# Patient Record
Sex: Female | Born: 1955 | Race: White | Hispanic: No | Marital: Married | Smoking: Former smoker
Health system: Southern US, Community
[De-identification: ages and names within clinical notes are randomized; demographics above are authoritative.]

## PROBLEM LIST (undated history)

## (undated) DIAGNOSIS — Z872 Personal history of diseases of the skin and subcutaneous tissue: Secondary | ICD-10-CM

## (undated) DIAGNOSIS — D219 Benign neoplasm of connective and other soft tissue, unspecified: Secondary | ICD-10-CM

## (undated) DIAGNOSIS — B009 Herpesviral infection, unspecified: Secondary | ICD-10-CM

## (undated) DIAGNOSIS — M1990 Unspecified osteoarthritis, unspecified site: Secondary | ICD-10-CM

## (undated) DIAGNOSIS — K589 Irritable bowel syndrome without diarrhea: Secondary | ICD-10-CM

## (undated) DIAGNOSIS — D229 Melanocytic nevi, unspecified: Secondary | ICD-10-CM

## (undated) DIAGNOSIS — N3010 Interstitial cystitis (chronic) without hematuria: Secondary | ICD-10-CM

## (undated) DIAGNOSIS — R7303 Prediabetes: Secondary | ICD-10-CM

## (undated) DIAGNOSIS — C801 Malignant (primary) neoplasm, unspecified: Secondary | ICD-10-CM

## (undated) DIAGNOSIS — Z87442 Personal history of urinary calculi: Secondary | ICD-10-CM

## (undated) DIAGNOSIS — T7840XA Allergy, unspecified, initial encounter: Secondary | ICD-10-CM

## (undated) DIAGNOSIS — N200 Calculus of kidney: Secondary | ICD-10-CM

## (undated) DIAGNOSIS — G43909 Migraine, unspecified, not intractable, without status migrainosus: Secondary | ICD-10-CM

## (undated) DIAGNOSIS — K759 Inflammatory liver disease, unspecified: Secondary | ICD-10-CM

## (undated) DIAGNOSIS — K602 Anal fissure, unspecified: Secondary | ICD-10-CM

## (undated) DIAGNOSIS — F419 Anxiety disorder, unspecified: Secondary | ICD-10-CM

## (undated) DIAGNOSIS — E785 Hyperlipidemia, unspecified: Secondary | ICD-10-CM

## (undated) DIAGNOSIS — K219 Gastro-esophageal reflux disease without esophagitis: Secondary | ICD-10-CM

## (undated) HISTORY — DX: Calculus of kidney: N20.0

## (undated) HISTORY — DX: Anxiety disorder, unspecified: F41.9

## (undated) HISTORY — DX: Personal history of diseases of the skin and subcutaneous tissue: Z87.2

## (undated) HISTORY — DX: Hyperlipidemia, unspecified: E78.5

## (undated) HISTORY — DX: Melanocytic nevi, unspecified: D22.9

## (undated) HISTORY — DX: Herpesviral infection, unspecified: B00.9

## (undated) HISTORY — PX: LITHOTRIPSY: SUR834

## (undated) HISTORY — PX: UPPER GASTROINTESTINAL ENDOSCOPY: SHX188

## (undated) HISTORY — PX: BLADDER REPAIR: SHX76

## (undated) HISTORY — DX: Prediabetes: R73.03

## (undated) HISTORY — DX: Gastro-esophageal reflux disease without esophagitis: K21.9

## (undated) HISTORY — DX: Benign neoplasm of connective and other soft tissue, unspecified: D21.9

## (undated) HISTORY — PX: LAPAROSCOPY: SHX197

## (undated) HISTORY — DX: Unspecified osteoarthritis, unspecified site: M19.90

## (undated) HISTORY — DX: Interstitial cystitis (chronic) without hematuria: N30.10

## (undated) HISTORY — DX: Allergy, unspecified, initial encounter: T78.40XA

## (undated) HISTORY — PX: OTHER SURGICAL HISTORY: SHX169

## (undated) HISTORY — PX: COLONOSCOPY: SHX174

## (undated) HISTORY — PX: BUNIONECTOMY: SHX129

## (undated) HISTORY — DX: Migraine, unspecified, not intractable, without status migrainosus: G43.909

## (undated) HISTORY — DX: Irritable bowel syndrome, unspecified: K58.9

## (undated) HISTORY — DX: Anal fissure, unspecified: K60.2

## (undated) HISTORY — PX: CHOLECYSTECTOMY: SHX55

## (undated) HISTORY — PX: TONSILLECTOMY: SUR1361

---

## 1990-04-26 ENCOUNTER — Encounter: Payer: Self-pay | Admitting: Internal Medicine

## 1990-11-13 ENCOUNTER — Encounter: Payer: Self-pay | Admitting: Internal Medicine

## 1990-11-15 ENCOUNTER — Encounter: Payer: Self-pay | Admitting: Internal Medicine

## 1991-09-11 ENCOUNTER — Encounter: Payer: Self-pay | Admitting: Internal Medicine

## 1991-10-19 ENCOUNTER — Encounter: Payer: Self-pay | Admitting: Internal Medicine

## 1991-10-26 ENCOUNTER — Encounter: Payer: Self-pay | Admitting: Internal Medicine

## 1992-03-26 ENCOUNTER — Encounter: Payer: Self-pay | Admitting: Internal Medicine

## 1994-01-28 ENCOUNTER — Encounter: Payer: Self-pay | Admitting: Internal Medicine

## 1994-02-08 ENCOUNTER — Encounter: Payer: Self-pay | Admitting: Internal Medicine

## 1994-02-15 ENCOUNTER — Encounter: Payer: Self-pay | Admitting: Internal Medicine

## 1995-01-05 ENCOUNTER — Encounter: Payer: Self-pay | Admitting: Internal Medicine

## 1995-01-21 ENCOUNTER — Encounter: Payer: Self-pay | Admitting: Internal Medicine

## 1995-02-24 ENCOUNTER — Encounter: Payer: Self-pay | Admitting: Internal Medicine

## 1995-03-14 ENCOUNTER — Encounter: Payer: Self-pay | Admitting: Internal Medicine

## 1995-04-08 ENCOUNTER — Encounter: Payer: Self-pay | Admitting: Internal Medicine

## 1996-05-30 ENCOUNTER — Encounter: Payer: Self-pay | Admitting: Internal Medicine

## 1996-08-30 ENCOUNTER — Encounter: Payer: Self-pay | Admitting: Internal Medicine

## 1997-07-18 ENCOUNTER — Encounter: Payer: Self-pay | Admitting: Internal Medicine

## 1997-08-26 ENCOUNTER — Encounter: Payer: Self-pay | Admitting: Internal Medicine

## 1997-10-30 ENCOUNTER — Encounter: Payer: Self-pay | Admitting: Internal Medicine

## 1997-11-07 ENCOUNTER — Encounter: Payer: Self-pay | Admitting: Internal Medicine

## 1997-11-14 ENCOUNTER — Encounter: Payer: Self-pay | Admitting: Internal Medicine

## 1997-11-25 ENCOUNTER — Encounter: Payer: Self-pay | Admitting: Internal Medicine

## 1999-07-08 ENCOUNTER — Encounter: Payer: Self-pay | Admitting: Obstetrics and Gynecology

## 1999-07-08 ENCOUNTER — Encounter: Admission: RE | Admit: 1999-07-08 | Discharge: 1999-07-08 | Payer: Self-pay | Admitting: Obstetrics and Gynecology

## 1999-07-08 ENCOUNTER — Encounter (INDEPENDENT_AMBULATORY_CARE_PROVIDER_SITE_OTHER): Payer: Self-pay | Admitting: *Deleted

## 1999-09-03 ENCOUNTER — Ambulatory Visit (HOSPITAL_COMMUNITY): Admission: RE | Admit: 1999-09-03 | Discharge: 1999-09-03 | Payer: Self-pay | Admitting: *Deleted

## 2001-09-27 ENCOUNTER — Ambulatory Visit (HOSPITAL_COMMUNITY): Admission: RE | Admit: 2001-09-27 | Discharge: 2001-09-27 | Payer: Self-pay | Admitting: Internal Medicine

## 2001-09-27 ENCOUNTER — Encounter: Payer: Self-pay | Admitting: Internal Medicine

## 2001-11-23 ENCOUNTER — Encounter: Payer: Self-pay | Admitting: Internal Medicine

## 2002-06-08 ENCOUNTER — Encounter (INDEPENDENT_AMBULATORY_CARE_PROVIDER_SITE_OTHER): Payer: Self-pay | Admitting: *Deleted

## 2002-06-08 ENCOUNTER — Ambulatory Visit (HOSPITAL_COMMUNITY): Admission: RE | Admit: 2002-06-08 | Discharge: 2002-06-08 | Payer: Self-pay | Admitting: Gastroenterology

## 2002-10-02 ENCOUNTER — Ambulatory Visit (HOSPITAL_COMMUNITY): Admission: RE | Admit: 2002-10-02 | Discharge: 2002-10-02 | Payer: Self-pay | Admitting: Internal Medicine

## 2002-10-02 ENCOUNTER — Encounter: Payer: Self-pay | Admitting: Internal Medicine

## 2003-10-09 ENCOUNTER — Ambulatory Visit (HOSPITAL_COMMUNITY): Admission: RE | Admit: 2003-10-09 | Discharge: 2003-10-09 | Payer: Self-pay | Admitting: Internal Medicine

## 2004-10-13 ENCOUNTER — Ambulatory Visit (HOSPITAL_COMMUNITY): Admission: RE | Admit: 2004-10-13 | Discharge: 2004-10-13 | Payer: Self-pay | Admitting: Internal Medicine

## 2005-10-28 ENCOUNTER — Ambulatory Visit (HOSPITAL_COMMUNITY): Admission: RE | Admit: 2005-10-28 | Discharge: 2005-10-28 | Payer: Self-pay | Admitting: Internal Medicine

## 2006-03-23 ENCOUNTER — Ambulatory Visit (HOSPITAL_COMMUNITY): Admission: RE | Admit: 2006-03-23 | Discharge: 2006-03-23 | Payer: Self-pay | Admitting: Internal Medicine

## 2006-11-11 ENCOUNTER — Ambulatory Visit (HOSPITAL_COMMUNITY): Admission: RE | Admit: 2006-11-11 | Discharge: 2006-11-11 | Payer: Self-pay | Admitting: Internal Medicine

## 2007-09-14 ENCOUNTER — Other Ambulatory Visit: Admission: RE | Admit: 2007-09-14 | Discharge: 2007-09-14 | Payer: Self-pay | Admitting: Obstetrics & Gynecology

## 2007-12-06 ENCOUNTER — Ambulatory Visit (HOSPITAL_COMMUNITY): Admission: RE | Admit: 2007-12-06 | Discharge: 2007-12-06 | Payer: Self-pay | Admitting: Internal Medicine

## 2008-09-24 ENCOUNTER — Ambulatory Visit: Payer: Self-pay | Admitting: Internal Medicine

## 2008-09-24 DIAGNOSIS — F411 Generalized anxiety disorder: Secondary | ICD-10-CM | POA: Insufficient documentation

## 2008-09-24 DIAGNOSIS — Z8719 Personal history of other diseases of the digestive system: Secondary | ICD-10-CM | POA: Insufficient documentation

## 2008-09-24 DIAGNOSIS — Z872 Personal history of diseases of the skin and subcutaneous tissue: Secondary | ICD-10-CM | POA: Insufficient documentation

## 2008-09-24 HISTORY — DX: Personal history of diseases of the skin and subcutaneous tissue: Z87.2

## 2008-09-24 HISTORY — DX: Generalized anxiety disorder: F41.1

## 2009-01-25 HISTORY — PX: RETROPUBIC SLING: SHX2343

## 2009-02-06 ENCOUNTER — Ambulatory Visit (HOSPITAL_BASED_OUTPATIENT_CLINIC_OR_DEPARTMENT_OTHER): Admission: RE | Admit: 2009-02-06 | Discharge: 2009-02-06 | Payer: Self-pay | Admitting: Urology

## 2009-10-14 ENCOUNTER — Ambulatory Visit (HOSPITAL_BASED_OUTPATIENT_CLINIC_OR_DEPARTMENT_OTHER): Admission: RE | Admit: 2009-10-14 | Discharge: 2009-10-14 | Payer: Self-pay | Admitting: Urology

## 2010-02-06 ENCOUNTER — Ambulatory Visit
Admission: RE | Admit: 2010-02-06 | Discharge: 2010-02-06 | Payer: Self-pay | Source: Home / Self Care | Attending: Internal Medicine | Admitting: Internal Medicine

## 2010-02-06 DIAGNOSIS — K5900 Constipation, unspecified: Secondary | ICD-10-CM

## 2010-02-06 DIAGNOSIS — R143 Flatulence: Secondary | ICD-10-CM

## 2010-02-06 DIAGNOSIS — R142 Eructation: Secondary | ICD-10-CM

## 2010-02-06 DIAGNOSIS — R141 Gas pain: Secondary | ICD-10-CM | POA: Insufficient documentation

## 2010-02-06 HISTORY — DX: Constipation, unspecified: K59.00

## 2010-02-26 NOTE — Assessment & Plan Note (Signed)
Summary: f/u--ch.   History of Present Illness Visit Type: Follow-up Visit Primary GI MD: Lina Sar MD Primary Provider:  Marisue Brooklyn, DO Requesting Provider: n/a Chief Complaint: Pt c/o constipation and bloating  History of Present Illness:   This is a 55 year old white female with irritable bowel syndrome. Her last appointment was in August 2010. She has been symptomatically improved on Paxil 10 mg a day. However, she has gained about 8 pounds. Her main complaint is constipation for which she takes stool softeners around 3 times a week. She denies rectal bleeding. Her last colonoscopy was in May 2004. A recall colonoscopy will be due in May 2014.   GI Review of Systems    Reports bloating.      Denies abdominal pain, acid reflux, belching, chest pain, dysphagia with liquids, dysphagia with solids, heartburn, loss of appetite, nausea, vomiting, vomiting blood, weight loss, and  weight gain.      Reports constipation.     Denies anal fissure, black tarry stools, change in bowel habit, diarrhea, diverticulosis, fecal incontinence, heme positive stool, hemorrhoids, irritable bowel syndrome, jaundice, light color stool, liver problems, rectal bleeding, and  rectal pain.    Current Medications (verified): 1)  Crestor 10 Mg Tabs (Rosuvastatin Calcium) .... One Tablet By Mouth Everyother Day 2)  Vitamin E 400 Unit Caps (Vitamin E) .... One Tablet By Mouth Once Daily 3)  Vitamin D3 2000 Unit Caps (Cholecalciferol) .... One Tablet By Mouth Once Daily 4)  Levsin/sl 0.125 Mg Subl (Hyoscyamine Sulfate) .... Dissolve 1 Tablet Under The Tongue Every 4 Hours As Needed 5)  Paxil 10 Mg Tabs (Paroxetine Hcl) .... Take 1 Tablet By Mouth Once A Day 6)  Valtrex 500 Mg Tabs (Valacyclovir Hcl) .... 1/2-1 Tablet By Mouth Once Daily 7)  Stool Softener 250 Mg Caps (Docusate Sodium) .... As Needed 8)  Voltaren 1 % Gel (Diclofenac Sodium) .... As Needed 9)  Gelnique 10 % Gel (Oxybutynin Chloride) .... As  Needed  Allergies (verified): 1)  ! Cipro  Past History:  Past Medical History: ABDOMINAL BLOATING (ICD-787.3) CONSTIPATION (ICD-564.00) IRRITABLE BOWEL SYNDROME (ICD-564.1) HYPERLIPIDEMIA (ICD-272.4) HX OF GALLSTONE (ICD-V12.79) ANXIETY (ICD-300.00) ANAL FISSURE, HX OF (ICD-V13.3)  Past Surgical History: Cholecystectomy Tubal Reconstruction Bladder Repair Tonsillectomy  Family History: Family History of Diabetes: PGM Family History of Breast Cancer:PGM Family History of Pancreatic Cancer:MGM Lung Cancer: MGM Family History of Celiac Disease:Mother  Family History of Irritable Bowel Syndrome:Sister  No FH of Colon Cancer:  Social History: Reviewed history from 09/24/2008 and no changes required. Occupation: Optometrist Married 2 girls  Patient is a former smoker.  Alcohol Use - yes: social only  Daily Caffeine Use: 2 caffine  Illicit Drug Use - no  Review of Systems       The patient complains of back pain and fatigue.  The patient denies allergy/sinus, anemia, anxiety-new, arthritis/joint pain, blood in urine, breast changes/lumps, change in vision, confusion, cough, coughing up blood, depression-new, fainting, fever, headaches-new, hearing problems, heart murmur, heart rhythm changes, itching, menstrual pain, muscle pains/cramps, night sweats, nosebleeds, pregnancy symptoms, shortness of breath, skin rash, sleeping problems, sore throat, swelling of feet/legs, swollen lymph glands, thirst - excessive , urination - excessive , urination changes/pain, urine leakage, vision changes, and voice change.         Pertinent positive and negative review of systems were noted in the above HPI. All other ROS was otherwise negative.   Vital Signs:  Patient profile:   55 year old  female Height:      64 inches Weight:      131 pounds BMI:     22.57 BSA:     1.64 Pulse rate:   88 / minute Pulse rhythm:   regular BP sitting:   120 / 64  (left  arm) Cuff size:   regular  Vitals Entered By: Ok Anis CMA (February 06, 2010 3:37 PM)  Physical Exam  General:  Well developed, well nourished, no acute distress. Mouth:  No deformity or lesions, dentition normal. Lungs:  Clear throughout to auscultation. Heart:  Regular rate and rhythm; no murmurs, rubs,  or bruits. Abdomen:  Soft, nontender and nondistended. No masses, hepatosplenomegaly or hernias noted. Normal bowel sounds. Rectal:  formed  Hemoccult-negative stool Extremities:  No clubbing, cyanosis, edema or deformities noted. Skin:  Intact without significant lesions or rashes. Psych:  Alert and cooperative. Normal mood and affect.   Impression & Recommendations:  Problem # 1:  ABDOMINAL BLOATING (ICD-787.3) Patient has irritable bowel syndrome with predominant constipation. She has symptomatically improved on Paxil 10 mg daily. We will refill for another year. Dr. Elisabeth Most may refill her Paxil in the future. She needs to stay on a high-fiber diet. A recall colonoscopy will be due in May 2014.  Problem # 2:  CONSTIPATION (ICD-564.00) Patient should continue stool softener. She may use this on a daily basis. I offered MiraLax. She is to continue stool softeners.  Patient Instructions: 1)  high-fiber diet. 2)  Continue stool softener with a mild stimulant. 3)  As an alternative, he may use MiraLax 9-17 g 3 times a week. 4)  Recall colonoscopy May 2014. 5)  Refill Paxil 10 mg daily. This may be refilled by Dr. Hardie Pulley office in the future. 6)  Copy sent to : Dr A.Stevenson 7)  The medication list was reviewed and reconciled.  All changed / newly prescribed medications were explained.  A complete medication list was provided to the patient / caregiver. Prescriptions: LEVSIN/SL 0.125 MG SUBL (HYOSCYAMINE SULFATE) Dissolve 1 tablet under the tongue every 4 hours as needed  #60 x 11   Entered by:   Lamona Curl CMA (AAMA)   Authorized by:   Hart Carwin MD    Signed by:   Hart Carwin MD on 02/06/2010   Method used:   Electronically to        [facility] [phone]* (retail)       [address]       [place], [place]  [address]       Ph: [phone] or [phone]       Fax: [phone]   RxID:   [id number] PAXIL 10 MG TABS (PAROXETINE HCL) Take 1 tablet by mouth once a day  #30 x 11   Entered by:   Lamona Curl CMA (AAMA)   Authorized by:   Hart Carwin MD   Signed by:   Lamona Curl CMA (AAMA) on 02/06/2010   Method used:   Electronically to        [facility] [phone]* (retail)       [address]       [place], [place]  [address]       Ph: [phone] or [phone]       Fax: [phone]   RxID:   [id number]

## 2010-04-09 LAB — TYPE AND SCREEN
ABO/RH(D): A NEG
Antibody Screen: NEGATIVE

## 2010-04-09 LAB — CBC
HCT: 39.4 % (ref 36.0–46.0)
MCV: 92.3 fL (ref 78.0–100.0)
Platelets: 204 10*3/uL (ref 150–400)
RBC: 4.27 MIL/uL (ref 3.87–5.11)
RDW: 12.9 % (ref 11.5–15.5)

## 2010-04-09 LAB — PROTIME-INR: INR: 1.05 (ref 0.00–1.49)

## 2010-04-12 LAB — POCT PREGNANCY, URINE: Preg Test, Ur: NEGATIVE

## 2011-05-18 ENCOUNTER — Ambulatory Visit (INDEPENDENT_AMBULATORY_CARE_PROVIDER_SITE_OTHER): Payer: 59 | Admitting: Internal Medicine

## 2011-05-18 VITALS — BP 100/62 | HR 59 | Temp 97.9°F | Resp 18 | Ht 63.5 in | Wt 110.6 lb

## 2011-05-18 DIAGNOSIS — R109 Unspecified abdominal pain: Secondary | ICD-10-CM

## 2011-05-18 DIAGNOSIS — K5900 Constipation, unspecified: Secondary | ICD-10-CM

## 2011-05-18 DIAGNOSIS — N390 Urinary tract infection, site not specified: Secondary | ICD-10-CM

## 2011-05-18 LAB — POCT URINALYSIS DIPSTICK
Bilirubin, UA: NEGATIVE
Ketones, UA: NEGATIVE
Nitrite, UA: NEGATIVE
Spec Grav, UA: 1.02
Urobilinogen, UA: 0.2

## 2011-05-18 LAB — POCT UA - MICROSCOPIC ONLY: Mucus, UA: NEGATIVE

## 2011-05-18 MED ORDER — NITROFURANTOIN MONOHYD MACRO 100 MG PO CAPS: 100.0000 mg | ORAL_CAPSULE | Freq: Two times a day (BID) | ORAL | Status: DC

## 2011-05-18 MED ORDER — SULFAMETHOXAZOLE-TRIMETHOPRIM 800-160 MG PO TABS: 1.0000 | ORAL_TABLET | Freq: Two times a day (BID) | ORAL | Status: AC

## 2011-05-18 NOTE — Progress Notes (Signed)
  Subjective:    Patient ID: Patricia Ryan, female    DOB: 1955/12/06, 56 y.o.   MRN: [id number]  HPI burning with urination for 3 days.moderate in severity no back pain.  No nausea of vomiting.  No fever. Started to take septra on her own but now with spasms and some pain in the abdomen after exercising yesterday.  No fever no vomiting  has chronic constipation the last time she had a bm was 1 week ago. Has bladder sling No vaginal discharge.   Review of Systems  Unable to perform ROS Constitutional: Negative.   HENT: Negative.   Eyes: Negative.   Respiratory: Negative.   Cardiovascular: Negative.   Gastrointestinal: Negative.   Genitourinary: Positive for dysuria, urgency, frequency and pelvic pain. Negative for hematuria, flank pain and difficulty urinating.  Musculoskeletal: Negative.   Skin: Negative.   Neurological: Negative.   Hematological: Negative.   Psychiatric/Behavioral: Negative.   All other systems reviewed and are negative.       Objective:   Physical Exam  Nursing note and vitals reviewed. Constitutional: She is oriented to person, place, and time. She appears well-developed and well-nourished.  HENT:  Head: Normocephalic and atraumatic.  Eyes: Conjunctivae and EOM are normal.  Neck: Normal range of motion. Neck supple.  Cardiovascular: Normal rate, regular rhythm and normal heart sounds.   Pulmonary/Chest: Effort normal and breath sounds normal.  Abdominal: Soft. Bowel sounds are normal. She exhibits no distension and no mass. There is tenderness. There is no rebound and no guarding.       Slight tenderness midline pelvic are and rlq but soft and no rebound or guarding  Musculoskeletal: Normal range of motion.  Neurological: She is alert and oriented to person, place, and time.  Skin: Skin is warm and dry.  Psychiatric: She has a normal mood and affect. Her behavior is normal. Judgment and thought content normal.   Urine positive for persistant  uti   Results for orders placed in visit on 05/18/11  POCT URINALYSIS DIPSTICK      Component Value Range   Color, UA yellow     Clarity, UA clear     Glucose, UA neg     Bilirubin, UA neg     Ketones, UA neg     Spec Grav, UA 1.020     Blood, UA trace     pH, UA 5.5     Protein, UA neg     Urobilinogen, UA 0.2     Nitrite, UA neg     Leukocytes, UA Trace    POCT UA - MICROSCOPIC ONLY      Component Value Range   WBC, Ur, HPF, POC 3-4     RBC, urine, microscopic 3-6     Bacteria, U Microscopic trace     Mucus, UA neg     Epithelial cells, urine per micros 0-2     Crystals, Ur, HPF, POC neg     Casts, Ur, LPF, POC neg     Yeast, UA neg        Assessment & Plan:  uti  consttipatin Will check urine and recommend laxative and possible antispasmodic and macrobid as directed

## 2011-05-18 NOTE — Patient Instructions (Addendum)
marcrobid as directed. Continue the azo. Miralax for constipation. If your pain continues return to the [facility]

## 2011-05-18 NOTE — Progress Notes (Signed)
Addended by: Glennie Isle on: 05/18/2011 07:55 PM   Modules accepted: Orders

## 2012-03-06 ENCOUNTER — Telehealth: Payer: Self-pay

## 2012-03-06 NOTE — Telephone Encounter (Signed)
xyz

## 2012-04-05 ENCOUNTER — Encounter: Payer: Self-pay | Admitting: *Deleted

## 2012-04-14 ENCOUNTER — Other Ambulatory Visit (INDEPENDENT_AMBULATORY_CARE_PROVIDER_SITE_OTHER): Payer: 59

## 2012-04-14 ENCOUNTER — Ambulatory Visit (INDEPENDENT_AMBULATORY_CARE_PROVIDER_SITE_OTHER): Payer: 59 | Admitting: Internal Medicine

## 2012-04-14 ENCOUNTER — Encounter: Payer: Self-pay | Admitting: Internal Medicine

## 2012-04-14 VITALS — BP 90/62 | HR 72 | Ht 64.0 in | Wt 127.0 lb

## 2012-04-14 DIAGNOSIS — K589 Irritable bowel syndrome without diarrhea: Secondary | ICD-10-CM

## 2012-04-14 DIAGNOSIS — K3189 Other diseases of stomach and duodenum: Secondary | ICD-10-CM

## 2012-04-14 MED ORDER — MOVIPREP 100 G PO SOLR: 1.0000 | Freq: Once | ORAL | Status: DC

## 2012-04-14 MED ORDER — LINACLOTIDE 290 MCG PO CAPS: 1.0000 | ORAL_CAPSULE | Freq: Every day | ORAL | Status: DC

## 2012-04-14 NOTE — Progress Notes (Signed)
Patricia Ryan [birth year] MRN [id number]  History of Present Illness:  This is a 57 year old white female with  history of irritable bowel syndrome with predominant constipation. 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 Her job is sedentary. She continues to be constipated, complaining of weight gain from 115 to a current 127 pounds. She takes stool softeners but is afraid to take them every day. She denies rectal bleeding. Patient had a colonoscopy in May 2004 by Dr.Weissman which was a normal exam. She is due for a recall colonoscopy in May of this year. Her mother has celiac disease. Patient underwent a laparoscopic cholecystectomy in 1992 and for brief period of time, had diarrhea but then she reverted to constipation again. She had a positive H. pylori antibody and was treated for 10 days with clarithromycin, amoxicillin and PPI. She is currently taking Protonix 40 mg daily with improvement in her symptoms of dyspepsia.    Past Medical History  Diagnosis Date  . Hyperlipidemia   . GERD (gastroesophageal reflux disease)   . IBS (irritable bowel syndrome)   . Migraines   . Diabetes   . HSV-2 (herpes simplex virus 2) infection   . Anal fissure   . Anxiety    Past Surgical History  Procedure Laterality Date  . Cholecystectomy    . Tubal reconstruction    . Bladder repair    . Tonsillectomy      reports that she quit smoking about 3 months ago. Her smoking use included Cigarettes. She smoked 0.00 packs per day. She has never used smokeless tobacco. She reports that  drinks alcohol. She reports that she does not use illicit drugs. family history includes Breast cancer in her paternal grandmother; Celiac disease in her mother; Diabetes in her paternal grandmother; Heart disease in her father; Irritable bowel syndrome in her sister; Kidney disease in an unspecified family member; Lung cancer in her maternal  grandmother; and Pancreatic cancer in her maternal grandmother.  There is no history of Colon cancer. Allergies  Allergen Reactions  . Ciprofloxacin         Review of Systems:Negative for dysphagia rectal bleeding abdominal pain   The remainder of the 10 point ROS is negative except as outlined in H&P   Physical Exam: General appearance  Well developed, in no distress. Eyes- non icteric. HEENT nontraumatic, normocephalic. Mouth no lesions, tongue papillated, no cheilosis. Neck supple without adenopathy, thyroid not enlarged, no carotid bruits, no JVD. Lungs Clear to auscultation bilaterally. Cor normal S1, normal S2, regular rhythm, no murmur,  quiet precordium. Abdomen:  soft nontender. Normoactive bowel sounds. No tympany. No distention.  Rectal: not done.  Extremities no pedal edema. Skin no lesions. Neurological alert and oriented x 3. Psychological normal mood and affect.  Assessment and Plan:  Problem #47 57 year old white female with irritable bowel syndrome with predominant constipation. I think her symptoms are related to IBS. She has just completed H. pylori treatment and this is symptomatically improved on PPI. She is due for recall colonoscopy. 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 We have discussed a high-fiber diet and I gave her samples of Linzess 290 mcg to take daily. Because it is expensive, I gave her samples and she will let us know if it  helps and we would send her prescription.   Problem #2 Positive H.pylori antibody, treated with triple therapy, question as to whether her symptoms were caused by H. Pylori. R/O bile gastritis, r/o non ulcer dyspepsia, r/o sprue- obtain sprue profile and Ig A   04/14/2012 Lina Sar

## 2012-04-14 NOTE — Patient Instructions (Addendum)
You have been scheduled for an endoscopy and colonoscopy with propofol. Please follow the written instructions given to you at your visit today. Please pick up your prep at the pharmacy within the next 1-3 days. If you use inhalers (even only as needed), please bring them with you on the day of your procedure.  Your physician has requested that you go to the basement for the following lab work before leaving today: Celiac 10, TSH, Sed Rate  CC: Dr Oneta Rack

## 2012-04-17 LAB — CELIAC PANEL 10
Endomysial Screen: NEGATIVE
IgA: 98 mg/dL (ref 69–380)
Tissue Transglut Ab: 4.1 U/mL (ref <20)

## 2012-04-20 ENCOUNTER — Encounter: Payer: Self-pay | Admitting: Family Medicine

## 2012-04-20 ENCOUNTER — Ambulatory Visit (INDEPENDENT_AMBULATORY_CARE_PROVIDER_SITE_OTHER): Payer: 59 | Admitting: Family Medicine

## 2012-04-20 VITALS — BP 138/62 | HR 76 | Temp 98.4°F | Resp 18 | Ht 63.75 in | Wt 126.0 lb

## 2012-04-20 DIAGNOSIS — R319 Hematuria, unspecified: Secondary | ICD-10-CM

## 2012-04-20 DIAGNOSIS — N390 Urinary tract infection, site not specified: Secondary | ICD-10-CM

## 2012-04-20 DIAGNOSIS — R300 Dysuria: Secondary | ICD-10-CM

## 2012-04-20 DIAGNOSIS — L509 Urticaria, unspecified: Secondary | ICD-10-CM

## 2012-04-20 DIAGNOSIS — Z888 Allergy status to other drugs, medicaments and biological substances status: Secondary | ICD-10-CM

## 2012-04-20 DIAGNOSIS — T7840XA Allergy, unspecified, initial encounter: Secondary | ICD-10-CM

## 2012-04-20 LAB — POCT UA - MICROSCOPIC ONLY: Mucus, UA: POSITIVE

## 2012-04-20 LAB — POCT URINALYSIS DIPSTICK
Bilirubin, UA: NEGATIVE
Ketones, UA: NEGATIVE
Leukocytes, UA: NEGATIVE
Spec Grav, UA: 1.025

## 2012-04-20 MED ORDER — SULFAMETHOXAZOLE-TRIMETHOPRIM 800-160 MG PO TABS: 1.0000 | ORAL_TABLET | Freq: Two times a day (BID) | ORAL | Status: DC

## 2012-04-20 MED ORDER — METHYLPREDNISOLONE SODIUM SUCC 125 MG IJ SOLR
125.0000 mg | Freq: Once | INTRAMUSCULAR | Status: AC
  Administered 2012-04-20: 125 mg via INTRAVENOUS

## 2012-04-20 MED ORDER — AMOXICILLIN-POT CLAVULANATE 875-125 MG PO TABS: 1.0000 | ORAL_TABLET | Freq: Two times a day (BID) | ORAL | Status: DC

## 2012-04-20 MED ORDER — DIPHENHYDRAMINE HCL 50 MG/ML IJ SOLN
25.0000 mg | Freq: Once | INTRAMUSCULAR | Status: AC
  Administered 2012-04-20: 25 mg via INTRAMUSCULAR

## 2012-04-20 NOTE — Progress Notes (Addendum)
  Subjective:    Patient ID: Patricia Ryan, female    DOB: 09/03/1955, 57 y.o.   MRN: [id number]  HPI See prior note -  Patient pulled back emergently due to allergic reaction:  Went to pharmacy - started Septra - 10 minutes after taking 1st pill noticed not feeling right - red rash on stomach, back, and arms with itching.  Sensation in throat, but no difficulty swallowing or clearing secretions.  No syncope. Took 1 benadryl by mouth just prior to arrival here at 1015.   Review of Systems  Respiratory: Negative for shortness of breath and wheezing.    As above.     Objective:   Physical Exam  Vitals reviewed. Constitutional: She is oriented to person, place, and time. She appears well-developed and well-nourished.  Appears anxious, but speaking normally, nontoxic.   HENT:  Head: Normocephalic and atraumatic.  Right Ear: External ear normal.  Left Ear: External ear normal.  Mouth/Throat: Uvula is midline, oropharynx is clear and moist and mucous membranes are normal. No oropharyngeal exudate, posterior oropharyngeal edema or posterior oropharyngeal erythema.  No mucosal or tongue edema.   Neck: No tracheal deviation present.  Cardiovascular: Normal rate, regular rhythm, normal heart sounds and intact distal pulses.   Pulmonary/Chest: Effort normal and breath sounds normal. No accessory muscle usage or stridor. Not tachypneic. No respiratory distress. She has no wheezes. She has no rales.  Neurological: She is alert and oriented to person, place, and time.  Skin: Skin is warm, dry and intact. Rash noted. Rash is macular. She is not diaphoretic.     Erythematous patches/macules.  with urticarial lesions.   Psychiatric: She has a normal mood and affect. Her behavior is normal.       Assessment & Plan:  Patricia Ryan is a 57 y.o. female Allergic reaction to Septra - unknown prior allergy.  S/p Benadryl 25mg  po prior to arrival.  Additional 25mg  IM benadryl given at 1025, IV  placed for access, and Solumedrol 125mg  IV at 1035.  1045: no stridor, lungs clear, minimal change, no worsening - cont to monitor.  1115: erythematous rash on trunk persists, no wheeze/stridor.  No dypnea or throat sx's. IV in place.  1155: faint erythema on back, less apparent on abdomen, no trouble drinking or breathing. Zantac 150mg  pox1.  Discussed home care, and stable for discharge home.  Continue benadryl 50mg  every 4 hours today (next dose due at 2:15pm), continue zantac 150mg  twice per day for next 5 days, switch from benadryl to zyrtec 10mg  once per day starting tomorrow morning if improving and take these for 5 days as well.  Recheck in ER or call 911 if any throat symptoms, dificulty breathing, worsening rash, or ulcers/rash in mouth or vagina. Understanding expressed  - precautions as below.  Rtc/er precautions and advised on Sulfa allergy now.  Will rx augmentin 875mg  BID for UTI based on other allergies, and urine cx pending. Continue to drink fluids as discussed prior.

## 2012-04-20 NOTE — Addendum Note (Signed)
Addended by: Maryann Alar on: 04/20/2012 10:52 AM   Modules accepted: Orders, Level of Service

## 2012-04-20 NOTE — Addendum Note (Signed)
Addended by: Meredith Staggers R on: 04/20/2012 12:22 PM   Modules accepted: Orders, Level of Service

## 2012-04-20 NOTE — Progress Notes (Signed)
Subjective:    Patient ID: Patricia Ryan, female    DOB: February 28, 1955, 57 y.o.   MRN: [id number]  HPI Patricia Ryan is a 57 y.o. female  Started last night with discomfort feeling with urinating.  Hx of bladder sling and interstitial cystitis.  Has urologist - last seen about a year half ago.  No attempted treatments. Has azo at home, but has not taken. Has been on hormone therapy past 2 weeks. Does note some vaginal dryness.   No fever, n/v or back pain.    Review of Systems  Constitutional: Negative for fever and chills.  Gastrointestinal: Positive for abdominal pain (lower abdominal sensation. ). Negative for nausea and vomiting.       Lower abdomen/bladder area  Genitourinary: Positive for dysuria, urgency and frequency. Negative for hematuria, vaginal bleeding, vaginal discharge, difficulty urinating and vaginal pain.  Musculoskeletal: Negative for back pain.       Objective:   Physical Exam  Vitals reviewed. Constitutional: She is oriented to person, place, and time. She appears well-developed and well-nourished.  HENT:  Head: Normocephalic and atraumatic.  Pulmonary/Chest: Effort normal.  Abdominal: Soft. Normal appearance. She exhibits no distension. There is no tenderness. There is no rebound, no guarding and no CVA tenderness.  Neurological: She is alert and oriented to person, place, and time.  Skin: Skin is warm.  Psychiatric: She has a normal mood and affect. Her behavior is normal.   Results for orders placed in visit on 04/20/12  POCT URINALYSIS DIPSTICK      Result Value Range   Color, UA yellow     Clarity, UA clear     Glucose, UA neg     Bilirubin, UA neg     Ketones, UA neg     Spec Grav, UA 1.025     Blood, UA mod     pH, UA 5.5     Protein, UA neg     Urobilinogen, UA 0.2     Nitrite, UA neg     Leukocytes, UA Negative    POCT UA - MICROSCOPIC ONLY      Result Value Range   WBC, Ur, HPF, POC 1-2     RBC, urine, microscopic 4-11     Bacteria, U Microscopic trace     Mucus, UA pos     Epithelial cells, urine per micros 1-2     Crystals, Ur, HPF, POC neg     Casts, Ur, LPF, POC neg     Yeast, UA neg         Assessment & Plan:  Patricia Ryan is a 57 y.o. female Dysuria - Plan: POCT urinalysis dipstick, POCT UA - Microscopic Only, Urine culture, sulfamethoxazole-trimethoprim (BACTRIM DS,SEPTRA DS) 800-160 MG per tablet  UTI (urinary tract infection) - Plan: Urine culture, sulfamethoxazole-trimethoprim (BACTRIM DS,SEPTRA DS) 800-160 MG per tablet  Hematuria - Plan: Urine culture, sulfamethoxazole-trimethoprim (BACTRIM DS,SEPTRA DS) 800-160 MG per tablet  Suspected hemorrhagic cystitis  - early. Sx care with azo and fluids, septra DS x 3 days. Urine cx.    Hx of vaginal dryness - likley atrophic vaginitis. Just started estrace, but would consider topical tx if not improving.   Meds ordered this encounter  Medications  . sulfamethoxazole-trimethoprim (BACTRIM DS,SEPTRA DS) 800-160 MG per tablet    Sig: Take 1 tablet by mouth 2 (two) times daily.    Dispense:  6 tablet    Refill:  0     Patient Instructions  Drink fluids,  take Septra as discussed. Azo is ok to take. Return to the clinic or go to the nearest emergency room if any of your symptoms worsen or new symptoms occur. If dryness not improving in next few weeks - can follow up to discuss other treatment options.  Urinary Tract Infection Urinary tract infections (UTIs) can develop anywhere along your urinary tract. Your urinary tract is your body's drainage system for removing wastes and extra water. Your urinary tract includes two kidneys, two ureters, a bladder, and a urethra. Your kidneys are a pair of bean-shaped organs. Each kidney is about the size of your fist. They are located below your ribs, one on each side of your spine. CAUSES Infections are caused by microbes, which are microscopic organisms, including fungi, viruses, and bacteria. These  organisms are so small that they can only be seen through a microscope. Bacteria are the microbes that most commonly cause UTIs. SYMPTOMS  Symptoms of UTIs may vary by age and gender of the patient and by the location of the infection. Symptoms in young women typically include a frequent and intense urge to urinate and a painful, burning feeling in the bladder or urethra during urination. Older women and men are more likely to be tired, shaky, and weak and have muscle aches and abdominal pain. A fever may mean the infection is in your kidneys. Other symptoms of a kidney infection include pain in your back or sides below the ribs, nausea, and vomiting. DIAGNOSIS To diagnose a UTI, your caregiver will ask you about your symptoms. Your caregiver also will ask to provide a urine sample. The urine sample will be tested for bacteria and white blood cells. White blood cells are made by your body to help fight infection. TREATMENT  Typically, UTIs can be treated with medication. Because most UTIs are caused by a bacterial infection, they usually can be treated with the use of antibiotics. The choice of antibiotic and length of treatment depend on your symptoms and the type of bacteria causing your infection. HOME CARE INSTRUCTIONS  If you were prescribed antibiotics, take them exactly as your caregiver instructs you. Finish the medication even if you feel better after you have only taken some of the medication.  Drink enough water and fluids to keep your urine clear or pale yellow.  Avoid caffeine, tea, and carbonated beverages. They tend to irritate your bladder.  Empty your bladder often. Avoid holding urine for long periods of time.  Empty your bladder before and after sexual intercourse.  After a bowel movement, women should cleanse from front to back. Use each tissue only once. SEEK MEDICAL CARE IF:   You have back pain.  You develop a fever.  Your symptoms do not begin to resolve within 3  days. SEEK IMMEDIATE MEDICAL CARE IF:   You have severe back pain or lower abdominal pain.  You develop chills.  You have nausea or vomiting.  You have continued burning or discomfort with urination. MAKE SURE YOU:   Understand these instructions.  Will watch your condition.  Will get help right away if you are not doing well or get worse. Document Released: 10/21/2004 Document Revised: 07/13/2011 Document Reviewed: 02/19/2011 [facility] Patient Information 2013 [place], [place].

## 2012-04-20 NOTE — Addendum Note (Signed)
Addended by: Maryann Alar on: 04/20/2012 10:23 AM   Modules accepted: Orders

## 2012-04-20 NOTE — Patient Instructions (Addendum)
STOP septra and avoid any sulfa medicines in the future. Continue benadryl 50mg  every 4 hours today (next dose due at 2:15pm), continue zantac 150mg  twice per day for next 5 days, switch from benadryl to zyrtec 10mg  once per day starting tomorrow morning if improving and take these for 5 days as well.  Recheck in ER or call 911 if any throat symptoms, dificulty breathing, worsening rash, or ulcers/rash in mouth or vagina. Will prescribe augmentin 875mg  based on other allergies, and urine culture pending. Continue to drink fluids as discussed prior.   Drink fluids, take Augmentin (NOT septra) for urinary infection.  Azo is ok to take over the counter if needed.  Return to the clinic or go to the nearest emergency room if any of your symptoms worsen or new symptoms occur. If dryness not improving in next few weeks - can follow up to discuss other treatment options.   Allergic Reaction, Mild to Moderate Allergies may happen from anything your body is sensitive to. This may be food, medications, pollens, chemicals, and nearly anything around you in everyday life that produces allergens. An allergen is anything that causes an allergy producing substance. Allergens cause your body to release allergic antibodies. Through a chain of events, they cause a release of histamine into the blood stream. Histamines are meant to protect you, but they also cause your discomfort. This is why antihistamines are often used for allergies. Heredity is often a factor in causing allergic reactions. This means you may have some of the same allergies as your parents. Allergies happen in all age groups. You may have some idea of what caused your reaction. There are many allergens around [place]. It may be difficult to know what caused your reaction. If this is a first time event, it may never happen again. Allergies cannot be cured but can be controlled with medications. SYMPTOMS  You may get some or all of the following problems from  allergies.  Swelling and itching in and around the mouth.   Tearing, itchy eyes.   Nasal congestion and runny nose.   Sneezing and coughing.   An itchy red rash or hives.   Vomiting or diarrhea.   Difficulty breathing.  Seasonal allergies occur in all age groups. They are seasonal because they usually occur during the same season every year. They may be a reaction to molds, grass pollens, or tree pollens. Other causes of allergies are house dust mite allergens, pet dander and mold spores. These are just a common few of the thousands of allergens around [place]. All of the symptoms listed above happen when you come in contact with pollens and other allergens. Seasonal allergies are usually not life threatening. They are generally more of a nuisance that can often be handled using medications. Hay fever is a combination of all or some of the above listed allergy problems. It may often be treated with simple over-the-counter medications such as diphenhydramine. Take medication as directed. Check with your caregiver or package insert for child dosages. TREATMENT AND HOME CARE INSTRUCTIONS If hives or rash are present:  Take medications as directed.   You may use an over-the-counter antihistamine (diphenhydramine) for hives and itching as needed. Do not drive or drink alcohol until medications used to treat the reaction have worn off. Antihistamines tend to make people sleepy.   Apply cold cloths (compresses) to the skin or take baths in cool water. This will help itching. Avoid hot baths or showers. Heat will make a rash and  itching worse.   If your allergies persist and become more severe, and over the counter medications are not effective, there are many new medications your caretaker can prescribe. Immunotherapy or desensitizing injections can be used if all else fails. Follow up with your caregiver if problems continue.  SEEK MEDICAL CARE IF:   Your allergies are becoming progressively more  troublesome.   You suspect a food allergy. Symptoms generally happen within 30 minutes of eating a food.   Your symptoms have not gone away within 2 days or are getting worse.   You develop new symptoms.   You want to retest yourself or your child with a food or drink you think causes an allergic reaction. Never test yourself or your child of a suspected allergy without being under the watchful eye of your caregivers. A second exposure to an allergen may be life-threatening.  SEEK IMMEDIATE MEDICAL CARE IF:  You develop difficulty breathing or wheezing, or have a tight feeling in your chest or throat.   You develop a swollen mouth, hives, swelling, or itching all over your body.  A severe reaction with any of the above problems should be considered life-threatening. If you suddenly develop difficulty breathing call for local emergency medical help. THIS IS AN EMERGENCY. MAKE SURE YOU:   Understand these instructions.   Will watch your condition.   Will get help right away if you are not doing well or get worse.  Document Released: 11/08/2006 Document Revised: 12/31/2010 Document Reviewed: 11/08/2006 [facility] Patient Information 2012 [place], [place].   Urinary Tract Infection Urinary tract infections (UTIs) can develop anywhere along your urinary tract. Your urinary tract is your body's drainage system for removing wastes and extra water. Your urinary tract includes two kidneys, two ureters, a bladder, and a urethra. Your kidneys are a pair of bean-shaped organs. Each kidney is about the size of your fist. They are located below your ribs, one on each side of your spine. CAUSES Infections are caused by microbes, which are microscopic organisms, including fungi, viruses, and bacteria. These organisms are so small that they can only be seen through a microscope. Bacteria are the microbes that most commonly cause UTIs. SYMPTOMS  Symptoms of UTIs may vary by age and gender of the patient  and by the location of the infection. Symptoms in young women typically include a frequent and intense urge to urinate and a painful, burning feeling in the bladder or urethra during urination. Older women and men are more likely to be tired, shaky, and weak and have muscle aches and abdominal pain. A fever may mean the infection is in your kidneys. Other symptoms of a kidney infection include pain in your back or sides below the ribs, nausea, and vomiting. DIAGNOSIS To diagnose a UTI, your caregiver will ask you about your symptoms. Your caregiver also will ask to provide a urine sample. The urine sample will be tested for bacteria and white blood cells. White blood cells are made by your body to help fight infection. TREATMENT  Typically, UTIs can be treated with medication. Because most UTIs are caused by a bacterial infection, they usually can be treated with the use of antibiotics. The choice of antibiotic and length of treatment depend on your symptoms and the type of bacteria causing your infection. HOME CARE INSTRUCTIONS  If you were prescribed antibiotics, take them exactly as your caregiver instructs you. Finish the medication even if you feel better after you have only taken some of the medication.  Drink enough water and fluids to keep your urine clear or pale yellow.  Avoid caffeine, tea, and carbonated beverages. They tend to irritate your bladder.  Empty your bladder often. Avoid holding urine for long periods of time.  Empty your bladder before and after sexual intercourse.  After a bowel movement, women should cleanse from front to back. Use each tissue only once. SEEK MEDICAL CARE IF:   You have back pain.  You develop a fever.  Your symptoms do not begin to resolve within 3 days. SEEK IMMEDIATE MEDICAL CARE IF:   You have severe back pain or lower abdominal pain.  You develop chills.  You have nausea or vomiting.  You have continued burning or discomfort with  urination. MAKE SURE YOU:   Understand these instructions.  Will watch your condition.  Will get help right away if you are not doing well or get worse. Document Released: 10/21/2004 Document Revised: 07/13/2011 Document Reviewed: 02/19/2011 [facility] Patient Information 2013 [place], [place].

## 2012-04-26 ENCOUNTER — Telehealth: Payer: Self-pay | Admitting: Obstetrics & Gynecology

## 2012-04-26 NOTE — Telephone Encounter (Signed)
Pt called in regards to a continuing issue. She is being treated for UTI via Urgent care. They told her on Sunday to stop the antibiotic because her culture did not grow any bacteria. She continued to take it due to still having symptoms. She is still having symptoms and is certain something is wrong. She wants to know if she needs to be seen by her urologist or if she should come in here. Please advise.

## 2012-04-26 NOTE — Telephone Encounter (Signed)
Patient is still having UTI symptoms @ having Tx from Urgent care and taking all of medications. Appt. Given for 04/27/12 with Dr. Hyacinth Meeker @ 3:30pm./sue

## 2012-04-27 ENCOUNTER — Encounter: Payer: Self-pay | Admitting: Obstetrics & Gynecology

## 2012-04-27 ENCOUNTER — Ambulatory Visit: Payer: 59 | Admitting: Obstetrics & Gynecology

## 2012-04-27 ENCOUNTER — Ambulatory Visit (INDEPENDENT_AMBULATORY_CARE_PROVIDER_SITE_OTHER): Payer: 59 | Admitting: Obstetrics & Gynecology

## 2012-04-27 VITALS — BP 112/68 | HR 60 | Temp 96.7°F

## 2012-04-27 DIAGNOSIS — R3915 Urgency of urination: Secondary | ICD-10-CM

## 2012-04-27 DIAGNOSIS — N390 Urinary tract infection, site not specified: Secondary | ICD-10-CM

## 2012-04-27 LAB — POCT URINALYSIS DIPSTICK
Bilirubin, UA: NEGATIVE
Glucose, UA: NEGATIVE
Ketones, UA: NEGATIVE

## 2012-04-27 MED ORDER — PHENAZOPYRIDINE HCL 100 MG PO TABS: 100.0000 mg | ORAL_TABLET | Freq: Three times a day (TID) | ORAL | Status: DC | PRN

## 2012-04-27 MED ORDER — ESTRADIOL 0.1 MG/GM VA CREA: 2.0000 g | TOPICAL_CREAM | Freq: Every day | VAGINAL | Status: DC

## 2012-04-27 NOTE — Progress Notes (Signed)
S:  57 y.o. Divorced W female presents with UTI symptoms of urinary frequency, and occasional back pain. Onset of symptoms for weeks. Pertinent negatives include The patient is having no constitutional symptoms, denying fever, chills, anorexia, or weight loss..  Symptoms related to post coital No Current method of birth control menopausal.  Same partner without change. H/O I.C.  Last visit with Dr. Myrtice Lauth was frustrating for patient.  May want to see someone else.  Recently started estradiol or hormone replacement therapy.  ROS: no weight loss, fever, night sweats and feels well  O alert, oriented to person, place, and time    not in acute distress  GYN:  NAEFG, Vaginal normal,  Uterus anteflexed without any masses, adnexa normal, bladder normal  No CVA tenderness    Diagnostic Test:    Urinalysis Clear   urine culture  Assessment: urinary urgency, possible hormonally related vs I.C.   Medication Therapy: pyridium and estrace externally   Urine Culture   May need CT to R/O stones   Pt will call and give update Monday.   May need referral to Dr. Eudelia Bunch.  Patient is given AVS

## 2012-04-27 NOTE — Patient Instructions (Signed)
Call to give me and update on Monday.

## 2012-04-28 LAB — URINE CULTURE
Colony Count: NO GROWTH
Organism ID, Bacteria: NO GROWTH

## 2012-07-11 ENCOUNTER — Telehealth: Payer: Self-pay | Admitting: Obstetrics & Gynecology

## 2012-07-11 NOTE — Telephone Encounter (Signed)
Post menopausal light bleeding, patient concerned. Please advise?

## 2012-07-11 NOTE — Telephone Encounter (Signed)
Patient concerned has had some pinkish discharge off and on now when wipe after urination. Patient states she is menopausal and concerned. Appointment given for Thursday June 19th @ 8:30am with Dr.Miller . Patient having  A colonoscopy on wed June 18th reason she could not come tomorrow.sue

## 2012-07-12 ENCOUNTER — Ambulatory Visit (AMBULATORY_SURGERY_CENTER): Payer: 59 | Admitting: Internal Medicine

## 2012-07-12 ENCOUNTER — Encounter: Payer: Self-pay | Admitting: *Deleted

## 2012-07-12 ENCOUNTER — Encounter: Payer: Self-pay | Admitting: Internal Medicine

## 2012-07-12 VITALS — BP 122/67 | HR 56 | Temp 98.2°F | Resp 14 | Ht 64.0 in | Wt 127.0 lb

## 2012-07-12 DIAGNOSIS — D1330 Benign neoplasm of unspecified part of small intestine: Secondary | ICD-10-CM

## 2012-07-12 DIAGNOSIS — Z8719 Personal history of other diseases of the digestive system: Secondary | ICD-10-CM

## 2012-07-12 DIAGNOSIS — R1013 Epigastric pain: Secondary | ICD-10-CM

## 2012-07-12 DIAGNOSIS — K319 Disease of stomach and duodenum, unspecified: Secondary | ICD-10-CM

## 2012-07-12 DIAGNOSIS — Z1211 Encounter for screening for malignant neoplasm of colon: Secondary | ICD-10-CM

## 2012-07-12 DIAGNOSIS — K3189 Other diseases of stomach and duodenum: Secondary | ICD-10-CM

## 2012-07-12 DIAGNOSIS — D126 Benign neoplasm of colon, unspecified: Secondary | ICD-10-CM

## 2012-07-12 DIAGNOSIS — K589 Irritable bowel syndrome without diarrhea: Secondary | ICD-10-CM

## 2012-07-12 MED ORDER — SODIUM CHLORIDE 0.9 % IV SOLN: 500.0000 mL | INTRAVENOUS | Status: DC

## 2012-07-12 NOTE — Progress Notes (Signed)
Patient did not experience any of the following events: a burn prior to discharge; a fall within the facility; wrong site/side/patient/procedure/implant event; or a hospital transfer or hospital admission upon discharge from the facility. (G8907) Patient did not have preoperative order for IV antibiotic SSI prophylaxis. (G8918)  

## 2012-07-12 NOTE — Op Note (Signed)
[facility] [address] [place], [address]   ENDOSCOPY PROCEDURE REPORT  PATIENT: Patricia Ryan, Patricia Ryan  MR#: [id number] BIRTHDATE: 1955/11/13 , 57  yrs. old GENDER: Female ENDOSCOPIST: Hart Carwin, MD REFERRED BY:  Quentin Mulling, P.A. PROCEDURE DATE:  07/12/2012 PROCEDURE:  EGD w/ biopsy ASA CLASS:     Class I INDICATIONS:  Dyspepsia.  mother has celiac disease, sprue profile negative, hx of H.pylori, treated in the past. MEDICATIONS: MAC sedation, administered by CRNA and propofol (Diprivan) 100mg  IV TOPICAL ANESTHETIC: none  DESCRIPTION OF PROCEDURE: After the risks benefits and alternatives of the procedure were thoroughly explained, informed consent was obtained.  The LB [id number] endoscope was introduced through the mouth and advanced to the second portion of the duodenum. Without limitations.  The instrument was slowly withdrawn as the mucosa was fully examined.      esophagus: Proximal mid and distal esophageal mucosa appeared normal. There was no evidence of esophagitis stricture or hiatal hernia Stomach: Gastric mucosa appeared normal. Rugal folds and gastric antrum were unremarkable biopsies were taken from the gastric antrum to rule out H. pyloric. Retroflexion of the endoscope revealed normal fundus and cardia. Gastric outlet was normal Duodenum: Duodenal bulb and descending duodenum appeared normal. Biopsies were taken from the descending duodenum to rule out villous atrophy[         The scope was then withdrawn from the patient and the procedure completed.  COMPLICATIONS: There were no complications. ENDOSCOPIC IMPRESSION: normal upper endoscopy of esophagus stomach and duodenum. Biopsies from the gastric antrum to rule out H. pylori, biopsy from descending duodenum to rule out sprue RECOMMENDATIONS: 1.  Await pathology results 2.  Continue PPI 3. proceed with colonoscopy  REPEAT EXAM: no  eSigned:  Hart Carwin, MD  07/12/2012 12:38 PM   CC:  PATIENT NAME:  Patricia Ryan, Patricia Ryan MR#: [id number]

## 2012-07-12 NOTE — Progress Notes (Signed)
Called to room to assist during endoscopic procedure.  Patient ID and intended procedure confirmed with present staff. Received instructions for my participation in the procedure from the performing physician.  

## 2012-07-12 NOTE — Progress Notes (Signed)
Stable to RR 

## 2012-07-12 NOTE — Op Note (Signed)
[facility] [address] [place], [address]   COLONOSCOPY PROCEDURE REPORT  PATIENT: Patricia, Ryan  MR#: [id number] BIRTHDATE: [birth year] , 57  yrs. old GENDER: Female ENDOSCOPIST: Hart Carwin, MD REFERRED BY:  Quentin Mulling, P.A. PROCEDURE DATE:  07/12/2012 PROCEDURE:   Colonoscopy with cold biopsy polypectomy ASA CLASS:   Class II INDICATIONS:Average risk patient for colon cancer and last colonoscopy 2004 was normal. MEDICATIONS: MAC sedation, administered by CRNA and propofol (Diprivan) 200mg  IV  DESCRIPTION OF PROCEDURE:   After the risks and benefits and of the procedure were explained, informed consent was obtained.  A digital rectal exam revealed no abnormalities of the rectum.    The LB [id number]  endoscope was introduced through the anus and advanced to the cecum, which was identified by both the appendix and ileocecal valve .  The quality of the prep was good, using MoviPrep .  The instrument was then slowly withdrawn as the colon was fully examined.     COLON FINDINGS: A smooth sessile polyp ranging between 3-28mm in size was found in the rectum.  A polypectomy was performed with cold forceps.  The resection was complete and the polyp tissue was completely retrieved.     Retroflexed views revealed no abnormalities.     The scope was then withdrawn from the patient and the procedure completed.  COMPLICATIONS: There were no complications. ENDOSCOPIC IMPRESSION: Sessile polyp ranging between 3-54mm in size was found in the rectum; polypectomy was performed with cold forceps  RECOMMENDATIONS: 1.  Await pathology results 2.  High fiber diet   REPEAT EXAM: In 10 year(s)  for Colonoscopy.  cc:  _______________________________ eSignedHart Carwin, MD 07/12/2012 12:41 PM     PATIENT NAME:  Patricia, Ryan MR#: [id number]

## 2012-07-12 NOTE — Patient Instructions (Addendum)
Findings:  Normal Endoscopy, one polyp in colon Recommendations:  Wait for results of pathology, continue PPI, High fiber diet   YOU HAD AN ENDOSCOPIC PROCEDURE TODAY AT THE [facility]: Refer to the procedure report that was given to you for any specific questions about what was found during the examination.  If the procedure report does not answer your questions, please call your gastroenterologist to clarify.  If you requested that your care partner not be given the details of your procedure findings, then the procedure report has been included in a sealed envelope for you to review at your convenience later.  YOU SHOULD EXPECT: Some feelings of bloating in the abdomen. Passage of more gas than usual.  Walking can help get rid of the air that was put into your GI tract during the procedure and reduce the bloating. If you had a lower endoscopy (such as a colonoscopy or flexible sigmoidoscopy) you may notice spotting of blood in your stool or on the toilet paper. If you underwent a bowel prep for your procedure, then you may not have a normal bowel movement for a few days.  DIET: Your first meal following the procedure should be a light meal and then it is ok to progress to your normal diet.  A half-sandwich or bowl of soup is an example of a good first meal.  Heavy or fried foods are harder to digest and may make you feel nauseous or bloated.  Likewise meals heavy in dairy and vegetables can cause extra gas to form and this can also increase the bloating.  Drink plenty of fluids but you should avoid alcoholic beverages for 24 hours.  ACTIVITY: Your care partner should take you home directly after the procedure.  You should plan to take it easy, moving slowly for the rest of the day.  You can resume normal activity the day after the procedure however you should NOT DRIVE or use heavy machinery for 24 hours (because of the sedation medicines used during the test).    SYMPTOMS TO REPORT  IMMEDIATELY: A gastroenterologist can be reached at any hour.  During normal business hours, 8:30 AM to 5:00 PM Monday through Friday, call [phone].  After hours and on weekends, please call the GI answering service at [phone] who will take a message and have the physician on call contact you.   Following lower endoscopy (colonoscopy or flexible sigmoidoscopy):  Excessive amounts of blood in the stool  Significant tenderness or worsening of abdominal pains  Swelling of the abdomen that is new, acute  Fever of 100F or higher  Following upper endoscopy (EGD)  Vomiting of blood or coffee ground material  New chest pain or pain under the shoulder blades  Painful or persistently difficult swallowing  New shortness of breath  Fever of 100F or higher  Black, tarry-looking stools  FOLLOW UP: If any biopsies were taken you will be contacted by phone or by letter within the next 1-3 weeks.  Call your gastroenterologist if you have not heard about the biopsies in 3 weeks.  Our staff will call the home number listed on your records the next business day following your procedure to check on you and address any questions or concerns that you may have at that time regarding the information given to you following your procedure. This is a courtesy call and so if there is no answer at the home number and we have not heard from you through the emergency physician  on call, we will assume that you have returned to your regular daily activities without incident.  SIGNATURES/CONFIDENTIALITY: You and/or your care partner have signed paperwork which will be entered into your electronic medical record.  These signatures attest to the fact that that the information above on your After Visit Summary has been reviewed and is understood.  Full responsibility of the confidentiality of this discharge information lies with you and/or your care-partner.  Please follow all discharge instructions given to you  by the recovery room nurse. If you have any questions or problems after discharge please call one of the numbers listed above. You will receive a phone call in the am to see how you are doing and answer any questions you may have. Thank you for choosing [facility] for your health care needs.

## 2012-07-13 ENCOUNTER — Ambulatory Visit: Payer: 59 | Admitting: Obstetrics & Gynecology

## 2012-07-13 ENCOUNTER — Telehealth: Payer: Self-pay

## 2012-07-13 NOTE — Telephone Encounter (Signed)
  Follow up Call-  Call back number 07/12/2012  Post procedure Call Back phone  # [phone]  Permission to leave phone message Yes     Patient questions:  Do you have a fever, pain , or abdominal swelling? no Pain Score  0 *  Have you tolerated food without any problems? yes  Have you been able to return to your normal activities? yes  Do you have any questions about your discharge instructions: Diet   no Medications  no Follow up visit  no  Do you have questions or concerns about your Care? no  Actions: * If pain score is 4 or above: No action needed, pain <4.

## 2012-07-17 ENCOUNTER — Encounter: Payer: Self-pay | Admitting: Internal Medicine

## 2012-07-21 ENCOUNTER — Ambulatory Visit (INDEPENDENT_AMBULATORY_CARE_PROVIDER_SITE_OTHER): Payer: 59 | Admitting: Obstetrics & Gynecology

## 2012-07-21 ENCOUNTER — Encounter: Payer: Self-pay | Admitting: Obstetrics & Gynecology

## 2012-07-21 VITALS — BP 98/58 | HR 60 | Resp 12 | Ht 63.75 in | Wt 130.6 lb

## 2012-07-21 DIAGNOSIS — N3010 Interstitial cystitis (chronic) without hematuria: Secondary | ICD-10-CM

## 2012-07-21 DIAGNOSIS — N950 Postmenopausal bleeding: Secondary | ICD-10-CM

## 2012-07-21 NOTE — Progress Notes (Signed)
Subjective:     Patient ID: Ryan Palermo, female   DOB: [birth year], 57 y.o.   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  She had this for three days.  She is on HRT and she hasn't missed any.  No breast tenderness.  No cramping.  Did undergo a colonoscopy with biopsies/polyp removal with Dr. Juanda Chance.   Retiring today!!!  Review of Systems  All other systems reviewed and are negative.   Dip U/A neg in office   Objective:   Physical Exam  Constitutional: She is oriented to person, place, and time. She appears well-developed and well-nourished.  Genitourinary: Vagina normal and uterus normal.  Neurological: She is alert and oriented to person, place, and time.   Endometrial biopsy recommended.  Discussed with patient.  Verbal and written consent obtained.   Procedure:  Speculum placed.  Cervix visualized and cleansed with betadine prep.  A single toothed tenaculum was applied to the anterior lip of the cervix.  Endometrial pipelle was advanced through the cervix into the endometrial cavity without difficulty.  Pipelle passed to 7.5cm.  Suction applied and pipelle removed with good tissue sample obtained.  Tenculum removed.  No bleeding noted.  Patient tolerated procedure well.     Assessment:     Postmenopausal bleeding     Plan:     Endometrial biopsy pending.

## 2012-07-21 NOTE — Patient Instructions (Signed)

## 2012-07-25 DIAGNOSIS — N3010 Interstitial cystitis (chronic) without hematuria: Secondary | ICD-10-CM | POA: Insufficient documentation

## 2012-07-25 NOTE — Addendum Note (Signed)
Addended by: Jerene Bears on: 07/25/2012 11:12 AM   Modules accepted: Orders

## 2012-08-01 ENCOUNTER — Ambulatory Visit: Payer: 59 | Admitting: Obstetrics & Gynecology

## 2012-08-07 ENCOUNTER — Ambulatory Visit: Payer: 59 | Admitting: Gynecology

## 2012-08-23 ENCOUNTER — Telehealth: Payer: Self-pay | Admitting: Internal Medicine

## 2012-08-23 MED ORDER — SUCRALFATE 1 GM/10ML PO SUSP: ORAL | Status: DC

## 2012-08-23 NOTE — Telephone Encounter (Signed)
Pt aware and script sent to pharmacy. 

## 2012-08-23 NOTE — Telephone Encounter (Signed)
Pt states she had surgery on her foot and she has been taking oxycodone and keflex since then. States that her stomach has been hurting terribly since yesterday. Pt states she is taking the protonix daily but she is still having the pain. Pt wants to know if there is something else she can try for the stomach pain. Please advise.

## 2012-08-23 NOTE — Telephone Encounter (Signed)
Please send Carafate slurry ,12oz, 10cc po qid x 48 hours then bid, 1 refill

## 2012-09-19 ENCOUNTER — Other Ambulatory Visit: Payer: Self-pay | Admitting: Internal Medicine

## 2012-10-20 ENCOUNTER — Encounter: Payer: Self-pay | Admitting: Obstetrics & Gynecology

## 2012-10-25 ENCOUNTER — Encounter: Payer: Self-pay | Admitting: Obstetrics & Gynecology

## 2012-11-08 DIAGNOSIS — N3010 Interstitial cystitis (chronic) without hematuria: Secondary | ICD-10-CM | POA: Insufficient documentation

## 2012-11-28 ENCOUNTER — Ambulatory Visit (INDEPENDENT_AMBULATORY_CARE_PROVIDER_SITE_OTHER): Payer: 59

## 2012-11-28 VITALS — BP 128/60 | HR 54 | Resp 16 | Ht 64.0 in | Wt 128.0 lb

## 2012-11-28 DIAGNOSIS — Z9889 Other specified postprocedural states: Secondary | ICD-10-CM

## 2012-11-28 DIAGNOSIS — M1990 Unspecified osteoarthritis, unspecified site: Secondary | ICD-10-CM

## 2012-11-28 DIAGNOSIS — M2010 Hallux valgus (acquired), unspecified foot: Secondary | ICD-10-CM

## 2012-11-28 NOTE — Patient Instructions (Signed)
Osteoarthritis Osteoarthritis is the most common form of arthritis. It is redness, soreness, and swelling (inflammation) affecting the cartilage. Cartilage acts as a cushion, covering the ends of bones where they meet to form a joint. CAUSES  Over time, the cartilage begins to wear away. This causes bone to rub on bone. This produces pain and stiffness in the affected joints. Factors that contribute to this problem are:  Excessive body weight.  Age.  Overuse of joints. SYMPTOMS   People with osteoarthritis usually experience joint pain, swelling, or stiffness.  Over time, the joint may lose its normal shape.  Small deposits of bone (osteophytes) may grow on the edges of the joint.  Bits of bone or cartilage can break off and float inside the joint space. This may cause more pain and damage.  Osteoarthritis can lead to depression, anxiety, feelings of helplessness, and limitations on daily activities. The most commonly affected joints are in the:  Ends of the fingers.  Thumbs.  Neck.  Lower back.  Knees.  Hips. DIAGNOSIS  Diagnosis is mostly based on your symptoms and exam. Tests may be helpful, including:  X-rays of the affected joint.  A computerized magnetic scan (MRI).  Blood tests to rule out other types of arthritis.  Joint fluid tests. This involves using a needle to draw fluid from the joint and examining the fluid under a microscope. TREATMENT  Goals of treatment are to control pain, improve joint function, maintain a normal body weight, and maintain a healthy lifestyle. Treatment approaches may include:  A prescribed exercise program with rest and joint relief.  Weight control with nutritional education.  Pain relief techniques such as:  Properly applied heat and cold.  Electric pulses delivered to nerve endings under the skin (transcutaneous electrical nerve stimulation, TENS).  Massage.  Certain supplements. Ask your caregiver before using any  supplements, especially in combination with prescribed drugs.  Medicines to control pain, such as:  Acetaminophen.  Nonsteroidal anti-inflammatory drugs (NSAIDs), such as naproxen.  Narcotic or central-acting agents, such as tramadol. This drug carries a risk of addiction and is generally prescribed for short-term use.  Corticosteroids. These can be given orally or as injection. This is a short-term treatment, not recommended for routine use.  Surgery to reposition the bones and relieve pain (osteotomy) or to remove loose pieces of bone and cartilage. Joint replacement may be needed in advanced states of osteoarthritis. HOME CARE INSTRUCTIONS  Your caregiver can recommend specific types of exercise. These may include:  Strengthening exercises. These are done to strengthen the muscles that support joints affected by arthritis. They can be performed with weights or with exercise bands to add resistance.  Aerobic activities. These are exercises, such as brisk walking or low-impact aerobics, that get your heart pumping. They can help keep your lungs and circulatory system in shape.  Range-of-motion activities. These keep your joints limber.  Balance and agility exercises. These help you maintain daily living skills. Learning about your condition and being actively involved in your care will help improve the course of your osteoarthritis. SEEK MEDICAL CARE IF:   You feel hot or your skin turns red.  You develop a rash in addition to your joint pain.  You have an oral temperature above 102 F (38.9 C). FOR MORE INFORMATION  National Institute of Arthritis and Musculoskeletal and Skin Diseases: www.niams.nih.gov National Institute on Aging: www.nia.nih.gov American College of Rheumatology: www.rheumatology.org Document Released: 01/11/2005 Document Revised: 04/05/2011 Document Reviewed: 04/24/2009 [employer] Patient Information 2014 [employer].  

## 2012-11-28 NOTE — Progress Notes (Signed)
  Subjective:    Patient ID: Patricia Ryan, female    DOB: 11-May-1955, 57 y.o.   MRN: [id number]  HPI patient presents at this time 3 months status post [facility] bunionectomy left foot. Still having some complaints of some numbness in the incision area and the distal hallux also some stiffness in the great toe joint.    Review of Systems deferred at this visit    Objective:   Physical Exam Neurovascular status is intact. Pedal pulses palpable epicritic and proprioceptive sensations intact. There is decreased sensation over the hallux near the incision area patient describes and slight paresthesia consistent with postop course. This is improved slightly since initial presentations. X-rays reveal good position the osteotomy and consolidation of the osteotomies with intact pin fixations. There is excellent range of dorsiflexion approximately 75-80 at the great toe joint however there is some stiffness on plantar flexion.      Assessment & Plan:  Assessment good postop progress following [place] bunionectomy left foot however stressed the need for additional range of motion exercises in particular plantar flexion. Will continue with range of motion exercises suggest a 3 a six-month long-term postop followup evaluation. We'll continue to exercise the toe daily as instructed maintain good shoe wear currently wearing a Brooks or new balance running shoe. Next  Alvan Dame DPM

## 2012-12-05 ENCOUNTER — Ambulatory Visit (INDEPENDENT_AMBULATORY_CARE_PROVIDER_SITE_OTHER): Payer: 59 | Admitting: Physician Assistant

## 2012-12-05 VITALS — BP 100/60 | HR 75 | Temp 98.6°F | Resp 16 | Ht 63.25 in | Wt 130.4 lb

## 2012-12-05 DIAGNOSIS — J069 Acute upper respiratory infection, unspecified: Secondary | ICD-10-CM

## 2012-12-05 MED ORDER — GUAIFENESIN ER 1200 MG PO TB12: 1.0000 | ORAL_TABLET | Freq: Two times a day (BID) | ORAL | Status: AC

## 2012-12-05 MED ORDER — PROMETHAZINE-DM 6.25-15 MG/5ML PO SYRP: 5.0000 mL | ORAL_SOLUTION | Freq: Four times a day (QID) | ORAL | Status: DC | PRN

## 2012-12-05 NOTE — Patient Instructions (Signed)
Push fluids - use Tylenol/motrin for body aches and feeling bad.  If the cough syrup does not help with the cough at night let me know and I will send something stronger to the pharmacy.

## 2012-12-05 NOTE — Progress Notes (Signed)
    Subjective:    Patient ID: Patricia Ryan, female    DOB: [birth year], 57 y.o.   MRN: [id number]  HPI Pt presents to clinic with 3 day h/o cold symptoms.  She is up all night coughing.  She has rhinorrhea with her nasal congestion that is thick in the am with slight amount of blood but then throughout the day it is clear.  She has seasonal allergies but this is different and her husband has been having similar symptoms the last week.    Sick contacts at home. OTC - dayquil and nyquil. Review of Systems  Constitutional: Negative for fever and chills.  HENT: Positive for congestion, postnasal drip and rhinorrhea (thicker in the am but clear throughout the day). Negative for sore throat.   Respiratory: Positive for cough (dry cough that is worse at night).   Gastrointestinal: Negative for vomiting and diarrhea.  Musculoskeletal: Negative for myalgias.  Allergic/Immunologic: Positive for environmental allergies (this feels different).       Objective:   Physical Exam  Vitals reviewed. Constitutional: She is oriented to person, place, and time. She appears well-developed and well-nourished.  HENT:  Head: Normocephalic and atraumatic.  Right Ear: Hearing, tympanic membrane, external ear and ear canal normal.  Left Ear: Hearing, tympanic membrane, external ear and ear canal normal.  Nose: Mucosal edema (red) present.  Mouth/Throat: Uvula is midline, oropharynx is clear and moist and mucous membranes are normal.  Eyes: Conjunctivae are normal.  Neck: Normal range of motion.  Cardiovascular: Normal rate, regular rhythm and normal heart sounds.   No murmur heard. Pulmonary/Chest: Effort normal and breath sounds normal. She has no wheezes.  Lymphadenopathy:    She has no cervical adenopathy.  Neurological: She is alert and oriented to person, place, and time.  Skin: Skin is warm and dry.  Psychiatric: She has a normal mood and affect. Her behavior is normal. Judgment and thought  content normal.      Assessment & Plan:  Viral URI with cough - Plan: Guaifenesin (MUCINEX MAXIMUM STRENGTH) 1200 MG TB12, promethazine-dextromethorphan (PROMETHAZINE-DM) 6.25-15 MG/5ML syrup  Push fluids - pt states that CDN products make her nauseated so we will try a phenergan based cough medication but if it does not help she may call for a different product.  Benny Lennert PA-C 12/05/2012 11:39 AM

## 2012-12-07 ENCOUNTER — Telehealth: Payer: Self-pay | Admitting: Obstetrics & Gynecology

## 2012-12-07 NOTE — Telephone Encounter (Signed)
Spoke with patient. She has been with current partner for 1.5 years and approximately 6 months ago, noted skin tags to partner penis. He was recently seen by dermatology and dx with genital warts. Patient denies symptoms at this time. States that she has not had any warts that she has noted.  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 Last AEX with Dr. Farrel Gobble was 01/31/12-no pap. Last Pap was 01/25/11 with Dr. Hyacinth Meeker. Suggested OV with Dr. Hyacinth Meeker to discuss, may need new pap. Patient declined earlier office visit for 11/14, she requested 11/18 and appointment scheduled.    Routing to provider for final review. Patient agreeable to disposition.

## 2012-12-07 NOTE — Telephone Encounter (Signed)
Patient is calling to ask some questions about what she needs to do. Her boyfriend was diagnosed with genital warts. And the dermatologist told him that she would need a pap to test but she isnt due until jan. She doesn't know what she needs to do.

## 2012-12-12 ENCOUNTER — Ambulatory Visit (INDEPENDENT_AMBULATORY_CARE_PROVIDER_SITE_OTHER): Payer: 59 | Admitting: Obstetrics & Gynecology

## 2012-12-12 VITALS — BP 122/60 | HR 68 | Resp 16 | Ht 63.5 in | Wt 130.0 lb

## 2012-12-12 DIAGNOSIS — N9089 Other specified noninflammatory disorders of vulva and perineum: Secondary | ICD-10-CM

## 2012-12-12 DIAGNOSIS — Z202 Contact with and (suspected) exposure to infections with a predominantly sexual mode of transmission: Secondary | ICD-10-CM

## 2012-12-12 DIAGNOSIS — J069 Acute upper respiratory infection, unspecified: Secondary | ICD-10-CM

## 2012-12-12 MED ORDER — AZITHROMYCIN 250 MG PO TABS: ORAL_TABLET | ORAL | Status: DC

## 2012-12-12 NOTE — Patient Instructions (Signed)

## 2012-12-12 NOTE — Progress Notes (Signed)
Subjective:     Patient ID: Patricia Ryan, female   DOB: [birth year], 57 y.o.   MRN: [id number]  HPI 57 yo DWF here for discussion of new diagnosis with of genital warts in boyfriend.  He noticed the first area about six months ago.  Thought it was a skin tag.  The last month, areas have progressed.  He saw PCP who diagnosed genital warts and referred him to Dr. Terri Piedra for cryo/removal.  He has been treated and has follow-up in about a month.  Also, has cough, sputum production, stuffiness that she has had for weeks.  Saw PCP's office.  OTC products recommended.  Feels like needs something else.  No fever.  Tired.  Review of Systems  All other systems reviewed and are negative.       Objective:   Physical Exam  Constitutional: She is oriented to person, place, and time. She appears well-developed and well-nourished.  Genitourinary: Vagina normal and uterus normal.    There is no rash, tenderness or lesion on the right labia. There is no rash, tenderness or lesion on the left labia. Cervix exhibits no motion tenderness, no discharge and no friability. Right adnexum displays no tenderness and no fullness. Left adnexum displays no tenderness and no fullness.  Musculoskeletal: Normal range of motion.  Lymphadenopathy:       Right: No inguinal adenopathy present.       Left: Inguinal adenopathy present.  Neurological: She is alert and oriented to person, place, and time.  Skin: Skin is warm and dry.  Psychiatric: She has a normal mood and affect.   Biopsy of lesion discussed.  Pt agrees.  Area cleansed with Hibiclens x 3.  0.5cc 1% Lidocaine instilled.  Lesion biopsied/fully excised at same time with sterile pick-ups and #11 blade.  Silver nitrate used for hemostasis.  Dressing applied.  Pt tolerated procedure well.    Assessment:     Significant other with recent genital wart diagnosis.  (Pt has neg STD testing 12/12 after end of marriage.  Declines repeat testing today as does not feel  needed.) Raised/pigmented right inner thigh lesion URI    Plan:     Lesion to pathology Pt reassured.  Will repeat HR HPV testing with Pap 1/15 at [facility] pak to pharmacy

## 2012-12-13 ENCOUNTER — Encounter: Payer: Self-pay | Admitting: Obstetrics & Gynecology

## 2012-12-14 ENCOUNTER — Telehealth: Payer: Self-pay | Admitting: Obstetrics & Gynecology

## 2012-12-14 DIAGNOSIS — N9089 Other specified noninflammatory disorders of vulva and perineum: Secondary | ICD-10-CM

## 2012-12-14 NOTE — Telephone Encounter (Signed)
Spoke with Missy at [facility] to report pt's biopsy site was right inner thigh.

## 2012-12-14 NOTE — Telephone Encounter (Signed)
[facility] is calling for a biopsy site from the patient's last visit please?

## 2012-12-20 NOTE — Telephone Encounter (Signed)
Routing to Dr. Miller for results review.    

## 2012-12-20 NOTE — Telephone Encounter (Signed)
Patient calling to check on status of referral to a dermatologist for follow up from recently pathology from a mole removal.

## 2012-12-20 NOTE — Telephone Encounter (Signed)
Does need a derm referral to Dr. Nino Parsley office.  Once area is healed, needs to return to me for re-excision to make sure it is all gone.  Doesn't need to wait for dermatology appt to do that.  Appt with me about 2 weeks.

## 2012-12-26 NOTE — Telephone Encounter (Signed)
Spoke with pt about 2 week follow up appt with SM to check area. Scheduled appt 01-04-13 at 10:15.

## 2012-12-29 ENCOUNTER — Telehealth: Payer: Self-pay | Admitting: Obstetrics & Gynecology

## 2012-12-29 NOTE — Telephone Encounter (Signed)
Patient called and cancelled her appointment for recheck with Dr. Hyacinth Meeker 01/04/13. She has been to see Dr. Emily Filbert at [facility] who checked the area and will see the patient back in three months to recheck it.

## 2012-12-29 NOTE — Telephone Encounter (Signed)
Patricia Ryan, Can you call pt.  Did she take pathology report with her?  Does she want [place] to send it to Dr. Emily Filbert.  I am fine with Dr. Emily Filbert following it but I want her to have all the information to make the correct decision about management.  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  I wanted her to return for re-excision but pt went to dermatologist--which is fine.  I am happy for Dr. Emily Filbert to manage but, again, just want her to have all the information.  Thanks.

## 2013-01-04 ENCOUNTER — Ambulatory Visit: Payer: 59 | Admitting: Obstetrics & Gynecology

## 2013-01-09 NOTE — Telephone Encounter (Signed)
Routed to Sally 

## 2013-01-09 NOTE — Telephone Encounter (Signed)
Follow up call to patient, LMTCB. 

## 2013-01-10 NOTE — Telephone Encounter (Signed)
Returning a call to Patricia Ryan. °

## 2013-01-11 NOTE — Telephone Encounter (Signed)
LMTCB

## 2013-01-11 NOTE — Telephone Encounter (Signed)
Patient returning call.  Advised just following up to be sure she had path report when she had derm appt with Dr Emily Filbert.  She states she thinks Dr Emily Filbert was aware because they discussed that it was a "very early skin cancer in the top layer of skin and that it looked like Dr Hyacinth Meeker got it all".  Advised that Dr Hyacinth Meeker is fine with Dr Emily Filbert managing this; just making sure they had all the information needed.  Patient states dr Emily Filbert did a full body exam as well and has her coming in March and again in one year.  Offered to send copy of path report for their records and patient would like this.  Asked her to have Dr Emily Filbert send progress note after March 2015 visit.  Results faxed to Dr Emily Filbert office.  Routing to provider for final review. Patient agreeable to disposition. Will close encounter

## 2013-01-14 ENCOUNTER — Other Ambulatory Visit: Payer: Self-pay | Admitting: Internal Medicine

## 2013-01-26 ENCOUNTER — Ambulatory Visit (INDEPENDENT_AMBULATORY_CARE_PROVIDER_SITE_OTHER): Payer: 59 | Admitting: Physician Assistant

## 2013-01-26 ENCOUNTER — Encounter: Payer: Self-pay | Admitting: Physician Assistant

## 2013-01-26 VITALS — BP 100/60 | HR 76 | Temp 97.9°F | Resp 16 | Ht 63.5 in | Wt 130.0 lb

## 2013-01-26 DIAGNOSIS — I10 Essential (primary) hypertension: Secondary | ICD-10-CM

## 2013-01-26 DIAGNOSIS — Z0000 Encounter for general adult medical examination without abnormal findings: Secondary | ICD-10-CM

## 2013-01-26 LAB — FERRITIN: Ferritin: 60 ng/mL (ref 10–291)

## 2013-01-26 LAB — BASIC METABOLIC PANEL WITH GFR
BUN: 16 mg/dL (ref 6–23)
CO2: 20 mEq/L (ref 19–32)
Calcium: 9.4 mg/dL (ref 8.4–10.5)
Chloride: 108 mEq/L (ref 96–112)
Creat: 0.73 mg/dL (ref 0.50–1.10)
GFR, Est African American: >89 mL/min
Glucose, Bld: 105 mg/dL — ABNORMAL HIGH (ref 70–99)
POTASSIUM: 4 meq/L (ref 3.5–5.3)
Sodium: 140 mEq/L (ref 135–145)

## 2013-01-26 LAB — HEPATIC FUNCTION PANEL
ALT: 14 U/L (ref 0–35)
AST: 16 U/L (ref 0–37)
Albumin: 4.6 g/dL (ref 3.5–5.2)
Alkaline Phosphatase: 56 U/L (ref 39–117)
BILIRUBIN INDIRECT: 0.5 mg/dL (ref 0.0–0.9)
Bilirubin, Direct: 0.1 mg/dL (ref 0.0–0.3)
Total Bilirubin: 0.6 mg/dL (ref 0.3–1.2)
Total Protein: 7 g/dL (ref 6.0–8.3)

## 2013-01-26 LAB — MAGNESIUM: Magnesium: 1.9 mg/dL (ref 1.5–2.5)

## 2013-01-26 LAB — TSH: TSH: 0.799 u[IU]/mL (ref 0.350–4.500)

## 2013-01-26 LAB — IRON AND TIBC
%SAT: 55 % (ref 20–55)
IRON: 187 ug/dL — AB (ref 42–145)
TIBC: 340 ug/dL (ref 250–470)
UIBC: 153 ug/dL (ref 125–400)

## 2013-01-26 LAB — CBC WITH DIFFERENTIAL/PLATELET
BASOS PCT: 1 % (ref 0–1)
Basophils Absolute: 0 10*3/uL (ref 0.0–0.1)
Eosinophils Absolute: 0.1 10*3/uL (ref 0.0–0.7)
Eosinophils Relative: 2 % (ref 0–5)
HCT: 42 % (ref 36.0–46.0)
Hemoglobin: 14.2 g/dL (ref 12.0–15.0)
Lymphocytes Relative: 33 % (ref 12–46)
Lymphs Abs: 1.7 10*3/uL (ref 0.7–4.0)
MCH: 30.5 pg (ref 26.0–34.0)
MCHC: 33.8 g/dL (ref 30.0–36.0)
MCV: 90.1 fL (ref 78.0–100.0)
Monocytes Absolute: 0.4 10*3/uL (ref 0.1–1.0)
Monocytes Relative: 9 % (ref 3–12)
Neutro Abs: 2.8 10*3/uL (ref 1.7–7.7)
Neutrophils Relative %: 55 % (ref 43–77)
Platelets: 224 10*3/uL (ref 150–400)
RBC: 4.66 MIL/uL (ref 3.87–5.11)
RDW: 13.5 % (ref 11.5–15.5)
WBC: 5.1 10*3/uL (ref 4.0–10.5)

## 2013-01-26 LAB — VITAMIN B12: Vitamin B-12: 393 pg/mL (ref 211–911)

## 2013-01-26 LAB — LIPID PANEL
CHOL/HDL RATIO: 3.3 ratio
CHOLESTEROL: 169 mg/dL (ref 0–200)
HDL: 52 mg/dL (ref >39)
LDL Cholesterol: 99 mg/dL (ref 0–99)
Triglycerides: 88 mg/dL (ref <150)
VLDL: 18 mg/dL (ref 0–40)

## 2013-01-26 LAB — HEMOGLOBIN A1C
Hgb A1c MFr Bld: 5.8 % — ABNORMAL HIGH (ref <5.7)
Mean Plasma Glucose: 120 mg/dL — ABNORMAL HIGH (ref <117)

## 2013-01-26 MED ORDER — ROSUVASTATIN CALCIUM 20 MG PO TABS: 20.0000 mg | ORAL_TABLET | Freq: Every day | ORAL | Status: DC

## 2013-01-26 MED ORDER — ALPRAZOLAM 0.25 MG PO TABS: 0.2500 mg | ORAL_TABLET | Freq: Three times a day (TID) | ORAL | Status: DC | PRN

## 2013-01-26 MED ORDER — OSELTAMIVIR PHOSPHATE 75 MG PO CAPS: 75.0000 mg | ORAL_CAPSULE | Freq: Every day | ORAL | Status: AC

## 2013-01-26 NOTE — Patient Instructions (Signed)
Add Benefiber- 1-2 TBSP in the morning for weight loss and constipation. Increase water  What is the TMJ? The temporomandibular (tem-PUH-ro-man-DIB-yoo-ler) joint, or the TMJ, connects the upper and lower jawbones. This joint allows the jaw to open wide and move back and forth when you chew, talk, or yawn.There are also several muscles that help this joint move. There can be muscle tightness and pain in the muscle that can cause several symptoms.  What causes TMJ pain? There are many causes of TMJ pain. Repeated chewing (for example, chewing gum) and clenching your teeth can cause pain in the joint. Some TMJ pain has no obvious cause. What can I do to ease the pain? There are many things you can do to help your pain get better. When you have pain:  Eat soft foods and stay away from chewy foods (for example, taffy) Try to use both sides of your mouth to chew Don't chew gum Don't open your mouth wide (for example, during yawning or singing) Don't bite your cheeks or fingernails Lower your amount of stress and worry Applying a warm, damp washcloth to the joint may help. Over-the-counter pain medicines such as ibuprofen (one brand: Advil) or acetaminophen (one brand: Tylenol) might also help. Do not use these medicines if you are allergic to them or if your doctor told you not to use them. How can I stop the pain from coming back? When your pain is better, you can do these exercises to make your muscles stronger and to keep the pain from coming back:  Resisted mouth opening: Place your thumb or two fingers under your chin and open your mouth slowly, pushing up lightly on your chin with your thumb. Hold for three to six seconds. Close your mouth slowly. Resisted mouth closing: Place your thumbs under your chin and your two index fingers on the ridge between your mouth and the bottom of your chin. Push down lightly on your chin as you close your mouth. Tongue up: Slowly open and close your mouth  while keeping the tongue touching the roof of the mouth. Side-to-side jaw movement: Place an object about one fourth of an inch thick (for example, two tongue depressors) between your front teeth. Slowly move your jaw from side to side. Increase the thickness of the object as the exercise becomes easier Forward jaw movement: Place an object about one fourth of an inch thick between your front teeth and move the bottom jaw forward so that the bottom teeth are in front of the top teeth. Increase the thickness of the object as the exercise becomes easier. These exercises should not be painful. If it hurts to do these exercises, stop doing them and talk to your family doctor.   Bad carbs also include fruit juice, alcohol, and sweet tea. These are empty calories that do not signal to your brain that you are full.   Please remember the good carbs are still carbs which convert into sugar. So please measure them out no more than 1/2-1 cup of rice, oatmeal, pasta, and beans.  Veggies are however free foods! Pile them on.   I like lean protein at every meal such as chicken, [place], pork chops, cottage cheese, etc. Just do not fry these meats and please center your meal around vegetable, the meats should be a side dish.   No all fruit is created equal. Please see the list below, the fruit at the bottom is higher in sugars than the fruit at the top

## 2013-01-26 NOTE — Progress Notes (Signed)
Complete Physical HPI Patient presents for complete physical.   Patient's blood pressure has been controlled at home. Patient works out 3-5 days a week and denies chest pain, shortness of breath, dizziness. BP: 100/60 mmHg  Patient's cholesterol is diet controlled. They are on crestor QOD and denies myalgias. The patient's cholesterol last visit was LDL 113, trig 117, HDL 72. The patient has been working on diet and exercise for prediabetes, denies changes in vision, polys, and paresthesias. Last A1C in office was 5.9. Vitamin D was 45 last visit and patient was instructed to get on 4000 total but she is on 1600mg  Vitamin D daily.  Patient had someone staying with her over the holidays and on Tuesday that person was diagnosed with the flu. The patient is not having symptoms at this time.  Anxiety- she takes the xanax rarely but does need a refill. She lost her daughter approx 1.5 years ago and still takes occasionally- last filled March 2013.   Current Medications:  Current Outpatient Prescriptions on File Prior to Visit  Medication Sig Dispense Refill  . Celecoxib (CELEBREX PO) Take by mouth daily.      Marland Kitchen estradiol (ESTRACE) 0.5 MG tablet Take 0.5 mg by mouth daily.      . medroxyPROGESTERone (PROVERA) 5 MG tablet Take 5 mg by mouth daily.      . pantoprazole (PROTONIX) 40 MG tablet TAKE 1 TABLET (40 MG TOTAL) BY MOUTH DAILY.  30 tablet  2  . rosuvastatin (CRESTOR) 10 MG tablet Take 10 mg by mouth 3 (three) times a week.      . tolterodine (DETROL LA) 4 MG 24 hr capsule 4 mg daily.      . valACYclovir (VALTREX) 500 MG tablet Take 500 mg by mouth 1 day or 1 dose.      . vitamin E 400 UNIT capsule Take 200 Units by mouth daily.        No current facility-administered medications on file prior to visit.   Health Maintenance:  Tetanus: 2006 Pneumovax: N/A Flu vaccine: Zostavax: N/A Pap: 2013 negative MGM: 10/05/12 negative DEXA: N/A Colonoscopy: 07/12/2012 Dr. [name] EGD: 07/12/2012  Gastritis, neg Hpylori Allergies:  Allergies  Allergen Reactions  . Sulfa Antibiotics Hives, Shortness Of Breath and Rash  . Macrobid [Nitrofurantoin Macrocrystal] Nausea Only   Medical History:  Past Medical History  Diagnosis Date  . Hyperlipidemia   . GERD (gastroesophageal reflux disease)   . IBS (irritable bowel syndrome)   . Migraines   . Diabetes   . HSV-2 (herpes simplex virus 2) infection   . Anal fissure   . Anxiety   . Interstitial cystitis   . Nevus     vulva nevus   Surgical History:  Past Surgical History  Procedure Laterality Date  . Cholecystectomy    . Tubal reconstruction    . Bladder repair    . Tonsillectomy    . Vaginal delivery      x2  . 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 Tubalplasty  1985/ 1990    Family History:  Family History  Problem Relation Age of Onset  . Celiac disease Mother   . Heart disease Father   . Kidney disease      tumor  . Irritable bowel syndrome Sister   . Thyroid disease Sister   . Pancreatic cancer Maternal Grandmother   . Lung cancer Maternal Grandmother   . Diabetes Paternal Grandmother   . Breast cancer Paternal Grandmother   .  Colon cancer Neg Hx    Social History:  History   Social History  . Marital Status: Married    Spouse Name: N/A    Number of Children: N/A  . Years of Education: N/A   Occupational History  . Not on file.   Social History Main Topics  . Smoking status: Former Smoker    Types: Cigarettes    Quit date: 12/26/2011  . Smokeless tobacco: Never Used  . Alcohol Use: Yes     Comment: social    . Drug Use: No  . Sexual Activity: Yes   Other Topics Concern  . Not on file   Social History Narrative  . No narrative on file   ROS Constitutional: Denies weight loss/gain, headaches, insomnia, fatigue, night sweats, and change in appetite. Eyes: Denies redness, blurred vision, diplopia, discharge, itchy, watery eyes.  ENT: + TMJ Denies discharge, congestion, post  nasal drip, sore throat, earache, hearing loss, dental pain, Tinnitus, Vertigo, Sinus pain, snoring.  Cardio: Denies chest pain, palpitations, irregular heartbeat, dyspnea, diaphoresis, orthopnea, PND, claudication, edema Respiratory: denies cough, dyspnea, pleurisy, hoarseness, wheezing.  Gastrointestinal: + GERD occ, + constipaiton Denies dysphagia,  pain, cramps, nausea, vomiting, bloating, diarrhea, hematemesis, melena, hematochezia, hemorrhoids Genitourinary: Denies dysuria, frequency, urgency, nocturia, hesitancy, discharge, hematuria, flank pain Breast: Denies Breast lumps, nipple discharge, bleeding.  Musculoskeletal: Denies arthralgia, myalgia, stiffness, Jt. Swelling, pain, Skin: Denies pruritis, rash, hives,  acne, eczema, changing in skin lesion Neuro: Denies Weakness, tremor, incoordination, spasms, paresthesia, pain Psychiatric: Denies confusion, memory loss, sensory loss Endocrine: Denies change in weight, skin, hair change, nocturia, and paresthesia, Diabetic Denies Polys, visual blurring, hyper /hypo glycemic episodes.  Heme/Lymph: Denies Excessive bleeding, bruising, enlarged lymph nodes  Physical Exam: Estimated body mass index is 22.66 kg/(m^2) as calculated from the following:   Height as of this encounter: 5' 3.5" (1.613 m).   Weight as of this encounter: 130 lb (58.968 kg). Filed Vitals:   01/26/13 0944  BP: 100/60  Pulse: 76  Temp: 97.9 F (36.6 C)  Resp: 16   General Appearance: Well nourished, in no apparent distress. Eyes: PERRLA, EOMs, conjunctiva no swelling or erythema, normal fundi and vessels. Sinuses: No Frontal/maxillary tenderness ENT/Mouth: Ext aud canals clear, normal light reflex with TMs without erythema, bulging.  Good dentition. No erythema, swelling, or exudate on post pharynx. Tonsils not swollen or erythematous. Hearing normal.  Neck: Supple, thyroid normal. No bruits Respiratory: Respiratory effort normal, BS equal bilaterally without rales,  rhonchi, wheezing or stridor. Cardio: RRR without murmurs, rubs or gallops. Brisk peripheral pulses without edema.  Chest: symmetric, with normal excursions and percussion. Breasts:defer Abdomen: Soft, +BS. Non tender, no guarding, rebound, hernias, masses, or organomegaly. .  Lymphatics: Non tender without lymphadenopathy.  Genitourinary: defer Musculoskeletal: Full ROM all peripheral extremities,5/5 strength, and normal gait. Skin: Warm, dry without rashes, lesions, ecchymosis.  Neuro: Cranial nerves intact, reflexes equal bilaterally. Normal muscle tone, no cerebellar symptoms. Sensation intact.  Psych: Awake and oriented X 3, normal affect, Insight and Judgment appropriate.   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 Screening labs and  tests as requested with regular follow-up as recommended.   Vicie Mutters 9:51 AM

## 2013-01-27 LAB — INSULIN, FASTING: INSULIN FASTING, SERUM: 11 u[IU]/mL (ref 3–28)

## 2013-01-27 LAB — URINALYSIS, ROUTINE W REFLEX MICROSCOPIC
Glucose, UA: NEGATIVE mg/dL
HGB URINE DIPSTICK: NEGATIVE
Ketones, ur: NEGATIVE mg/dL
NITRITE: NEGATIVE
Protein, ur: NEGATIVE mg/dL
SPECIFIC GRAVITY, URINE: 1.027 (ref 1.005–1.030)
UROBILINOGEN UA: 0.2 mg/dL (ref 0.0–1.0)
pH: 5 (ref 5.0–8.0)

## 2013-01-27 LAB — URINALYSIS, MICROSCOPIC ONLY
Bacteria, UA: NONE SEEN
Casts: NONE SEEN

## 2013-01-27 LAB — MICROALBUMIN / CREATININE URINE RATIO
Creatinine, Urine: 221 mg/dL
Microalb Creat Ratio: 3.9 mg/g (ref 0.0–30.0)
Microalb, Ur: 0.87 mg/dL (ref 0.00–1.89)

## 2013-01-27 LAB — VITAMIN D 25 HYDROXY (VIT D DEFICIENCY, FRACTURES): VIT D 25 HYDROXY: 51 ng/mL (ref 30–89)

## 2013-01-29 ENCOUNTER — Telehealth: Payer: Self-pay

## 2013-01-29 NOTE — Telephone Encounter (Signed)
Patient called stated she had surgery back in July.  Since then her toenail has been bothering her feels like it's ingrown.  She wanted to know if we treated that.  I informed her yes.  She asked if we numb it up first.  I informed her yes.  She requested to schedule an appointment.  I transferred her call to a scheduler.

## 2013-01-30 ENCOUNTER — Telehealth: Payer: Self-pay | Admitting: *Deleted

## 2013-01-30 ENCOUNTER — Ambulatory Visit (INDEPENDENT_AMBULATORY_CARE_PROVIDER_SITE_OTHER): Payer: 59

## 2013-01-30 VITALS — BP 118/69 | HR 78 | Resp 16 | Ht 63.0 in | Wt 130.0 lb

## 2013-01-30 DIAGNOSIS — L03039 Cellulitis of unspecified toe: Secondary | ICD-10-CM

## 2013-01-30 DIAGNOSIS — L600 Ingrowing nail: Secondary | ICD-10-CM

## 2013-01-30 MED ORDER — CEPHALEXIN 500 MG PO CAPS: 500.0000 mg | ORAL_CAPSULE | Freq: Three times a day (TID) | ORAL | Status: DC

## 2013-01-30 NOTE — Patient Instructions (Signed)

## 2013-01-30 NOTE — Progress Notes (Signed)
   Subjective:    Patient ID: Patricia Ryan, female    DOB: [birth year], 58 y.o.   MRN: [id number]  "I don't know if the toenail is growing into the skin.  HPI Patient has several week history of ingrowing nail medial lateral borders the contents of an ingrown left hallux nail. It is red painful tender shoes try to do the pedicurist them trimmed up and ambulatory about it.   Review of Systems  All other systems reviewed and are negative.   no new changes or findings at this time     Objective:   Physical Exam Neurovascular status is intact pedal pulses palpable DP and PT +2/4 bilateral for Refill timed 3-4 seconds all digits. Neurologically epicritic and proprioceptive sensations intact and symmetric bilateral. Patient has a healed incision scar from bunionectomy done earlier this past year left great toe joint the wound is well coapted. There is no apparent discharge drainage erythema and erythema lateral nail fold of the left hallux tenderness in the medial nail fold as well and palpation. Nail somewhat criptotic and incurvated. Orthopedic biomechanical exam unremarkable noncontributory except for previous bunion correction hallux appears to be rectus. No history of injury trauma noted.       Assessment & Plan:  Assessment ingrowing nail medial lateral borders left hallux with keratosis of nail and paronychia. Plan at this time patient is given option for permanent nail excision and phenol matricectomy which is recommended this time local anesthetic block is administered total of 3 cc 50-50 mixture of 2% Xylocaine plain and 0.5% Marcaine plain in digital block fashion. Betadine prep performed the medial lateral border is excised nail meniscectomy followed by alcohol wash Betadine ointment and dry sterile dressing being applied. Patient procedure was given instructions for daily soaks and Betadine projection for cephalexin 500 mg 3 times a day x10 days. Recommended Tylenol as needed for  pain recheck in 2 weeks for followup for nail check.  Harriet Masson DPM

## 2013-01-30 NOTE — Telephone Encounter (Signed)
Pt states Cephalexin was sent to a mail order pharmacy, she would not receive the medication for over a week.  Pt states she would like the rx ordered through [facility].  I will change the pharmacy to [facility].

## 2013-02-05 ENCOUNTER — Ambulatory Visit (INDEPENDENT_AMBULATORY_CARE_PROVIDER_SITE_OTHER): Payer: 59 | Admitting: Obstetrics & Gynecology

## 2013-02-05 ENCOUNTER — Encounter: Payer: Self-pay | Admitting: Obstetrics & Gynecology

## 2013-02-05 VITALS — BP 98/58 | HR 64 | Resp 16 | Ht 63.5 in | Wt 129.8 lb

## 2013-02-05 DIAGNOSIS — Z01419 Encounter for gynecological examination (general) (routine) without abnormal findings: Secondary | ICD-10-CM

## 2013-02-05 DIAGNOSIS — Z124 Encounter for screening for malignant neoplasm of cervix: Secondary | ICD-10-CM

## 2013-02-05 MED ORDER — ESTRADIOL 0.5 MG PO TABS: 0.5000 mg | ORAL_TABLET | Freq: Every day | ORAL | Status: DC

## 2013-02-05 MED ORDER — MEDROXYPROGESTERONE ACETATE 5 MG PO TABS: 5.0000 mg | ORAL_TABLET | Freq: Every day | ORAL | Status: DC

## 2013-02-05 NOTE — Progress Notes (Signed)
58 y.o. G2P2 MarriedCaucasianF here for annual exam.  Did see Dr. Delman Cheadle after having lesion on thigh excised.  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  Pt has follow-up scheduled 3/15 and then 11/15.  Had ingrown toe nail removed.  Pt has follow up end of the month.    Seeing Dr. Amalia Hailey tomorrow for discussion of urodynamics and cystoscopy.  No plans for treatment yet.    Labs with Dr. Idell Pickles office.  Glucose was mildly elevated.  Her HbA1C was 5.8.  Reviewed with pt as she has not gotten a call about these.    Patient's last menstrual period was 01/20/2011.          Sexually active: yes  The current method of family planning is none.    Exercising: yes  gym Smoker:  no  Health Maintenance: Pap:  01/25/11 WNL/negative HR HPV History of abnormal Pap:  no MMG:  10/05/12 normal Colonoscopy:  2014 repeat in 10 years, one hyperplastic polyp, Dr.Brodie BMD:   none TDaP:  2006  Screening Labs: Dr. Idell Pickles office, Hb today: PCP, Urine today: PCP   reports that she quit smoking about 13 months ago. Her smoking use included Cigarettes. She smoked 0.00 packs per day. She has never used smokeless tobacco. She reports that she drinks about 1.0 ounces of alcohol per week. She reports that she does not use illicit drugs.  Past Medical History  Diagnosis Date  . Hyperlipidemia   . GERD (gastroesophageal reflux disease)   . IBS (irritable bowel syndrome)   . Migraines   . Diabetes   . HSV-2 (herpes simplex virus 2) infection   . Anal fissure   . Anxiety   . Interstitial cystitis   . Nevus     vulva nevus    Past Surgical History  Procedure Laterality Date  . Cholecystectomy    . Tubal reconstruction    . Bladder repair    . Tonsillectomy    . Vaginal delivery      x2  . 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 Tubalplasty  1985/ 1990   . Bunionectomy      Current Outpatient Prescriptions  Medication Sig Dispense Refill  . ALPRAZolam (XANAX) 0.25 MG tablet Take 1 tablet (0.25 mg  total) by mouth 3 (three) times daily as needed for anxiety.  90 tablet  0  . cephALEXin (KEFLEX) 500 MG capsule Take 1 capsule (500 mg total) by mouth 3 (three) times daily.  30 capsule  0  . estradiol (ESTRACE) 0.5 MG tablet Take 0.5 mg by mouth daily.      . medroxyPROGESTERone (PROVERA) 5 MG tablet Take 5 mg by mouth daily.      . pantoprazole (PROTONIX) 40 MG tablet TAKE 1 TABLET (40 MG TOTAL) BY MOUTH DAILY.  30 tablet  2  . rosuvastatin (CRESTOR) 10 MG tablet Take 10 mg by mouth every Monday, Wednesday, and Friday.      . rosuvastatin (CRESTOR) 20 MG tablet Take 1 tablet (20 mg total) by mouth daily.  90 tablet  0  . tolterodine (DETROL LA) 4 MG 24 hr capsule Take 4 mg by mouth daily.      . valACYclovir (VALTREX) 500 MG tablet Take 500 mg by mouth 1 day or 1 dose.      . vitamin E 400 UNIT capsule Take 200 Units by mouth daily.       . Celecoxib (CELEBREX PO) Take by mouth daily.  No current facility-administered medications for this visit.    Family History  Problem Relation Age of Onset  . Celiac disease Mother   . Heart disease Father   . Kidney disease      tumor  . Irritable bowel syndrome Sister   . Thyroid disease Sister   . Pancreatic cancer Maternal Grandmother   . Lung cancer Maternal Grandmother   . Diabetes Paternal Grandmother   . Breast cancer Paternal Grandmother   . Colon cancer Neg Hx     ROS:  Pertinent items are noted in HPI.  Otherwise, a comprehensive ROS was negative.  Exam:   BP 98/58  Pulse 64  Resp 16  Ht 5' 3.5" (1.613 m)  Wt 129 lb 12.8 oz (58.877 kg)  BMI 22.63 kg/m2  LMP 01/20/2011  Weight change: @WEIGHTCHANGE @ Height:   Height: 5' 3.5" (161.3 cm)  Ht Readings from Last 3 Encounters:  02/05/13 5' 3.5" (1.613 m)  01/30/13 5\' 3"  (1.6 m)  01/26/13 5' 3.5" (1.613 m)    General appearance: alert, cooperative and appears stated age Head: Normocephalic, without obvious abnormality, atraumatic Neck: no adenopathy, supple, symmetrical,  trachea midline and thyroid normal to inspection and palpation Lungs: clear to auscultation bilaterally Breasts: normal appearance, no masses or tenderness Heart: regular rate and rhythm Abdomen: soft, non-tender; bowel sounds normal; no masses,  no organomegaly Extremities: extremities normal, atraumatic, no cyanosis or edema Skin: Skin color, texture, turgor normal. No rashes or lesions Lymph nodes: Cervical, supraclavicular, and axillary nodes normal. No abnormal inguinal nodes palpated Neurologic: Grossly normal   Pelvic: External genitalia:  no lesions              Urethra:  normal appearing urethra with no masses, tenderness or lesions              Bartholins and Skenes: normal                 Vagina: normal appearing vagina with normal color and discharge, no lesions              Cervix: no lesions              Pap taken: yes Bimanual Exam:  Uterus:  normal size, contour, position, consistency, mobility, non-tender              Adnexa: normal adnexa and no mass, fullness, tenderness               Rectovaginal: Confirms               Anus:  normal sphincter tone, no lesions  A:  Well Woman with normal exam PMP, on HRT.  Pt going to try to wean off some this year.   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 Incontinence at end of voiding.  On Detrol.  Has f/u with Dr. Amalia Hailey.  P:   Mammogram yearly.   pap smear only today. Rx for Estradiol 0.5mg  and Provera 5mg  daily.  Pt will take 1/2 of each and give me an update in 4-6 weeks. Pt will call and let me know if she needs a new Valtrex RX. return annually or prn  An After Visit Summary was printed and given to the patient.

## 2013-02-05 NOTE — Patient Instructions (Signed)

## 2013-02-07 LAB — IPS PAP SMEAR ONLY

## 2013-02-08 ENCOUNTER — Telehealth: Payer: Self-pay | Admitting: *Deleted

## 2013-02-08 NOTE — Telephone Encounter (Signed)
Patient called on yesterday asking how long she needs to soak her toenail after having an ingrown toenail procedure.  I returned her call at 5:42pm yesterday and left her a message.  I advised her via voicemail to soak and dress the nail as long as there is drainage.

## 2013-02-09 ENCOUNTER — Telehealth: Payer: Self-pay | Admitting: *Deleted

## 2013-02-09 NOTE — Telephone Encounter (Signed)
Email message,pt asked how long to soak ingrown toenail.  I left a message to soak the toenail for 20 minutes 2 times a day, cover with either a antibiotic ointment bandaid if not given a rx for drops, for 4 to 6 weeks until area gets a dry hard scab.

## 2013-02-13 ENCOUNTER — Ambulatory Visit (INDEPENDENT_AMBULATORY_CARE_PROVIDER_SITE_OTHER): Payer: 59

## 2013-02-13 VITALS — BP 118/78 | HR 68 | Resp 16

## 2013-02-13 DIAGNOSIS — Z9889 Other specified postprocedural states: Secondary | ICD-10-CM

## 2013-02-13 DIAGNOSIS — M1990 Unspecified osteoarthritis, unspecified site: Secondary | ICD-10-CM

## 2013-02-13 DIAGNOSIS — L600 Ingrowing nail: Secondary | ICD-10-CM

## 2013-02-13 NOTE — Patient Instructions (Signed)
ICE INSTRUCTIONS  Apply ice or cold pack to the affected area at least 3 times a day for 10-15 minutes each time.  You should also use ice after prolonged activity or vigorous exercise.  Do not apply ice longer than 20 minutes at one time.  Always keep a cloth between your skin and the ice pack to prevent burns.  Being consistent and following these instructions will help control your symptoms.  We suggest you purchase a gel ice pack because they are reusable and do bit leak.  Some of them are designed to wrap around the area.  Use the method that works best for you.  Here are some other suggestions for icing.   Use a frozen bag of peas or corn-inexpensive and molds well to your body, usually stays frozen for 10 to 20 minutes.  Wet a towel with cold water and squeeze out the excess until it's damp.  Place in a bag in the freezer for 20 minutes. Then remove and use.  To continue to exercise the left great toe joint and up and down motion to help improve flexibility over time continued use of released other 3-6 months followup in the future as needed

## 2013-02-13 NOTE — Progress Notes (Signed)
   Subjective:    Patient ID: Patricia Ryan, female    DOB: 04/14/1955, 58 y.o.   MRN: [id number]  HPI Comments: "My toe is doing much better than it was" (1st toe left-both borders)     Review of Systems no new changes or find     Objective:   Physical Exam Neurovascular status is intact pedal pulses palpable patient is status post AP nail procedure left great toe medial lateral borders still some slight serous drainage Neosporin and Band-Aid applied maintain Neosporin and Band-Aid during the latter dry night patient's blood pressure is also just borderline elevated today 141/61 however on repeat was done toe 118/78 a normal reading. As far as her status post bunion surgery left foot patient has excellent range of motion dorsiflexion approximately 7580 plantar flexion about 5 still stiff advised to continue with active and passive range of motion exercises as needed       Assessment & Plan:  Assessment good postop progress both for bunion surgery left foot 6 months postop as well as status post AP nail procedure left hallux discharge on both counts patient will followup in the future and as-needed basis. He will advised to maintain range of motion exercises of the left great toe joint as needed  Harriet Masson DPM

## 2013-03-02 ENCOUNTER — Ambulatory Visit (HOSPITAL_COMMUNITY)
Admission: RE | Admit: 2013-03-02 | Discharge: 2013-03-02 | Disposition: A | Discharge status: Home / Self Care | Payer: 59 | Source: Ambulatory Visit | Attending: Physician Assistant | Admitting: Physician Assistant

## 2013-03-02 DIAGNOSIS — I10 Essential (primary) hypertension: Secondary | ICD-10-CM | POA: Insufficient documentation

## 2013-03-02 DIAGNOSIS — Z87891 Personal history of nicotine dependence: Secondary | ICD-10-CM | POA: Insufficient documentation

## 2013-03-02 DIAGNOSIS — Z0000 Encounter for general adult medical examination without abnormal findings: Secondary | ICD-10-CM

## 2013-03-05 ENCOUNTER — Other Ambulatory Visit: Payer: Self-pay | Admitting: *Deleted

## 2013-03-05 MED ORDER — PANTOPRAZOLE SODIUM 40 MG PO TBEC: DELAYED_RELEASE_TABLET | ORAL | Status: DC

## 2013-04-25 ENCOUNTER — Ambulatory Visit: Payer: 59 | Admitting: Obstetrics & Gynecology

## 2013-04-27 ENCOUNTER — Ambulatory Visit: Payer: 59 | Admitting: Obstetrics & Gynecology

## 2013-05-01 ENCOUNTER — Encounter: Payer: Self-pay | Admitting: Physician Assistant

## 2013-05-01 ENCOUNTER — Ambulatory Visit (INDEPENDENT_AMBULATORY_CARE_PROVIDER_SITE_OTHER): Payer: 59 | Admitting: Physician Assistant

## 2013-05-01 VITALS — BP 100/60 | HR 72 | Temp 98.1°F | Resp 16 | Wt 129.0 lb

## 2013-05-01 DIAGNOSIS — E559 Vitamin D deficiency, unspecified: Secondary | ICD-10-CM

## 2013-05-01 DIAGNOSIS — R7309 Other abnormal glucose: Secondary | ICD-10-CM

## 2013-05-01 DIAGNOSIS — I10 Essential (primary) hypertension: Secondary | ICD-10-CM

## 2013-05-01 DIAGNOSIS — Z79899 Other long term (current) drug therapy: Secondary | ICD-10-CM

## 2013-05-01 DIAGNOSIS — R7303 Prediabetes: Secondary | ICD-10-CM

## 2013-05-01 DIAGNOSIS — E785 Hyperlipidemia, unspecified: Secondary | ICD-10-CM

## 2013-05-01 LAB — CBC WITH DIFFERENTIAL/PLATELET
BASOS PCT: 1 % (ref 0–1)
Basophils Absolute: 0.1 10*3/uL (ref 0.0–0.1)
EOS ABS: 0.2 10*3/uL (ref 0.0–0.7)
Eosinophils Relative: 3 % (ref 0–5)
HCT: 42.2 % (ref 36.0–46.0)
HEMOGLOBIN: 14.3 g/dL (ref 12.0–15.0)
LYMPHS ABS: 2.1 10*3/uL (ref 0.7–4.0)
Lymphocytes Relative: 41 % (ref 12–46)
MCH: 30.1 pg (ref 26.0–34.0)
MCHC: 33.9 g/dL (ref 30.0–36.0)
MCV: 88.8 fL (ref 78.0–100.0)
MONOS PCT: 10 % (ref 3–12)
Monocytes Absolute: 0.5 10*3/uL (ref 0.1–1.0)
NEUTROS ABS: 2.3 10*3/uL (ref 1.7–7.7)
Neutrophils Relative %: 45 % (ref 43–77)
PLATELETS: 235 10*3/uL (ref 150–400)
RBC: 4.75 MIL/uL (ref 3.87–5.11)
RDW: 13.1 % (ref 11.5–15.5)
WBC: 5 10*3/uL (ref 4.0–10.5)

## 2013-05-01 NOTE — Patient Instructions (Signed)
   Bad carbs also include fruit juice, alcohol, and sweet tea. These are empty calories that do not signal to your brain that you are full.   Please remember the good carbs are still carbs which convert into sugar. So please measure them out no more than 1/2-1 cup of rice, oatmeal, pasta, and beans.  Veggies are however free foods! Pile them on.   I like lean protein at every meal such as chicken, [place], pork chops, cottage cheese, etc. Just do not fry these meats and please center your meal around vegetable, the meats should be a side dish.   No all fruit is created equal. Please see the list below, the fruit at the bottom is higher in sugars than the fruit at the top    To lower your number you can decrease your fatty foods, red meat, cheese, milk and increase fiber like whole grains and veggies.  Benefiber 1-2 TBSP in the morning

## 2013-05-01 NOTE — Progress Notes (Signed)
HPI 58 y.o. female  presents for 3 month follow up with hypertension, hyperlipidemia, prediabetes and vitamin D. Her blood pressure has been controlled at home, today their BP is BP: 100/60 mmHg She does workout. She denies chest pain, shortness of breath, dizziness.  She is on cholesterol medication and denies myalgias. Her cholesterol is at goal. The cholesterol last visit was:   Lab Results  Component Value Date   CHOL 169 01/26/2013   HDL 52 01/26/2013   LDLCALC 99 01/26/2013   TRIG 88 01/26/2013   CHOLHDL 3.3 01/26/2013   She has been working on diet and exercise for prediabetes, and denies polydipsia, polyuria and visual disturbances. Last A1C in the office was:  Lab Results  Component Value Date   HGBA1C 5.8* 01/26/2013   Patient is on Vitamin D supplement.   She is having allergies, states zyrtec does not work, will try Human resources officer.  Xanax PRN, can take for sleep too occ.    Current Medications:  Current Outpatient Prescriptions on File Prior to Visit  Medication Sig Dispense Refill  . ALPRAZolam (XANAX) 0.25 MG tablet Take 1 tablet (0.25 mg total) by mouth 3 (three) times daily as needed for anxiety.  90 tablet  0  . estradiol (ESTRACE) 0.5 MG tablet Take 1 tablet (0.5 mg total) by mouth daily.  30 tablet  13  . Fesoterodine Fumarate (TOVIAZ PO) Take by mouth.      . medroxyPROGESTERone (PROVERA) 5 MG tablet Take 1 tablet (5 mg total) by mouth daily.  30 tablet  13  . pantoprazole (PROTONIX) 40 MG tablet TAKE 1 TABLET (40 MG TOTAL) BY MOUTH DAILY.  90 tablet  0  . rosuvastatin (CRESTOR) 20 MG tablet Take 1 tablet (20 mg total) by mouth daily.  90 tablet  0  . valACYclovir (VALTREX) 500 MG tablet Take 500 mg by mouth 1 day or 1 dose.      . vitamin E 400 UNIT capsule Take 200 Units by mouth daily.        No current facility-administered medications on file prior to visit.   Medical History:  Past Medical History  Diagnosis Date  . Hyperlipidemia   . GERD (gastroesophageal reflux  disease)   . IBS (irritable bowel syndrome)   . Migraines   . Diabetes   . HSV-2 (herpes simplex virus 2) infection   . Anal fissure   . Anxiety   . Interstitial cystitis   . Nevus     vulva nevus   Allergies:  Allergies  Allergen Reactions  . Sulfa Antibiotics Hives, Shortness Of Breath and Rash  . Macrobid [Nitrofurantoin Macrocrystal] Nausea Only     Review of Systems: [X]  = complains of  [ ]  = denies  General: Fatigue [ ]  Fever [ ]  Chills [ ]  Weakness [ ]   Insomnia [ ]  Eyes: Redness [ ]  Blurred vision [ ]  Diplopia [ ]   ENT: Congestion [ ]  Sinus Pain [ ]  Post Nasal Drip [ ]  Sore Throat [ ]  Earache [ ]   Cardiac: Chest pain/pressure [ ]  SOB [ ]  Orthopnea [ ]   Palpitations [ ]   Paroxysmal nocturnal dyspnea[ ]  Claudication [ ]  Edema [ ]   Pulmonary: Cough [ ]  Wheezing[ ]   SOB [ ]   Snoring [ ]   GI: Nausea [ ]  Vomiting[ ]  Dysphagia[ ]  Heartburn[ ]  Abdominal pain [ ]  Constipation [ ] ; Diarrhea [ ] ; BRBPR [ ]  Melena[ ]  GU: Hematuria[ ]  Dysuria [ ]  Nocturia[ ]  Urgency [ ]   Hesitancy [ ]   Discharge [ ]  Neuro: Headaches[ ]  Vertigo[ ]  Paresthesias[ ]  Spasm [ ]  Speech changes [ ]  Incoordination [ ]   Ortho: Arthritis [ ]  Joint pain [ ]  Muscle pain [ ]  Joint swelling [ ]  Back Pain [ ]  Skin:  Rash [ ]   Pruritis [ ]  Change in skin lesion [ ]   Psych: Depression[ ]  Anxiety[ ]  Confusion [ ]  Memory loss [ ]   Heme/Lypmh: Bleeding [ ]  Bruising [ ]  Enlarged lymph nodes [ ]   Endocrine: Visual blurring [ ]  Paresthesia [ ]  Polyuria [ ]  Polydypsea [ ]    Heat/cold intolerance [ ]  Hypoglycemia [ ]   Family history- Review and unchanged Social history- Review and unchanged Physical Exam: BP 100/60  Pulse 72  Temp(Src) 98.1 F (36.7 C)  Resp 16  Wt 129 lb (58.514 kg)  LMP 01/20/2011 Wt Readings from Last 3 Encounters:  05/01/13 129 lb (58.514 kg)  02/05/13 129 lb 12.8 oz (58.877 kg)  01/30/13 130 lb (58.968 kg)   General Appearance: Well nourished, in no apparent distress. Eyes: PERRLA, EOMs,  conjunctiva no swelling or erythema Sinuses: No Frontal/maxillary tenderness ENT/Mouth: Ext aud canals clear, TMs without erythema, bulging. No erythema, swelling, or exudate on post pharynx.  Tonsils not swollen or erythematous. Hearing normal.  Neck: Supple, thyroid normal.  Respiratory: Respiratory effort normal, BS equal bilaterally without rales, rhonchi, wheezing or stridor.  Cardio: RRR with no MRGs. Brisk peripheral pulses without edema.  Abdomen: Soft, + BS.  Non tender, no guarding, rebound, hernias, masses. Lymphatics: Non tender without lymphadenopathy.  Musculoskeletal: Full ROM, 5/5 strength, normal gait.  Skin: Warm, dry without rashes, lesions, ecchymosis.  Neuro: Cranial nerves intact. Normal muscle tone, no cerebellar symptoms. Sensation intact.  Psych: Awake and oriented X 3, normal affect, Insight and Judgment appropriate.   Assessment and Plan:  Hypertension: Continue medication, monitor blood pressure at home. Continue DASH diet. Cholesterol: Continue diet and exercise. Check cholesterol. Has been on Benefiber but on 1-2 tsp Pre-diabetes-Continue diet and exercise. Check A1C Vitamin D Def- check level and continue medications.   Continue diet and meds as discussed. Further disposition pending results of labs.  Vicie Mutters 3:13 PM

## 2013-05-02 LAB — BASIC METABOLIC PANEL WITH GFR
BUN: 13 mg/dL (ref 6–23)
CALCIUM: 9.3 mg/dL (ref 8.4–10.5)
CO2: 25 meq/L (ref 19–32)
Chloride: 107 mEq/L (ref 96–112)
Creat: 0.83 mg/dL (ref 0.50–1.10)
GFR, Est Non African American: 80 mL/min
GLUCOSE: 78 mg/dL (ref 70–99)
POTASSIUM: 4.3 meq/L (ref 3.5–5.3)
SODIUM: 143 meq/L (ref 135–145)

## 2013-05-02 LAB — LIPID PANEL
CHOL/HDL RATIO: 3.6 ratio
CHOLESTEROL: 164 mg/dL (ref 0–200)
HDL: 46 mg/dL (ref >39)
LDL CALC: 91 mg/dL (ref 0–99)
TRIGLYCERIDES: 137 mg/dL (ref <150)
VLDL: 27 mg/dL (ref 0–40)

## 2013-05-02 LAB — HEPATIC FUNCTION PANEL
ALT: 12 U/L (ref 0–35)
AST: 14 U/L (ref 0–37)
Albumin: 4.2 g/dL (ref 3.5–5.2)
Alkaline Phosphatase: 49 U/L (ref 39–117)
BILIRUBIN TOTAL: 0.3 mg/dL (ref 0.2–1.2)
Bilirubin, Direct: <0.1 mg/dL (ref 0.0–0.3)
Total Protein: 6.4 g/dL (ref 6.0–8.3)

## 2013-05-02 LAB — HEMOGLOBIN A1C
Hgb A1c MFr Bld: 5.9 % — ABNORMAL HIGH (ref <5.7)
Mean Plasma Glucose: 123 mg/dL — ABNORMAL HIGH (ref <117)

## 2013-05-02 LAB — MAGNESIUM: MAGNESIUM: 2 mg/dL (ref 1.5–2.5)

## 2013-05-02 LAB — TSH: TSH: 1.502 u[IU]/mL (ref 0.350–4.500)

## 2013-05-02 LAB — VITAMIN D 25 HYDROXY (VIT D DEFICIENCY, FRACTURES): VIT D 25 HYDROXY: 60 ng/mL (ref 30–89)

## 2013-05-02 LAB — INSULIN, FASTING: INSULIN FASTING, SERUM: 18 u[IU]/mL (ref 3–28)

## 2013-05-04 ENCOUNTER — Ambulatory Visit: Payer: 59 | Admitting: Obstetrics & Gynecology

## 2013-06-20 ENCOUNTER — Other Ambulatory Visit: Payer: Self-pay

## 2013-06-20 MED ORDER — VALACYCLOVIR HCL 1 G PO TABS: 1000.0000 mg | ORAL_TABLET | Freq: Every day | ORAL | Status: DC

## 2013-06-20 MED ORDER — PANTOPRAZOLE SODIUM 40 MG PO TBEC: DELAYED_RELEASE_TABLET | ORAL | Status: DC

## 2013-06-26 ENCOUNTER — Encounter: Payer: Self-pay | Admitting: Physician Assistant

## 2013-06-26 ENCOUNTER — Ambulatory Visit (INDEPENDENT_AMBULATORY_CARE_PROVIDER_SITE_OTHER): Payer: 59 | Admitting: Physician Assistant

## 2013-06-26 VITALS — BP 110/62 | HR 60 | Resp 16 | Ht 63.5 in | Wt 130.0 lb

## 2013-06-26 DIAGNOSIS — R1013 Epigastric pain: Secondary | ICD-10-CM

## 2013-06-26 DIAGNOSIS — R197 Diarrhea, unspecified: Secondary | ICD-10-CM

## 2013-06-26 DIAGNOSIS — K219 Gastro-esophageal reflux disease without esophagitis: Secondary | ICD-10-CM

## 2013-06-26 LAB — BASIC METABOLIC PANEL WITH GFR
BUN: 15 mg/dL (ref 6–23)
CO2: 25 mEq/L (ref 19–32)
Calcium: 9.2 mg/dL (ref 8.4–10.5)
Chloride: 106 mEq/L (ref 96–112)
Creat: 0.73 mg/dL (ref 0.50–1.10)
GFR, Est Non African American: >89 mL/min
GLUCOSE: 102 mg/dL — AB (ref 70–99)
POTASSIUM: 4.2 meq/L (ref 3.5–5.3)
Sodium: 141 mEq/L (ref 135–145)

## 2013-06-26 LAB — CBC WITH DIFFERENTIAL/PLATELET
Basophils Absolute: 0.1 10*3/uL (ref 0.0–0.1)
Basophils Relative: 1 % (ref 0–1)
Eosinophils Absolute: 0.1 10*3/uL (ref 0.0–0.7)
Eosinophils Relative: 2 % (ref 0–5)
HCT: 38.5 % (ref 36.0–46.0)
HEMOGLOBIN: 13 g/dL (ref 12.0–15.0)
LYMPHS ABS: 1.2 10*3/uL (ref 0.7–4.0)
Lymphocytes Relative: 24 % (ref 12–46)
MCH: 30.2 pg (ref 26.0–34.0)
MCHC: 33.8 g/dL (ref 30.0–36.0)
MCV: 89.5 fL (ref 78.0–100.0)
MONOS PCT: 10 % (ref 3–12)
Monocytes Absolute: 0.5 10*3/uL (ref 0.1–1.0)
NEUTROS ABS: 3.3 10*3/uL (ref 1.7–7.7)
NEUTROS PCT: 63 % (ref 43–77)
PLATELETS: 265 10*3/uL (ref 150–400)
RBC: 4.3 MIL/uL (ref 3.87–5.11)
RDW: 13.6 % (ref 11.5–15.5)
WBC: 5.2 10*3/uL (ref 4.0–10.5)

## 2013-06-26 LAB — AMYLASE: AMYLASE: 32 U/L (ref 0–105)

## 2013-06-26 LAB — HEPATIC FUNCTION PANEL
ALT: 11 U/L (ref 0–35)
AST: 13 U/L (ref 0–37)
Albumin: 4.1 g/dL (ref 3.5–5.2)
Alkaline Phosphatase: 50 U/L (ref 39–117)
BILIRUBIN INDIRECT: 0.3 mg/dL (ref 0.2–1.2)
BILIRUBIN TOTAL: 0.4 mg/dL (ref 0.2–1.2)
Bilirubin, Direct: 0.1 mg/dL (ref 0.0–0.3)
Total Protein: 6.4 g/dL (ref 6.0–8.3)

## 2013-06-26 NOTE — Progress Notes (Signed)
   Subjective:    Patient ID: Patricia Ryan, female    DOB: Sep 26, 1955, 58 y.o.   MRN: [id number]  HPI 58 y.o. female presents with history of 2 weeks of not feeling well. She states she has had epigastric pain with burning into her throat and to her back. She has vomited one day 3 times. She had constipation in the beginning but then she has had diarrhea, yellow in color no blood for the past 3 days. She has been taking pepto, zantac, prilosec once in addition to her daily protonix, and carafate. She states she is feeling better today.  She did take some ibuprofen last week, she also drinks 3-4 times a week, had wine this past Saturday.   Last Colon/EGD- 06/2012 showed gastritis neg Hyplori.    Review of Systems  Constitutional: Negative for fever, chills, diaphoresis, activity change, appetite change, fatigue and unexpected weight change.  Respiratory: Negative.   Cardiovascular: Negative.   Gastrointestinal: Positive for nausea, vomiting, abdominal pain, diarrhea and constipation. Negative for blood in stool, anal bleeding and rectal pain.  Genitourinary: Positive for dysuria. Negative for frequency, hematuria, flank pain, difficulty urinating, genital sores and dyspareunia.  Musculoskeletal: Negative.   Neurological: Negative.        Objective:   Physical Exam  Constitutional: She is oriented to person, place, and time. She appears well-developed and well-nourished.  HENT:  Head: Normocephalic and atraumatic.  Right Ear: External ear normal.  Left Ear: External ear normal.  Mouth/Throat: Oropharynx is clear and moist.  Eyes: Conjunctivae and EOM are normal. Pupils are equal, round, and reactive to light.  Neck: Normal range of motion. Neck supple. No thyromegaly present.  Cardiovascular: Normal rate, regular rhythm and normal heart sounds.  Exam reveals no gallop and no friction rub.   No murmur heard. Pulmonary/Chest: Effort normal and breath sounds normal. No respiratory  distress. She has no wheezes.  Abdominal: Soft. Bowel sounds are normal. She exhibits no shifting dullness, no distension, no abdominal bruit, no pulsatile midline mass and no mass. There is no hepatosplenomegaly. There is tenderness in the epigastric area and left lower quadrant. There is no rigidity, no rebound, no guarding, no CVA tenderness, no tenderness at McBurney's point and negative Murphy's sign. No hernia.  Musculoskeletal: Normal range of motion.  Lymphadenopathy:    She has no cervical adenopathy.  Neurological: She is alert and oriented to person, place, and time.  Skin: Skin is warm and dry.  Psychiatric: She has a normal mood and affect.      Assessment & Plan:  Epigastric pain:? infection, ulcer ,pancreatitis-  check labs, bland diet, small portions, increase H20, PPI Avoid NSAIDS, alcohol, and voids that can worsen GERD, info given if pain continues will return for abdominal U/S.  Patient advised to go to the ER if the symptoms increase or worsen.

## 2013-06-26 NOTE — Patient Instructions (Signed)

## 2013-06-28 LAB — HELICOBACTER PYLORI ABS-IGG+IGA, BLD
H Pylori IgG: 0.52 {ISR}
HELICOBACTER PYLORI AB, IGA: 2.3 U/mL (ref <9.0)

## 2013-07-13 ENCOUNTER — Telehealth: Payer: Self-pay | Admitting: *Deleted

## 2013-07-13 MED ORDER — ESOMEPRAZOLE MAGNESIUM 40 MG PO CPDR: 40.0000 mg | DELAYED_RELEASE_CAPSULE | Freq: Every day | ORAL | Status: DC

## 2013-07-13 NOTE — Telephone Encounter (Signed)
PLEASE SEND   [facility] [address]  NOT [facility] = PT AWARE

## 2013-07-13 NOTE — Telephone Encounter (Signed)
Pt has taken the 14 days of nexium was feeling good said that she went back to her normal med & symptoms came back.  Pt wants to know can she just stay on Nexium ? It works for her if so can we call in a rx? Says it cost less if she has a RX?

## 2013-07-20 ENCOUNTER — Telehealth: Payer: Self-pay | Admitting: Obstetrics & Gynecology

## 2013-07-20 NOTE — Telephone Encounter (Signed)
Spoke with patient. She feels she is having dysuria that started this morning and states "I can usually tell pretty quickly if its a uti." She was advised that she needs an office visit for evaluation for best treatment but does not want to wait as she is leaving for the beach tomorrow morning. She has an urgent care really close to her home and will go there instead. Advised would send a message to Dr. Sabra Heck to let her know that she was having symptoms and that she will go to urgent care. Patient is agreeable to plan.   Routing to Gertrue Willette for final review. Patient agreeable to disposition. Will close encounter

## 2013-07-20 NOTE — Telephone Encounter (Signed)
Patient is calling said she thinks she has a UTI she is leaving for the beach in the morning and wants to know if we would just call her something in. If not she said she would just go to the urgent care.

## 2013-07-29 DIAGNOSIS — T7840XA Allergy, unspecified, initial encounter: Secondary | ICD-10-CM | POA: Insufficient documentation

## 2013-07-29 DIAGNOSIS — D219 Benign neoplasm of connective and other soft tissue, unspecified: Secondary | ICD-10-CM | POA: Insufficient documentation

## 2013-07-29 DIAGNOSIS — R7309 Other abnormal glucose: Secondary | ICD-10-CM | POA: Insufficient documentation

## 2013-07-29 DIAGNOSIS — G43909 Migraine, unspecified, not intractable, without status migrainosus: Secondary | ICD-10-CM | POA: Insufficient documentation

## 2013-07-29 DIAGNOSIS — K589 Irritable bowel syndrome without diarrhea: Secondary | ICD-10-CM | POA: Insufficient documentation

## 2013-07-29 DIAGNOSIS — E785 Hyperlipidemia, unspecified: Secondary | ICD-10-CM | POA: Insufficient documentation

## 2013-08-01 ENCOUNTER — Ambulatory Visit (INDEPENDENT_AMBULATORY_CARE_PROVIDER_SITE_OTHER): Payer: 59 | Admitting: Physician Assistant

## 2013-08-01 ENCOUNTER — Encounter: Payer: Self-pay | Admitting: Physician Assistant

## 2013-08-01 ENCOUNTER — Other Ambulatory Visit: Payer: Self-pay

## 2013-08-01 VITALS — BP 110/68 | HR 56 | Temp 97.7°F | Resp 16 | Wt 127.0 lb

## 2013-08-01 DIAGNOSIS — Z79899 Other long term (current) drug therapy: Secondary | ICD-10-CM

## 2013-08-01 DIAGNOSIS — E559 Vitamin D deficiency, unspecified: Secondary | ICD-10-CM

## 2013-08-01 DIAGNOSIS — E785 Hyperlipidemia, unspecified: Secondary | ICD-10-CM

## 2013-08-01 DIAGNOSIS — R7303 Prediabetes: Secondary | ICD-10-CM

## 2013-08-01 DIAGNOSIS — R7309 Other abnormal glucose: Secondary | ICD-10-CM

## 2013-08-01 DIAGNOSIS — N3000 Acute cystitis without hematuria: Secondary | ICD-10-CM

## 2013-08-01 LAB — CBC WITH DIFFERENTIAL/PLATELET
BASOS PCT: 1 % (ref 0–1)
Basophils Absolute: 0 10*3/uL (ref 0.0–0.1)
EOS ABS: 0.1 10*3/uL (ref 0.0–0.7)
Eosinophils Relative: 3 % (ref 0–5)
HCT: 39.7 % (ref 36.0–46.0)
HEMOGLOBIN: 13.6 g/dL (ref 12.0–15.0)
Lymphocytes Relative: 33 % (ref 12–46)
Lymphs Abs: 1.4 10*3/uL (ref 0.7–4.0)
MCH: 30.4 pg (ref 26.0–34.0)
MCHC: 34.3 g/dL (ref 30.0–36.0)
MCV: 88.6 fL (ref 78.0–100.0)
MONO ABS: 0.4 10*3/uL (ref 0.1–1.0)
Monocytes Relative: 10 % (ref 3–12)
NEUTROS PCT: 53 % (ref 43–77)
Neutro Abs: 2.2 10*3/uL (ref 1.7–7.7)
Platelets: 205 10*3/uL (ref 150–400)
RBC: 4.48 MIL/uL (ref 3.87–5.11)
RDW: 13.7 % (ref 11.5–15.5)
WBC: 4.1 10*3/uL (ref 4.0–10.5)

## 2013-08-01 LAB — HEMOGLOBIN A1C
Hgb A1c MFr Bld: 5.5 % (ref <5.7)
MEAN PLASMA GLUCOSE: 111 mg/dL (ref <117)

## 2013-08-01 LAB — HEPATIC FUNCTION PANEL
ALBUMIN: 4.5 g/dL (ref 3.5–5.2)
ALT: 13 U/L (ref 0–35)
AST: 16 U/L (ref 0–37)
Alkaline Phosphatase: 51 U/L (ref 39–117)
Bilirubin, Direct: 0.1 mg/dL (ref 0.0–0.3)
Indirect Bilirubin: 0.4 mg/dL (ref 0.2–1.2)
TOTAL PROTEIN: 6.7 g/dL (ref 6.0–8.3)
Total Bilirubin: 0.5 mg/dL (ref 0.2–1.2)

## 2013-08-01 LAB — LIPID PANEL
CHOLESTEROL: 161 mg/dL (ref 0–200)
HDL: 44 mg/dL (ref >39)
LDL Cholesterol: 104 mg/dL — ABNORMAL HIGH (ref 0–99)
TRIGLYCERIDES: 65 mg/dL (ref <150)
Total CHOL/HDL Ratio: 3.7 Ratio
VLDL: 13 mg/dL (ref 0–40)

## 2013-08-01 LAB — BASIC METABOLIC PANEL WITH GFR
BUN: 13 mg/dL (ref 6–23)
CO2: 25 meq/L (ref 19–32)
CREATININE: 0.7 mg/dL (ref 0.50–1.10)
Calcium: 9.3 mg/dL (ref 8.4–10.5)
Chloride: 109 mEq/L (ref 96–112)
GFR, Est Non African American: >89 mL/min
Glucose, Bld: 89 mg/dL (ref 70–99)
Potassium: 4.4 mEq/L (ref 3.5–5.3)
SODIUM: 143 meq/L (ref 135–145)

## 2013-08-01 LAB — TSH: TSH: 1.187 u[IU]/mL (ref 0.350–4.500)

## 2013-08-01 LAB — MAGNESIUM: Magnesium: 2.1 mg/dL (ref 1.5–2.5)

## 2013-08-01 MED ORDER — ESOMEPRAZOLE MAGNESIUM 40 MG PO CPDR: 40.0000 mg | DELAYED_RELEASE_CAPSULE | Freq: Every day | ORAL | Status: DC

## 2013-08-01 NOTE — Progress Notes (Signed)
Assessment and Plan:  Hypertension: Continue medication, monitor blood pressure at home. Continue DASH diet. Cholesterol: Continue diet and exercise. Check cholesterol.  Pre-diabetes-Continue diet and exercise. Check A1C Vitamin D Def- check level and continue medications.  Recheck UTI GERD- will try to do taper off with H2 blockers, if continuing GERD, may need referral to GI.   Continue diet and meds as discussed. Further disposition pending results of labs.  HPI 58 y.o. female  presents for 3 month follow up with hypertension, hyperlipidemia, prediabetes and vitamin D. Her blood pressure has been controlled at home, today their BP is BP: 110/68 mmHg She does workout. She denies chest pain, shortness of breath, dizziness.  She is on cholesterol medication and denies myalgias. Her cholesterol is at goal. The cholesterol last visit was:   Lab Results  Component Value Date   CHOL 164 05/01/2013   HDL 46 05/01/2013   LDLCALC 91 05/01/2013   TRIG 137 05/01/2013   CHOLHDL 3.6 05/01/2013   She has been working on diet and exercise for prediabetes, and denies paresthesia of the feet, polydipsia, polyuria and visual disturbances. Last A1C in the office was:  Lab Results  Component Value Date   HGBA1C 5.9* 05/01/2013   Patient is on Vitamin D supplement.   Lab Results  Component Value Date   VD25OH 60 05/01/2013     Patient went to an urgent care in June for UTI, was given Cipro and does not have any more symptoms.   Current Medications:  Current Outpatient Prescriptions on File Prior to Visit  Medication Sig Dispense Refill  . ALPRAZolam (XANAX) 0.25 MG tablet Take 1 tablet (0.25 mg total) by mouth 3 (three) times daily as needed for anxiety.  90 tablet  0  . esomeprazole (NEXIUM) 40 MG capsule Take 1 capsule (40 mg total) by mouth daily.  90 capsule  1  . estradiol (ESTRACE) 0.5 MG tablet Take 1 tablet (0.5 mg total) by mouth daily.  30 tablet  13  . medroxyPROGESTERone (PROVERA) 5 MG tablet  Take 1 tablet (5 mg total) by mouth daily.  30 tablet  13  . OXYBUTYNIN CHLORIDE PO Take by mouth daily.      . pantoprazole (PROTONIX) 40 MG tablet TAKE 1 TABLET (40 MG TOTAL) BY MOUTH DAILY.  90 tablet  3  . rosuvastatin (CRESTOR) 20 MG tablet Take 1 tablet (20 mg total) by mouth daily.  90 tablet  0  . valACYclovir (VALTREX) 1000 MG tablet Take 1 tablet (1,000 mg total) by mouth daily.  90 tablet  3  . vitamin E 400 UNIT capsule Take 200 Units by mouth daily.        No current facility-administered medications on file prior to visit.   Medical History:  Past Medical History  Diagnosis Date  . GERD (gastroesophageal reflux disease)   . IBS (irritable bowel syndrome)   . Migraines   . Diabetes   . HSV-2 (herpes simplex virus 2) infection   . Anal fissure   . Anxiety   . Interstitial cystitis   . Nevus     vulva nevus  . Type II or unspecified type diabetes mellitus without mention of complication, not stated as uncontrolled   . Allergy   . Prediabetes   . Migraines   . Hyperlipidemia   . Fibroids     Uterine  . Prediabetes    Allergies:  Allergies  Allergen Reactions  . Sulfa Antibiotics Hives, Shortness Of Breath and Rash  .  Macrobid [Nitrofurantoin Macrocrystal] Nausea Only     Review of Systems: [X]  = complains of  [ ]  = denies  General: Fatigue [ ]  Fever [ ]  Chills [ ]  Weakness [ ]   Insomnia [ ]  Eyes: Redness [ ]  Blurred vision [ ]  Diplopia [ ]   ENT: Congestion [ ]  Sinus Pain [ ]  Post Nasal Drip [ ]  Sore Throat [ ]  Earache [ ]   Cardiac: Chest pain/pressure [ ]  SOB [ ]  Orthopnea [ ]   Palpitations [ ]   Paroxysmal nocturnal dyspnea[ ]  Claudication [ ]  Edema [ ]   Pulmonary: Cough [ ]  Wheezing[ ]   SOB [ ]   Snoring [ ]   GI: Nausea [ ]  Vomiting[ ]  Dysphagia[ ]  Heartburn[X ] Abdominal pain [ ]  Constipation [ ] ; Diarrhea [ ] ; BRBPR [ ]  Melena[ ]  GU: Hematuria[ ]  Dysuria [ ]  Nocturia[ ]  Urgency [ ]   Hesitancy [ ]  Discharge [ ]  Neuro: Headaches[ ]  Vertigo[ ]  Paresthesias[ ]   Spasm [ ]  Speech changes [ ]  Incoordination [ ]   Ortho: Arthritis [ ]  Joint pain [ ]  Muscle pain [ ]  Joint swelling [ ]  Back Pain [ ]  Skin:  Rash [ ]   Pruritis [ ]  Change in skin lesion [ ]   Psych: Depression[ ]  Anxiety[ ]  Confusion [ ]  Memory loss [ ]   Heme/Lypmh: Bleeding [ ]  Bruising [ ]  Enlarged lymph nodes [ ]   Endocrine: Visual blurring [ ]  Paresthesia [ ]  Polyuria [ ]  Polydypsea [ ]    Heat/cold intolerance [ ]  Hypoglycemia [ ]   Family history- Review and unchanged Social history- Review and unchanged Physical Exam: BP 110/68  Pulse 56  Temp(Src) 97.7 F (36.5 C)  Resp 16  Wt 127 lb (57.607 kg)  LMP 01/20/2011 Wt Readings from Last 3 Encounters:  08/01/13 127 lb (57.607 kg)  06/26/13 130 lb (58.968 kg)  05/01/13 129 lb (58.514 kg)   General Appearance: Well nourished, in no apparent distress. Eyes: PERRLA, EOMs, conjunctiva no swelling or erythema Sinuses: No Frontal/maxillary tenderness ENT/Mouth: Ext aud canals clear, TMs without erythema, bulging. No erythema, swelling, or exudate on post pharynx.  Tonsils not swollen or erythematous. Hearing normal.  Neck: Supple, thyroid normal.  Respiratory: Respiratory effort normal, BS equal bilaterally without rales, rhonchi, wheezing or stridor.  Cardio: RRR with no MRGs. Brisk peripheral pulses without edema.  Abdomen: Soft, + BS.  Non tender, no guarding, rebound, hernias, masses. Lymphatics: Non tender without lymphadenopathy.  Musculoskeletal: Full ROM, 5/5 strength, normal gait.  Skin: Warm, dry without rashes, lesions, ecchymosis.  Neuro: Cranial nerves intact. Normal muscle tone, no cerebellar symptoms. Sensation intact.  Psych: Awake and oriented X 3, normal affect, Insight and Judgment appropriate.    Vicie Mutters 9:53 AM

## 2013-08-01 NOTE — Patient Instructions (Signed)
Take the nexium/protonix for 2-4 more weeks once a day, then switch to pepcid or zantac (generic is fine) 2 x a day for 2 weeks, then once a day for 2 weeks and then stop. Avoid alcohol, spicy foods, NSAIDS (aleve, ibuprofen) at this time. See foods below.   Food Choices for Gastroesophageal Reflux Disease When you have gastroesophageal reflux disease (GERD), the foods you eat and your eating habits are very important. Choosing the right foods can help ease the discomfort of GERD. WHAT GENERAL GUIDELINES DO I NEED TO FOLLOW?  Choose fruits, vegetables, whole grains, low-fat dairy products, and low-fat meat, fish, and poultry.  Limit fats such as oils, salad dressings, butter, nuts, and avocado.  Keep a food diary to identify foods that cause symptoms.  Avoid foods that cause reflux. These may be different for different people.  Eat frequent small meals instead of three large meals each day.  Eat your meals slowly, in a relaxed setting.  Limit fried foods.  Cook foods using methods other than frying.  Avoid drinking alcohol.  Avoid drinking large amounts of liquids with your meals.  Avoid bending over or lying down until 2-3 hours after eating. WHAT FOODS ARE NOT RECOMMENDED? The following are some foods and drinks that may worsen your symptoms: Vegetables Tomatoes. Tomato juice. Tomato and spaghetti sauce. Chili peppers. Onion and garlic. Horseradish. Fruits Oranges, grapefruit, and lemon (fruit and juice). Meats High-fat meats, fish, and poultry. This includes hot dogs, ribs, ham, sausage, salami, and bacon. Dairy Whole milk and chocolate milk. Sour cream. Cream. Butter. Ice cream. Cream cheese.  Beverages Coffee and tea, with or without caffeine. Carbonated beverages or energy drinks. Condiments Hot sauce. Barbecue sauce.  Sweets/Desserts Chocolate and cocoa. Donuts. Peppermint and spearmint. Fats and Oils High-fat foods, including [place] fries and potato  chips. Other Vinegar. Strong spices, such as black pepper, white pepper, red pepper, cayenne, curry powder, cloves, ginger, and chili powder. The items listed above may not be a complete list of foods and beverages to avoid. Contact your dietitian for more information. Document Released: 01/11/2005 Document Revised: 01/16/2013 Document Reviewed: 11/15/2012 [facility] Patient Information 2015 [place], [place]. This information is not intended to replace advice given to you by your health care provider. Make sure you discuss any questions you have with your health care provider.

## 2013-08-02 ENCOUNTER — Other Ambulatory Visit: Payer: Self-pay

## 2013-08-02 LAB — URINALYSIS, ROUTINE W REFLEX MICROSCOPIC
Bilirubin Urine: NEGATIVE
Glucose, UA: NEGATIVE mg/dL
HGB URINE DIPSTICK: NEGATIVE
Ketones, ur: NEGATIVE mg/dL
Leukocytes, UA: NEGATIVE
Nitrite: NEGATIVE
Protein, ur: NEGATIVE mg/dL
Specific Gravity, Urine: 1.006 (ref 1.005–1.030)
UROBILINOGEN UA: 0.2 mg/dL (ref 0.0–1.0)
pH: 5.5 (ref 5.0–8.0)

## 2013-08-02 LAB — URINE CULTURE
COLONY COUNT: NO GROWTH
Organism ID, Bacteria: NO GROWTH

## 2013-08-02 LAB — VITAMIN D 25 HYDROXY (VIT D DEFICIENCY, FRACTURES): VIT D 25 HYDROXY: 75 ng/mL (ref 30–89)

## 2013-08-02 MED ORDER — ESOMEPRAZOLE MAGNESIUM 40 MG PO CPDR
40.0000 mg | DELAYED_RELEASE_CAPSULE | Freq: Every day | ORAL | Status: DC
Start: 2013-08-02 — End: 2016-01-09

## 2013-08-03 ENCOUNTER — Other Ambulatory Visit: Payer: Self-pay

## 2013-08-03 MED ORDER — OMEPRAZOLE 40 MG PO CPDR: 40.0000 mg | DELAYED_RELEASE_CAPSULE | Freq: Every day | ORAL | Status: DC

## 2013-08-21 ENCOUNTER — Telehealth: Payer: Self-pay | Admitting: *Deleted

## 2013-08-21 MED ORDER — CELECOXIB 200 MG PO CAPS: 200.0000 mg | ORAL_CAPSULE | Freq: Every day | ORAL | Status: DC

## 2013-08-21 NOTE — Telephone Encounter (Signed)
I'd like to have a refill of the Celebrex, prescription ran out.  Haven't taken it for several months now.  I'm having little bit of pain wanna try Celebrex.  [facility] [address]  Leave a message if I don't answer.  I have a prescription card to get it for $4 if you want to mention that when you call it in.  I called and left the patient a message that Dr. Noralyn Pick the refill.  Should be able to pick it up this afternoon.

## 2013-08-22 ENCOUNTER — Other Ambulatory Visit: Payer: Self-pay

## 2013-08-22 ENCOUNTER — Telehealth: Payer: Self-pay | Admitting: *Deleted

## 2013-08-22 NOTE — Telephone Encounter (Signed)
Had a message yesterday that the Celebrex was going to be called in.  I went to the pharmacy and it wasn't there.  The pharmacy said they were going to send a request.  [place].  Call it in today please.

## 2013-08-22 NOTE — Telephone Encounter (Signed)
I called and apologized for her not getting it yesterday.  I e-scribed it but apparently there was a Emergency planning/management officer.  I told her I responded to the pharmacy request, should be able to pick it up today.  She stated okay thank you.

## 2013-11-07 ENCOUNTER — Ambulatory Visit: Payer: Self-pay | Admitting: Physician Assistant

## 2013-11-08 ENCOUNTER — Ambulatory Visit: Payer: Self-pay | Admitting: Physician Assistant

## 2013-11-26 ENCOUNTER — Encounter: Payer: Self-pay | Admitting: Physician Assistant

## 2013-11-29 ENCOUNTER — Ambulatory Visit (INDEPENDENT_AMBULATORY_CARE_PROVIDER_SITE_OTHER): Payer: 59 | Admitting: Physician Assistant

## 2013-11-29 ENCOUNTER — Encounter: Payer: Self-pay | Admitting: Physician Assistant

## 2013-11-29 VITALS — BP 110/68 | HR 64 | Temp 98.1°F | Resp 16 | Ht 63.5 in | Wt 124.0 lb

## 2013-11-29 DIAGNOSIS — E785 Hyperlipidemia, unspecified: Secondary | ICD-10-CM

## 2013-11-29 DIAGNOSIS — Z79899 Other long term (current) drug therapy: Secondary | ICD-10-CM

## 2013-11-29 DIAGNOSIS — R7309 Other abnormal glucose: Secondary | ICD-10-CM

## 2013-11-29 DIAGNOSIS — R7303 Prediabetes: Secondary | ICD-10-CM

## 2013-11-29 DIAGNOSIS — E559 Vitamin D deficiency, unspecified: Secondary | ICD-10-CM

## 2013-11-29 LAB — CBC WITH DIFFERENTIAL/PLATELET
BASOS ABS: 0 10*3/uL (ref 0.0–0.1)
Basophils Relative: 1 % (ref 0–1)
Eosinophils Absolute: 0.1 10*3/uL (ref 0.0–0.7)
Eosinophils Relative: 2 % (ref 0–5)
HCT: 41.5 % (ref 36.0–46.0)
Hemoglobin: 13.7 g/dL (ref 12.0–15.0)
Lymphocytes Relative: 29 % (ref 12–46)
Lymphs Abs: 1.3 10*3/uL (ref 0.7–4.0)
MCH: 30.6 pg (ref 26.0–34.0)
MCHC: 33 g/dL (ref 30.0–36.0)
MCV: 92.8 fL (ref 78.0–100.0)
Monocytes Absolute: 0.5 10*3/uL (ref 0.1–1.0)
Monocytes Relative: 10 % (ref 3–12)
Neutro Abs: 2.6 10*3/uL (ref 1.7–7.7)
Neutrophils Relative %: 58 % (ref 43–77)
Platelets: 221 10*3/uL (ref 150–400)
RBC: 4.47 MIL/uL (ref 3.87–5.11)
RDW: 13.8 % (ref 11.5–15.5)
WBC: 4.5 10*3/uL (ref 4.0–10.5)

## 2013-11-29 MED ORDER — ALPRAZOLAM 0.25 MG PO TABS: 0.2500 mg | ORAL_TABLET | Freq: Three times a day (TID) | ORAL | Status: DC | PRN

## 2013-11-29 NOTE — Progress Notes (Signed)
Assessment and Plan:  Hypertension: Continue medication, monitor blood pressure at home. Continue DASH diet.  Reminder to go to the ER if any CP, SOB, nausea, dizziness, severe HA, changes vision/speech, left arm numbness and tingling, and jaw pain. Cholesterol: Continue diet and exercise. Check cholesterol.  Pre-diabetes-Continue diet and exercise. Check A1C Vitamin D Def- check level and continue medications.  GERD- tried to get off nexium but could not, will continue for now  Continue diet and meds as discussed. Further disposition pending results of labs.  HPI 58 y.o. female  presents for 3 month follow up with hypertension, hyperlipidemia, prediabetes and vitamin D. Her blood pressure has been controlled at home, today their BP is BP: 110/68 mmHg She does workout. She denies chest pain, shortness of breath, dizziness.  She is on cholesterol medication, crestor 1/2 every other day and denies myalgias. Her cholesterol is at goal. The cholesterol last visit was:   Lab Results  Component Value Date   CHOL 161 08/01/2013   HDL 44 08/01/2013   LDLCALC 104* 08/01/2013   TRIG 65 08/01/2013   CHOLHDL 3.7 08/01/2013   She has been working on diet and exercise for prediabetes, and denies paresthesia of the feet, polydipsia, polyuria and visual disturbances. Last A1C in the office was:  Lab Results  Component Value Date   HGBA1C 5.5 08/01/2013   Patient is on Vitamin D supplement.   Lab Results  Component Value Date   VD25OH 10 08/01/2013     She is on estrogen and progesterone for hot flashes, she is not on bASA, low risk factors for DVT.   Current Medications:  Current Outpatient Prescriptions on File Prior to Visit  Medication Sig Dispense Refill  . ALPRAZolam (XANAX) 0.25 MG tablet Take 1 tablet (0.25 mg total) by mouth 3 (three) times daily as needed for anxiety. 90 tablet 0  . esomeprazole (NEXIUM) 40 MG capsule Take 1 capsule (40 mg total) by mouth daily. 30 capsule 0  .  estradiol (ESTRACE) 0.5 MG tablet Take 1 tablet (0.5 mg total) by mouth daily. 30 tablet 13  . medroxyPROGESTERone (PROVERA) 5 MG tablet Take 1 tablet (5 mg total) by mouth daily. 30 tablet 13  . OXYBUTYNIN CHLORIDE PO Take by mouth daily.    . rosuvastatin (CRESTOR) 20 MG tablet Take 1 tablet (20 mg total) by mouth daily. 90 tablet 0  . valACYclovir (VALTREX) 1000 MG tablet Take 1 tablet (1,000 mg total) by mouth daily. 90 tablet 3  . vitamin E 400 UNIT capsule Take 200 Units by mouth daily.      No current facility-administered medications on file prior to visit.   Medical History:  Past Medical History  Diagnosis Date  . GERD (gastroesophageal reflux disease)   . IBS (irritable bowel syndrome)   . Migraines   . Diabetes   . HSV-2 (herpes simplex virus 2) infection   . Anal fissure   . Anxiety   . Interstitial cystitis   . Nevus     vulva nevus  . Type II or unspecified type diabetes mellitus without mention of complication, not stated as uncontrolled   . Allergy   . Prediabetes   . Migraines   . Hyperlipidemia   . Fibroids     Uterine  . Prediabetes    Allergies:  Allergies  Allergen Reactions  . Sulfa Antibiotics Hives, Shortness Of Breath and Rash  . Macrobid [Nitrofurantoin Macrocrystal] Nausea Only     Review of Systems: [X]  = complains  of  [ ]  = denies  General: Fatigue [ ]  Fever [ ]  Chills [ ]  Weakness [ ]   Insomnia [ ]  Eyes: Redness [ ]  Blurred vision [ ]  Diplopia [ ]   ENT: Congestion [ ]  Sinus Pain [ ]  Post Nasal Drip [ ]  Sore Throat [ ]  Earache [ ]   Cardiac: Chest pain/pressure [ ]  SOB [ ]  Orthopnea [ ]   Palpitations [ ]   Paroxysmal nocturnal dyspnea[ ]  Claudication [ ]  Edema [ ]   Pulmonary: Cough [ ]  Wheezing[ ]   SOB [ ]   Snoring [ ]   GI: Nausea [ ]  Vomiting[ ]  Dysphagia[ ]  Heartburn[ ]  Abdominal pain [ ]  Constipation [ ] ; Diarrhea [ ] ; BRBPR [ ]  Melena[ ]  GU: Hematuria[ ]  Dysuria [ ]  Nocturia[ ]  Urgency [ ]   Hesitancy [ ]  Discharge [ ]  Neuro: Headaches[ ]   Vertigo[ ]  Paresthesias[ ]  Spasm [ ]  Speech changes [ ]  Incoordination [ ]   Ortho: Arthritis [ ]  Joint pain [ ]  Muscle pain [ ]  Joint swelling [ ]  Back Pain [ ]  Skin:  Rash [ ]   Pruritis [ ]  Change in skin lesion [ ]   Psych: Depression[ ]  Anxiety[ ]  Confusion [ ]  Memory loss [ ]   Heme/Lypmh: Bleeding [ ]  Bruising [ ]  Enlarged lymph nodes [ ]   Endocrine: Visual blurring [ ]  Paresthesia [ ]  Polyuria [ ]  Polydypsea [ ]    Heat/cold intolerance [ ]  Hypoglycemia [ ]   Family history- Review and unchanged Social history- Review and unchanged Physical Exam: BP 110/68 mmHg  Pulse 64  Temp(Src) 98.1 F (36.7 C)  Resp 16  Ht 5' 3.5" (1.613 m)  Wt 124 lb (56.246 kg)  BMI 21.62 kg/m2  LMP 01/20/2011 Wt Readings from Last 3 Encounters:  11/29/13 124 lb (56.246 kg)  08/01/13 127 lb (57.607 kg)  06/26/13 130 lb (58.968 kg)   General Appearance: Well nourished, in no apparent distress. Eyes: PERRLA, EOMs, conjunctiva no swelling or erythema Sinuses: No Frontal/maxillary tenderness ENT/Mouth: Ext aud canals clear, TMs without erythema, bulging. No erythema, swelling, or exudate on post pharynx.  Tonsils not swollen or erythematous. Hearing normal.  Neck: Supple, thyroid normal.  Respiratory: Respiratory effort normal, BS equal bilaterally without rales, rhonchi, wheezing or stridor.  Cardio: RRR with no MRGs. Brisk peripheral pulses without edema.  Abdomen: Soft, + BS.  Non tender, no guarding, rebound, hernias, masses. Lymphatics: Non tender without lymphadenopathy.  Musculoskeletal: Full ROM, 5/5 strength, normal gait.  Skin: Warm, dry without rashes, lesions, ecchymosis.  Neuro: Cranial nerves intact. Normal muscle tone, no cerebellar symptoms. Sensation intact.  Psych: Awake and oriented X 3, normal affect, Insight and Judgment appropriate.    Vicie Mutters, PA-C 11:01 AM [facility]

## 2013-11-29 NOTE — Patient Instructions (Signed)
Benefiber is good for constipation/diarrhea/irritable bowel syndrome, it helps with weight loss and can help lower your bad cholesterol. Please do 1-2 TBSP in the morning in water, coffee, or tea. It can take up to a month before you can see a difference with your bowel movements. It is cheapest from [employer], [employer], [employer].   Your LDL is not in range. Your LDL is the bad cholesterol that can lead to heart attack and stroke. To lower your number you can decrease your fatty foods, red meat, cheese, milk and increase fiber like whole grains and veggies.

## 2013-11-30 LAB — BASIC METABOLIC PANEL WITH GFR
BUN: 13 mg/dL (ref 6–23)
CHLORIDE: 106 meq/L (ref 96–112)
CO2: 25 mEq/L (ref 19–32)
CREATININE: 0.6 mg/dL (ref 0.50–1.10)
Calcium: 9.3 mg/dL (ref 8.4–10.5)
GFR, Est African American: >89 mL/min
GLUCOSE: 99 mg/dL (ref 70–99)
Potassium: 4.7 mEq/L (ref 3.5–5.3)
Sodium: 142 mEq/L (ref 135–145)

## 2013-11-30 LAB — HEPATIC FUNCTION PANEL
ALK PHOS: 50 U/L (ref 39–117)
ALT: 13 U/L (ref 0–35)
AST: 16 U/L (ref 0–37)
Albumin: 4.2 g/dL (ref 3.5–5.2)
Bilirubin, Direct: 0.1 mg/dL (ref 0.0–0.3)
Indirect Bilirubin: 0.4 mg/dL (ref 0.2–1.2)
TOTAL PROTEIN: 6.6 g/dL (ref 6.0–8.3)
Total Bilirubin: 0.5 mg/dL (ref 0.2–1.2)

## 2013-11-30 LAB — LIPID PANEL
Cholesterol: 139 mg/dL (ref 0–200)
HDL: 52 mg/dL (ref >39)
LDL Cholesterol: 78 mg/dL (ref 0–99)
Total CHOL/HDL Ratio: 2.7 Ratio
Triglycerides: 46 mg/dL (ref <150)
VLDL: 9 mg/dL (ref 0–40)

## 2013-11-30 LAB — HEMOGLOBIN A1C
Hgb A1c MFr Bld: 5.9 % — ABNORMAL HIGH (ref <5.7)
Mean Plasma Glucose: 123 mg/dL — ABNORMAL HIGH (ref <117)

## 2013-11-30 LAB — VITAMIN D 25 HYDROXY (VIT D DEFICIENCY, FRACTURES): VIT D 25 HYDROXY: 61 ng/mL (ref 30–89)

## 2013-11-30 LAB — TSH: TSH: 0.893 u[IU]/mL (ref 0.350–4.500)

## 2013-11-30 LAB — MAGNESIUM: MAGNESIUM: 1.9 mg/dL (ref 1.5–2.5)

## 2013-12-19 ENCOUNTER — Other Ambulatory Visit: Payer: Self-pay

## 2013-12-19 MED ORDER — AZITHROMYCIN 250 MG PO TABS: ORAL_TABLET | ORAL | Status: DC

## 2013-12-29 ENCOUNTER — Ambulatory Visit (INDEPENDENT_AMBULATORY_CARE_PROVIDER_SITE_OTHER): Payer: 59

## 2013-12-29 ENCOUNTER — Ambulatory Visit (INDEPENDENT_AMBULATORY_CARE_PROVIDER_SITE_OTHER): Payer: 59 | Admitting: Family Medicine

## 2013-12-29 VITALS — BP 106/60 | HR 71 | Temp 97.9°F | Resp 19 | Ht 64.0 in | Wt 119.8 lb

## 2013-12-29 DIAGNOSIS — R3129 Other microscopic hematuria: Secondary | ICD-10-CM

## 2013-12-29 DIAGNOSIS — R1030 Lower abdominal pain, unspecified: Secondary | ICD-10-CM

## 2013-12-29 DIAGNOSIS — R312 Other microscopic hematuria: Secondary | ICD-10-CM

## 2013-12-29 DIAGNOSIS — K5900 Constipation, unspecified: Secondary | ICD-10-CM

## 2013-12-29 LAB — POCT CBC
GRANULOCYTE PERCENT: 67.4 % (ref 37–80)
HCT, POC: 42.1 % (ref 37.7–47.9)
HEMOGLOBIN: 13.9 g/dL (ref 12.2–16.2)
Lymph, poc: 1.6 (ref 0.6–3.4)
MCH: 31 pg (ref 27–31.2)
MCHC: 32.9 g/dL (ref 31.8–35.4)
MCV: 94.1 fL (ref 80–97)
MID (cbc): 0.6 (ref 0–0.9)
MPV: 7.4 fL (ref 0–99.8)
POC Granulocyte: 4.7 (ref 2–6.9)
POC LYMPH PERCENT: 23.7 %L (ref 10–50)
POC MID %: 8.9 %M (ref 0–12)
Platelet Count, POC: 234 10*3/uL (ref 142–424)
RBC: 4.47 M/uL (ref 4.04–5.48)
RDW, POC: 13.4 %
WBC: 6.9 10*3/uL (ref 4.6–10.2)

## 2013-12-29 LAB — POCT URINALYSIS DIPSTICK
Glucose, UA: NEGATIVE
NITRITE UA: NEGATIVE
PH UA: 5.5
Protein, UA: NEGATIVE
UROBILINOGEN UA: 0.2

## 2013-12-29 LAB — POCT UA - MICROSCOPIC ONLY
CASTS, UR, LPF, POC: NEGATIVE
CRYSTALS, UR, HPF, POC: NEGATIVE
Yeast, UA: NEGATIVE

## 2013-12-29 MED ORDER — POLYETHYLENE GLYCOL 3350 17 GM/SCOOP PO POWD: 17.0000 g | Freq: Two times a day (BID) | ORAL | Status: DC

## 2013-12-29 NOTE — Patient Instructions (Signed)
Your xray showed a lot of stool backed up. Please take the miralax twice daily to help with the constipation. Your blood draw was normal and did not show an infection. Your urine showed a low likelihood of a uti. I sent a culture of it so if that comes back positive I will let you know and send in an antibiotic for you, if it comes back negative you don't need an antibiotic.  Your urine sample was very concentrated, be sure to drink plenty of water.  You had some blood in your urine sample, please be sure to follow up with your urologist as this may just be due to your interstitial cystitis. Your urologist should look into this.

## 2013-12-29 NOTE — Progress Notes (Signed)
Subjective:    Patient ID: Patricia Ryan, female    DOB: July 08, 1955, 58 y.o.   MRN: [id number]  PCP: Alesia Richards, MD  Chief Complaint  Patient presents with  . Abdominal Pain    since Thursday, patient has IBS, lower abdomen,    Patient Active Problem List   Diagnosis Date Noted  . IBS (irritable bowel syndrome)   . Allergy   . Migraines   . Hyperlipidemia   . Fibroids   . Prediabetes   . Interstitial cystitis 07/25/2012  . CONSTIPATION 02/06/2010  . ABDOMINAL BLOATING 02/06/2010  . ANXIETY 09/24/2008  . HX OF GALLSTONE 09/24/2008  . ANAL FISSURE, HX OF 09/24/2008   Prior to Admission medications   Medication Sig Start Date End Date Taking? Authorizing Provider  ALPRAZolam (XANAX) 0.25 MG tablet Take 1 tablet (0.25 mg total) by mouth 3 (three) times daily as needed for anxiety. 11/29/13  Yes Vicie Mutters, PA-C  esomeprazole (NEXIUM) 40 MG capsule Take 1 capsule (40 mg total) by mouth daily. 08/02/13 08/02/14 Yes Vicie Mutters, PA-C  estradiol (ESTRACE) 0.5 MG tablet Take 1 tablet (0.5 mg total) by mouth daily. 02/05/13  Yes Lyman Speller, MD  medroxyPROGESTERone (PROVERA) 5 MG tablet Take 1 tablet (5 mg total) by mouth daily. 02/05/13  Yes Lyman Speller, MD  OXYBUTYNIN CHLORIDE PO Take by mouth daily.   Yes Historical Provider, MD  rosuvastatin (CRESTOR) 20 MG tablet Take 1 tablet (20 mg total) by mouth daily. 01/26/13  Yes Vicie Mutters, PA-C  valACYclovir (VALTREX) 1000 MG tablet Take 1 tablet (1,000 mg total) by mouth daily. 06/20/13  Yes Vicie Mutters, PA-C  vitamin E 400 UNIT capsule Take 200 Units by mouth daily.    Yes Historical Provider, MD   Medications, allergies, past medical history, surgical history, family history, social history and problem list reviewed and updated.  HPI  58 yof with PMH interstitial cystitis and ibs presents with 2 day h/o lower abd pain.  One week ago she had uri sx and was tx with amox then azithro. Had vaginal yeast  infx after this resolved with diflucan. Felt better for couple days then 2 days ago started having lower abd pain. Has been fairly constant with no radiation. Described as pain. Initially 8/10 but has slowly decreased currently 5/10. Abd pain came on after eating regular dinner. She has had normal BM several times yest and normal BM today. She has been eating ok yest and today.   Started oxybutynin one year ago by her urologist for interstitial cystitis. Feels she has been very mildly constipated since then. Typically has BM every 2 or 3 days.   Nausea 2 days ago but none since. No vomiting. No dysuria, freq, or urgency. Typically gets approx 2 utis per year. Her normal sx is urgency.   Review of Systems No CP, SOB. No hematochezia, melena. No hematuria. No vaginal discharge. No odor. No vaginal itchiness.     Objective:   Physical Exam  Constitutional: She is oriented to person, place, and time. She appears well-developed and well-nourished.  Non-toxic appearance. She does not have a sickly appearance. She does not appear ill. No distress.  BP 106/60 mmHg  Pulse 71  Temp(Src) 97.9 F (36.6 C) (Oral)  Resp 19  Ht 5\' 4"  (1.626 m)  Wt 119 lb 12.8 oz (54.341 kg)  BMI 20.55 kg/m2  SpO2 99%  LMP 01/20/2011   Cardiovascular: Normal rate, regular rhythm, S1 normal and S2 normal.  Exam reveals no gallop.   No murmur heard. Pulmonary/Chest: Effort normal and breath sounds normal. She has no decreased breath sounds. She has no wheezes. She has no rhonchi. She has no rales.  Abdominal: Soft. Normal appearance and bowel sounds are normal. She exhibits no mass. There is no hepatosplenomegaly. There is tenderness in the periumbilical area and suprapubic area. There is guarding. There is no rebound and no CVA tenderness.    TTP suprapubic and just right and inferior of umbilicus. Voluntary guarding over areas.   Neurological: She is alert and oriented to person, place, and time.  Psychiatric: She has  a normal mood and affect. Her speech is normal.    Results for orders placed or performed in visit on 12/29/13  POCT CBC  Result Value Ref Range   WBC 6.9 4.6 - 10.2 K/uL   Lymph, poc 1.6 0.6 - 3.4   POC LYMPH PERCENT 23.7 10 - 50 %L   MID (cbc) 0.6 0 - 0.9   POC MID % 8.9 0 - 12 %M   POC Granulocyte 4.7 2 - 6.9   Granulocyte percent 67.4 37 - 80 %G   RBC 4.47 4.04 - 5.48 M/uL   Hemoglobin 13.9 12.2 - 16.2 g/dL   HCT, POC 42.1 37.7 - 47.9 %   MCV 94.1 80 - 97 fL   MCH, POC 31.0 27 - 31.2 pg   MCHC 32.9 31.8 - 35.4 g/dL   RDW, POC 13.4 %   Platelet Count, POC 234 142 - 424 K/uL   MPV 7.4 0 - 99.8 fL  POCT UA - Microscopic Only  Result Value Ref Range   WBC, Ur, HPF, POC 4-10    RBC, urine, microscopic 2-5    Bacteria, U Microscopic trace    Mucus, UA trace    Epithelial cells, urine per micros 6-12    Crystals, Ur, HPF, POC neg    Casts, Ur, LPF, POC neg    Yeast, UA neg   POCT urinalysis dipstick  Result Value Ref Range   Color, UA yellow    Clarity, UA cleawr    Glucose, UA neg    Bilirubin, UA small    Ketones, UA trace    Spec Grav, UA >=1.030    Blood, UA mod    pH, UA 5.5    Protein, UA neg    Urobilinogen, UA 0.2    Nitrite, UA neg    Leukocytes, UA Trace     [facility] reading (PRIMARY) by  Dr. Brigitte Pulse. Findings: Moderate stool burden.      Assessment & Plan:   58 yof with PMH interstitial cystitis and ibs presents with 2 day h/o lower abd pain.  Lower abdominal pain - Plan: POCT CBC, POCT UA - Microscopic Only, POCT urinalysis dipstick, DG Abd 2 Views, Urine culture Constipation, unspecified constipation type - Plan: polyethylene glycol powder (GLYCOLAX/MIRALAX) powder --no infx with normal wbc ct --Constipation with moderate stool burden likely cause of sx --clean out with miralax --Pt encouraged to speak with urologist about alternatives to oxybutynin as she has been somewhat constipated since starting this  Hematuria, microscopic --will send cx for  urine. Small leuks and mod blood on ua without urinary sx. Will tx for uti if cx comes back positive --encouraged to f/u with her urologist as only trace blood on ua 2 yrs ago, pt is postmenopausal --rtc if fever, chills, or urinary sx  Julieta Gutting, PA-C Physician Assistant-Certified [facility]  12/29/2013 11:29 AM

## 2013-12-30 LAB — URINE CULTURE: Colony Count: 40000

## 2014-01-28 ENCOUNTER — Encounter: Payer: Self-pay | Admitting: Physician Assistant

## 2014-01-28 ENCOUNTER — Ambulatory Visit (INDEPENDENT_AMBULATORY_CARE_PROVIDER_SITE_OTHER): Payer: 59 | Admitting: Physician Assistant

## 2014-01-28 VITALS — BP 108/64 | HR 68 | Temp 98.2°F | Resp 18 | Ht 63.5 in | Wt 125.0 lb

## 2014-01-28 DIAGNOSIS — N3010 Interstitial cystitis (chronic) without hematuria: Secondary | ICD-10-CM

## 2014-01-28 DIAGNOSIS — R7309 Other abnormal glucose: Secondary | ICD-10-CM

## 2014-01-28 DIAGNOSIS — E785 Hyperlipidemia, unspecified: Secondary | ICD-10-CM

## 2014-01-28 DIAGNOSIS — E559 Vitamin D deficiency, unspecified: Secondary | ICD-10-CM

## 2014-01-28 DIAGNOSIS — T7840XS Allergy, unspecified, sequela: Secondary | ICD-10-CM

## 2014-01-28 DIAGNOSIS — R7303 Prediabetes: Secondary | ICD-10-CM

## 2014-01-28 DIAGNOSIS — Z23 Encounter for immunization: Secondary | ICD-10-CM

## 2014-01-28 DIAGNOSIS — K589 Irritable bowel syndrome without diarrhea: Secondary | ICD-10-CM

## 2014-01-28 DIAGNOSIS — F411 Generalized anxiety disorder: Secondary | ICD-10-CM

## 2014-01-28 DIAGNOSIS — K5900 Constipation, unspecified: Secondary | ICD-10-CM

## 2014-01-28 DIAGNOSIS — B009 Herpesviral infection, unspecified: Secondary | ICD-10-CM

## 2014-01-28 DIAGNOSIS — G43009 Migraine without aura, not intractable, without status migrainosus: Secondary | ICD-10-CM

## 2014-01-28 DIAGNOSIS — Z79899 Other long term (current) drug therapy: Secondary | ICD-10-CM

## 2014-01-28 LAB — CBC WITH DIFFERENTIAL/PLATELET
BASOS ABS: 0 10*3/uL (ref 0.0–0.1)
BASOS PCT: 1 % (ref 0–1)
Eosinophils Absolute: 0.1 10*3/uL (ref 0.0–0.7)
Eosinophils Relative: 3 % (ref 0–5)
HCT: 41.3 % (ref 36.0–46.0)
HEMOGLOBIN: 14.4 g/dL (ref 12.0–15.0)
LYMPHS PCT: 25 % (ref 12–46)
Lymphs Abs: 1.1 10*3/uL (ref 0.7–4.0)
MCH: 30.8 pg (ref 26.0–34.0)
MCHC: 34.9 g/dL (ref 30.0–36.0)
MCV: 88.2 fL (ref 78.0–100.0)
MONO ABS: 0.5 10*3/uL (ref 0.1–1.0)
MPV: 9.5 fL (ref 8.6–12.4)
Monocytes Relative: 12 % (ref 3–12)
NEUTROS ABS: 2.7 10*3/uL (ref 1.7–7.7)
NEUTROS PCT: 59 % (ref 43–77)
Platelets: 230 10*3/uL (ref 150–400)
RBC: 4.68 MIL/uL (ref 3.87–5.11)
RDW: 13.1 % (ref 11.5–15.5)
WBC: 4.5 10*3/uL (ref 4.0–10.5)

## 2014-01-28 LAB — HEMOGLOBIN A1C
Hgb A1c MFr Bld: 5.3 % (ref <5.7)
Mean Plasma Glucose: 105 mg/dL (ref <117)

## 2014-01-28 NOTE — Progress Notes (Signed)
Complete Physical  Assessment and Plan: 1. Prediabetes Your A1C is in the diabetic range. Your A1C is a measure of your sugar over the past 3 months and is not affected by what you have eaten over the past few days. Diabetes increases your chances of stroke and heart attack over 300 % and is the leading cause of blindness and kidney failure in the [place]. Please make sure you decrease bad carbs like white bread, white rice, potatoes, corn, soft drinks, pasta, cereals, refined sugars, sweet tea, dried fruits, and fruit juice. Good carbs are okay to eat in moderation like sweet potatoes, brown rice, whole grain pasta/bread, most fruit (except dried fruit) and you can eat as many veggies as you want.  - CBC with Differential - BASIC METABOLIC PANEL WITH GFR - Hepatic function panel - TSH - Hemoglobin A1c - Insulin, fasting - Urinalysis, Routine w reflex microscopic - Microalbumin / creatinine urine ratio  2. Hyperlipidemia -continue medications, check lipids, decrease fatty foods, increase activity.  - Lipid panel - EKG 12-Lead  3. Constipation, unspecified constipation type Can try linzess samples, start going back to gym, increase fluids  4. Migraine without aura and without status migrainosus, not intractable remission  5. IBS (irritable bowel syndrome) With contstipatoin will try samples on linzess.   6. Interstitial cystitis Continue meds  7. Anxiety state remission  8. Allergy, sequela Continue OTC allergy pills  9. HSV-2 infection Take meds PRN  10. Vitamin D deficiency - Vit D  25 hydroxy (rtn osteoporosis monitoring)  11. Medication management - Magnesium  12. Need for Tdap vaccination TDAP   Discussed med's effects and SE's. Screening labs and tests as requested with regular follow-up as recommended.  HPI  59 y.o. female  presents for a complete physical.   Her blood pressure has been controlled at home, today their BP is BP: 108/64 mmHg She does  workout, 3-5 days a week.  She denies chest pain, shortness of breath, dizziness.  She is on cholesterol medication, crestor 10mg  3 times a week and denies myalgias. Her cholesterol is at goal. The cholesterol last visit was:   Lab Results  Component Value Date   CHOL 139 11/29/2013   HDL 52 11/29/2013   LDLCALC 78 11/29/2013   TRIG 46 11/29/2013   CHOLHDL 2.7 11/29/2013   She has been working on diet and exercise for prediabetes, and denies paresthesia of the feet, polydipsia, polyuria and visual disturbances. Last A1C in the office was:  Lab Results  Component Value Date   HGBA1C 5.9* 11/29/2013   Patient is on Vitamin D supplement.   Lab Results  Component Value Date   VD25OH 56 11/29/2013   She lost her daughter 2.5 years ago and very rarely takes xanax for anxiety.    She is post menopausal on estrace and provera, added low dose bASA every other day.  She has OAB/ICS and is on oxybutynin from Dr. Amalia Hailey which has causes some chronic constipation. Patient when to urgent care 12/29/2013 for AB pain, thought to be constipation, she took miralax for 4 days and is now better.  She is on nexium OTC once daily. .   Current Medications:  Current Outpatient Prescriptions on File Prior to Visit  Medication Sig Dispense Refill  . ALPRAZolam (XANAX) 0.25 MG tablet Take 1 tablet (0.25 mg total) by mouth 3 (three) times daily as needed for anxiety. 90 tablet 0  . esomeprazole (NEXIUM) 40 MG capsule Take 1 capsule (40 mg total)  by mouth daily. 30 capsule 0  . estradiol (ESTRACE) 0.5 MG tablet Take 1 tablet (0.5 mg total) by mouth daily. 30 tablet 13  . medroxyPROGESTERone (PROVERA) 5 MG tablet Take 1 tablet (5 mg total) by mouth daily. 30 tablet 13  . OXYBUTYNIN CHLORIDE PO Take by mouth daily.    . polyethylene glycol powder (GLYCOLAX/MIRALAX) powder Take 17 g by mouth 2 (two) times daily. (Patient taking differently: Take 17 g by mouth as needed. ) 225 g 1  . rosuvastatin (CRESTOR) 20 MG  tablet Take 1 tablet (20 mg total) by mouth daily. 90 tablet 0  . valACYclovir (VALTREX) 1000 MG tablet Take 1 tablet (1,000 mg total) by mouth daily. 90 tablet 3  . vitamin E 400 UNIT capsule Take 200 Units by mouth daily.      No current facility-administered medications on file prior to visit.   Health Maintenance:   Immunization History  Administered Date(s) Administered  . DTaP 01/26/2004   Tetanus: 2006 DUE Pneumovax: N/A Flu vaccine: 09/2013 at [facility] Zostavax: N/A Pap: 2013 negative, gets with Dr. Sabra Heck MGM: 10/09/13 negative DEXA: N/A Colonoscopy: 07/12/2012 Dr. [name] EGD: 07/12/2012 Gastritis, neg Hpylori Last Dental Exam:Dr. Ennis Forts Last Eye Exam: Dr Katy Fitch  Patient Care Team: Unk Pinto, MD as PCP - General (Internal Medicine) Lafayette Dragon, MD as Consulting Physician (Gastroenterology)  Allergies:  Allergies  Allergen Reactions  . Sulfa Antibiotics Hives, Shortness Of Breath and Rash  . Macrobid [Nitrofurantoin Macrocrystal] Nausea Only   Medical History:  Past Medical History  Diagnosis Date  . GERD (gastroesophageal reflux disease)   . IBS (irritable bowel syndrome)   . Migraines   . HSV-2 (herpes simplex virus 2) infection   . Anal fissure   . Anxiety   . Interstitial cystitis   . Nevus     vulva nevus  . Allergy   . Prediabetes   . Hyperlipidemia   . Fibroids     Uterine   Surgical History:  Past Surgical History  Procedure Laterality Date  . Cholecystectomy    . Tubal reconstruction    . Bladder repair    . Tonsillectomy    . Vaginal delivery      x2  . 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 Tubalplasty  1985/ 1990   . Bunionectomy     Family History:  Family History  Problem Relation Age of Onset  . Celiac disease Mother   . Heart disease Father   . Kidney disease      tumor  . Irritable bowel syndrome Sister   . Thyroid disease Sister   . Pancreatic cancer Maternal Grandmother   . Lung cancer Maternal Grandmother    . Diabetes Paternal Grandmother   . Breast cancer Paternal Grandmother   . Colon cancer Neg Hx    Social History:  History  Substance Use Topics  . Smoking status: Former Smoker    Types: Cigarettes    Quit date: 12/26/2011  . Smokeless tobacco: Never Used  . Alcohol Use: 1.0 oz/week    2 drink(s) per week     Comment: weekends mostly   Review of Systems: Review of Systems  Constitutional: Negative.   HENT: Negative.   Eyes: Negative.   Respiratory: Negative.   Cardiovascular: Negative.   Gastrointestinal: Positive for heartburn and constipation. Negative for nausea, vomiting, abdominal pain, diarrhea, blood in stool and melena.  Genitourinary: Negative.        ICS  Musculoskeletal: Positive for myalgias. Negative for back pain, joint pain, falls and neck pain.  Skin: Negative.   Neurological: Positive for tingling (bilateral hands, worse at night. ). Negative for dizziness, tremors, sensory change, speech change, focal weakness, seizures and loss of consciousness.  Psychiatric/Behavioral: Negative.     Physical Exam: Estimated body mass index is 21.79 kg/(m^2) as calculated from the following:   Height as of this encounter: 5' 3.5" (1.613 m).   Weight as of this encounter: 125 lb (56.7 kg). BP 108/64 mmHg  Pulse 68  Temp(Src) 98.2 F (36.8 C) (Temporal)  Resp 18  Ht 5' 3.5" (1.613 m)  Wt 125 lb (56.7 kg)  BMI 21.79 kg/m2  SpO2 98%  LMP 01/20/2011 General Appearance: Well nourished, in no apparent distress.  Eyes: PERRLA, EOMs, conjunctiva no swelling or erythema, normal fundi and vessels.  Sinuses: No Frontal/maxillary tenderness  ENT/Mouth: Ext aud canals clear, normal light reflex with TMs without erythema, bulging. Good dentition. No erythema, swelling, or exudate on post pharynx. Tonsils not swollen or erythematous. Hearing normal.  Neck: Supple, thyroid normal. No bruits  Respiratory: Respiratory effort normal, BS equal bilaterally without rales, rhonchi,  wheezing or stridor.  Cardio: RRR without murmurs, rubs or gallops. Brisk peripheral pulses without edema.  Chest: symmetric, with normal excursions and percussion.  Breasts: defer Abdomen: Soft, nontender, no guarding, rebound, hernias, masses, or organomegaly. .  Lymphatics: Non tender without lymphadenopathy.  Genitourinary: defer Musculoskeletal: Full ROM all peripheral extremities,5/5 strength, and normal gait.  Skin: Warm, dry without rashes, lesions, ecchymosis. Neuro: Cranial nerves intact, reflexes equal bilaterally. Normal muscle tone, no cerebellar symptoms. Sensation intact.  Psych: Awake and oriented X 3, normal affect, Insight and Judgment appropriate.   EKG: WNL no changes. AORTA SCAN: defer   Vicie Mutters 9:19 AM [facility]

## 2014-01-28 NOTE — Patient Instructions (Signed)

## 2014-01-29 LAB — HEPATIC FUNCTION PANEL
ALK PHOS: 53 U/L (ref 39–117)
ALT: 13 U/L (ref 0–35)
AST: 17 U/L (ref 0–37)
Albumin: 4.3 g/dL (ref 3.5–5.2)
Bilirubin, Direct: 0.1 mg/dL (ref 0.0–0.3)
Indirect Bilirubin: 0.3 mg/dL (ref 0.2–1.2)
TOTAL PROTEIN: 6.8 g/dL (ref 6.0–8.3)
Total Bilirubin: 0.4 mg/dL (ref 0.2–1.2)

## 2014-01-29 LAB — VITAMIN D 25 HYDROXY (VIT D DEFICIENCY, FRACTURES): VIT D 25 HYDROXY: 43 ng/mL (ref 30–100)

## 2014-01-29 LAB — MICROALBUMIN / CREATININE URINE RATIO
Creatinine, Urine: 57 mg/dL
MICROALB/CREAT RATIO: 7 mg/g (ref 0.0–30.0)
Microalb, Ur: 0.4 mg/dL (ref <2.0)

## 2014-01-29 LAB — URINALYSIS, ROUTINE W REFLEX MICROSCOPIC
BILIRUBIN URINE: NEGATIVE
GLUCOSE, UA: NEGATIVE mg/dL
Hgb urine dipstick: NEGATIVE
KETONES UR: NEGATIVE mg/dL
Leukocytes, UA: NEGATIVE
Nitrite: NEGATIVE
PH: 5 (ref 5.0–8.0)
Protein, ur: NEGATIVE mg/dL
Specific Gravity, Urine: <1.005 — ABNORMAL LOW (ref 1.005–1.030)
Urobilinogen, UA: 0.2 mg/dL (ref 0.0–1.0)

## 2014-01-29 LAB — BASIC METABOLIC PANEL WITH GFR
BUN: 10 mg/dL (ref 6–23)
CALCIUM: 9.4 mg/dL (ref 8.4–10.5)
CO2: 25 mEq/L (ref 19–32)
Chloride: 106 mEq/L (ref 96–112)
Creat: 0.6 mg/dL (ref 0.50–1.10)
GFR, Est Non African American: >89 mL/min
GLUCOSE: 102 mg/dL — AB (ref 70–99)
Potassium: 4.6 mEq/L (ref 3.5–5.3)
Sodium: 141 mEq/L (ref 135–145)

## 2014-01-29 LAB — INSULIN, FASTING: Insulin fasting, serum: 5.9 u[IU]/mL (ref 2.0–19.6)

## 2014-01-29 LAB — LIPID PANEL
Cholesterol: 181 mg/dL (ref 0–200)
HDL: 54 mg/dL (ref >39)
LDL CALC: 109 mg/dL — AB (ref 0–99)
Total CHOL/HDL Ratio: 3.4 Ratio
Triglycerides: 88 mg/dL (ref <150)
VLDL: 18 mg/dL (ref 0–40)

## 2014-01-29 LAB — TSH: TSH: 1.392 u[IU]/mL (ref 0.350–4.500)

## 2014-01-29 LAB — MAGNESIUM: Magnesium: 2.1 mg/dL (ref 1.5–2.5)

## 2014-02-05 NOTE — Progress Notes (Signed)
Reviewed documentation and xray and agree w/ assessment and plan. [facility], MD MPH   

## 2014-02-09 ENCOUNTER — Other Ambulatory Visit: Payer: Self-pay | Admitting: Obstetrics & Gynecology

## 2014-02-11 NOTE — Telephone Encounter (Signed)
Medication refill request: Estradiol 0.5mg  daily Last AEX:  02/05/13 Next AEX: 02/21/14 Last MMG (if hormonal medication request): 10/09/13-normal Refill authorized:

## 2014-02-21 ENCOUNTER — Encounter: Payer: Self-pay | Admitting: Obstetrics & Gynecology

## 2014-02-21 ENCOUNTER — Ambulatory Visit (INDEPENDENT_AMBULATORY_CARE_PROVIDER_SITE_OTHER): Payer: 59 | Admitting: Obstetrics & Gynecology

## 2014-02-21 VITALS — BP 106/66 | HR 70 | Resp 14 | Ht 63.25 in | Wt 124.0 lb

## 2014-02-21 DIAGNOSIS — Z01419 Encounter for gynecological examination (general) (routine) without abnormal findings: Secondary | ICD-10-CM

## 2014-02-21 DIAGNOSIS — R1031 Right lower quadrant pain: Secondary | ICD-10-CM

## 2014-02-21 DIAGNOSIS — Z0000 Encounter for general adult medical examination without abnormal findings: Secondary | ICD-10-CM

## 2014-02-21 LAB — POCT URINALYSIS DIPSTICK
BILIRUBIN UA: NEGATIVE
GLUCOSE UA: NEGATIVE
Ketones, UA: NEGATIVE
Leukocytes, UA: NEGATIVE
Nitrite, UA: NEGATIVE
PH UA: 5
PROTEIN UA: NEGATIVE
Urobilinogen, UA: NEGATIVE

## 2014-02-21 MED ORDER — MEDROXYPROGESTERONE ACETATE 2.5 MG PO TABS: 5.0000 mg | ORAL_TABLET | Freq: Every day | ORAL | Status: DC

## 2014-02-21 MED ORDER — ESTRADIOL 0.5 MG PO TABS: 0.5000 mg | ORAL_TABLET | Freq: Every day | ORAL | Status: DC

## 2014-02-21 NOTE — Progress Notes (Signed)
Scheduled patient for PUS appointment while in the office today. Patient is scheduled for 02/28/2014 at 4pm with Dr.Miller. Agreeable to date and time.

## 2014-02-21 NOTE — Progress Notes (Addendum)
Patient ID: Patricia Ryan, female   DOB: 1955/08/28, 59 y.o.   MRN: [id number]   59 y.o. G2P2 MarriedCaucasianF here for annual exam.  Doing well.  No vaginal bleeding.  Seeing derm every year, now.  Pt now has diagnosis of "pre-diabetes".  Being followed closely.  Pt reports increased RLQ pain.  She has had this intermittently in the past.  This does make her anxious at time about her ovaries.    PCP:  Unk Pinto, MD  Patient's last menstrual period was 01/20/2011.          Sexually active: Yes.    The current method of family planning is post menopausal status.    Exercising: Yes.    Gym work out 3x/week. Smoker:  Former smoker--quit 2 1/2 years ago.  Health Maintenance: Pap:  02-05-13 WNL, neg HR HPV 2012.   History of abnormal Pap:  no MMG:  10-09-13 fibroglandular density/nl:[facility]oscopy: 06/2012 benign polyp with Dr. Delfin Edis.  F/U 10 years.  BMD:   never TDaP:  01/2014 Screening Labs: PCP, Hb today: PCP, Urine today: Trace RBC's   reports that she quit smoking about 2 years ago. Her smoking use included Cigarettes. She has never used smokeless tobacco. She reports that she drinks about 1.2 oz of alcohol per week. She reports that she does not use illicit drugs.  Past Medical History  Diagnosis Date  . GERD (gastroesophageal reflux disease)   . IBS (irritable bowel syndrome)   . Migraines   . HSV-2 (herpes simplex virus 2) infection   . Anal fissure   . Anxiety   . Interstitial cystitis   . Nevus     vulva nevus  . Allergy   . Prediabetes   . Hyperlipidemia   . Fibroids     Uterine    Past Surgical History  Procedure Laterality Date  . Cholecystectomy    . Tubal reconstruction    . Bladder repair    . Tonsillectomy    . Vaginal delivery      x2  . 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 Tubalplasty  1985/ 1990   . Bunionectomy      Current Outpatient Prescriptions  Medication Sig Dispense Refill  . ALPRAZolam (XANAX) 0.25 MG tablet Take  1 tablet (0.25 mg total) by mouth 3 (three) times daily as needed for anxiety. 90 tablet 0  . esomeprazole (NEXIUM) 40 MG capsule Take 1 capsule (40 mg total) by mouth daily. 30 capsule 0  . estradiol (ESTRACE) 0.5 MG tablet TAKE 1 TABLET BY MOUTH ONCE DAILY 30 tablet 0  . medroxyPROGESTERone (PROVERA) 5 MG tablet Take 1 tablet (5 mg total) by mouth daily. 30 tablet 13  . OXYBUTYNIN CHLORIDE PO Take by mouth daily.    . polyethylene glycol powder (GLYCOLAX/MIRALAX) powder Take 17 g by mouth 2 (two) times daily. (Patient taking differently: Take 17 g by mouth as needed. ) 225 g 1  . rosuvastatin (CRESTOR) 20 MG tablet Take 1 tablet (20 mg total) by mouth daily. 90 tablet 0  . valACYclovir (VALTREX) 1000 MG tablet Take 1 tablet (1,000 mg total) by mouth daily. 90 tablet 3  . vitamin E 400 UNIT capsule Take 200 Units by mouth daily.      No current facility-administered medications for this visit.    Family History  Problem Relation Age of Onset  . Celiac disease Mother   . Heart disease Father   . Kidney disease  tumor  . Irritable bowel syndrome Sister   . Thyroid disease Sister   . Pancreatic cancer Maternal Grandmother   . Lung cancer Maternal Grandmother   . Diabetes Paternal Grandmother   . Breast cancer Paternal Grandmother   . Colon cancer Neg Hx     ROS:  Pertinent items are noted in HPI.  Otherwise, a comprehensive ROS was negative.  Exam:   BP 106/66 mmHg  Pulse 70  Resp 14  Ht 5' 3.25" (1.607 m)  Wt 124 lb (56.246 kg)  BMI 21.78 kg/m2  LMP 01/20/2011   Height: 5' 3.25" (160.7 cm)  Ht Readings from Last 3 Encounters:  02/21/14 5' 3.25" (1.607 m)  01/28/14 5' 3.5" (1.613 m)  12/29/13 5\' 4"  (1.626 m)    General appearance: alert, cooperative and appears stated age Head: Normocephalic, without obvious abnormality, atraumatic Neck: no adenopathy, supple, symmetrical, trachea midline and thyroid normal to inspection and palpation Lungs: clear to auscultation  bilaterally Breasts: normal appearance, no masses or tenderness Heart: regular rate and rhythm Abdomen: soft, non-tender; bowel sounds normal; no masses,  no organomegaly Extremities: extremities normal, atraumatic, no cyanosis or edema Skin: Skin color, texture, turgor normal. No rashes or lesions Lymph nodes: Cervical, supraclavicular, and axillary nodes normal. No abnormal inguinal nodes palpated Neurologic: Grossly normal   Pelvic: External genitalia:  no lesions              Urethra:  normal appearing urethra with no masses, tenderness or lesions              Bartholins and Skenes: normal                 Vagina: normal appearing vagina with normal color and discharge, no lesions              Cervix: no lesions              Pap taken: No. Bimanual Exam:  Uterus:  normal size, contour, position, consistency, mobility, non-tender              Adnexa: normal adnexa and no mass, fullness, tenderness               Rectovaginal: Confirms               Anus:  normal sphincter tone, no lesions  Chaperone was present for exam.  A:  Well Woman with normal exam PMP, on HRT. Pt tried to stop this last year.  Had hot flashes.  She will cut both in 1/2 and try to do this more slowly this year. 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 Incontinence at end of voiding. On Detrol. Has f/u with Dr. Amalia Hailey. RLQ pain  P: Mammogram yearly.  Pap last year.  HR HPV 2012. Rx for Estradiol 0.5mg  and Provera 2.5mg  daily. Rx to pharmacy.  Pt will try to cut in 1/2 and see how she is from symptom standpoint. On valtrex.  No rx needed.   Return for PUS to assess ovaries.  H/O constipation but will do PUS just to be sure. return annually or prn

## 2014-02-28 ENCOUNTER — Ambulatory Visit (INDEPENDENT_AMBULATORY_CARE_PROVIDER_SITE_OTHER): Payer: 59 | Admitting: Obstetrics & Gynecology

## 2014-02-28 ENCOUNTER — Ambulatory Visit (INDEPENDENT_AMBULATORY_CARE_PROVIDER_SITE_OTHER): Payer: 59

## 2014-02-28 DIAGNOSIS — R1031 Right lower quadrant pain: Secondary | ICD-10-CM

## 2014-02-28 DIAGNOSIS — D251 Intramural leiomyoma of uterus: Secondary | ICD-10-CM

## 2014-02-28 DIAGNOSIS — K589 Irritable bowel syndrome without diarrhea: Secondary | ICD-10-CM

## 2014-02-28 NOTE — Progress Notes (Signed)
59 y.o.Marriedfemale here for a pelvic ultrasound.    Patient's last menstrual period was 01/20/2011.  Sexually active:  yes  Contraception: PMP  FINDINGS: UTERUS: 6.5 x 4.0 x 3.0cm with 71mm intramural fibroid EMS: 2.11mm ADNEXA:   Left ovary 1.6 x 1.4 x 0.9cm   Right ovary 1.5 x 1.3 x 0.7cm CUL DE SAC: no free fluid  Images reviewed.  Ovaries appear completley normal without evidence of mass or other findings.  Pt is reassured.  She is aware pain can also be GI related.  Feels like she eats well but will start watching fiber intake to see if this will help.  Does have a hx of constipation.  Reports many of her issues are GI related.  Has had a colonoscopy 6/14 with Dr. [name].  Follow up due 10 years.  Pt comfortable with plan.  All questions answered.  Assessment:  RLQ pain, h/o constipation, h/o IBS  Plan: Pt will start working on increased fiber in diet and appropriate stool softeners discussed.  Pt will return for AEX 1 year or f/u PRN.  ~15 minutes spent with patient >50% of time was in face to face discussion of above.

## 2014-03-08 ENCOUNTER — Encounter: Payer: Self-pay | Admitting: Obstetrics & Gynecology

## 2014-03-08 NOTE — Addendum Note (Signed)
Addended by: Megan Salon on: 03/08/2014 06:52 AM   Modules accepted: Miquel Dunn

## 2014-03-10 ENCOUNTER — Other Ambulatory Visit: Payer: Self-pay | Admitting: Obstetrics & Gynecology

## 2014-03-11 NOTE — Telephone Encounter (Signed)
Medication refill request: Provera 5 mg tab Last AEX:  02/21/14 SM Next AEX: 04/24/15  Last MMG (if hormonal medication request): 10/09/13 BIRADS1:Neg Refill authorized: 02/21/14 Provera 2.5 mg #90/4R.   Pharmacy requesting Provera 5 mg once a day instead of 2.5 mg BID.  Please advise.

## 2014-05-11 ENCOUNTER — Other Ambulatory Visit: Payer: Self-pay | Admitting: Physician Assistant

## 2014-06-25 ENCOUNTER — Other Ambulatory Visit: Payer: Self-pay | Admitting: Physician Assistant

## 2014-07-18 ENCOUNTER — Encounter: Payer: Self-pay | Admitting: Nurse Practitioner

## 2014-07-18 ENCOUNTER — Ambulatory Visit (INDEPENDENT_AMBULATORY_CARE_PROVIDER_SITE_OTHER): Payer: 59 | Admitting: Nurse Practitioner

## 2014-07-18 VITALS — BP 110/64 | HR 68 | Resp 16 | Ht 63.25 in | Wt 125.0 lb

## 2014-07-18 DIAGNOSIS — N760 Acute vaginitis: Secondary | ICD-10-CM

## 2014-07-18 NOTE — Progress Notes (Signed)
59 y.o.Married white female G2P2 here with complaint of vaginal symptoms of itching, burning, and increase discharge. Describes discharge as thin and white. Onset of symptoms 7 days ago. Used 1 day Monistat on Sunday with a lot of local burning and raw feeling.  After a day the symptoms seemed to be a little better but not well.    Last  SA on Saturday with some discofort and stopped when it became uncomfortable.    No increase urinary symptoms - she does have IC but no flare.    Denies new personal products.  On HRT for 3 years,  and using OTC lubrication if needed.    No STD concern.  Contraception is postmenopausal.   O:Healthy female WDWN Affect: normal, orientation x 3  Exam: no acute distress Abdomen: soft and non tender Lymph node: no enlargement or tenderness Pelvic exam: External genital: normal female BUS: negative Vagina: thin white vaginal discharge noted.  Affirm taken Cervix: normal, non tender, no CMT Uterus: normal, non tender Adnexa:normal, non tender, no masses or fullness noted  Affirm is sent to the lab   A: Normal pelvic exam  Vaginitis  Atrophic vaginitis   P: Discussed findings of vaginitis and etiology. Discussed Aveeno or baking soda sitz bath for comfort. Avoid moist clothes or pads for extended period of time. If working out in gym clothes or swim suits for long periods of time change underwear or bottoms of swimsuit if possible. Olive Oil/Coconut Oil use for skin protection prior to activity can be used to external skin.  Rx: will await Affirm test results  Rv prn

## 2014-07-18 NOTE — Patient Instructions (Signed)
We will call you with test results and treatment in the morning.

## 2014-07-19 ENCOUNTER — Other Ambulatory Visit: Payer: Self-pay | Admitting: Nurse Practitioner

## 2014-07-19 LAB — WET PREP BY MOLECULAR PROBE
CANDIDA SPECIES: NEGATIVE
Gardnerella vaginalis: POSITIVE — AB
Trichomonas vaginosis: NEGATIVE

## 2014-07-19 MED ORDER — METRONIDAZOLE 0.75 % VA GEL: 1.0000 | Freq: Every day | VAGINAL | Status: DC

## 2014-07-20 NOTE — Progress Notes (Signed)
Encounter reviewed by Dr. Brook Amundson C. Silva.  

## 2014-08-06 ENCOUNTER — Ambulatory Visit (INDEPENDENT_AMBULATORY_CARE_PROVIDER_SITE_OTHER): Payer: 59 | Admitting: Physician Assistant

## 2014-08-06 ENCOUNTER — Encounter: Payer: Self-pay | Admitting: Physician Assistant

## 2014-08-06 VITALS — BP 118/68 | HR 56 | Temp 97.7°F | Resp 16 | Ht 63.5 in | Wt 124.0 lb

## 2014-08-06 DIAGNOSIS — N6320 Unspecified lump in the left breast, unspecified quadrant: Secondary | ICD-10-CM

## 2014-08-06 DIAGNOSIS — E559 Vitamin D deficiency, unspecified: Secondary | ICD-10-CM

## 2014-08-06 DIAGNOSIS — Z79899 Other long term (current) drug therapy: Secondary | ICD-10-CM

## 2014-08-06 DIAGNOSIS — K2100 Gastro-esophageal reflux disease with esophagitis, without bleeding: Secondary | ICD-10-CM

## 2014-08-06 DIAGNOSIS — R319 Hematuria, unspecified: Secondary | ICD-10-CM

## 2014-08-06 DIAGNOSIS — R7303 Prediabetes: Secondary | ICD-10-CM

## 2014-08-06 DIAGNOSIS — Z79890 Hormone replacement therapy (postmenopausal): Secondary | ICD-10-CM

## 2014-08-06 DIAGNOSIS — E785 Hyperlipidemia, unspecified: Secondary | ICD-10-CM

## 2014-08-06 DIAGNOSIS — R7309 Other abnormal glucose: Secondary | ICD-10-CM

## 2014-08-06 LAB — CBC WITH DIFFERENTIAL/PLATELET
Basophils Absolute: 0 10*3/uL (ref 0.0–0.1)
Basophils Relative: 0 % (ref 0–1)
Eosinophils Absolute: 0.2 10*3/uL (ref 0.0–0.7)
Eosinophils Relative: 3 % (ref 0–5)
HCT: 41.4 % (ref 36.0–46.0)
HEMOGLOBIN: 13.7 g/dL (ref 12.0–15.0)
LYMPHS PCT: 23 % (ref 12–46)
Lymphs Abs: 1.2 10*3/uL (ref 0.7–4.0)
MCH: 30.3 pg (ref 26.0–34.0)
MCHC: 33.1 g/dL (ref 30.0–36.0)
MCV: 91.6 fL (ref 78.0–100.0)
MPV: 10.1 fL (ref 8.6–12.4)
Monocytes Absolute: 0.5 10*3/uL (ref 0.1–1.0)
Monocytes Relative: 10 % (ref 3–12)
NEUTROS ABS: 3.3 10*3/uL (ref 1.7–7.7)
NEUTROS PCT: 64 % (ref 43–77)
Platelets: 222 10*3/uL (ref 150–400)
RBC: 4.52 MIL/uL (ref 3.87–5.11)
RDW: 13.4 % (ref 11.5–15.5)
WBC: 5.2 10*3/uL (ref 4.0–10.5)

## 2014-08-06 LAB — HEPATIC FUNCTION PANEL
ALBUMIN: 4.2 g/dL (ref 3.5–5.2)
ALT: 13 U/L (ref 0–35)
AST: 15 U/L (ref 0–37)
Alkaline Phosphatase: 57 U/L (ref 39–117)
BILIRUBIN TOTAL: 0.4 mg/dL (ref 0.2–1.2)
Bilirubin, Direct: 0.1 mg/dL (ref 0.0–0.3)
Indirect Bilirubin: 0.3 mg/dL (ref 0.2–1.2)
TOTAL PROTEIN: 6.5 g/dL (ref 6.0–8.3)

## 2014-08-06 LAB — BASIC METABOLIC PANEL WITH GFR
BUN: 10 mg/dL (ref 6–23)
CHLORIDE: 104 meq/L (ref 96–112)
CO2: 24 meq/L (ref 19–32)
Calcium: 9.3 mg/dL (ref 8.4–10.5)
Creat: 0.57 mg/dL (ref 0.50–1.10)
GFR, Est African American: >89 mL/min
GFR, Est Non African American: >89 mL/min
Glucose, Bld: 92 mg/dL (ref 70–99)
POTASSIUM: 4 meq/L (ref 3.5–5.3)
Sodium: 144 mEq/L (ref 135–145)

## 2014-08-06 LAB — LIPID PANEL
Cholesterol: 225 mg/dL — ABNORMAL HIGH (ref 0–200)
HDL: 47 mg/dL (ref >=46)
LDL Cholesterol: 156 mg/dL — ABNORMAL HIGH (ref 0–99)
Total CHOL/HDL Ratio: 4.8 Ratio
Triglycerides: 112 mg/dL (ref <150)
VLDL: 22 mg/dL (ref 0–40)

## 2014-08-06 LAB — MAGNESIUM: Magnesium: 2 mg/dL (ref 1.5–2.5)

## 2014-08-06 LAB — TSH: TSH: 0.728 u[IU]/mL (ref 0.350–4.500)

## 2014-08-06 NOTE — Patient Instructions (Signed)
Gastroesophageal Reflux Disease, Adult Gastroesophageal reflux disease (GERD) happens when acid from your stomach flows up into the esophagus. When acid comes in contact with the esophagus, the acid causes soreness (inflammation) in the esophagus. Over time, GERD may create small holes (ulcers) in the lining of the esophagus. CAUSES   Increased body weight. This puts pressure on the stomach, making acid rise from the stomach into the esophagus.  Smoking. This increases acid production in the stomach.  Drinking alcohol. This causes decreased pressure in the lower esophageal sphincter (valve or ring of muscle between the esophagus and stomach), allowing acid from the stomach into the esophagus.  Late evening meals and a full stomach. This increases pressure and acid production in the stomach.  A malformed lower esophageal sphincter. Sometimes, no cause is found. SYMPTOMS   Burning pain in the lower part of the mid-chest behind the breastbone and in the mid-stomach area. This may occur twice a week or more often.  Trouble swallowing.  Sore throat.  Dry cough.  Asthma-like symptoms including chest tightness, shortness of breath, or wheezing. DIAGNOSIS  Your caregiver may be able to diagnose GERD based on your symptoms. In some cases, X-rays and other tests may be done to check for complications or to check the condition of your stomach and esophagus. TREATMENT  Your caregiver may recommend over-the-counter or prescription medicines to help decrease acid production. Ask your caregiver before starting or adding any new medicines.  HOME CARE INSTRUCTIONS   Change the factors that you can control. Ask your caregiver for guidance concerning weight loss, quitting smoking, and alcohol consumption.  Avoid foods and drinks that make your symptoms worse, such as:  Caffeine or alcoholic drinks.  Chocolate.  Peppermint or mint flavorings.  Garlic and onions.  Spicy foods.  Citrus fruits,  such as oranges, lemons, or limes.  Tomato-based foods such as sauce, chili, salsa, and pizza.  Fried and fatty foods.  Avoid lying down for the 3 hours prior to your bedtime or prior to taking a nap.  Eat small, frequent meals instead of large meals.  Wear loose-fitting clothing. Do not wear anything tight around your waist that causes pressure on your stomach.  Raise the head of your bed 6 to 8 inches with wood blocks to help you sleep. Extra pillows will not help.  Only take over-the-counter or prescription medicines for pain, discomfort, or fever as directed by your caregiver.  Do not take aspirin, ibuprofen, or other nonsteroidal anti-inflammatory drugs (NSAIDs). SEEK IMMEDIATE MEDICAL CARE IF:   You have pain in your arms, neck, jaw, teeth, or back.  Your pain increases or changes in intensity or duration.  You develop nausea, vomiting, or sweating (diaphoresis).  You develop shortness of breath, or you faint.  Your vomit is green, yellow, black, or looks like coffee grounds or blood.  Your stool is red, bloody, or black. These symptoms could be signs of other problems, such as heart disease, gastric bleeding, or esophageal bleeding. MAKE SURE YOU:   Understand these instructions.  Will watch your condition.  Will get help right away if you are not doing well or get worse. Document Released: 10/21/2004 Document Revised: 04/05/2011 Document Reviewed: 07/31/2010 Exit[employer] Patient Information 2015 [employer]. This information is not intended to replace advice given to you by your health care provider. Make sure you discuss any questions you have with your health care provider.  

## 2014-08-06 NOTE — Progress Notes (Signed)
Assessment and Plan:  Hypertension: Continue medication, monitor blood pressure at home. Continue DASH diet.  Reminder to go to the ER if any CP, SOB, nausea, dizziness, severe HA, changes vision/speech, left arm numbness and tingling, and jaw pain. Cholesterol: Continue diet and exercise. Check cholesterol.  Pre-diabetes-Continue diet and exercise. Check A1C Vitamin D Def- check level and continue medications.  GERD- get on tums, STOP alcohol x 2-4 weeks, if not better follow up Dr. [name] for EGD Left breast mass- new left breast mass at 5 oclock, outer edge of nipple, PGM with breast cancer, on estrogen/progesterone x 2013 with no BCP before that   Continue diet and meds as discussed. Further disposition pending results of labs.  HPI 59 y.o. female  presents for 3 month follow up with hypertension, hyperlipidemia, prediabetes and vitamin D. Her blood pressure has been controlled at home, today their BP is BP: 118/68 mmHg She does workout. She denies chest pain, shortness of breath, dizziness.  She is not on cholesterol medication, she was on crestor but  and denies myalgias. Her cholesterol is at goal. The cholesterol last visit was:   Lab Results  Component Value Date   CHOL 181 01/28/2014   HDL 54 01/28/2014   LDLCALC 109* 01/28/2014   TRIG 88 01/28/2014   CHOLHDL 3.4 01/28/2014   She has been working on diet and exercise for prediabetes, and denies paresthesia of the feet, polydipsia, polyuria and visual disturbances. Last A1C in the office was:  Lab Results  Component Value Date   HGBA1C 5.3 01/28/2014   Patient is on Vitamin D supplement.   Lab Results  Component Value Date   VD25OH 14 01/28/2014     She is on estrogen and progesterone for hot flashes, she is not on bASA, low risk factors for DVT.  She has new lump under right breast that is tender x 2 days, no redness, warmth. Has started to go back to the gym. She has had some GERD, pressure in her chest with eating,  takes nexium OTC daily and still has issues. Will drink wine 3-4 days a week, no NSAIDS.   Current Medications:  Current Outpatient Prescriptions on File Prior to Visit  Medication Sig Dispense Refill  . ALPRAZolam (XANAX) 0.25 MG tablet Take 1 tablet (0.25 mg total) by mouth 3 (three) times daily as needed for anxiety. 90 tablet 0  . esomeprazole (NEXIUM) 40 MG capsule Take 1 capsule (40 mg total) by mouth daily. 30 capsule 0  . estradiol (ESTRACE) 0.5 MG tablet Take 1 tablet (0.5 mg total) by mouth daily. 90 tablet 4  . medroxyPROGESTERone (PROVERA) 2.5 MG tablet Take 2 tablets (5 mg total) by mouth daily. (Patient taking differently: Take 2.5 mg by mouth daily. ) 90 tablet 4  . metroNIDAZOLE (METROGEL) 0.75 % vaginal gel Place 1 Applicatorful vaginally at bedtime. 70 g 0  . oxybutynin (DITROPAN-XL) 10 MG 24 hr tablet daily.    . polyethylene glycol powder (GLYCOLAX/MIRALAX) powder TAKE 17 G BY MOUTH 2 (TWO) TIMES DAILY. 255 g 1  . valACYclovir (VALTREX) 1000 MG tablet Take 1 tablet by mouth  daily 90 tablet 3   No current facility-administered medications on file prior to visit.   Medical History:  Past Medical History  Diagnosis Date  . GERD (gastroesophageal reflux disease)   . IBS (irritable bowel syndrome)   . Migraines   . HSV-2 (herpes simplex virus 2) infection   . Anal fissure   . Anxiety   .  Interstitial cystitis   . Nevus     vulva nevus  . Allergy   . Prediabetes   . Hyperlipidemia   . Fibroids     Uterine   Allergies:  Allergies  Allergen Reactions  . Sulfa Antibiotics Hives, Shortness Of Breath and Rash  . Macrobid [Nitrofurantoin Macrocrystal] Nausea Only   Review of Systems  Constitutional: Negative.   HENT: Negative.   Eyes: Negative.   Respiratory: Negative.   Cardiovascular: Negative.   Gastrointestinal: Positive for heartburn, abdominal pain and diarrhea. Negative for nausea, vomiting, constipation, blood in stool and melena.  Genitourinary:  Negative.        ICS, new breast mass left breast  Musculoskeletal: Negative for myalgias, back pain, joint pain, falls and neck pain.  Skin: Negative.   Neurological: Negative for dizziness, tingling, tremors, sensory change, speech change, focal weakness, seizures and loss of consciousness.  Psychiatric/Behavioral: Negative.     Family history- Review and unchanged Social history- Review and unchanged Physical Exam: BP 118/68 mmHg  Pulse 56  Temp(Src) 97.7 F (36.5 C)  Resp 16  Wt 124 lb (56.246 kg)  LMP 01/20/2011 Wt Readings from Last 3 Encounters:  08/06/14 124 lb (56.246 kg)  07/18/14 125 lb (56.7 kg)  02/21/14 124 lb (56.246 kg)   General Appearance: Well nourished, in no apparent distress. Eyes: PERRLA, EOMs, conjunctiva no swelling or erythema Sinuses: No Frontal/maxillary tenderness ENT/Mouth: Ext aud canals clear, TMs without erythema, bulging. No erythema, swelling, or exudate on post pharynx.  Tonsils not swollen or erythematous. Hearing normal.  Neck: Supple, thyroid normal.  Respiratory: Respiratory effort normal, BS equal bilaterally without rales, rhonchi, wheezing or stridor.  Cardio: RRR with no MRGs. Brisk peripheral pulses without edema.  Abdomen: Soft, + BS.  epigastric tenderness, no guarding, rebound, hernias, masses. Lymphatics: Non tender without lymphadenopathy.  Musculoskeletal: Full ROM, 5/5 strength, normal gait.  Skin: Warm, dry without rashes, lesions, ecchymosis.  Neuro: Cranial nerves intact. Normal muscle tone, no cerebellar symptoms. Sensation intact.  Breast: new left breast mass at 5 oclock, outer edge of nipple mobile/slightly tender, but deep, no nipple discharge or retraction.  Psych: Awake and oriented X 3, normal affect, Insight and Judgment appropriate.    Patricia Mutters, PA-C 9:00 AM [facility]

## 2014-08-06 NOTE — Addendum Note (Signed)
Addended by: Vicie Mutters R on: 08/06/2014 09:53 AM   Modules accepted: Orders

## 2014-08-07 LAB — URINALYSIS, ROUTINE W REFLEX MICROSCOPIC
BILIRUBIN URINE: NEGATIVE
Glucose, UA: NEGATIVE mg/dL
KETONES UR: NEGATIVE mg/dL
LEUKOCYTES UA: NEGATIVE
Nitrite: NEGATIVE
Protein, ur: NEGATIVE mg/dL
SPECIFIC GRAVITY, URINE: 1.007 (ref 1.005–1.030)
UROBILINOGEN UA: 0.2 mg/dL (ref 0.0–1.0)
pH: 5.5 (ref 5.0–8.0)

## 2014-08-07 LAB — HEMOGLOBIN A1C
Hgb A1c MFr Bld: 5.7 % — ABNORMAL HIGH (ref <5.7)
Mean Plasma Glucose: 117 mg/dL — ABNORMAL HIGH (ref <117)

## 2014-08-07 LAB — URINALYSIS, MICROSCOPIC ONLY
Casts: NONE SEEN
Crystals: NONE SEEN
SQUAMOUS EPITHELIAL / LPF: NONE SEEN

## 2014-08-07 LAB — VITAMIN D 25 HYDROXY (VIT D DEFICIENCY, FRACTURES): VIT D 25 HYDROXY: 49 ng/mL (ref 30–100)

## 2014-08-08 ENCOUNTER — Other Ambulatory Visit: Payer: Self-pay

## 2014-08-08 LAB — URINE CULTURE
Colony Count: NO GROWTH
Organism ID, Bacteria: NO GROWTH

## 2014-08-08 MED ORDER — ROSUVASTATIN CALCIUM 20 MG PO TABS: 20.0000 mg | ORAL_TABLET | Freq: Every day | ORAL | Status: DC

## 2014-08-24 ENCOUNTER — Other Ambulatory Visit: Payer: Self-pay | Admitting: Obstetrics & Gynecology

## 2014-08-26 ENCOUNTER — Telehealth: Payer: Self-pay | Admitting: Obstetrics & Gynecology

## 2014-08-26 NOTE — Telephone Encounter (Signed)
Patient says her estradiol was denied, she thinks this was due to the dosage. Please call.

## 2014-08-26 NOTE — Telephone Encounter (Signed)
02/21/14 #90/4 rfs sent to [facility]-rx denied.

## 2014-08-27 NOTE — Telephone Encounter (Signed)
Call to patient. She states that [facility] does not have [facility] refills for Estradiol.  Year supply for both Estradiol  0.5 mg PO and Provera 2.5 mg po daily sent to [facility] [address] on 02/21/14 at annual exam with Dr. Sabra Heck. Patient confirms she is taking Provera 2.5 mg po daily.  [facility] and spoke with Thayer Headings. She was able to locate the refills for estradiol and Provera. She will process refill for estradiol at this time. It is too early for provera per Thayer Headings.  They will contact patient when prescription ready.   Call to patient and detailed message left to advise of update from [facility]. Detailed message okay per designated party release form.  Advised if any questions to return call. Routing to provider for final review.   Will close encounter.

## 2014-09-23 ENCOUNTER — Other Ambulatory Visit: Payer: Self-pay | Admitting: Obstetrics & Gynecology

## 2014-09-23 NOTE — Telephone Encounter (Signed)
Medication refill request: Estradiol  Last AEX:  02-21-14  Next AEX: 04-24-15 Last MMG (if hormonal medication request): 10-09-13 WNL Refill authorized: please advise

## 2014-10-21 ENCOUNTER — Other Ambulatory Visit: Payer: Self-pay | Admitting: Obstetrics & Gynecology

## 2014-10-31 ENCOUNTER — Other Ambulatory Visit: Payer: Self-pay | Admitting: Urgent Care

## 2014-11-06 ENCOUNTER — Other Ambulatory Visit: Payer: Self-pay | Admitting: Urgent Care

## 2014-11-06 ENCOUNTER — Telehealth: Payer: Self-pay

## 2014-11-06 MED ORDER — POLYETHYLENE GLYCOL 3350 17 GM/SCOOP PO POWD: ORAL | Status: DC

## 2014-11-06 NOTE — Telephone Encounter (Signed)
Patient is asking if you will call in refill for Miralax. Dr.Mani is the one who prescribes but he will not return patient's or pharmacy's call. Please advise

## 2014-11-07 ENCOUNTER — Other Ambulatory Visit: Payer: Self-pay | Admitting: Physician Assistant

## 2014-11-24 ENCOUNTER — Other Ambulatory Visit: Payer: Self-pay | Admitting: Obstetrics & Gynecology

## 2014-11-25 NOTE — Telephone Encounter (Signed)
Medication refill request: Estrace  Last AEX:  02/21/14 SM Next AEX: 04/24/15 SM Last MMG (if hormonal medication request): 10/09/13 BIRADS1:Neg Refill authorized: 09/23/14 #90tabs/ 3R. To [facility] [address]

## 2014-12-02 ENCOUNTER — Other Ambulatory Visit: Payer: Self-pay

## 2014-12-02 ENCOUNTER — Other Ambulatory Visit: Payer: Self-pay | Admitting: Physician Assistant

## 2014-12-02 MED ORDER — ROSUVASTATIN CALCIUM 20 MG PO TABS: 20.0000 mg | ORAL_TABLET | Freq: Every day | ORAL | Status: DC

## 2014-12-02 MED ORDER — ATORVASTATIN CALCIUM 40 MG PO TABS: 40.0000 mg | ORAL_TABLET | Freq: Every day | ORAL | Status: DC

## 2014-12-24 ENCOUNTER — Other Ambulatory Visit: Payer: Self-pay | Admitting: Obstetrics & Gynecology

## 2014-12-24 NOTE — Telephone Encounter (Signed)
Medication refill request: Estradiol Last AEX:  02/21/14 MM Next AEX: 04/24/2015 MM Last MMG (if hormonal medication request): 10/09/13 BI-RADS, Category 1:Negative Refill authorized: 09/23/2014 #90tablets, 3Refills. Please Advise

## 2015-02-03 ENCOUNTER — Encounter: Payer: Self-pay | Admitting: Physician Assistant

## 2015-02-07 ENCOUNTER — Encounter: Payer: Self-pay | Admitting: Internal Medicine

## 2015-02-07 ENCOUNTER — Ambulatory Visit (INDEPENDENT_AMBULATORY_CARE_PROVIDER_SITE_OTHER): Payer: 59 | Admitting: Internal Medicine

## 2015-02-07 VITALS — BP 100/62 | HR 80 | Temp 98.2°F | Resp 18 | Ht 63.5 in | Wt 124.0 lb

## 2015-02-07 DIAGNOSIS — E785 Hyperlipidemia, unspecified: Secondary | ICD-10-CM

## 2015-02-07 DIAGNOSIS — Z0001 Encounter for general adult medical examination with abnormal findings: Secondary | ICD-10-CM

## 2015-02-07 DIAGNOSIS — Z0000 Encounter for general adult medical examination without abnormal findings: Secondary | ICD-10-CM

## 2015-02-07 DIAGNOSIS — Z130 Encounter for screening for diseases of the blood and blood-forming organs and certain disorders involving the immune mechanism: Secondary | ICD-10-CM

## 2015-02-07 DIAGNOSIS — Z1329 Encounter for screening for other suspected endocrine disorder: Secondary | ICD-10-CM

## 2015-02-07 DIAGNOSIS — I10 Essential (primary) hypertension: Secondary | ICD-10-CM | POA: Diagnosis not present

## 2015-02-07 DIAGNOSIS — R7303 Prediabetes: Secondary | ICD-10-CM

## 2015-02-07 DIAGNOSIS — Z79899 Other long term (current) drug therapy: Secondary | ICD-10-CM

## 2015-02-07 DIAGNOSIS — Z136 Encounter for screening for cardiovascular disorders: Secondary | ICD-10-CM

## 2015-02-07 DIAGNOSIS — E559 Vitamin D deficiency, unspecified: Secondary | ICD-10-CM

## 2015-02-07 DIAGNOSIS — Z1389 Encounter for screening for other disorder: Secondary | ICD-10-CM

## 2015-02-07 LAB — HEMOGLOBIN A1C
Hgb A1c MFr Bld: 5.8 % — ABNORMAL HIGH (ref <5.7)
Mean Plasma Glucose: 120 mg/dL — ABNORMAL HIGH (ref <117)

## 2015-02-07 LAB — LIPID PANEL
Cholesterol: 180 mg/dL (ref 125–200)
HDL: 61 mg/dL (ref >=46)
LDL Cholesterol: 102 mg/dL (ref <130)
TRIGLYCERIDES: 86 mg/dL (ref <150)
Total CHOL/HDL Ratio: 3 Ratio (ref <=5.0)
VLDL: 17 mg/dL (ref <30)

## 2015-02-07 LAB — HEPATIC FUNCTION PANEL
ALBUMIN: 4.5 g/dL (ref 3.6–5.1)
ALT: 18 U/L (ref 6–29)
AST: 18 U/L (ref 10–35)
Alkaline Phosphatase: 56 U/L (ref 33–130)
BILIRUBIN TOTAL: 0.4 mg/dL (ref 0.2–1.2)
Bilirubin, Direct: 0.1 mg/dL (ref <=0.2)
Indirect Bilirubin: 0.3 mg/dL (ref 0.2–1.2)
Total Protein: 6.8 g/dL (ref 6.1–8.1)

## 2015-02-07 LAB — CBC WITH DIFFERENTIAL/PLATELET
BASOS ABS: 0.1 10*3/uL (ref 0.0–0.1)
Basophils Relative: 1 % (ref 0–1)
EOS PCT: 2 % (ref 0–5)
Eosinophils Absolute: 0.1 10*3/uL (ref 0.0–0.7)
HEMATOCRIT: 43.1 % (ref 36.0–46.0)
HEMOGLOBIN: 14.5 g/dL (ref 12.0–15.0)
LYMPHS ABS: 1.4 10*3/uL (ref 0.7–4.0)
LYMPHS PCT: 23 % (ref 12–46)
MCH: 30.8 pg (ref 26.0–34.0)
MCHC: 33.6 g/dL (ref 30.0–36.0)
MCV: 91.5 fL (ref 78.0–100.0)
MPV: 9.6 fL (ref 8.6–12.4)
Monocytes Absolute: 0.7 10*3/uL (ref 0.1–1.0)
Monocytes Relative: 12 % (ref 3–12)
NEUTROS ABS: 3.8 10*3/uL (ref 1.7–7.7)
Neutrophils Relative %: 62 % (ref 43–77)
Platelets: 224 10*3/uL (ref 150–400)
RBC: 4.71 MIL/uL (ref 3.87–5.11)
RDW: 13.4 % (ref 11.5–15.5)
WBC: 6.2 10*3/uL (ref 4.0–10.5)

## 2015-02-07 LAB — IRON AND TIBC
%SAT: 36 % (ref 11–50)
IRON: 124 ug/dL (ref 45–160)
TIBC: 346 ug/dL (ref 250–450)
UIBC: 222 ug/dL (ref 125–400)

## 2015-02-07 LAB — BASIC METABOLIC PANEL WITH GFR
BUN: 12 mg/dL (ref 7–25)
CALCIUM: 9.6 mg/dL (ref 8.6–10.4)
CO2: 28 mmol/L (ref 20–31)
CREATININE: 0.61 mg/dL (ref 0.50–1.05)
Chloride: 103 mmol/L (ref 98–110)
Glucose, Bld: 86 mg/dL (ref 65–99)
Potassium: 4.4 mmol/L (ref 3.5–5.3)
SODIUM: 140 mmol/L (ref 135–146)

## 2015-02-07 LAB — MAGNESIUM: MAGNESIUM: 2 mg/dL (ref 1.5–2.5)

## 2015-02-07 MED ORDER — PREDNISONE 20 MG PO TABS: ORAL_TABLET | ORAL | Status: DC

## 2015-02-07 MED ORDER — ALPRAZOLAM 0.25 MG PO TABS: 0.2500 mg | ORAL_TABLET | Freq: Three times a day (TID) | ORAL | Status: DC | PRN

## 2015-02-07 MED ORDER — POLYETHYLENE GLYCOL 3350 17 GM/SCOOP PO POWD: ORAL | Status: DC

## 2015-02-07 NOTE — Patient Instructions (Signed)
Preventive Care for Adults  A healthy lifestyle and preventive care can promote health and wellness. Preventive health guidelines for women include the following key practices.  A routine yearly physical is a good way to check with your health care provider about your health and preventive screening. It is a chance to share any concerns and updates on your health and to receive a thorough exam.  Visit your dentist for a routine exam and preventive care every 6 months. Brush your teeth twice a day and floss once a day. Good oral hygiene prevents tooth decay and gum disease.  The frequency of eye exams is based on your age, health, family medical history, use of contact lenses, and other factors. Follow your health care provider's recommendations for frequency of eye exams.  Eat a healthy diet. Foods like vegetables, fruits, whole grains, low-fat dairy products, and lean protein foods contain the nutrients you need without too many calories. Decrease your intake of foods high in solid fats, added sugars, and salt. Eat the right amount of calories for you.Get information about a proper diet from your health care provider, if necessary.  Regular physical exercise is one of the most important things you can do for your health. Most adults should get at least 150 minutes of moderate-intensity exercise (any activity that increases your heart rate and causes you to sweat) each week. In addition, most adults need muscle-strengthening exercises on 2 or more days a week.  Maintain a healthy weight. The body mass index (BMI) is a screening tool to identify possible weight problems. It provides an estimate of body fat based on height and weight. Your health care provider can find your BMI and can help you achieve or maintain a healthy weight.For adults 20 years and older:  A BMI below 18.5 is considered underweight.  A BMI of 18.5 to 24.9 is normal.  A BMI of 25 to 29.9 is considered overweight.  A BMI  of 30 and above is considered obese.  Maintain normal blood lipids and cholesterol levels by exercising and minimizing your intake of saturated fat. Eat a balanced diet with plenty of fruit and vegetables. Blood tests for lipids and cholesterol should begin at age 58 and be repeated every 5 years. If your lipid or cholesterol levels are high, you are over 50, or you are at high risk for heart disease, you may need your cholesterol levels checked more frequently.Ongoing high lipid and cholesterol levels should be treated with medicines if diet and exercise are not working.  If you smoke, find out from your health care provider how to quit. If you do not use tobacco, do not start.  Lung cancer screening is recommended for adults aged 58-80 years who are at high risk for developing lung cancer because of a history of smoking. A yearly low-dose CT scan of the lungs is recommended for people who have at least a 30-pack-year history of smoking and are a current smoker or have quit within the past 15 years. A pack year of smoking is smoking an average of 1 pack of cigarettes a day for 1 year (for example: 1 pack a day for 30 years or 2 packs a day for 15 years). Yearly screening should continue until the smoker has stopped smoking for at least 15 years. Yearly screening should be stopped for people who develop a health problem that would prevent them from having lung cancer treatment.  High blood pressure causes heart disease and increases the risk of  stroke. Your blood pressure should be checked at least every 1 to 2 years. Ongoing high blood pressure should be treated with medicines if weight loss and exercise do not work.  If you are 55-79 years old, ask your health care provider if you should take aspirin to prevent strokes.  Diabetes screening involves taking a blood sample to check your fasting blood sugar level. This should be done once every 3 years, after age 45, if you are within normal weight and  without risk factors for diabetes. Testing should be considered at a younger age or be carried out more frequently if you are overweight and have at least 1 risk factor for diabetes.  Breast cancer screening is essential preventive care for women. You should practice "breast self-awareness." This means understanding the normal appearance and feel of your breasts and may include breast self-examination. Any changes detected, no matter how small, should be reported to a health care provider. Women in their 20s and 30s should have a clinical breast exam (CBE) by a health care provider as part of a regular health exam every 1 to 3 years. After age 40, women should have a CBE every year. Starting at age 40, women should consider having a mammogram (breast X-ray test) every year. Women who have a family history of breast cancer should talk to their health care provider about genetic screening. Women at a high risk of breast cancer should talk to their health care providers about having an MRI and a mammogram every year.  Breast cancer gene (BRCA)-related cancer risk assessment is recommended for women who have family members with BRCA-related cancers. BRCA-related cancers include breast, ovarian, tubal, and peritoneal cancers. Having family members with these cancers may be associated with an increased risk for harmful changes (mutations) in the breast cancer genes BRCA1 and BRCA2. Results of the assessment will determine the need for genetic counseling and BRCA1 and BRCA2 testing.  Routine pelvic exams to screen for cancer are no longer recommended for nonpregnant women who are considered low risk for cancer of the pelvic organs (ovaries, uterus, and vagina) and who do not have symptoms. Ask your health care provider if a screening pelvic exam is right for you.  If you have had past treatment for cervical cancer or a condition that could lead to cancer, you need Pap tests and screening for cancer for at least 20  years after your treatment. If Pap tests have been discontinued, your risk factors (such as having a new sexual partner) need to be reassessed to determine if screening should be resumed. Some women have medical problems that increase the chance of getting cervical cancer. In these cases, your health care provider may recommend more frequent screening and Pap tests.  Colorectal cancer can be detected and often prevented. Most routine colorectal cancer screening begins at the age of 50 years and continues through age 75 years. However, your health care provider may recommend screening at an earlier age if you have risk factors for colon cancer. On a yearly basis, your health care provider may provide home test kits to check for hidden blood in the stool. Use of a small camera at the end of a tube, to directly examine the colon (sigmoidoscopy or colonoscopy), can detect the earliest forms of colorectal cancer. Talk to your health care provider about this at age 50, when routine screening begins. Direct exam of the colon should be repeated every 5-10 years through age 75 years, unless early forms of pre-cancerous   polyps or small growths are found.  Hepatitis C blood testing is recommended for all people born from 1945 through 1965 and any individual with known risks for hepatitis C.  Pra  Osteoporosis is a disease in which the bones lose minerals and strength with aging. This can result in serious bone fractures or breaks. The risk of osteoporosis can be identified using a bone density scan. Women ages 65 years and over and women at risk for fractures or osteoporosis should discuss screening with their health care providers. Ask your health care provider whether you should take a calcium supplement or vitamin D to reduce the rate of osteoporosis.  Menopause can be associated with physical symptoms and risks. Hormone replacement therapy is available to decrease symptoms and risks. You should talk to your  health care provider about whether hormone replacement therapy is right for you.  Use sunscreen. Apply sunscreen liberally and repeatedly throughout the day. You should seek shade when your shadow is shorter than you. Protect yourself by wearing long sleeves, pants, a wide-brimmed hat, and sunglasses year round, whenever you are outdoors.  Once a month, do a whole body skin exam, using a mirror to look at the skin on your back. Tell your health care provider of new moles, moles that have irregular borders, moles that are larger than a pencil eraser, or moles that have changed in shape or color.  Stay current with required vaccines (immunizations).  Influenza vaccine. All adults should be immunized every year.  Tetanus, diphtheria, and acellular pertussis (Td, Tdap) vaccine. Pregnant women should receive 1 dose of Tdap vaccine during each pregnancy. The dose should be obtained regardless of the length of time since the last dose. Immunization is preferred during the 27th-36th week of gestation. An adult who has not previously received Tdap or who does not know her vaccine status should receive 1 dose of Tdap. This initial dose should be followed by tetanus and diphtheria toxoids (Td) booster doses every 10 years. Adults with an unknown or incomplete history of completing a 3-dose immunization series with Td-containing vaccines should begin or complete a primary immunization series including a Tdap dose. Adults should receive a Td booster every 10 years.  Varicella vaccine. An adult without evidence of immunity to varicella should receive 2 doses or a second dose if she has previously received 1 dose. Pregnant females who do not have evidence of immunity should receive the first dose after pregnancy. This first dose should be obtained before leaving the health care facility. The second dose should be obtained 4-8 weeks after the first dose.  Human papillomavirus (HPV) vaccine. Females aged 13-26 years  who have not received the vaccine previously should obtain the 3-dose series. The vaccine is not recommended for use in pregnant females. However, pregnancy testing is not needed before receiving a dose. If a female is found to be pregnant after receiving a dose, no treatment is needed. In that case, the remaining doses should be delayed until after the pregnancy. Immunization is recommended for any person with an immunocompromised condition through the age of 26 years if she did not get any or all doses earlier. During the 3-dose series, the second dose should be obtained 4-8 weeks after the first dose. The third dose should be obtained 24 weeks after the first dose and 16 weeks after the second dose.  Zoster vaccine. One dose is recommended for adults aged 60 years or older unless certain conditions are present.  Measles, mumps, and rubella (  MMR) vaccine. Adults born before [birth year] generally are considered immune to measles and mumps. Adults born in [birth year] or later should have 1 or more doses of MMR vaccine unless there is a contraindication to the vaccine or there is laboratory evidence of immunity to each of the three diseases. A routine second dose of MMR vaccine should be obtained at least 28 days after the first dose for students attending postsecondary schools, health care workers, or international travelers. People who received inactivated measles vaccine or an unknown type of measles vaccine during 1963-1967 should receive 2 doses of MMR vaccine. People who received inactivated mumps vaccine or an unknown type of mumps vaccine before 1979 and are at high risk for mumps infection should consider immunization with 2 doses of MMR vaccine. For females of childbearing age, rubella immunity should be determined. If there is no evidence of immunity, females who are not pregnant should be vaccinated. If there is no evidence of immunity, females who are pregnant should delay immunization until after pregnancy.  Unvaccinated health care workers born before [birth year] who lack laboratory evidence of measles, mumps, or rubella immunity or laboratory confirmation of disease should consider measles and mumps immunization with 2 doses of MMR vaccine or rubella immunization with 1 dose of MMR vaccine.  Pneumococcal 13-valent conjugate (PCV13) vaccine. When indicated, a person who is uncertain of her immunization history and has no record of immunization should receive the PCV13 vaccine. An adult aged 39 years or older who has certain medical conditions and has not been previously immunized should receive 1 dose of PCV13 vaccine. This PCV13 should be followed with a dose of pneumococcal polysaccharide (PPSV23) vaccine. The PPSV23 vaccine dose should be obtained at least 8 weeks after the dose of PCV13 vaccine. An adult aged 62 years or older who has certain medical conditions and previously received 1 or more doses of PPSV23 vaccine should receive 1 dose of PCV13. The PCV13 vaccine dose should be obtained 1 or more years after the last PPSV23 vaccine dose.    Pneumococcal polysaccharide (PPSV23) vaccine. When PCV13 is also indicated, PCV13 should be obtained first. All adults aged 67 years and older should be immunized. An adult younger than age 45 years who has certain medical conditions should be immunized. Any person who resides in a nursing home or long-term care facility should be immunized. An adult smoker should be immunized. People with an immunocompromised condition and certain other conditions should receive both PCV13 and PPSV23 vaccines. People with human immunodeficiency virus (HIV) infection should be immunized as soon as possible after diagnosis. Immunization during chemotherapy or radiation therapy should be avoided. Routine use of PPSV23 vaccine is not recommended for American Indians, [place] Natives, or people younger than 65 years unless there are medical conditions that require PPSV23 vaccine. When indicated,  people who have unknown immunization and have no record of immunization should receive PPSV23 vaccine. One-time revaccination 5 years after the first dose of PPSV23 is recommended for people aged 19-64 years who have chronic kidney failure, nephrotic syndrome, asplenia, or immunocompromised conditions. People who received 1-2 doses of PPSV23 before age 23 years should receive another dose of PPSV23 vaccine at age 35 years or later if at least 5 years have passed since the previous dose. Doses of PPSV23 are not needed for people immunized with PPSV23 at or after age 38 years.  Preventive Services / Frequency   Ages 43 to 86 years  Blood pressure check.  Lipid and cholesterol check.  Lung  cancer screening. / Every year if you are aged 63-80 years and have a 30-pack-year history of smoking and currently smoke or have quit within the past 15 years. Yearly screening is stopped once you have quit smoking for at least 15 years or develop a health problem that would prevent you from having lung cancer treatment.  Clinical breast exam.** / Every year after age 28 years.  BRCA-related cancer risk assessment.** / For women who have family members with a BRCA-related cancer (breast, ovarian, tubal, or peritoneal cancers).  Mammogram.** / Every year beginning at age 37 years and continuing for as long as you are in good health. Consult with your health care provider.  Pap test.** / Every 3 years starting at age 67 years through age 4 or 2 years with a history of 3 consecutive normal Pap tests.  HPV screening.** / Every 3 years from ages 39 years through ages 38 to 63 years with a history of 3 consecutive normal Pap tests.  Fecal occult blood test (FOBT) of stool. / Every year beginning at age 50 years and continuing until age 19 years. You may not need to do this test if you get a colonoscopy every 10 years.  Flexible sigmoidoscopy or colonoscopy.** / Every 5 years for a flexible sigmoidoscopy or  every 10 years for a colonoscopy beginning at age 51 years and continuing until age 21 years.  Hepatitis C blood test.** / For all people born from [birth year] through 1965 and any individual with known risks for hepatitis C.  Skin self-exam. / Monthly.  Influenza vaccine. / Every year.  Tetanus, diphtheria, and acellular pertussis (Tdap/Td) vaccine.** / Consult your health care provider. Pregnant women should receive 1 dose of Tdap vaccine during each pregnancy. 1 dose of Td every 10 years.  Varicella vaccine.** / Consult your health care provider. Pregnant females who do not have evidence of immunity should receive the first dose after pregnancy.  Zoster vaccine.** / 1 dose for adults aged 14 years or older.  Pneumococcal 13-valent conjugate (PCV13) vaccine.** / Consult your health care provider.  Pneumococcal polysaccharide (PPSV23) vaccine.** / 1 to 2 doses if you smoke cigarettes or if you have certain conditions.  Meningococcal vaccine.** / Consult your health care provider.  Hepatitis A vaccine.** / Consult your health care provider.  Hepatitis B vaccine.** / Consult your health care provider. Screening for abdominal aortic aneurysm (AAA)  by ultrasound is recommended for people over 50 who have history of high blood pressure or who are current or former smokers.

## 2015-02-07 NOTE — Progress Notes (Signed)
Patient ID: Patricia Ryan, female   DOB: 01/12/1956, 61 y.o.   MRN: [id number]  Complete Physical  Assessment and Plan:   1. Encounter for general adult medical examination with abnormal findings   2. Prediabetes  - Hemoglobin A1c - Insulin, random  3. Hyperlipidemia  - Lipid panel  4. Medication management  - CBC with Differential/Platelet - BASIC METABOLIC PANEL WITH GFR - Hepatic function panel - Magnesium  5. Screening for deficiency anemia  - Iron and TIBC - Vitamin B12  6. Screening for hematuria or proteinuria  - Urinalysis, Routine w reflex microscopic (not at [facility]) - Microalbumin / creatinine urine ratio  7. Screening for cardiovascular condition  - EKG 12-Lead - [place], RETROPERITNL ABD,  LTD  8. Vitamin D deficiency  - VITAMIN D 25 Hydroxy (Vit-D Deficiency, Fractures)  9. Screening for thyroid disorder  - TSH    Discussed med's effects and SE's. Screening labs and tests as requested with regular follow-up as recommended.  HPI  60 y.o. female  presents for a complete physical.  Her blood pressure has been controlled at home, today their BP is BP: 100/62 mmHg.  She does workout. She denies chest pain, shortness of breath, dizziness.   She is on cholesterol medication and denies myalgias. Her cholesterol is not at goal. The cholesterol last visit was:  Lab Results  Component Value Date   CHOL 225* 08/06/2014   HDL 47 08/06/2014   LDLCALC 156* 08/06/2014   TRIG 112 08/06/2014   CHOLHDL 4.8 08/06/2014  .  She has been working on diet and exercise for prediabetes, she is on bASA, she is not on ACE/ARB and denies foot ulcerations, hyperglycemia, hypoglycemia , increased appetite, nausea, paresthesia of the feet, polydipsia, polyuria, visual disturbances, vomiting and weight loss. Last A1C in the office was:  Lab Results  Component Value Date   HGBA1C 5.7* 08/06/2014    Patient is on Vitamin D supplement.   Lab Results  Component Value Date    VD25OH 49 08/06/2014     Patient reports that for the past couple months she has been having pain at the base of her left thumb.   She is right hand dominant and she also reports that she does have to use her left hand for her job.  She has not tried any medications. Mom had history of arthritis in her hands  She also reports that she has been having a scratchy throat and congestion, post nasal drip that has been going on for the past week.  She hasn't taken anything for it other than nyquil.    She does see Dr. Sabra Heck for Obgyn.  She is due to see her at the end of the month.  Current Medications:  Current Outpatient Prescriptions on File Prior to Visit  Medication Sig Dispense Refill  . ALPRAZolam (XANAX) 0.25 MG tablet Take 1 tablet (0.25 mg total) by mouth 3 (three) times daily as needed for anxiety. 90 tablet 0  . Cholecalciferol (VITAMIN D PO) Take 400 Int'l Units by mouth daily.    Marland Kitchen esomeprazole (NEXIUM) 40 MG capsule Take 1 capsule (40 mg total) by mouth daily. 30 capsule 0  . estradiol (ESTRACE) 0.5 MG tablet TAKE 1 TABLET (0.5 MG TOTAL) BY MOUTH DAILY 90 tablet 0  . medroxyPROGESTERone (PROVERA) 2.5 MG tablet Take 2 tablets (5 mg total) by mouth daily. (Patient taking differently: Take 2.5 mg by mouth daily. ) 90 tablet 4  . oxybutynin (DITROPAN-XL) 10 MG 24  hr tablet daily.    . polyethylene glycol powder (GLYCOLAX/MIRALAX) powder MIX 17 GRAMS AS DIRECTED TO TAKE BY MOUTH TWICE A DAY AS DIRECTED 255 g 1  . rosuvastatin (CRESTOR) 20 MG tablet Take 1 tablet (20 mg total) by mouth daily. 30 tablet 3  . valACYclovir (VALTREX) 1000 MG tablet Take 1 tablet by mouth  daily 90 tablet 3   No current facility-administered medications on file prior to visit.    Health Maintenance:   Immunization History  Administered Date(s) Administered  . DTaP 01/26/2004  . Tdap 01/28/2014    Tetanus: 2016 Flu:  Sept 2016 Pap: Managed by Obgyn MGM: Managed by Obgyn Colonoscopy: June  2014 Last Dental Exam: Dr. Ennis Forts, twice yearly Last Eye Exam:  Dr. Earl Gala  Patient Care Team: Unk Pinto, MD as PCP - General (Internal Medicine) Lafayette Dragon, MD as Consulting Physician (Gastroenterology)  Allergies:  Allergies  Allergen Reactions  . Sulfa Antibiotics Hives, Shortness Of Breath and Rash  . Macrobid [Nitrofurantoin Macrocrystal] Nausea Only    Medical History:  Past Medical History  Diagnosis Date  . GERD (gastroesophageal reflux disease)   . IBS (irritable bowel syndrome)   . Migraines   . HSV-2 (herpes simplex virus 2) infection   . Anal fissure   . Anxiety   . Interstitial cystitis   . Nevus     vulva nevus  . Allergy   . Prediabetes   . Hyperlipidemia   . Fibroids     Uterine    Surgical History:  Past Surgical History  Procedure Laterality Date  . Cholecystectomy    . Tubal reconstruction    . Bladder repair    . Tonsillectomy    . Vaginal delivery      x2  . 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 Tubalplasty  1985/ 1990   . Bunionectomy      Family History:  Family History  Problem Relation Age of Onset  . Celiac disease Mother   . Heart disease Father   . Kidney disease      tumor  . Irritable bowel syndrome Sister   . Thyroid disease Sister   . Pancreatic cancer Maternal Grandmother   . Lung cancer Maternal Grandmother   . Diabetes Paternal Grandmother   . Breast cancer Paternal Grandmother   . Colon cancer Neg Hx     Social History:  Social History  Substance Use Topics  . Smoking status: Former Smoker    Types: Cigarettes    Quit date: 12/26/2011  . Smokeless tobacco: Never Used  . Alcohol Use: 1.2 oz/week    2 Standard drinks or equivalent per week     Comment: weekends mostly    Review of Systems: Review of Systems  Constitutional: Negative for fever, chills and malaise/fatigue.  HENT: Positive for congestion. Negative for ear pain and sore throat.   Eyes: Negative.   Respiratory: Positive  for cough. Negative for sputum production, shortness of breath and wheezing.   Cardiovascular: Positive for palpitations (occasional but not bothersome). Negative for chest pain and leg swelling.  Gastrointestinal: Positive for heartburn and constipation. Negative for diarrhea, blood in stool and melena.  Genitourinary: Negative.   Skin: Negative.   Neurological: Negative for dizziness, sensory change, loss of consciousness and headaches.  Psychiatric/Behavioral: Negative for depression. The patient is not nervous/anxious and does not have insomnia.     Physical Exam: Estimated body mass index is 21.62 kg/(m^2) as calculated from  the following:   Height as of this encounter: 5' 3.5" (1.613 m).   Weight as of this encounter: 124 lb (56.246 kg). BP 100/62 mmHg  Pulse 80  Temp(Src) 98.2 F (36.8 C) (Temporal)  Resp 18  Ht 5' 3.5" (1.613 m)  Wt 124 lb (56.246 kg)  BMI 21.62 kg/m2  LMP 01/20/2011  General Appearance: Well nourished well developed, in no apparent distress.  Eyes: PERRLA, EOMs, conjunctiva no swelling or erythema ENT/Mouth: Ear canals normal without obstruction, swelling, erythema, or discharge.  TMs normal bilaterally with no erythema, bulging, retraction, or loss of landmark.  Oropharynx moist and clear with no exudate, erythema, or swelling.   Neck: Supple, thyroid normal. No bruits.  No cervical adenopathy Respiratory: Respiratory effort normal, Breath sounds clear A&P without wheeze, rhonchi, rales. Cardio: RRR without murmurs, rubs or gallops. Brisk peripheral pulses without edema.  Chest: symmetric, with normal excursions Breasts:Deferred to Gyn  Abdomen: Soft, nontender, no guarding, rebound, hernias, masses, or organomegaly.  Lymphatics: Non tender without lymphadenopathy.  Genitourinary: Deferred to Gyn Musculoskeletal: Full ROM all peripheral extremities,5/5 strength, and normal gait.  Skin: Warm, dry without rashes, lesions, ecchymosis. Neuro: Awake and  oriented X 3, Cranial nerves intact, reflexes equal bilaterally. Normal muscle tone, no cerebellar symptoms. Sensation intact.  Psych:  normal affect, Insight and Judgment appropriate.   EKG: WNL no changes.  AORTA SCAN: WNL   Over 40 minutes of exam, counseling, chart review and critical decision making was performed  Loma Sousa Forcucci 10:14 AM [facility]

## 2015-02-08 LAB — VITAMIN D 25 HYDROXY (VIT D DEFICIENCY, FRACTURES): VIT D 25 HYDROXY: 48 ng/mL (ref 30–100)

## 2015-02-08 LAB — URINALYSIS, ROUTINE W REFLEX MICROSCOPIC
BILIRUBIN URINE: NEGATIVE
GLUCOSE, UA: NEGATIVE
HGB URINE DIPSTICK: NEGATIVE
Ketones, ur: NEGATIVE
Leukocytes, UA: NEGATIVE
Nitrite: NEGATIVE
PROTEIN: NEGATIVE
Specific Gravity, Urine: 1.01 (ref 1.001–1.035)
pH: 6 (ref 5.0–8.0)

## 2015-02-08 LAB — TSH: TSH: 0.756 u[IU]/mL (ref 0.350–4.500)

## 2015-02-08 LAB — MICROALBUMIN / CREATININE URINE RATIO
Creatinine, Urine: 52 mg/dL (ref 20–320)
Microalb, Ur: <0.2 mg/dL

## 2015-02-08 LAB — INSULIN, RANDOM: Insulin: 5.1 u[IU]/mL (ref 2.0–19.6)

## 2015-02-08 LAB — VITAMIN B12: VITAMIN B 12: 463 pg/mL (ref 211–911)

## 2015-02-19 ENCOUNTER — Other Ambulatory Visit: Payer: Self-pay | Admitting: Internal Medicine

## 2015-03-15 ENCOUNTER — Encounter: Payer: Self-pay | Admitting: *Deleted

## 2015-03-23 ENCOUNTER — Other Ambulatory Visit: Payer: Self-pay | Admitting: Obstetrics & Gynecology

## 2015-03-24 NOTE — Telephone Encounter (Signed)
Medication refill request: estrace Last AEX:  02-21-14 Next AEX: 04-24-15 Last MMG (if hormonal medication request): DX MMG 08-14-14- need additional imaging - Left breast U/S 08-14-14 WNL  Refill authorized: please advise

## 2015-04-16 ENCOUNTER — Other Ambulatory Visit: Payer: Self-pay | Admitting: Obstetrics & Gynecology

## 2015-04-16 NOTE — Telephone Encounter (Signed)
Medication refill request: Provera 2.5 mg  Last AEX: 02/21/2014 with SM  Next AEX: 04/24/2015 with SM  Last MMG (if hormonal medication request): 08/14/2014 ; left breast ultrasound bi-rads 1: negative Refill authorized: Please advise  Routed to Dr. Quincy Simmonds since Dr. Sabra Heck is out of the office.

## 2015-04-24 ENCOUNTER — Encounter: Payer: Self-pay | Admitting: Obstetrics & Gynecology

## 2015-04-24 ENCOUNTER — Ambulatory Visit (INDEPENDENT_AMBULATORY_CARE_PROVIDER_SITE_OTHER): Payer: 59 | Admitting: Obstetrics & Gynecology

## 2015-04-24 VITALS — BP 102/60 | HR 66 | Resp 16 | Ht 63.0 in | Wt 122.0 lb

## 2015-04-24 DIAGNOSIS — Z0000 Encounter for general adult medical examination without abnormal findings: Secondary | ICD-10-CM

## 2015-04-24 DIAGNOSIS — Z124 Encounter for screening for malignant neoplasm of cervix: Secondary | ICD-10-CM

## 2015-04-24 DIAGNOSIS — Z01419 Encounter for gynecological examination (general) (routine) without abnormal findings: Secondary | ICD-10-CM

## 2015-04-24 DIAGNOSIS — Z205 Contact with and (suspected) exposure to viral hepatitis: Secondary | ICD-10-CM

## 2015-04-24 DIAGNOSIS — B977 Papillomavirus as the cause of diseases classified elsewhere: Secondary | ICD-10-CM

## 2015-04-24 LAB — POCT URINALYSIS DIPSTICK
Bilirubin, UA: NEGATIVE
GLUCOSE UA: NEGATIVE
Ketones, UA: NEGATIVE
Leukocytes, UA: NEGATIVE
Nitrite, UA: NEGATIVE
PROTEIN UA: NEGATIVE
UROBILINOGEN UA: NEGATIVE
pH, UA: 5

## 2015-04-24 MED ORDER — ESTROGENS, CONJUGATED 0.625 MG/GM VA CREA: TOPICAL_CREAM | VAGINAL | Status: DC

## 2015-04-24 MED ORDER — VALACYCLOVIR HCL 1 G PO TABS: ORAL_TABLET | ORAL | Status: DC

## 2015-04-24 MED ORDER — OXYBUTYNIN CHLORIDE ER 10 MG PO TB24: 10.0000 mg | ORAL_TABLET | Freq: Every day | ORAL | Status: DC

## 2015-04-24 NOTE — Patient Instructions (Signed)
Estradiol 0.5mg  daily Provera 2.5mg  daily This is the dosage I would prefer you to take.

## 2015-04-24 NOTE — Progress Notes (Signed)
60 y.o. G2P2 MarriedCaucasianF here for annual exam.  Doing well.  No vaginal bleeding.  Pt reports she started decreasing her Valtrex and did have an outbreak so has gone back to the 1gm daily dosage.  Patient's last menstrual period was 01/20/2011.          Sexually active: Yes.    The current method of family planning is post menopausal status.    Exercising: Yes.    gym 2-5 days a week Smoker:  no  Health Maintenance: Pap:  02/05/13 Neg. 02/05/13 Neg. HR HPV:neg History of abnormal Pap:  no MMG:  08/14/14 [place] Left BIRADS1:neg Colonoscopy:  07/12/12 Polyps repeat 10 years  BMD:   Never TDaP:  01/2014  Zostavax: age 88 Screening Labs: PCP, Urine today: pending   reports that she quit smoking about 3 years ago. Her smoking use included Cigarettes. She has never used smokeless tobacco. She reports that she drinks about 1.2 oz of alcohol per week. She reports that she does not use illicit drugs.  Past Medical History  Diagnosis Date  . GERD (gastroesophageal reflux disease)   . IBS (irritable bowel syndrome)   . Migraines   . HSV-2 (herpes simplex virus 2) infection   . Anal fissure   . Anxiety   . Interstitial cystitis   . Nevus     vulva nevus  . Allergy   . Prediabetes   . Hyperlipidemia   . Fibroids     Uterine    Past Surgical History  Procedure Laterality Date  . Cholecystectomy    . Tubal reconstruction    . Bladder repair    . Tonsillectomy    . Vaginal delivery      x2  . 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 Tubalplasty  1985/ 1990   . Bunionectomy      Current Outpatient Prescriptions  Medication Sig Dispense Refill  . ALPRAZolam (XANAX) 0.25 MG tablet Take 1 tablet (0.25 mg total) by mouth 3 (three) times daily as needed for anxiety. 90 tablet 0  . aspirin EC 81 MG tablet Take 1 tablet by mouth. Every other day    . Cholecalciferol (VITAMIN D PO) Take 400 Int'l Units by mouth daily.    Marland Kitchen esomeprazole (NEXIUM) 40 MG capsule Take 1 capsule (40 mg  total) by mouth daily. 30 capsule 0  . estradiol (ESTRACE) 0.5 MG tablet TAKE 1 TABLET BY MOUTH EVERY DAY 90 tablet 0  . medroxyPROGESTERone (PROVERA) 2.5 MG tablet TAKE 2 TABLETS (5 MG TOTAL) BY MOUTH DAILY. 90 tablet 0  . oxybutynin (DITROPAN-XL) 10 MG 24 hr tablet daily.    . polyethylene glycol powder (GLYCOLAX/MIRALAX) powder MIX 17 GRAMS AS DIRECTED TO TAKE BY MOUTH TWICE A DAY AS DIRECTED 255 g 1  . rosuvastatin (CRESTOR) 20 MG tablet Take 1 tablet (20 mg total) by mouth daily. 30 tablet 3  . valACYclovir (VALTREX) 1000 MG tablet Take 1 tablet by mouth  daily 90 tablet 3   No current facility-administered medications for this visit.    Family History  Problem Relation Age of Onset  . Celiac disease Mother   . Heart disease Father   . Kidney disease      tumor  . Irritable bowel syndrome Sister   . Thyroid disease Sister   . Pancreatic cancer Maternal Grandmother   . Lung cancer Maternal Grandmother   . Diabetes Paternal Grandmother   . Breast cancer Paternal Grandmother   .  Colon cancer Neg Hx     ROS:  Pertinent items are noted in HPI.  Otherwise, a comprehensive ROS was negative.  Exam:   BP 102/60 mmHg  Pulse 66  Resp 16  Ht 5\' 3"  (1.6 m)  Wt 122 lb (55.339 kg)  BMI 21.62 kg/m2  LMP 01/20/2011  Weight change: -2#   Height: 5\' 3"  (160 cm)  Ht Readings from Last 3 Encounters:  04/24/15 5\' 3"  (1.6 m)  02/07/15 5' 3.5" (1.613 m)  08/06/14 5' 3.5" (1.613 m)    General appearance: alert, cooperative and appears stated age Head: Normocephalic, without obvious abnormality, atraumatic Neck: no adenopathy, supple, symmetrical, trachea midline and thyroid normal to inspection and palpation Lungs: clear to auscultation bilaterally Breasts: normal appearance, no masses or tenderness Heart: regular rate and rhythm Abdomen: soft, non-tender; bowel sounds normal; no masses,  no organomegaly Extremities: extremities normal, atraumatic, no cyanosis or edema Skin: Skin color,  texture, turgor normal. No rashes or lesions Lymph nodes: Cervical, supraclavicular, and axillary nodes normal. No abnormal inguinal nodes palpated Neurologic: Grossly normal   Pelvic: External genitalia:  no lesions              Urethra:  normal appearing urethra with no masses, tenderness or lesions              Bartholins and Skenes: normal                 Vagina: normal appearing vagina with normal color and discharge, no lesions              Cervix: no lesions              Pap taken: Yes.   Bimanual Exam:  Uterus:  normal size, contour, position, consistency, mobility, non-tender              Adnexa: normal adnexa and no mass, fullness, tenderness               Rectovaginal: Confirms               Anus:  normal sphincter tone, no lesions  Chaperone was present for exam.  A:  Well Woman with normal exam PMP, on HRT. Pt has tried to decrease dosage but is still on 0.5mg  dosage.   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 H/O I.C., OAB, microscopic hematuria.  Has seen Dr. Amalia Hailey for evaluation.  Was released this past year unless has new issues. RLQ pain.  Ultrasound last year was normal.    P: Mammogram yearly.  Pap last year. HR HPV 2012.  Pap and HR HPV today. No prescriptions for HRT done today.  Pt is not completely sure of dosage being taken. Pt will check and call to let me know.  (I think she is taking estradiol 0.5mg  and provera 2.5mg ).  She has tried to decrease dosage last year and was unsuccessful.  She is aware of risks including DVT/PE/stroke/breast cancer Valtrex 1 gm daily.  #90/4RF Oxybutynin 10mg  daily.  #90/4RF.   Hep C antibody obtained today. Trial of premarin vaginal cream 1/2 grm twice weekly.  Recheck 3 months. Return annually or prn

## 2015-04-25 LAB — HEPATITIS C ANTIBODY: HCV Ab: NEGATIVE

## 2015-04-29 LAB — IPS PAP TEST WITH HPV

## 2015-05-03 ENCOUNTER — Ambulatory Visit (INDEPENDENT_AMBULATORY_CARE_PROVIDER_SITE_OTHER): Payer: 59 | Admitting: Internal Medicine

## 2015-05-03 ENCOUNTER — Ambulatory Visit (INDEPENDENT_AMBULATORY_CARE_PROVIDER_SITE_OTHER): Payer: 59

## 2015-05-03 VITALS — BP 114/66 | HR 59 | Temp 98.1°F | Resp 16 | Ht 64.0 in | Wt 124.0 lb

## 2015-05-03 DIAGNOSIS — M25532 Pain in left wrist: Secondary | ICD-10-CM

## 2015-05-03 DIAGNOSIS — M25531 Pain in right wrist: Secondary | ICD-10-CM | POA: Diagnosis not present

## 2015-05-03 DIAGNOSIS — M659 Synovitis and tenosynovitis, unspecified: Secondary | ICD-10-CM

## 2015-05-03 MED ORDER — MELOXICAM 15 MG PO TABS: 15.0000 mg | ORAL_TABLET | Freq: Every day | ORAL | Status: DC

## 2015-05-03 NOTE — Progress Notes (Signed)
Subjective:  By signing my name below, I, [name], attest that this documentation has been prepared under the direction and in the presence of Tami Lin, MD. Electronically Signed: [name], [place]. 05/03/2015 , 11:07 AM .  Patient was seen in Room 7 .   Patient ID: Patricia Ryan, female    DOB: May 10, 1955, 61 y.o.   MRN: [id number] Chief Complaint  Patient presents with  . Hand Pain    Bilateral  . Wrist Pain    Bilateral   HPI Patricia Ryan is a 60 y.o. female who presents to [facility] complaining of bilateral hand pain with bilateral wrist pain. She initially felt more pain in right middle and ring fingers, and now some pain into the right thumb and index finger. She also noticed some numbness in her fingers after putting on her makeup in the morning. She saw Dr. Lynann Bologna and informed her finger bones were too long.   She's currently retired.   Patient Active Problem List   Diagnosis Date Noted  . 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 IBS (irritable bowel syndrome)   . Allergy   . Migraines   . Hyperlipidemia   . Fibroids   . Prediabetes   . Chronic interstitial cystitis 11/08/2012  . Interstitial cystitis 07/25/2012  . Constipation 02/06/2010  . ABDOMINAL BLOATING 02/06/2010  . Anxiety state 09/24/2008  . HX OF GALLSTONE 09/24/2008  . ANAL FISSURE, HX OF 09/24/2008    Current outpatient prescriptions:  .  ALPRAZolam (XANAX) 0.25 MG tablet, Take 1 tablet (0.25 mg total) by mouth 3 (three) times daily as needed for anxiety., Disp: 90 tablet, Rfl: 0 .  aspirin EC 81 MG tablet, Take 1 tablet by mouth. Every other day, Disp: , Rfl:  .  Cholecalciferol (VITAMIN D PO), Take 400 Int'l Units by mouth daily., Disp: , Rfl:  .  conjugated estrogens (PREMARIN) vaginal cream, 1/2 gram vaginally twice weekly, Disp: 30 g, Rfl: 2 .  estradiol (ESTRACE) 0.5 MG tablet, TAKE 1 TABLET BY MOUTH EVERY DAY, Disp: 90 tablet, Rfl: 0 .  medroxyPROGESTERone (PROVERA) 2.5 MG tablet, TAKE 2  TABLETS (5 MG TOTAL) BY MOUTH DAILY., Disp: 90 tablet, Rfl: 0 .  oxybutynin (DITROPAN-XL) 10 MG 24 hr tablet, Take 1 tablet (10 mg total) by mouth at bedtime., Disp: 90 tablet, Rfl: 4 .  polyethylene glycol powder (GLYCOLAX/MIRALAX) powder, MIX 17 GRAMS AS DIRECTED TO TAKE BY MOUTH TWICE A DAY AS DIRECTED, Disp: 255 g, Rfl: 1 .  rosuvastatin (CRESTOR) 20 MG tablet, Take 1 tablet (20 mg total) by mouth daily., Disp: 30 tablet, Rfl: 3 .  valACYclovir (VALTREX) 1000 MG tablet, Take 1 tablet by mouth  daily, Disp: 90 tablet, Rfl: 4 .  esomeprazole (NEXIUM) 40 MG capsule, Take 1 capsule (40 mg total) by mouth daily., Disp: 30 capsule, Rfl: 0  Review of Systems  Constitutional: Negative for fever, chills and fatigue.  Respiratory: Negative for cough.   Cardiovascular: Negative for chest pain.  Gastrointestinal: Negative for nausea and vomiting.  Musculoskeletal: Positive for myalgias and arthralgias. Negative for joint swelling.  Skin: Negative for rash and wound.  Neurological: Positive for numbness. Negative for weakness.       Objective:   Physical Exam  Constitutional: She is oriented to person, place, and time. She appears well-developed and well-nourished. No distress.  HENT:  Head: Normocephalic and atraumatic.  Eyes: EOM are normal. Pupils are equal, round, and reactive to light.  Neck: Neck supple.  Cardiovascular: Normal  rate.   Pulmonary/Chest: Effort normal. No respiratory distress.  Musculoskeletal: Normal range of motion.  Tender at the first [facility] bilaterally, right > left, with slight swelling with no redness, opposed thumb movements are painful, extensor tendon is non tender, negative tinel's  Neurological: She is alert and oriented to person, place, and time.  Skin: Skin is warm and dry.  Psychiatric: She has a normal mood and affect. Her behavior is normal.  Nursing note and vitals reviewed.   BP 114/66 mmHg  Pulse 59  Temp(Src) 98.1 F (36.7 C) (Oral)  Resp 16  Ht  5\' 4"  (1.626 m)  Wt 124 lb (56.246 kg)  BMI 21.27 kg/m2  SpO2 99%  LMP 01/20/2011   [facility] reading (PRIMARY) by Dr. Laney Pastor : wrist xray: negative      Assessment & Plan:  I have completed the patient encounter in its entirety as documented by the scribe, with editing by me where necessary. Horace Lukas P. Laney Pastor, M.D.  Pain in right wrist - Plan: DG Wrist 2 Views Right, Ambulatory referral to Orthopedic Surgery  [facility] (carpometacarpal) synovitis - Plan: Ambulatory referral to Orthopedic Surgery  Pain in left wrist - Plan: Ambulatory referral to Orthopedic Surgery  Begin morning range of motion in  hot water and evening ice Meds ordered this encounter  Medications  . meloxicam (MOBIC) 15 MG tablet    Sig: Take 1 tablet (15 mg total) by mouth daily.    Dispense:  30 tablet    Refill:  0   Dr Fredna Dow to eval!

## 2015-05-03 NOTE — Patient Instructions (Signed)
     IF you received an x-ray today, you will receive an invoice from [facility]. Please contact  Radiology at [phone] with questions or concerns regarding your invoice.   IF you received labwork today, you will receive an invoice from [facility]. Please contact [facility] at [phone] with questions or concerns regarding your invoice.   Our billing staff will not be able to assist you with questions regarding bills from these companies.  You will be contacted with the lab results as soon as they are available. The fastest way to get your results is to activate your My Chart account. Instructions are located on the last page of this paperwork. If you have not heard from us regarding the results in 2 weeks, please contact this office.      

## 2015-05-04 NOTE — Addendum Note (Signed)
Addended by: Megan Salon on: 05/04/2015 09:37 PM   Modules accepted: Orders, SmartSet

## 2015-05-06 LAB — IPS HPV GENOTYPING 16/18

## 2015-05-11 ENCOUNTER — Other Ambulatory Visit: Payer: Self-pay | Admitting: Internal Medicine

## 2015-07-11 ENCOUNTER — Other Ambulatory Visit: Payer: Self-pay | Admitting: Internal Medicine

## 2015-07-11 DIAGNOSIS — R300 Dysuria: Secondary | ICD-10-CM

## 2015-07-11 MED ORDER — CIPROFLOXACIN HCL 500 MG PO TABS: ORAL_TABLET | ORAL | Status: AC

## 2015-07-24 ENCOUNTER — Encounter: Payer: Self-pay | Admitting: Obstetrics & Gynecology

## 2015-07-24 ENCOUNTER — Ambulatory Visit (INDEPENDENT_AMBULATORY_CARE_PROVIDER_SITE_OTHER): Payer: 59 | Admitting: Obstetrics & Gynecology

## 2015-07-24 VITALS — BP 110/60 | HR 68 | Resp 16 | Wt 121.0 lb

## 2015-07-24 DIAGNOSIS — N3090 Cystitis, unspecified without hematuria: Secondary | ICD-10-CM | POA: Diagnosis not present

## 2015-07-24 DIAGNOSIS — B977 Papillomavirus as the cause of diseases classified elsewhere: Secondary | ICD-10-CM | POA: Diagnosis not present

## 2015-07-24 DIAGNOSIS — N952 Postmenopausal atrophic vaginitis: Secondary | ICD-10-CM

## 2015-07-24 DIAGNOSIS — N9089 Other specified noninflammatory disorders of vulva and perineum: Secondary | ICD-10-CM

## 2015-07-24 NOTE — Progress Notes (Signed)
GYNECOLOGY  VISIT   HPI: 60 y.o. G2P2 Married Caucasian female for follow-up after starting vaginal estrogen for vaginal discomfort with intercourse.  Reports symptoms have completely resolved.  D/w pt dosing--she can decrease to see how frequently she needs to use this.  Pt also has some questions about her pap and HPV testing.  Pap neg but HR HPV +.  16/18/45 testing negative.  Guidelines discussed.  All questions answered.  Pt comfortable with follow up pap and HR HPV next year.  Pt's spouse continues to have condyloma issues. Would like to have vulvar exam today as well.  Has not noted any new lesions.  Lastly, pt reports having a UTI about two weeks ago that was treated over the phone by PCP.  She wanted to have urine tested today.  She could only give a tiny sample.  Patient's last menstrual period was 01/20/2011.     Past Medical History  Diagnosis Date  . GERD (gastroesophageal reflux disease)   . IBS (irritable bowel syndrome)   . Migraines   . HSV-2 (herpes simplex virus 2) infection   . Anal fissure   . Anxiety   . Interstitial cystitis   . Nevus     vulva nevus  . Allergy   . Prediabetes   . Hyperlipidemia   . Fibroids     Uterine    Past Surgical History  Procedure Laterality Date  . Cholecystectomy    . Tubal reconstruction    . Bladder repair    . Tonsillectomy    . Vaginal delivery      x2  . 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 Tubalplasty  1985/ 1990   . Bunionectomy      MEDS:  Reviewed in EPIC and UTD  ALLERGIES: Sulfa antibiotics and Macrobid  Family History  Problem Relation Age of Onset  . Celiac disease Mother   . Heart disease Father   . Kidney disease      tumor  . Irritable bowel syndrome Sister   . Thyroid disease Sister   . Pancreatic cancer Maternal Grandmother   . Lung cancer Maternal Grandmother   . Diabetes Paternal Grandmother   . Breast cancer Paternal Grandmother   . Colon cancer Neg Hx     SH:  Married,  non smoker  Review of Systems  All other systems reviewed and are negative.   PHYSICAL EXAMINATION:    BP 110/60 mmHg  Pulse 68  Resp 16  Wt 121 lb (54.885 kg)  LMP 01/20/2011    General appearance: alert, cooperative and appears stated age  Pelvic: External genitalia: small raised, condylomatous appearing lesion on left of introitus.  Biopsy recommended.              Urethra:  normal appearing urethra with no masses, tenderness or lesions              Bartholins and Skenes: normal                 Vagina: normal appearing vagina with normal color and discharge, no lesions              Cervix: no lesions              Bimanual Exam:  Uterus:  normal size, contour, position, consistency, mobility, non-tender              Adnexa: no mass, fullness, tenderness  Vulvar biopsy recommended.  Pt gave verbal consent.  Area cleansed with Betadine x 3.  1.0% lidocaine instilled beneath lesions, 0.7cc.  No epinephrine used in lidocaine.  Area fully excised with scissors and pickups in sterile technique.  Silver nitrate applied for excellent hemostasis.  Pt tolerated procedure well.  Chaperone was present for exam.  Assessment: Vaginal atrophic changes improved with vaginal estrogen +HR HPV Vulvar lesion Recent cystitis  Plan: Biopsy pending.  results will be called to pt. She will continue vaginal estrogen 1-2 times a week as needed. Urine culture obtained for TOC.  Only enough urine is present for this.

## 2015-07-25 LAB — URINE CULTURE
COLONY COUNT: NO GROWTH
ORGANISM ID, BACTERIA: NO GROWTH

## 2015-08-07 ENCOUNTER — Ambulatory Visit: Payer: Self-pay | Admitting: Physician Assistant

## 2015-08-23 ENCOUNTER — Other Ambulatory Visit: Payer: Self-pay | Admitting: Obstetrics & Gynecology

## 2015-08-25 NOTE — Telephone Encounter (Signed)
Medication refill request: Estradiol Last AEX:  04/24/15 SM Next AEX: 08/12/16 SM Last MMG (if hormonal medication request): 08/14/14 Dx BIRADS0; 08/14/14 U/S L Breast BIRADS1 Refill authorized: 03/24/15 #[id number]. Please advise. Thank you.

## 2015-08-25 NOTE — Telephone Encounter (Signed)
Medication refill request: Estradiol  Last AEX:  04-24-15 Next AEX: 08-12-16 Last MMG (if hormonal medication request): 08-14-14 U/S NEG   ([place])   Refill authorized: please advise

## 2015-08-26 ENCOUNTER — Ambulatory Visit (INDEPENDENT_AMBULATORY_CARE_PROVIDER_SITE_OTHER): Payer: 59 | Admitting: Physician Assistant

## 2015-08-26 ENCOUNTER — Encounter: Payer: Self-pay | Admitting: Physician Assistant

## 2015-08-26 VITALS — BP 112/70 | HR 60 | Temp 97.7°F | Resp 16 | Ht 64.0 in | Wt 122.0 lb

## 2015-08-26 DIAGNOSIS — E559 Vitamin D deficiency, unspecified: Secondary | ICD-10-CM

## 2015-08-26 DIAGNOSIS — E785 Hyperlipidemia, unspecified: Secondary | ICD-10-CM

## 2015-08-26 DIAGNOSIS — I10 Essential (primary) hypertension: Secondary | ICD-10-CM

## 2015-08-26 DIAGNOSIS — Z79899 Other long term (current) drug therapy: Secondary | ICD-10-CM

## 2015-08-26 DIAGNOSIS — R0989 Other specified symptoms and signs involving the circulatory and respiratory systems: Secondary | ICD-10-CM

## 2015-08-26 DIAGNOSIS — R7303 Prediabetes: Secondary | ICD-10-CM

## 2015-08-26 LAB — BASIC METABOLIC PANEL WITH GFR
BUN: 12 mg/dL (ref 7–25)
CO2: 23 mmol/L (ref 20–31)
Calcium: 9.7 mg/dL (ref 8.6–10.4)
Chloride: 107 mmol/L (ref 98–110)
Creat: 0.68 mg/dL (ref 0.50–1.05)
GFR, Est African American: >89 mL/min (ref >=60)
GLUCOSE: 102 mg/dL — AB (ref 65–99)
POTASSIUM: 4.7 mmol/L (ref 3.5–5.3)
Sodium: 142 mmol/L (ref 135–146)

## 2015-08-26 LAB — LIPID PANEL
Cholesterol: 186 mg/dL (ref 125–200)
HDL: 62 mg/dL (ref >=46)
LDL CALC: 104 mg/dL (ref <130)
TRIGLYCERIDES: 98 mg/dL (ref <150)
Total CHOL/HDL Ratio: 3 Ratio (ref <=5.0)
VLDL: 20 mg/dL (ref <30)

## 2015-08-26 LAB — HEPATIC FUNCTION PANEL
ALK PHOS: 58 U/L (ref 33–130)
ALT: 15 U/L (ref 6–29)
AST: 18 U/L (ref 10–35)
Albumin: 4.6 g/dL (ref 3.6–5.1)
BILIRUBIN DIRECT: 0.1 mg/dL (ref <=0.2)
BILIRUBIN INDIRECT: 0.3 mg/dL (ref 0.2–1.2)
TOTAL PROTEIN: 6.9 g/dL (ref 6.1–8.1)
Total Bilirubin: 0.4 mg/dL (ref 0.2–1.2)

## 2015-08-26 LAB — CBC WITH DIFFERENTIAL/PLATELET
BASOS PCT: 1 %
Basophils Absolute: 42 cells/uL (ref 0–200)
EOS ABS: 126 {cells}/uL (ref 15–500)
Eosinophils Relative: 3 %
HEMATOCRIT: 42.1 % (ref 35.0–45.0)
Hemoglobin: 13.9 g/dL (ref 11.7–15.5)
LYMPHS PCT: 30 %
Lymphs Abs: 1260 cells/uL (ref 850–3900)
MCH: 30.3 pg (ref 27.0–33.0)
MCHC: 33 g/dL (ref 32.0–36.0)
MCV: 91.7 fL (ref 80.0–100.0)
MONO ABS: 504 {cells}/uL (ref 200–950)
MPV: 9.6 fL (ref 7.5–12.5)
Monocytes Relative: 12 %
NEUTROS ABS: 2268 {cells}/uL (ref 1500–7800)
Neutrophils Relative %: 54 %
PLATELETS: 212 10*3/uL (ref 140–400)
RBC: 4.59 MIL/uL (ref 3.80–5.10)
RDW: 13.7 % (ref 11.0–15.0)
WBC: 4.2 10*3/uL (ref 3.8–10.8)

## 2015-08-26 LAB — TSH: TSH: 0.98 mIU/L

## 2015-08-26 LAB — HEMOGLOBIN A1C
HEMOGLOBIN A1C: 5.8 % — AB (ref <5.7)
Mean Plasma Glucose: 120 mg/dL

## 2015-08-26 LAB — MAGNESIUM: MAGNESIUM: 2.1 mg/dL (ref 1.5–2.5)

## 2015-08-26 NOTE — Progress Notes (Signed)
Assessment and Plan:  Hypertension: Continue medication, monitor blood pressure at home. Continue DASH diet.  Reminder to go to the ER if any CP, SOB, nausea, dizziness, severe HA, changes vision/speech, left arm numbness and tingling, and jaw pain. Cholesterol: Continue diet and exercise. Check cholesterol.  Pre-diabetes-Continue diet and exercise. Check A1C Vitamin D Def- check level and continue medications.  GERD- still on nexium   Continue diet and meds as discussed. Further disposition pending results of labs. Future Appointments Date Time Provider [place]  02/09/2016 9:00 AM Starlyn Skeans, PA-C [facility] None  08/12/2016 10:30 AM Megan Salon, MD [place] None    HPI 60 y.o. female  presents for 3 month follow up with hypertension, hyperlipidemia, prediabetes and vitamin D. Her blood pressure has been controlled at home, today their BP is   She does workout. She denies chest pain, shortness of breath, dizziness.  She is not on cholesterol medication, she was on crestor on 10mg  daily and denies myalgias. Her cholesterol is at goal. The cholesterol last visit was:   Lab Results  Component Value Date   CHOL 180 02/07/2015   HDL 61 02/07/2015   LDLCALC 102 02/07/2015   TRIG 86 02/07/2015   CHOLHDL 3.0 02/07/2015   She has been working on diet and exercise for prediabetes, and denies paresthesia of the feet, polydipsia, polyuria and visual disturbances. Last A1C in the office was:  Lab Results  Component Value Date   HGBA1C 5.8 (H) 02/07/2015   Patient is on Vitamin D supplement.   Lab Results  Component Value Date   VD25OH 48 02/07/2015     She is on estrogen and progesterone for hot flashes, she is not on bASA, low risk factors for DVT, have decreased and doing well.  Has trouble sleeping, will wake up often   Current Medications:  Current Outpatient Prescriptions on File Prior to Visit  Medication Sig Dispense Refill  . ALPRAZolam (XANAX) 0.25 MG tablet  Take 1 tablet (0.25 mg total) by mouth 3 (three) times daily as needed for anxiety. 90 tablet 0  . aspirin EC 81 MG tablet Take 1 tablet by mouth. Every other day    . Cholecalciferol (VITAMIN D PO) Take 400 Int'l Units by mouth daily.    Marland Kitchen conjugated estrogens (PREMARIN) vaginal cream 1/2 gram vaginally twice weekly 30 g 2  . estradiol (ESTRACE) 0.5 MG tablet TAKE 1 TABLET BY MOUTH EVERY DAY (Patient taking differently: TAKE 1/2 TABLET BY MOUTH EVERY DAY) 90 tablet 0  . medroxyPROGESTERone (PROVERA) 2.5 MG tablet TAKE 2 TABLETS (5 MG TOTAL) BY MOUTH DAILY. (Patient taking differently: TAKE 1/2 TABLETS BY MOUTH DAILY.) 90 tablet 0  . oxybutynin (DITROPAN-XL) 10 MG 24 hr tablet Take 1 tablet (10 mg total) by mouth at bedtime. 90 tablet 4  . polyethylene glycol powder (GLYCOLAX/MIRALAX) powder MIX 17 GRAMS AS DIRECTED TO TAKE BY MOUTH TWICE A DAY AS DIRECTED 255 g 3  . rosuvastatin (CRESTOR) 20 MG tablet Take 1 tablet (20 mg total) by mouth daily. 30 tablet 3  . valACYclovir (VALTREX) 1000 MG tablet Take 1 tablet by mouth  daily 90 tablet 4  . esomeprazole (NEXIUM) 40 MG capsule Take 1 capsule (40 mg total) by mouth daily. 30 capsule 0   No current facility-administered medications on file prior to visit.    Medical History:  Past Medical History:  Diagnosis Date  . Allergy   . Anal fissure   . Anxiety   . Fibroids  Uterine  . GERD (gastroesophageal reflux disease)   . HSV-2 (herpes simplex virus 2) infection   . Hyperlipidemia   . IBS (irritable bowel syndrome)   . Interstitial cystitis   . Migraines   . Nevus    vulva nevus  . Prediabetes    Allergies:  Allergies  Allergen Reactions  . Sulfa Antibiotics Hives, Shortness Of Breath and Rash  . Macrobid [Nitrofurantoin Macrocrystal] Nausea Only   Review of Systems  Constitutional: Negative.   HENT: Negative.   Eyes: Negative.   Respiratory: Negative.   Cardiovascular: Negative.   Gastrointestinal: Negative for abdominal  pain, blood in stool, constipation, diarrhea, heartburn, melena, nausea and vomiting.  Genitourinary: Negative.        Nocturia x 2  Musculoskeletal: Positive for myalgias (started back at the gym). Negative for back pain, falls, joint pain and neck pain.  Skin: Negative.   Neurological: Negative for dizziness, tingling, tremors, sensory change, speech change, focal weakness, seizures and loss of consciousness.  Psychiatric/Behavioral: The patient has insomnia.     Family history- Review and unchanged Social history- Review and unchanged Physical Exam: LMP 01/20/2011  Wt Readings from Last 3 Encounters:  07/24/15 121 lb (54.9 kg)  05/03/15 124 lb (56.2 kg)  04/24/15 122 lb (55.3 kg)   General Appearance: Well nourished, in no apparent distress. Eyes: PERRLA, EOMs, conjunctiva no swelling or erythema Sinuses: No Frontal/maxillary tenderness ENT/Mouth: Ext aud canals clear, TMs without erythema, bulging. No erythema, swelling, or exudate on post pharynx.  Tonsils not swollen or erythematous. Hearing normal.  Neck: Supple, thyroid normal.  Respiratory: Respiratory effort normal, BS equal bilaterally without rales, rhonchi, wheezing or stridor.  Cardio: RRR with no MRGs. Brisk peripheral pulses without edema.  Abdomen: Soft, + BS, nontender, no guarding, rebound, hernias, masses. Lymphatics: Non tender without lymphadenopathy.  Musculoskeletal: Full ROM, 5/5 strength, normal gait.  Skin: Warm, dry without rashes, lesions, ecchymosis.  Neuro: Cranial nerves intact. Normal muscle tone, no cerebellar symptoms. Sensation intact.  Psych: Awake and oriented X 3, normal affect, Insight and Judgment appropriate.    Vicie Mutters, PA-C 9:12 AM [facility]

## 2015-08-27 LAB — VITAMIN D 25 HYDROXY (VIT D DEFICIENCY, FRACTURES): VIT D 25 HYDROXY: 61 ng/mL (ref 30–100)

## 2015-09-09 ENCOUNTER — Encounter: Payer: Self-pay | Admitting: Obstetrics & Gynecology

## 2015-09-24 ENCOUNTER — Other Ambulatory Visit: Payer: Self-pay | Admitting: Obstetrics & Gynecology

## 2015-09-24 NOTE — Telephone Encounter (Signed)
Medication refill request: Provera  Last AEX:  04-14-15 Next AEX: 08-12-16 Last MMG (if hormonal medication request): 08-29-15 [facility] Refill authorized: please advise

## 2015-10-05 ENCOUNTER — Other Ambulatory Visit: Payer: Self-pay | Admitting: Physician Assistant

## 2016-01-05 ENCOUNTER — Other Ambulatory Visit: Payer: 59

## 2016-01-05 ENCOUNTER — Other Ambulatory Visit: Payer: Self-pay | Admitting: Internal Medicine

## 2016-01-05 DIAGNOSIS — N3000 Acute cystitis without hematuria: Secondary | ICD-10-CM

## 2016-01-05 MED ORDER — CIPROFLOXACIN HCL 500 MG PO TABS: 500.0000 mg | ORAL_TABLET | Freq: Two times a day (BID) | ORAL | 0 refills | Status: DC

## 2016-01-06 LAB — URINALYSIS, ROUTINE W REFLEX MICROSCOPIC
Bilirubin Urine: NEGATIVE
Glucose, UA: NEGATIVE
NITRITE: POSITIVE — AB
SPECIFIC GRAVITY, URINE: 1.021 (ref 1.001–1.035)
pH: 5 (ref 5.0–8.0)

## 2016-01-06 LAB — URINALYSIS, MICROSCOPIC ONLY
Casts: NONE SEEN [LPF]
Crystals: NONE SEEN [HPF]
SQUAMOUS EPITHELIAL / LPF: NONE SEEN [HPF] (ref <=5)
WBC, UA: >60 WBC/HPF — AB (ref <=5)
YEAST: NONE SEEN [HPF]

## 2016-01-07 ENCOUNTER — Other Ambulatory Visit: Payer: Self-pay | Admitting: Internal Medicine

## 2016-01-07 DIAGNOSIS — N3000 Acute cystitis without hematuria: Secondary | ICD-10-CM

## 2016-01-07 LAB — URINE CULTURE

## 2016-01-07 MED ORDER — CEFUROXIME AXETIL 250 MG PO TABS: 250.0000 mg | ORAL_TABLET | Freq: Two times a day (BID) | ORAL | 0 refills | Status: AC

## 2016-01-09 ENCOUNTER — Ambulatory Visit (INDEPENDENT_AMBULATORY_CARE_PROVIDER_SITE_OTHER): Payer: 59 | Admitting: Family Medicine

## 2016-01-09 VITALS — BP 122/72 | HR 71 | Temp 98.5°F | Resp 17 | Ht 64.5 in | Wt 123.0 lb

## 2016-01-09 DIAGNOSIS — J069 Acute upper respiratory infection, unspecified: Secondary | ICD-10-CM

## 2016-01-09 DIAGNOSIS — J029 Acute pharyngitis, unspecified: Secondary | ICD-10-CM | POA: Diagnosis not present

## 2016-01-09 LAB — POCT RAPID STREP A (OFFICE): RAPID STREP A SCREEN: NEGATIVE

## 2016-01-09 NOTE — Patient Instructions (Addendum)
Unfortunately your symptoms still appear to be due to a virus. Even if this were strep throat, the current antibiotic you're taking should usually cover that. I will check a strep test then culture if needed.  Saline nasal spray atleast 4 times per day if needed for nasal congestion, over the counter mucinex or mucinex DM for cough, cepacol or other lozenge if needed, drink plenty of fluids.    Return to the clinic or go to the nearest emergency room if any of your symptoms worsen or new symptoms occur.    Upper Respiratory Infection, Adult Most upper respiratory infections (URIs) are a viral infection of the air passages leading to the lungs. A URI affects the nose, throat, and upper air passages. The most common type of URI is nasopharyngitis and is typically referred to as "the common cold." URIs run their course and usually go away on their own. Most of the time, a URI does not require medical attention, but sometimes a bacterial infection in the upper airways can follow a viral infection. This is called a secondary infection. Sinus and middle ear infections are common types of secondary upper respiratory infections. Bacterial pneumonia can also complicate a URI. A URI can worsen asthma and chronic obstructive pulmonary disease (COPD). Sometimes, these complications can require emergency medical care and may be life threatening. What are the causes? Almost all URIs are caused by viruses. A virus is a type of germ and can spread from one person to another. What increases the risk? You may be at risk for a URI if:  You smoke.  You have chronic heart or lung disease.  You have a weakened defense (immune) system.  You are very young or very old.  You have nasal allergies or asthma.  You work in crowded or poorly ventilated areas.  You work in health care facilities or schools. What are the signs or symptoms? Symptoms typically develop 2-3 days after you come in contact with a cold  virus. Most viral URIs last 7-10 days. However, viral URIs from the influenza virus (flu virus) can last 14-18 days and are typically more severe. Symptoms may include:  Runny or stuffy (congested) nose.  Sneezing.  Cough.  Sore throat.  Headache.  Fatigue.  Fever.  Loss of appetite.  Pain in your forehead, behind your eyes, and over your cheekbones (sinus pain).  Muscle aches. How is this diagnosed? Your health care provider may diagnose a URI by:  Physical exam.  Tests to check that your symptoms are not due to another condition such as:  Strep throat.  Sinusitis.  Pneumonia.  Asthma. How is this treated? A URI goes away on its own with time. It cannot be cured with medicines, but medicines may be prescribed or recommended to relieve symptoms. Medicines may help:  Reduce your fever.  Reduce your cough.  Relieve nasal congestion. Follow these instructions at home:  Take medicines only as directed by your health care provider.  Gargle warm saltwater or take cough drops to comfort your throat as directed by your health care provider.  Use a warm mist humidifier or inhale steam from a shower to increase air moisture. This may make it easier to breathe.  Drink enough fluid to keep your urine clear or pale yellow.  Eat soups and other clear broths and maintain good nutrition.  Rest as needed.  Return to work when your temperature has returned to normal or as your health care provider advises. You may need to  stay home longer to avoid infecting others. You can also use a face mask and careful hand washing to prevent spread of the virus.  Increase the usage of your inhaler if you have asthma.  Do not use any tobacco products, including cigarettes, chewing tobacco, or electronic cigarettes. If you need help quitting, ask your health care provider. How is this prevented? The best way to protect yourself from getting a cold is to practice good hygiene.  Avoid  oral or hand contact with people with cold symptoms.  Wash your hands often if contact occurs. There is no clear evidence that vitamin C, vitamin E, echinacea, or exercise reduces the chance of developing a cold. However, it is always recommended to get plenty of rest, exercise, and practice good nutrition. Contact a health care provider if:  You are getting worse rather than better.  Your symptoms are not controlled by medicine.  You have chills.  You have worsening shortness of breath.  You have brown or red mucus.  You have yellow or brown nasal discharge.  You have pain in your face, especially when you bend forward.  You have a fever.  You have swollen neck glands.  You have pain while swallowing.  You have white areas in the back of your throat. Get help right away if:  You have severe or persistent:  Headache.  Ear pain.  Sinus pain.  Chest pain.  You have chronic lung disease and any of the following:  Wheezing.  Prolonged cough.  Coughing up blood.  A change in your usual mucus.  You have a stiff neck.  You have changes in your:  Vision.  Hearing.  Thinking.  Mood. This information is not intended to replace advice given to you by your health care provider. Make sure you discuss any questions you have with your health care provider. Document Released: 07/07/2000 Document Revised: 09/14/2015 Document Reviewed: 04/18/2013 Elsevier Interactive Patient Education  2017 [employer].    IF you received an x-ray today, you will receive an invoice from [facility]. Please contact [facility] at [phone] with questions or concerns regarding your invoice.   IF you received labwork today, you will receive an invoice from [facility]. Please contact [facility] at [phone] with questions or concerns regarding your invoice.   Our billing staff will not be able to assist you with questions regarding bills from these  companies.  You will be contacted with the lab results as soon as they are available. The fastest way to get your results is to activate your My Chart account. Instructions are located on the last page of this paperwork. If you have not heard from [place] regarding the results in 2 weeks, please contact this office.

## 2016-01-09 NOTE — Progress Notes (Signed)
Subjective:  By signing my name below, I, [name], attest that this documentation has been prepared under the direction and in the presence of [name], MD Electronically Signed: Ladene Artist, ED Scribe 01/09/2016 at 8:30 AM.   Patient ID: Patricia Ryan, female    DOB: Aug 28, 1955, 60 y.o.   MRN: [id number]  Chief Complaint  Patient presents with  . Sore Throat  . URI  . Cough   HPI HPI Comments: Patricia Ryan is a 60 y.o. female who presents to the [facility] complaining of gradually worsening sore throat onset 4 days ago. Pt reports associated symptoms of minimal cough, bilateral ear pain, postnasal drip, congestion. She has tried Flonase and Chloraseptic spray without significant relief. Pt denies fever. She reports sick contacts diagnosed with viral illnesses. Pt states that she is currently on Ceftin for a UTI.   Patient Active Problem List   Diagnosis Date Noted  . 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 IBS (irritable bowel syndrome)   . Allergy   . Migraines   . Hyperlipidemia   . Fibroids   . Prediabetes   . Chronic interstitial cystitis 11/08/2012  . Interstitial cystitis 07/25/2012  . Constipation 02/06/2010  . ABDOMINAL BLOATING 02/06/2010  . Anxiety state 09/24/2008  . HX OF GALLSTONE 09/24/2008  . ANAL FISSURE, HX OF 09/24/2008   Past Medical History:  Diagnosis Date  . Allergy   . Anal fissure   . Anxiety   . Fibroids    Uterine  . GERD (gastroesophageal reflux disease)   . HSV-2 (herpes simplex virus 2) infection   . Hyperlipidemia   . IBS (irritable bowel syndrome)   . Interstitial cystitis   . Migraines   . Nevus    vulva nevus  . Prediabetes    Past Surgical History:  Procedure Laterality Date  . BLADDER REPAIR    . BUNIONECTOMY    . CHOLECYSTECTOMY    . LAPAROSCOPY  1985/ 1990   due to infertility  . TONSILLECTOMY    . tubal reconstruction    . tubalplasty  1985/ 1990   . VAGINAL DELIVERY     x2    Allergies  Allergen Reactions  . Sulfa Antibiotics Hives, Shortness Of Breath and Rash  . Macrobid [Nitrofurantoin Macrocrystal] Nausea Only   Prior to Admission medications   Medication Sig Start Date End Date Taking? Authorizing Provider  ALPRAZolam (XANAX) 0.25 MG tablet Take 1 tablet (0.25 mg total) by mouth 3 (three) times daily as needed for anxiety. 02/07/15  Yes Courtney Forcucci, PA-C  aspirin EC 81 MG tablet Take 1 tablet by mouth. Every other day   Yes Historical Provider, MD  cefUROXime (CEFTIN) 250 MG tablet Take 1 tablet (250 mg total) by mouth 2 (two) times daily. 01/07/16 01/17/16 Yes Unk Pinto, MD  Cholecalciferol (VITAMIN D PO) Take 400 Int'l Units by mouth daily.   Yes Historical Provider, MD  conjugated estrogens (PREMARIN) vaginal cream 1/2 gram vaginally twice weekly 04/24/15  Yes Megan Salon, MD  estradiol (ESTRACE) 0.5 MG tablet TAKE 1 TABLET BY MOUTH EVERY DAY Patient taking differently: TAKE 1/2 TABLET BY MOUTH EVERY DAY 08/25/15  Yes Megan Salon, MD  medroxyPROGESTERone (PROVERA) 2.5 MG tablet TAKE 2 TABLETS (5 MG TOTAL) BY MOUTH DAILY. 09/25/15  Yes Megan Salon, MD  oxybutynin (DITROPAN-XL) 10 MG 24 hr tablet Take 1 tablet (10 mg total) by mouth at bedtime. 04/24/15  Yes Megan Salon, MD  polyethylene glycol powder (GLYCOLAX/MIRALAX) powder MIX 17 GRAMS AS DIRECTED TO TAKE BY MOUTH TWICE A DAY AS DIRECTED 05/11/15  Yes Unk Pinto, MD  rosuvastatin (CRESTOR) 20 MG tablet TAKE 1 TABLET (20 MG TOTAL) BY MOUTH DAILY. 10/06/15  Yes Vicie Mutters, PA-C  valACYclovir (VALTREX) 1000 MG tablet Take 1 tablet by mouth  daily 04/24/15  Yes Megan Salon, MD   Social History   Social History  . Marital status: Married    Spouse name: N/A  . Number of children: N/A  . Years of education: N/A   Occupational History  . Not on file.   Social History Main Topics  . Smoking status: Former Smoker    Types: Cigarettes    Quit date: 12/26/2011  . Smokeless  tobacco: Never Used  . Alcohol use 1.2 oz/week    2 Standard drinks or equivalent per week     Comment: weekends mostly  . Drug use: No  . Sexual activity: Yes    Partners: Male    Birth control/ protection: Post-menopausal   Other Topics Concern  . Not on file   Social History Narrative  . No narrative on file   Review of Systems  Constitutional: Negative for fever.  HENT: Positive for congestion, ear pain, postnasal drip and sore throat.   Respiratory: Positive for cough.       Objective:   Physical Exam  Constitutional: She is oriented to person, place, and time. She appears well-developed and well-nourished. No distress.  HENT:  Head: Normocephalic and atraumatic.  Right Ear: Hearing, tympanic membrane, external ear and ear canal normal.  Left Ear: Hearing, tympanic membrane, external ear and ear canal normal.  Nose: Nose normal.  Mouth/Throat: Oropharynx is clear and moist. No oropharyngeal exudate. Posterior oropharyngeal erythema: no significant   Eyes: Conjunctivae and EOM are normal. Pupils are equal, round, and reactive to light.  Cardiovascular: Normal rate, regular rhythm, normal heart sounds and intact distal pulses.   No murmur heard. Pulmonary/Chest: Effort normal and breath sounds normal. No respiratory distress. She has no wheezes. She has no rhonchi.  Lymphadenopathy:    She has no cervical adenopathy.  Neurological: She is alert and oriented to person, place, and time.  Skin: Skin is warm and dry. No rash noted.  Psychiatric: She has a normal mood and affect. Her behavior is normal.  Vitals reviewed.  Vitals:   01/09/16 0827  BP: 122/72  Pulse: 71  Resp: 17  Temp: 98.5 F (36.9 C)  TempSrc: Oral  SpO2: 97%  Weight: 123 lb (55.8 kg)  Height: 5' 4.5" (1.638 m)   Results for orders placed or performed in visit on 01/09/16  POCT rapid strep A  Result Value Ref Range   Rapid Strep A Screen Negative Negative       Assessment & Plan:   [name] is a 59 y.o. female Sore throat - Plan: POCT rapid strep A, Culture, Group A Strep  Acute upper respiratory infection  Suspected viral infection, symptomatic care discussed, check throat culture, but unlikely strep, and already on antibiotic that should cover. RTC precautions  No orders of the defined types were placed in this encounter.  Patient Instructions    Unfortunately your symptoms still appear to be due to a virus. Even if this were strep throat, the current antibiotic you're taking should usually cover that. I will check a strep test then culture if needed.  Saline nasal spray atleast 4 times per day if  needed for nasal congestion, over the counter mucinex or mucinex DM for cough, cepacol or other lozenge if needed, drink plenty of fluids.    Return to the clinic or go to the nearest emergency room if any of your symptoms worsen or new symptoms occur.    Upper Respiratory Infection, Adult Most upper respiratory infections (URIs) are a viral infection of the air passages leading to the lungs. A URI affects the nose, throat, and upper air passages. The most common type of URI is nasopharyngitis and is typically referred to as "the common cold." URIs run their course and usually go away on their own. Most of the time, a URI does not require medical attention, but sometimes a bacterial infection in the upper airways can follow a viral infection. This is called a secondary infection. Sinus and middle ear infections are common types of secondary upper respiratory infections. Bacterial pneumonia can also complicate a URI. A URI can worsen asthma and chronic obstructive pulmonary disease (COPD). Sometimes, these complications can require emergency medical care and may be life threatening. What are the causes? Almost all URIs are caused by viruses. A virus is a type of germ and can spread from one person to another. What increases the risk? You may be at risk for a URI if:  You  smoke.  You have chronic heart or lung disease.  You have a weakened defense (immune) system.  You are very young or very old.  You have nasal allergies or asthma.  You work in crowded or poorly ventilated areas.  You work in health care facilities or schools. What are the signs or symptoms? Symptoms typically develop 2-3 days after you come in contact with a cold virus. Most viral URIs last 7-10 days. However, viral URIs from the influenza virus (flu virus) can last 14-18 days and are typically more severe. Symptoms may include:  Runny or stuffy (congested) nose.  Sneezing.  Cough.  Sore throat.  Headache.  Fatigue.  Fever.  Loss of appetite.  Pain in your forehead, behind your eyes, and over your cheekbones (sinus pain).  Muscle aches. How is this diagnosed? Your health care provider may diagnose a URI by:  Physical exam.  Tests to check that your symptoms are not due to another condition such as:  Strep throat.  Sinusitis.  Pneumonia.  Asthma. How is this treated? A URI goes away on its own with time. It cannot be cured with medicines, but medicines may be prescribed or recommended to relieve symptoms. Medicines may help:  Reduce your fever.  Reduce your cough.  Relieve nasal congestion. Follow these instructions at home:  Take medicines only as directed by your health care provider.  Gargle warm saltwater or take cough drops to comfort your throat as directed by your health care provider.  Use a warm mist humidifier or inhale steam from a shower to increase air moisture. This may make it easier to breathe.  Drink enough fluid to keep your urine clear or pale yellow.  Eat soups and other clear broths and maintain good nutrition.  Rest as needed.  Return to work when your temperature has returned to normal or as your health care provider advises. You may need to stay home longer to avoid infecting others. You can also use a face mask and  careful hand washing to prevent spread of the virus.  Increase the usage of your inhaler if you have asthma.  Do not use any tobacco products, including cigarettes, chewing  tobacco, or electronic cigarettes. If you need help quitting, ask your health care provider. How is this prevented? The best way to protect yourself from getting a cold is to practice good hygiene.  Avoid oral or hand contact with people with cold symptoms.  Wash your hands often if contact occurs. There is no clear evidence that vitamin C, vitamin E, echinacea, or exercise reduces the chance of developing a cold. However, it is always recommended to get plenty of rest, exercise, and practice good nutrition. Contact a health care provider if:  You are getting worse rather than better.  Your symptoms are not controlled by medicine.  You have chills.  You have worsening shortness of breath.  You have brown or red mucus.  You have yellow or brown nasal discharge.  You have pain in your face, especially when you bend forward.  You have a fever.  You have swollen neck glands.  You have pain while swallowing.  You have white areas in the back of your throat. Get help right away if:  You have severe or persistent:  Headache.  Ear pain.  Sinus pain.  Chest pain.  You have chronic lung disease and any of the following:  Wheezing.  Prolonged cough.  Coughing up blood.  A change in your usual mucus.  You have a stiff neck.  You have changes in your:  Vision.  Hearing.  Thinking.  Mood. This information is not intended to replace advice given to you by your health care provider. Make sure you discuss any questions you have with your health care provider. Document Released: 07/07/2000 Document Revised: 09/14/2015 Document Reviewed: 04/18/2013 Elsevier Interactive Patient Education  2017 [employer].    IF you received an x-ray today, you will receive an invoice from [facility]. Please contact [facility] at [phone] with questions or concerns regarding your invoice.   IF you received labwork today, you will receive an invoice from [place]. Please contact [facility] at [phone] with questions or concerns regarding your invoice.   Our billing staff will not be able to assist you with questions regarding bills from these companies.  You will be contacted with the lab results as soon as they are available. The fastest way to get your results is to activate your My Chart account. Instructions are located on the last page of this paperwork. If you have not heard from [place] regarding the results in 2 weeks, please contact this office.        I personally performed the services described in this documentation, which was scribed in my presence. The recorded information has been reviewed and considered, and addended by me as needed.   Signed,   Merri Ray, MD [facility].  01/09/16 10:15 AM

## 2016-01-12 LAB — CULTURE, GROUP A STREP: Strep A Culture: NEGATIVE

## 2016-01-23 ENCOUNTER — Ambulatory Visit: Payer: 59

## 2016-01-31 ENCOUNTER — Other Ambulatory Visit: Payer: Self-pay | Admitting: Obstetrics & Gynecology

## 2016-02-02 NOTE — Telephone Encounter (Signed)
Medication refill request: estrace tab Last AEX:  04/24/15 SM Next AEX: 08/12/16 SM  Last MMG (if hormonal medication request): 08/28/12 [id number]:Neg  Refill authorized: 08/25/15 #[id number]. Today please advise.

## 2016-02-03 ENCOUNTER — Other Ambulatory Visit: Payer: Self-pay | Admitting: Physician Assistant

## 2016-02-03 MED ORDER — ALPRAZOLAM 0.25 MG PO TABS: 0.2500 mg | ORAL_TABLET | Freq: Three times a day (TID) | ORAL | 0 refills | Status: DC | PRN

## 2016-02-03 NOTE — Progress Notes (Signed)
Xanax was called into pharmacy @ 2:25pm

## 2016-02-04 ENCOUNTER — Encounter: Payer: Self-pay | Admitting: Physician Assistant

## 2016-02-05 ENCOUNTER — Encounter: Payer: Self-pay | Admitting: Gastroenterology

## 2016-02-05 ENCOUNTER — Ambulatory Visit (INDEPENDENT_AMBULATORY_CARE_PROVIDER_SITE_OTHER): Payer: 59 | Admitting: Gastroenterology

## 2016-02-05 VITALS — BP 102/68 | HR 60 | Ht 64.0 in | Wt 124.0 lb

## 2016-02-05 DIAGNOSIS — K581 Irritable bowel syndrome with constipation: Secondary | ICD-10-CM | POA: Diagnosis not present

## 2016-02-05 DIAGNOSIS — K219 Gastro-esophageal reflux disease without esophagitis: Secondary | ICD-10-CM | POA: Diagnosis not present

## 2016-02-05 MED ORDER — OMEPRAZOLE 40 MG PO CPDR: 40.0000 mg | DELAYED_RELEASE_CAPSULE | Freq: Every day | ORAL | 2 refills | Status: DC

## 2016-02-05 NOTE — Patient Instructions (Signed)
If you are age 59 or older, your body mass index should be between 23-30. Your Body mass index is 21.28 kg/m. If this is out of the aforementioned range listed, please consider follow up with your Primary Care Provider.  If you are age 57 or younger, your body mass index should be between 19-25. Your Body mass index is 21.28 kg/m. If this is out of the aformentioned range listed, please consider follow up with your Primary Care Provider.    Food Choices for Gastroesophageal Reflux Disease, Adult When you have gastroesophageal reflux disease (GERD), the foods you eat and your eating habits are very important. Choosing the right foods can help ease your discomfort. What guidelines do I need to follow?  Choose fruits, vegetables, whole grains, and low-fat dairy products.  Choose low-fat meat, fish, and poultry.  Limit fats such as oils, salad dressings, butter, nuts, and avocado.  Keep a food diary. This helps you identify foods that cause symptoms.  Avoid foods that cause symptoms. These may be different for everyone.  Eat small meals often instead of 3 large meals a day.  Eat your meals slowly, in a place where you are relaxed.  Limit fried foods.  Cook foods using methods other than frying.  Avoid drinking alcohol.  Avoid drinking large amounts of liquids with your meals.  Avoid bending over or lying down until 2-3 hours after eating. What foods are not recommended? These are some foods and drinks that may make your symptoms worse: Vegetables  Tomatoes. Tomato juice. Tomato and spaghetti sauce. Chili peppers. Onion and garlic. Horseradish. Fruits  Oranges, grapefruit, and lemon (fruit and juice). Meats  High-fat meats, fish, and poultry. This includes hot dogs, ribs, ham, sausage, salami, and bacon. Dairy  Whole milk and chocolate milk. Sour cream. Cream. Butter. Ice cream. Cream cheese. Drinks  Coffee and tea. Bubbly (carbonated) drinks or energy drinks. Condiments    Hot sauce. Barbecue sauce. Sweets/Desserts  Chocolate and cocoa. Donuts. Peppermint and spearmint. Fats and Oils  High-fat foods. This includes [place] fries and potato chips. Other  Vinegar. Strong spices. This includes black pepper, white pepper, red pepper, cayenne, curry powder, cloves, ginger, and chili powder. The items listed above may not be a complete list of foods and drinks to avoid. Contact your dietitian for more information.  This information is not intended to replace advice given to you by your health care provider. Make sure you discuss any questions you have with your health care provider. Document Released: 07/13/2011 Document Revised: 06/19/2015 Document Reviewed: 11/15/2012 Elsevier Interactive Patient Education  2017 [place].  Thank you for choosing [facility]  Dr Wilfrid Lund III

## 2016-02-05 NOTE — Progress Notes (Signed)
[facility] Consult Note:  History: Patricia Ryan 02/05/2016  Referring physician: Alesia Richards, MD  Reason for consult/chief complaint: Heartburn (Chronic, pressure in chest)   Subjective  HPI:  In May 2014, Patricia Ryan wrote: "This is a 61 year old white female with  history of irritable bowel syndrome with predominant constipation. 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 Her job is sedentary. She continues to be constipated, complaining of weight gain from 115 to a current 127 pounds. She takes stool softeners but is afraid to take them every day. She denies rectal bleeding. Patient had a colonoscopy in May 2004 by Patricia Ryan which was a normal exam. She is due for a recall colonoscopy in May of this year. Her mother has celiac disease. Patient underwent a laparoscopic cholecystectomy in 1992 and for brief period of time, had diarrhea but then she reverted to constipation again. She had a positive H. pylori antibody and was treated for 10 days with clarithromycin, amoxicillin and PPI. She is currently taking Protonix 40 mg daily with improvement in her symptoms of dyspepsia"   Colon: hyperplastic polyp EGD: nml duodenal Bx and H pylori cleared after prior triple therapy  Patricia Ryan sees me as a new patient after Dr. Nichola Ryan retirement. She was able to wean off the PPI several months after seeing Dr. Olevia Ryan in 2014. About 2 months ago she started having a recurrence of postprandial pyrosis and chest pressure that occurred both day and night. It is not clear that it was always meal related, and there certainly did not seem to be any consistent food triggers. It was certainly not exertional as well. She denies nausea, vomiting, dysphagia or weight loss. She had incomplete improvement on a brief trial of OTC Nexium recommended by her PCP. She has continued to have irregular bowel habits from her  IBS.  ROS:  Review of Systems  Constitutional: Negative for appetite change and unexpected weight change.  HENT: Negative for mouth sores and voice change.   Eyes: Negative for pain and redness.  Respiratory: Negative for cough and shortness of breath.   Cardiovascular: Negative for chest pain and palpitations.  Genitourinary: Negative for dysuria and hematuria.  Musculoskeletal: Negative for arthralgias and myalgias.  Skin: Negative for pallor and rash.  Neurological: Negative for weakness and headaches.  Hematological: Negative for adenopathy.  Psychiatric/Behavioral: The patient is nervous/anxious.      Past Medical History: Past Medical History:  Diagnosis Date  . Allergy   . Anal fissure   . Anxiety   . Fibroids    Uterine  . GERD (gastroesophageal reflux disease)   . HSV-2 (herpes simplex virus 2) infection   . Hyperlipidemia   . IBS (irritable bowel syndrome)   . Interstitial cystitis   . Migraines   . Nevus    vulva nevus  . Prediabetes      Past Surgical History: Past Surgical History:  Procedure Laterality Date  . BLADDER REPAIR    . BUNIONECTOMY    . CHOLECYSTECTOMY    . LAPAROSCOPY  1985/ 1990   due to infertility  . TONSILLECTOMY    . tubal reconstruction    . tubalplasty  1985/ 1990   . VAGINAL DELIVERY     x2     Family History: Family History  Problem Relation Age of Onset  . Celiac disease Mother   . Heart disease Father   . Irritable bowel syndrome Sister   .  Thyroid disease Sister   . Pancreatic cancer Maternal Grandmother   . Lung cancer Maternal Grandmother   . Diabetes Paternal Grandmother   . Breast cancer Paternal Grandmother   . Kidney disease      tumor  . Colon cancer Neg Hx     Social History: Social History   Social History  . Marital status: Married    Spouse name: N/A  . Number of children: N/A  . Years of education: N/A   Social History Main Topics  . Smoking status: Former Smoker    Types: Cigarettes     Quit date: 12/26/2011  . Smokeless tobacco: Never Used  . Alcohol use 1.2 oz/week    2 Standard drinks or equivalent per week     Comment: weekends mostly  . Drug use: No  . Sexual activity: Yes    Partners: Male    Birth control/ protection: Post-menopausal   Other Topics Concern  . None   Social History Narrative  . None    Allergies: Allergies  Allergen Reactions  . Sulfa Antibiotics Hives, Shortness Of Breath and Rash  . Macrobid [Nitrofurantoin Macrocrystal] Nausea Only    Outpatient Meds: Current Outpatient Prescriptions  Medication Sig Dispense Refill  . ALPRAZolam (XANAX) 0.25 MG tablet Take 1 tablet (0.25 mg total) by mouth 3 (three) times daily as needed for anxiety. 90 tablet 0  . aspirin EC 81 MG tablet Take 1 tablet by mouth. Every other day    . Cholecalciferol (VITAMIN D PO) Take 400 Int'l Units by mouth daily.    Marland Kitchen estradiol (ESTRACE) 0.5 MG tablet TAKE 1 TABLET BY MOUTH EVERY DAY 90 tablet 2  . medroxyPROGESTERone (PROVERA) 2.5 MG tablet TAKE 2 TABLETS (5 MG TOTAL) BY MOUTH DAILY. 90 tablet 4  . meloxicam (MOBIC) 15 MG tablet Take by mouth as needed.  0  . methocarbamol (ROBAXIN) 750 MG tablet Take by mouth as needed.  0  . oxybutynin (DITROPAN-XL) 10 MG 24 hr tablet Take 1 tablet (10 mg total) by mouth at bedtime. 90 tablet 4  . polyethylene glycol powder (GLYCOLAX/MIRALAX) powder MIX 17 GRAMS AS DIRECTED TO TAKE BY MOUTH TWICE A DAY AS DIRECTED 255 g 3  . rosuvastatin (CRESTOR) 20 MG tablet TAKE 1 TABLET (20 MG TOTAL) BY MOUTH DAILY. 30 tablet 3  . valACYclovir (VALTREX) 1000 MG tablet Take 1 tablet by mouth  daily 90 tablet 4  . omeprazole (PRILOSEC) 40 MG capsule Take 1 capsule (40 mg total) by mouth daily. Take at supper meal 30 capsule 2   No current facility-administered medications for this visit.       ___________________________________________________________________ Objective   Exam:  BP 102/68   Pulse 60   Ht 5\' 4"  (1.626 m)   Wt 124  lb (56.2 kg)   LMP 01/20/2011   BMI 21.28 kg/m    General: this is a(n) Well-appearing middle-aged woman with good muscle mass, normal vocal quality   Eyes: sclera anicteric, no redness  ENT: oral mucosa moist without lesions, no cervical or supraclavicular lymphadenopathy, good dentition  CV: RRR without murmur, S1/S2, no JVD, no peripheral edema  Resp: clear to auscultation bilaterally, normal RR and effort noted  GI: soft, no tenderness, with active bowel sounds. No guarding or palpable organomegaly noted.  Skin; warm and dry, no rash or jaundice noted  Neuro: awake, alert and oriented x 3. Normal gross motor function and fluent speech  Labs:  CBC    Component Value Date/Time  WBC 4.2 08/26/2015 1056   RBC 4.59 08/26/2015 1056   HGB 13.9 08/26/2015 1056   HCT 42.1 08/26/2015 1056   PLT 212 08/26/2015 1056   MCV 91.7 08/26/2015 1056   MCV 94.1 12/29/2013 1032   MCH 30.3 08/26/2015 1056   MCHC 33.0 08/26/2015 1056   RDW 13.7 08/26/2015 1056   LYMPHSABS 1,260 08/26/2015 1056   MONOABS 504 08/26/2015 1056   EOSABS 126 08/26/2015 1056   BASOSABS 42 08/26/2015 1056   CMP Latest Ref Rng & Units 08/26/2015 02/07/2015 08/06/2014  Glucose 65 - 99 mg/dL 102(H) 86 92  BUN 7 - 25 mg/dL 12 12 10   Creatinine 0.50 - 1.05 mg/dL 0.68 0.61 0.57  Sodium 135 - 146 mmol/L 142 140 144  Potassium 3.5 - 5.3 mmol/L 4.7 4.4 4.0  Chloride 98 - 110 mmol/L 107 103 104  CO2 20 - 31 mmol/L 23 28 24   Calcium 8.6 - 10.4 mg/dL 9.7 9.6 9.3  Total Protein 6.1 - 8.1 g/dL 6.9 6.8 6.5  Total Bilirubin 0.2 - 1.2 mg/dL 0.4 0.4 0.4  Alkaline Phos 33 - 130 U/L 58 56 57  AST 10 - 35 U/L 18 18 15   ALT 6 - 29 U/L 15 18 13     Assessment: Encounter Diagnoses  Name Primary?  . Gastroesophageal reflux disease without esophagitis Yes  . Irritable bowel syndrome with constipation     Recurrence of GERD without red flag symptoms.  Plan:  6-8 weeks of omeprazole 40 mg once daily at supper meal. She  will then try to wean off the medicine and let me know how things go.  Thank you for the courtesy of this consult.  Please call me with any questions or concerns.  Nelida Meuse III  CC: Alesia Richards, MD

## 2016-02-09 ENCOUNTER — Ambulatory Visit (HOSPITAL_COMMUNITY)
Admission: RE | Admit: 2016-02-09 | Discharge: 2016-02-09 | Disposition: A | Discharge status: Home / Self Care | Payer: 59 | Source: Ambulatory Visit | Attending: Internal Medicine | Admitting: Internal Medicine

## 2016-02-09 ENCOUNTER — Encounter: Payer: Self-pay | Admitting: Internal Medicine

## 2016-02-09 ENCOUNTER — Ambulatory Visit (INDEPENDENT_AMBULATORY_CARE_PROVIDER_SITE_OTHER): Payer: 59 | Admitting: Internal Medicine

## 2016-02-09 VITALS — BP 120/70 | HR 87 | Temp 97.5°F | Resp 16 | Ht 64.0 in | Wt 126.6 lb

## 2016-02-09 DIAGNOSIS — Z1389 Encounter for screening for other disorder: Secondary | ICD-10-CM | POA: Diagnosis not present

## 2016-02-09 DIAGNOSIS — R7303 Prediabetes: Secondary | ICD-10-CM | POA: Diagnosis not present

## 2016-02-09 DIAGNOSIS — R6889 Other general symptoms and signs: Secondary | ICD-10-CM

## 2016-02-09 DIAGNOSIS — N3010 Interstitial cystitis (chronic) without hematuria: Secondary | ICD-10-CM

## 2016-02-09 DIAGNOSIS — F411 Generalized anxiety disorder: Secondary | ICD-10-CM

## 2016-02-09 DIAGNOSIS — M545 Low back pain, unspecified: Secondary | ICD-10-CM

## 2016-02-09 DIAGNOSIS — E782 Mixed hyperlipidemia: Secondary | ICD-10-CM | POA: Diagnosis not present

## 2016-02-09 DIAGNOSIS — Z1329 Encounter for screening for other suspected endocrine disorder: Secondary | ICD-10-CM

## 2016-02-09 DIAGNOSIS — M25551 Pain in right hip: Secondary | ICD-10-CM

## 2016-02-09 DIAGNOSIS — Z130 Encounter for screening for diseases of the blood and blood-forming organs and certain disorders involving the immune mechanism: Secondary | ICD-10-CM

## 2016-02-09 DIAGNOSIS — Z136 Encounter for screening for cardiovascular disorders: Secondary | ICD-10-CM

## 2016-02-09 DIAGNOSIS — Z79899 Other long term (current) drug therapy: Secondary | ICD-10-CM

## 2016-02-09 DIAGNOSIS — E559 Vitamin D deficiency, unspecified: Secondary | ICD-10-CM

## 2016-02-09 DIAGNOSIS — M5136 Other intervertebral disc degeneration, lumbar region: Secondary | ICD-10-CM | POA: Diagnosis not present

## 2016-02-09 DIAGNOSIS — M4316 Spondylolisthesis, lumbar region: Secondary | ICD-10-CM | POA: Diagnosis not present

## 2016-02-09 DIAGNOSIS — Z0001 Encounter for general adult medical examination with abnormal findings: Secondary | ICD-10-CM

## 2016-02-09 DIAGNOSIS — I10 Essential (primary) hypertension: Secondary | ICD-10-CM | POA: Diagnosis not present

## 2016-02-09 LAB — HEMOGLOBIN A1C
Hgb A1c MFr Bld: 5.7 % — ABNORMAL HIGH (ref <5.7)
Mean Plasma Glucose: 117 mg/dL

## 2016-02-09 LAB — TSH: TSH: 1.06 mIU/L

## 2016-02-09 LAB — CBC WITH DIFFERENTIAL/PLATELET
BASOS ABS: 56 {cells}/uL (ref 0–200)
Basophils Relative: 1 %
EOS ABS: 224 {cells}/uL (ref 15–500)
Eosinophils Relative: 4 %
HEMATOCRIT: 40.1 % (ref 35.0–45.0)
HEMOGLOBIN: 13.2 g/dL (ref 11.7–15.5)
LYMPHS ABS: 1456 {cells}/uL (ref 850–3900)
Lymphocytes Relative: 26 %
MCH: 30.3 pg (ref 27.0–33.0)
MCHC: 32.9 g/dL (ref 32.0–36.0)
MCV: 92 fL (ref 80.0–100.0)
MONO ABS: 616 {cells}/uL (ref 200–950)
MPV: 9.5 fL (ref 7.5–12.5)
Monocytes Relative: 11 %
NEUTROS PCT: 58 %
Neutro Abs: 3248 cells/uL (ref 1500–7800)
Platelets: 244 10*3/uL (ref 140–400)
RBC: 4.36 MIL/uL (ref 3.80–5.10)
RDW: 13.6 % (ref 11.0–15.0)
WBC: 5.6 10*3/uL (ref 3.8–10.8)

## 2016-02-09 LAB — VITAMIN B12: VITAMIN B 12: 491 pg/mL (ref 200–1100)

## 2016-02-09 NOTE — Patient Instructions (Signed)
Preventive Care for Adults  A healthy lifestyle and preventive care can promote health and wellness. Preventive health guidelines for women include the following key practices.  A routine yearly physical is a good way to check with your health care provider about your health and preventive screening. It is a chance to share any concerns and updates on your health and to receive a thorough exam.  Visit your dentist for a routine exam and preventive care every 6 months. Brush your teeth twice a day and floss once a day. Good oral hygiene prevents tooth decay and gum disease.  The frequency of eye exams is based on your age, health, family medical history, use of contact lenses, and other factors. Follow your health care provider's recommendations for frequency of eye exams.  Eat a healthy diet. Foods like vegetables, fruits, whole grains, low-fat dairy products, and lean protein foods contain the nutrients you need without too many calories. Decrease your intake of foods high in solid fats, added sugars, and salt. Eat the right amount of calories for you.Get information about a proper diet from your health care provider, if necessary.  Regular physical exercise is one of the most important things you can do for your health. Most adults should get at least 150 minutes of moderate-intensity exercise (any activity that increases your heart rate and causes you to sweat) each week. In addition, most adults need muscle-strengthening exercises on 2 or more days a week.  Maintain a healthy weight. The body mass index (BMI) is a screening tool to identify possible weight problems. It provides an estimate of body fat based on height and weight. Your health care provider can find your BMI and can help you achieve or maintain a healthy weight.For adults 20 years and older:  A BMI below 18.5 is considered underweight.  A BMI of 18.5 to 24.9 is normal.  A BMI of 25 to 29.9 is considered overweight.  A BMI of  30 and above is considered obese.  Maintain normal blood lipids and cholesterol levels by exercising and minimizing your intake of saturated fat. Eat a balanced diet with plenty of fruit and vegetables. Blood tests for lipids and cholesterol should begin at age 20 and be repeated every 5 years. If your lipid or cholesterol levels are high, you are over 50, or you are at high risk for heart disease, you may need your cholesterol levels checked more frequently.Ongoing high lipid and cholesterol levels should be treated with medicines if diet and exercise are not working.  If you smoke, find out from your health care provider how to quit. If you do not use tobacco, do not start.  Lung cancer screening is recommended for adults aged 55-80 years who are at high risk for developing lung cancer because of a history of smoking. A yearly low-dose CT scan of the lungs is recommended for people who have at least a 30-pack-year history of smoking and are a current smoker or have quit within the past 15 years. A pack year of smoking is smoking an average of 1 pack of cigarettes a day for 1 year (for example: 1 pack a day for 30 years or 2 packs a day for 15 years). Yearly screening should continue until the smoker has stopped smoking for at least 15 years. Yearly screening should be stopped for people who develop a health problem that would prevent them from having lung cancer treatment.  High blood pressure causes heart disease and increases the risk of   stroke. Your blood pressure should be checked at least every 1 to 2 years. Ongoing high blood pressure should be treated with medicines if weight loss and exercise do not work.  If you are 55-79 years old, ask your health care provider if you should take aspirin to prevent strokes.  Diabetes screening involves taking a blood sample to check your fasting blood sugar level. This should be done once every 3 years, after age 45, if you are within normal weight and  without risk factors for diabetes. Testing should be considered at a younger age or be carried out more frequently if you are overweight and have at least 1 risk factor for diabetes.  Breast cancer screening is essential preventive care for women. You should practice "breast self-awareness." This means understanding the normal appearance and feel of your breasts and may include breast self-examination. Any changes detected, no matter how small, should be reported to a health care provider. Women in their 20s and 30s should have a clinical breast exam (CBE) by a health care provider as part of a regular health exam every 1 to 3 years. After age 40, women should have a CBE every year. Starting at age 40, women should consider having a mammogram (breast X-ray test) every year. Women who have a family history of breast cancer should talk to their health care provider about genetic screening. Women at a high risk of breast cancer should talk to their health care providers about having an MRI and a mammogram every year.  Breast cancer gene (BRCA)-related cancer risk assessment is recommended for women who have family members with BRCA-related cancers. BRCA-related cancers include breast, ovarian, tubal, and peritoneal cancers. Having family members with these cancers may be associated with an increased risk for harmful changes (mutations) in the breast cancer genes BRCA1 and BRCA2. Results of the assessment will determine the need for genetic counseling and BRCA1 and BRCA2 testing.  Routine pelvic exams to screen for cancer are no longer recommended for nonpregnant women who are considered low risk for cancer of the pelvic organs (ovaries, uterus, and vagina) and who do not have symptoms. Ask your health care provider if a screening pelvic exam is right for you.  If you have had past treatment for cervical cancer or a condition that could lead to cancer, you need Pap tests and screening for cancer for at least 20  years after your treatment. If Pap tests have been discontinued, your risk factors (such as having a new sexual partner) need to be reassessed to determine if screening should be resumed. Some women have medical problems that increase the chance of getting cervical cancer. In these cases, your health care provider may recommend more frequent screening and Pap tests.  Colorectal cancer can be detected and often prevented. Most routine colorectal cancer screening begins at the age of 50 years and continues through age 75 years. However, your health care provider may recommend screening at an earlier age if you have risk factors for colon cancer. On a yearly basis, your health care provider may provide home test kits to check for hidden blood in the stool. Use of a small camera at the end of a tube, to directly examine the colon (sigmoidoscopy or colonoscopy), can detect the earliest forms of colorectal cancer. Talk to your health care provider about this at age 50, when routine screening begins. Direct exam of the colon should be repeated every 5-10 years through age 75 years, unless early forms of pre-cancerous   polyps or small growths are found.  Hepatitis C blood testing is recommended for all people born from 1945 through 1965 and any individual with known risks for hepatitis C.  Pra  Osteoporosis is a disease in which the bones lose minerals and strength with aging. This can result in serious bone fractures or breaks. The risk of osteoporosis can be identified using a bone density scan. Women ages 65 years and over and women at risk for fractures or osteoporosis should discuss screening with their health care providers. Ask your health care provider whether you should take a calcium supplement or vitamin D to reduce the rate of osteoporosis.  Menopause can be associated with physical symptoms and risks. Hormone replacement therapy is available to decrease symptoms and risks. You should talk to your  health care provider about whether hormone replacement therapy is right for you.  Use sunscreen. Apply sunscreen liberally and repeatedly throughout the day. You should seek shade when your shadow is shorter than you. Protect yourself by wearing long sleeves, pants, a wide-brimmed hat, and sunglasses year round, whenever you are outdoors.  Once a month, do a whole body skin exam, using a mirror to look at the skin on your back. Tell your health care provider of new moles, moles that have irregular borders, moles that are larger than a pencil eraser, or moles that have changed in shape or color.  Stay current with required vaccines (immunizations).  Influenza vaccine. All adults should be immunized every year.  Tetanus, diphtheria, and acellular pertussis (Td, Tdap) vaccine. Pregnant women should receive 1 dose of Tdap vaccine during each pregnancy. The dose should be obtained regardless of the length of time since the last dose. Immunization is preferred during the 27th-36th week of gestation. An adult who has not previously received Tdap or who does not know her vaccine status should receive 1 dose of Tdap. This initial dose should be followed by tetanus and diphtheria toxoids (Td) booster doses every 10 years. Adults with an unknown or incomplete history of completing a 3-dose immunization series with Td-containing vaccines should begin or complete a primary immunization series including a Tdap dose. Adults should receive a Td booster every 10 years.  Varicella vaccine. An adult without evidence of immunity to varicella should receive 2 doses or a second dose if she has previously received 1 dose. Pregnant females who do not have evidence of immunity should receive the first dose after pregnancy. This first dose should be obtained before leaving the health care facility. The second dose should be obtained 4-8 weeks after the first dose.  Human papillomavirus (HPV) vaccine. Females aged 13-26 years  who have not received the vaccine previously should obtain the 3-dose series. The vaccine is not recommended for use in pregnant females. However, pregnancy testing is not needed before receiving a dose. If a female is found to be pregnant after receiving a dose, no treatment is needed. In that case, the remaining doses should be delayed until after the pregnancy. Immunization is recommended for any person with an immunocompromised condition through the age of 26 years if she did not get any or all doses earlier. During the 3-dose series, the second dose should be obtained 4-8 weeks after the first dose. The third dose should be obtained 24 weeks after the first dose and 16 weeks after the second dose.  Zoster vaccine. One dose is recommended for adults aged 60 years or older unless certain conditions are present.  Measles, mumps, and rubella (  MMR) vaccine. Adults born before [birth year] generally are considered immune to measles and mumps. Adults born in [birth year] or later should have 1 or more doses of MMR vaccine unless there is a contraindication to the vaccine or there is laboratory evidence of immunity to each of the three diseases. A routine second dose of MMR vaccine should be obtained at least 28 days after the first dose for students attending postsecondary schools, health care workers, or international travelers. People who received inactivated measles vaccine or an unknown type of measles vaccine during 1963-1967 should receive 2 doses of MMR vaccine. People who received inactivated mumps vaccine or an unknown type of mumps vaccine before 1979 and are at high risk for mumps infection should consider immunization with 2 doses of MMR vaccine. For females of childbearing age, rubella immunity should be determined. If there is no evidence of immunity, females who are not pregnant should be vaccinated. If there is no evidence of immunity, females who are pregnant should delay immunization until after pregnancy.  Unvaccinated health care workers born before [birth year] who lack laboratory evidence of measles, mumps, or rubella immunity or laboratory confirmation of disease should consider measles and mumps immunization with 2 doses of MMR vaccine or rubella immunization with 1 dose of MMR vaccine.  Pneumococcal 13-valent conjugate (PCV13) vaccine. When indicated, a person who is uncertain of her immunization history and has no record of immunization should receive the PCV13 vaccine. An adult aged 39 years or older who has certain medical conditions and has not been previously immunized should receive 1 dose of PCV13 vaccine. This PCV13 should be followed with a dose of pneumococcal polysaccharide (PPSV23) vaccine. The PPSV23 vaccine dose should be obtained at least 8 weeks after the dose of PCV13 vaccine. An adult aged 62 years or older who has certain medical conditions and previously received 1 or more doses of PPSV23 vaccine should receive 1 dose of PCV13. The PCV13 vaccine dose should be obtained 1 or more years after the last PPSV23 vaccine dose.    Pneumococcal polysaccharide (PPSV23) vaccine. When PCV13 is also indicated, PCV13 should be obtained first. All adults aged 67 years and older should be immunized. An adult younger than age 45 years who has certain medical conditions should be immunized. Any person who resides in a nursing home or long-term care facility should be immunized. An adult smoker should be immunized. People with an immunocompromised condition and certain other conditions should receive both PCV13 and PPSV23 vaccines. People with human immunodeficiency virus (HIV) infection should be immunized as soon as possible after diagnosis. Immunization during chemotherapy or radiation therapy should be avoided. Routine use of PPSV23 vaccine is not recommended for American Indians, [place] Natives, or people younger than 65 years unless there are medical conditions that require PPSV23 vaccine. When indicated,  people who have unknown immunization and have no record of immunization should receive PPSV23 vaccine. One-time revaccination 5 years after the first dose of PPSV23 is recommended for people aged 19-64 years who have chronic kidney failure, nephrotic syndrome, asplenia, or immunocompromised conditions. People who received 1-2 doses of PPSV23 before age 23 years should receive another dose of PPSV23 vaccine at age 35 years or later if at least 5 years have passed since the previous dose. Doses of PPSV23 are not needed for people immunized with PPSV23 at or after age 38 years.  Preventive Services / Frequency   Ages 43 to 86 years  Blood pressure check.  Lipid and cholesterol check.  Lung  cancer screening. / Every year if you are aged 64-80 years and have a 30-pack-year history of smoking and currently smoke or have quit within the past 15 years. Yearly screening is stopped once you have quit smoking for at least 15 years or develop a health problem that would prevent you from having lung cancer treatment.  Clinical breast exam.** / Every year after age 36 years.  BRCA-related cancer risk assessment.** / For women who have family members with a BRCA-related cancer (breast, ovarian, tubal, or peritoneal cancers).  Mammogram.** / Every year beginning at age 109 years and continuing for as long as you are in good health. Consult with your health care provider.  Pap test.** / Every 3 years starting at age 39 years through age 65 or 28 years with a history of 3 consecutive normal Pap tests.  HPV screening.** / Every 3 years from ages 91 years through ages 32 to 78 years with a history of 3 consecutive normal Pap tests.  Fecal occult blood test (FOBT) of stool. / Every year beginning at age 26 years and continuing until age 12 years. You may not need to do this test if you get a colonoscopy every 10 years.  Flexible sigmoidoscopy or colonoscopy.** / Every 5 years for a flexible sigmoidoscopy or  every 10 years for a colonoscopy beginning at age 34 years and continuing until age 60 years.  Hepatitis C blood test.** / For all people born from [birth year] through 1965 and any individual with known risks for hepatitis C.  Skin self-exam. / Monthly.  Influenza vaccine. / Every year.  Tetanus, diphtheria, and acellular pertussis (Tdap/Td) vaccine.** / Consult your health care provider. Pregnant women should receive 1 dose of Tdap vaccine during each pregnancy. 1 dose of Td every 10 years.  Varicella vaccine.** / Consult your health care provider. Pregnant females who do not have evidence of immunity should receive the first dose after pregnancy.  Zoster vaccine.** / 1 dose for adults aged 11 years or older.  Pneumococcal 13-valent conjugate (PCV13) vaccine.** / Consult your health care provider.  Pneumococcal polysaccharide (PPSV23) vaccine.** / 1 to 2 doses if you smoke cigarettes or if you have certain conditions.  Meningococcal vaccine.** / Consult your health care provider.  Hepatitis A vaccine.** / Consult your health care provider.  Hepatitis B vaccine.** / Consult your health care provider. Screening for abdominal aortic aneurysm (AAA)  by ultrasound is recommended for people over 50 who have history of high blood pressure or who are current or former smokers. ++++++++++++++++++ Recommend Adult Low Dose Aspirin or  coated  Aspirin 81 mg daily  To reduce risk of Colon Cancer 20 %,  Skin Cancer 26 % ,  Melanoma 46%  and  Pancreatic cancer 60% +++++++++++++++++++ Vitamin D goal  is between 70-100.  Please make sure that you are taking your Vitamin D as directed.  It is very important as a natural anti-inflammatory  helping hair, skin, and nails, as well as reducing stroke and heart attack risk.  It helps your bones and helps with mood. It also decreases numerous cancer risks so please take it as directed.  Low Vit D is associated with a 200-300% higher risk for CANCER  and  200-300% higher risk for HEART   ATTACK  &  STROKE.   .....................................Marland Kitchen It is also associated with higher death rate at younger ages,  autoimmune diseases like Rheumatoid arthritis, Lupus, Multiple Sclerosis.    Also many other serious conditions, like depression,  Alzheimer's Dementia, infertility, muscle aches, fatigue, fibromyalgia - just to name a few. ++++++++++++++++++ Recommend the book "The END of DIETING" by Dr Excell Seltzer  & the book "The END of DIABETES " by Dr Excell Seltzer At [email or web address] - get book & Audio CD's    Being diabetic has a  300% increased risk for heart attack, stroke, cancer, and alzheimer- type vascular dementia. It is very important that you work harder with diet by avoiding all foods that are white. Avoid white rice (brown & wild rice is OK), white potatoes (sweetpotatoes in moderation is OK), White bread or wheat bread or anything made out of white flour like bagels, donuts, rolls, buns, biscuits, cakes, pastries, cookies, pizza crust, and pasta (made from white flour & egg whites) - vegetarian pasta or spinach or wheat pasta is OK. Multigrain breads like Arnold's or [employer], or multigrain sandwich thins or flatbreads.  Diet, exercise and weight loss can reverse and cure diabetes in the early stages.  Diet, exercise and weight loss is very important in the control and prevention of complications of diabetes which affects every system in your body, ie. Brain - dementia/stroke, eyes - glaucoma/blindness, heart - heart attack/heart failure, kidneys - dialysis, stomach - gastric paralysis, intestines - malabsorption, nerves - severe painful neuritis, circulation - gangrene & loss of a leg(s), and finally cancer and Alzheimers.    I recommend avoid fried & greasy foods,  sweets/candy, white rice (brown or wild rice or Quinoa is OK), white potatoes (sweet potatoes are OK) - anything made from white flour - bagels, doughnuts, rolls, buns, biscuits,white  and wheat breads, pizza crust and traditional pasta made of white flour & egg white(vegetarian pasta or spinach or wheat pasta is OK).  Multi-grain bread is OK - like multi-grain flat bread or sandwich thins. Avoid alcohol in excess. Exercise is also important.    Eat all the vegetables you want - avoid meat, especially red meat and dairy - especially cheese.  Cheese is the most concentrated form of trans-fats which is the worst thing to clog up our arteries. Veggie cheese is OK which can be found in the fresh produce section at [employer] or [employer] or [employer]  ++++++++++++++++++++++ DASH Eating Plan  DASH stands for "Dietary Approaches to Stop Hypertension."   The DASH eating plan is a healthy eating plan that has been shown to reduce high blood pressure (hypertension). Additional health benefits may include reducing the risk of type 2 diabetes mellitus, heart disease, and stroke. The DASH eating plan may also help with weight loss. WHAT DO I NEED TO KNOW ABOUT THE DASH EATING PLAN? For the DASH eating plan, you will follow these general guidelines:  Choose foods with a percent daily value for sodium of less than 5% (as listed on the food label).  Use salt-free seasonings or herbs instead of table salt or sea salt.  Check with your health care provider or pharmacist before using salt substitutes.  Eat lower-sodium products, often labeled as "lower sodium" or "no salt added."  Eat fresh foods.  Eat more vegetables, fruits, and low-fat dairy products.  Choose whole grains. Look for the word "whole" as the first word in the ingredient list.  Choose fish   Limit sweets, desserts, sugars, and sugary drinks.  Choose heart-healthy fats.  Eat veggie cheese   Eat more home-cooked food and less restaurant, buffet, and fast food.  Limit fried foods.  Cook foods using methods other than frying.  Limit canned  vegetables. If you do use them, rinse them well to decrease the  sodium.  When eating at a restaurant, ask that your food be prepared with less salt, or no salt if possible.                      WHAT FOODS CAN I EAT? Read Dr Joel Fuhrman's books on The End of Dieting & The End of Diabetes  Grains Whole grain or whole wheat bread. Brown rice. Whole grain or whole wheat pasta. Quinoa, bulgur, and whole grain cereals. Low-sodium cereals. Corn or whole wheat flour tortillas. Whole grain cornbread. Whole grain crackers. Low-sodium crackers.  Vegetables Fresh or frozen vegetables (raw, steamed, roasted, or grilled). Low-sodium or reduced-sodium tomato and vegetable juices. Low-sodium or reduced-sodium tomato sauce and paste. Low-sodium or reduced-sodium canned vegetables.   Fruits All fresh, canned (in natural juice), or frozen fruits.  Protein Products  All fish and seafood.  Dried beans, peas, or lentils. Unsalted nuts and seeds. Unsalted canned beans.  Dairy Low-fat dairy products, such as skim or 1% milk, 2% or reduced-fat cheeses, low-fat ricotta or cottage cheese, or plain low-fat yogurt. Low-sodium or reduced-sodium cheeses.  Fats and Oils Tub margarines without trans fats. Light or reduced-fat mayonnaise and salad dressings (reduced sodium). Avocado. Safflower, olive, or canola oils. Natural peanut or almond butter.  Other Unsalted popcorn and pretzels. The items listed above may not be a complete list of recommended foods or beverages. Contact your dietitian for more options.  ++++++++++++++++++  WHAT FOODS ARE NOT RECOMMENDED? Grains/ White flour or wheat flour White bread. White pasta. White rice. Refined cornbread. Bagels and croissants. Crackers that contain trans fat.  Vegetables  Creamed or fried vegetables. Vegetables in a . Regular canned vegetables. Regular canned tomato sauce and paste. Regular tomato and vegetable juices.  Fruits Dried fruits. Canned fruit in light or heavy syrup. Fruit juice.  Meat and Other Protein  Products Meat in general - RED meat & White meat.  Fatty cuts of meat. Ribs, chicken wings, all processed meats as bacon, sausage, bologna, salami, fatback, hot dogs, bratwurst and packaged luncheon meats.  Dairy Whole or 2% milk, cream, half-and-half, and cream cheese. Whole-fat or sweetened yogurt. Full-fat cheeses or blue cheese. Non-dairy creamers and whipped toppings. Processed cheese, cheese spreads, or cheese curds.  Condiments Onion and garlic salt, seasoned salt, table salt, and sea salt. Canned and packaged gravies. Worcestershire sauce. Tartar sauce. Barbecue sauce. Teriyaki sauce. Soy sauce, including reduced sodium. Steak sauce. Fish sauce. Oyster sauce. Cocktail sauce. Horseradish. Ketchup and mustard. Meat flavorings and tenderizers. Bouillon cubes. Hot sauce. Tabasco sauce. Marinades. Taco seasonings. Relishes.  Fats and Oils Butter, stick margarine, lard, shortening and bacon fat. Coconut, palm kernel, or palm oils. Regular salad dressings.  Pickles and olives. Salted popcorn and pretzels.  The items listed above may not be a complete list of foods and beverages to avoid.    

## 2016-02-09 NOTE — Progress Notes (Signed)
Complete Physical  Assessment and Plan:   1. Encounter for general adult medical examination with abnormal findings -due next year  2. Prediabetes -cont diet and exercise -well controlled historically - Hemoglobin A1c - Insulin, random  3. Mixed hyperlipidemia -cont diet and exercise - Lipid panel  4. Screening for deficiency anemia  - Iron and TIBC - Vitamin B12  5. Screening for hematuria or proteinuria  - Urinalysis, Routine w reflex microscopic - Microalbumin / creatinine urine ratio  6. Screening for cardiovascular condition  - EKG 12-Lead  7. Medication management  - CBC with Differential/Platelet - Hepatic function panel - BASIC METABOLIC PANEL WITH GFR - Magnesium  8. Generalized anxiety disorder -cont meds -xanax sparingly   9. Vitamin D deficiency -cont Vit D - VITAMIN D 25 Hydroxy (Vit-D Deficiency, Fractures)  10. Screening for thyroid disorder  - TSH  11. Interstitial cystitis -recent UTI will recheck for infection clearance - Culture, Urine  12. Right hip pain From recent fall -tylenol prn -muscle relaxer prn - DG HIP UNILAT WITH PELVIS 2-3 VIEWS RIGHT; Future  13. Acute right-sided low back pain without sciatica -from recent fall -tylenol prn -cont muscle relaxer prn - DG Lumbar Spine Complete; Future    Discussed med's effects and SE's. Screening labs and tests as requested with regular follow-up as recommended.  HPI  61 y.o. female  presents for a complete physical.  Her blood pressure has been controlled at home, today their BP is BP: 120/70.  She does workout. She denies chest pain, shortness of breath, dizziness.   She is on cholesterol medication and denies myalgias. Her cholesterol is at goal. The cholesterol last visit was:  Lab Results  Component Value Date   CHOL 186 08/26/2015   HDL 62 08/26/2015   LDLCALC 104 08/26/2015   TRIG 98 08/26/2015   CHOLHDL 3.0 08/26/2015  . She is taking the crestor every other  day. She is doing 20 mg every other day.   She has been working on diet and exercise for prediabetes, she is on bASA, she is not on ACE/ARB and denies foot ulcerations, hyperglycemia, hypoglycemia , increased appetite, nausea, paresthesia of the feet, polydipsia, polyuria, visual disturbances, vomiting and weight loss. Last A1C in the office was:  Lab Results  Component Value Date   HGBA1C 5.8 (H) 08/26/2015    Patient is on Vitamin D supplement.   Lab Results  Component Value Date   VD25OH 61 08/26/2015     She reports that she did fall down her stairs not too long ago.  She reports that she went to the urgent care for it and was told that it was likely a muscular.  She did not have any xrays.  She does have some right groin pain.    She is following with Dr. Loletha Carrow and she is currently taking omeprazole 40 mg daily.  She reports that this is helping a little bit.  She reports that she is to take this daily for a month.    She reports that she is doing alright with her interstitial cystitis.  She reports that she is due to have her urine recheck for a UTI.  She recently had an E. Coli infection.  She is taking her oxybutinin.  She reports  That this is working of her.  She is not seeing Dr. Amalia Hailey currently.    Current Medications:  Current Outpatient Prescriptions on File Prior to Visit  Medication Sig Dispense Refill  . ALPRAZolam Duanne Moron)  0.25 MG tablet Take 1 tablet (0.25 mg total) by mouth 3 (three) times daily as needed for anxiety. 90 tablet 0  . aspirin EC 81 MG tablet Take 1 tablet by mouth. Every other day    . Cholecalciferol (VITAMIN D PO) Take 400 Int'l Units by mouth daily.    Marland Kitchen estradiol (ESTRACE) 0.5 MG tablet TAKE 1 TABLET BY MOUTH EVERY DAY 90 tablet 2  . medroxyPROGESTERone (PROVERA) 2.5 MG tablet TAKE 2 TABLETS (5 MG TOTAL) BY MOUTH DAILY. 90 tablet 4  . meloxicam (MOBIC) 15 MG tablet Take by mouth as needed.  0  . methocarbamol (ROBAXIN) 750 MG tablet Take by mouth as  needed.  0  . omeprazole (PRILOSEC) 40 MG capsule Take 1 capsule (40 mg total) by mouth daily. Take at supper meal 30 capsule 2  . oxybutynin (DITROPAN-XL) 10 MG 24 hr tablet Take 1 tablet (10 mg total) by mouth at bedtime. 90 tablet 4  . polyethylene glycol powder (GLYCOLAX/MIRALAX) powder MIX 17 GRAMS AS DIRECTED TO TAKE BY MOUTH TWICE A DAY AS DIRECTED 255 g 3  . rosuvastatin (CRESTOR) 20 MG tablet TAKE 1 TABLET (20 MG TOTAL) BY MOUTH DAILY. 30 tablet 3  . valACYclovir (VALTREX) 1000 MG tablet Take 1 tablet by mouth  daily 90 tablet 4   No current facility-administered medications on file prior to visit.     Health Maintenance:   Immunization History  Administered Date(s) Administered  . DTaP 01/26/2004  . Influenza-Unspecified 10/22/2015  . Tdap 01/28/2014  . Zoster 01/26/2007    Tetanus: 2016 Flu vaccine: 2017 Zostavax: 2009 Pap: July, Dr. Sabra Heck MGM: 2017 Colonoscopy: 2014 Last Dental Exam:  Dr. Ennis Forts Last Eye Exam: Dr. Earl Gala  Patient Care Team: Unk Pinto, MD as PCP - General (Internal Medicine) Lafayette Dragon, MD (Inactive) as Consulting Physician (Gastroenterology)  Allergies:  Allergies  Allergen Reactions  . Sulfa Antibiotics Hives, Shortness Of Breath and Rash  . Macrobid [Nitrofurantoin Macrocrystal] Nausea Only    Medical History:  Past Medical History:  Diagnosis Date  . Allergy   . Anal fissure   . Anxiety   . Fibroids    Uterine  . GERD (gastroesophageal reflux disease)   . HSV-2 (herpes simplex virus 2) infection   . Hyperlipidemia   . IBS (irritable bowel syndrome)   . Interstitial cystitis   . Migraines   . Nevus    vulva nevus  . Prediabetes     Surgical History:  Past Surgical History:  Procedure Laterality Date  . BLADDER REPAIR    . BUNIONECTOMY    . CHOLECYSTECTOMY    . LAPAROSCOPY  1985/ 1990   due to infertility  . TONSILLECTOMY    . tubal reconstruction    . tubalplasty  1985/ 1990   . VAGINAL DELIVERY     x2     Family History:  Family History  Problem Relation Age of Onset  . Celiac disease Mother   . Heart disease Father   . Irritable bowel syndrome Sister   . Thyroid disease Sister   . Pancreatic cancer Maternal Grandmother   . Lung cancer Maternal Grandmother   . Diabetes Paternal Grandmother   . Breast cancer Paternal Grandmother   . Kidney disease      tumor  . Colon cancer Neg Hx     Social History:  Social History  Substance Use Topics  . Smoking status: Former Smoker    Types: Cigarettes    Quit date: 12/26/2011  .  Smokeless tobacco: Never Used  . Alcohol use 1.2 oz/week    2 Standard drinks or equivalent per week     Comment: weekends mostly    Review of Systems: Review of Systems  Constitutional: Negative for chills, fever and malaise/fatigue.  HENT: Negative for congestion, ear pain and sore throat.   Eyes: Negative.   Respiratory: Negative for cough, shortness of breath and wheezing.   Cardiovascular: Negative for chest pain, palpitations and leg swelling.  Gastrointestinal: Negative for abdominal pain, blood in stool, constipation, diarrhea, heartburn and melena.  Genitourinary: Negative.   Skin: Negative.   Neurological: Negative for dizziness, sensory change, loss of consciousness and headaches.  Psychiatric/Behavioral: Negative for depression. The patient is not nervous/anxious and does not have insomnia.     Physical Exam: Estimated body mass index is 21.73 kg/m as calculated from the following:   Height as of this encounter: 5\' 4"  (1.626 m).   Weight as of this encounter: 126 lb 9.6 oz (57.4 kg). BP 120/70   Pulse 87   Temp 97.5 F (36.4 C)   Resp 16   Ht 5\' 4"  (1.626 m)   Wt 126 lb 9.6 oz (57.4 kg)   LMP 01/20/2011   SpO2 99%   BMI 21.73 kg/m   General Appearance: Well nourished well developed, in no apparent distress.  Eyes: PERRLA, EOMs, conjunctiva no swelling or erythema ENT/Mouth: Ear canals normal without obstruction, swelling,  erythema, or discharge.  TMs normal bilaterally with no erythema, bulging, retraction, or loss of landmark.  Oropharynx moist and clear with no exudate, erythema, or swelling.   Neck: Supple, thyroid normal. No bruits.  No cervical adenopathy Respiratory: Respiratory effort normal, Breath sounds clear A&P without wheeze, rhonchi, rales.   Cardio: RRR without murmurs, rubs or gallops. Brisk peripheral pulses without edema.  Chest: symmetric, with normal excursions Breasts: Deferred to GYN Abdomen: Soft, nontender, no guarding, rebound, hernias, masses, or organomegaly.  Lymphatics: Non tender without lymphadenopathy.  Musculoskeletal: Full ROM all peripheral extremities,5/5 strength, and normal gait.  Skin: Warm, dry without rashes, lesions, ecchymosis. Neuro: Awake and oriented X 3, Cranial nerves intact, reflexes equal bilaterally. Normal muscle tone, no cerebellar symptoms. Sensation intact.  Psych:  normal affect, Insight and Judgment appropriate.   EKG: WNL no changes.   Over 40 minutes of exam, counseling, chart review and critical decision making was performed  Starlyn Skeans 9:29 AM [facility]

## 2016-02-10 LAB — BASIC METABOLIC PANEL WITH GFR
BUN: 14 mg/dL (ref 7–25)
CALCIUM: 9.3 mg/dL (ref 8.6–10.4)
CO2: 21 mmol/L (ref 20–31)
CREATININE: 0.69 mg/dL (ref 0.50–1.05)
Chloride: 107 mmol/L (ref 98–110)
GFR, Est African American: >89 mL/min (ref >=60)
GFR, Est Non African American: >89 mL/min (ref >=60)
GLUCOSE: 99 mg/dL (ref 65–99)
Potassium: 4.1 mmol/L (ref 3.5–5.3)
SODIUM: 142 mmol/L (ref 135–146)

## 2016-02-10 LAB — HEPATIC FUNCTION PANEL
ALT: 13 U/L (ref 6–29)
AST: 16 U/L (ref 10–35)
Albumin: 4.2 g/dL (ref 3.6–5.1)
Alkaline Phosphatase: 46 U/L (ref 33–130)
BILIRUBIN DIRECT: 0.1 mg/dL (ref <=0.2)
Indirect Bilirubin: 0.2 mg/dL (ref 0.2–1.2)
TOTAL PROTEIN: 6.7 g/dL (ref 6.1–8.1)
Total Bilirubin: 0.3 mg/dL (ref 0.2–1.2)

## 2016-02-10 LAB — URINALYSIS, MICROSCOPIC ONLY
Bacteria, UA: NONE SEEN [HPF]
CASTS: NONE SEEN [LPF]
CRYSTALS: NONE SEEN [HPF]
RBC / HPF: NONE SEEN RBC/HPF (ref <=2)
SQUAMOUS EPITHELIAL / LPF: NONE SEEN [HPF] (ref <=5)
WBC, UA: NONE SEEN WBC/HPF (ref <=5)
YEAST: NONE SEEN [HPF]

## 2016-02-10 LAB — URINALYSIS, ROUTINE W REFLEX MICROSCOPIC
BILIRUBIN URINE: NEGATIVE
GLUCOSE, UA: NEGATIVE
KETONES UR: NEGATIVE
Leukocytes, UA: NEGATIVE
Nitrite: NEGATIVE
PROTEIN: NEGATIVE
Specific Gravity, Urine: 1.011 (ref 1.001–1.035)
pH: 6 (ref 5.0–8.0)

## 2016-02-10 LAB — IRON AND TIBC
%SAT: 45 % (ref 11–50)
IRON: 144 ug/dL (ref 45–160)
TIBC: 323 ug/dL (ref 250–450)
UIBC: 179 ug/dL (ref 125–400)

## 2016-02-10 LAB — MICROALBUMIN / CREATININE URINE RATIO
CREATININE, URINE: 49 mg/dL (ref 20–320)
Microalb, Ur: <0.2 mg/dL

## 2016-02-10 LAB — LIPID PANEL
Cholesterol: 171 mg/dL (ref <200)
HDL: 53 mg/dL (ref >50)
LDL CALC: 96 mg/dL (ref <100)
Total CHOL/HDL Ratio: 3.2 Ratio (ref <5.0)
Triglycerides: 108 mg/dL (ref <150)
VLDL: 22 mg/dL (ref <30)

## 2016-02-10 LAB — MAGNESIUM: Magnesium: 2.1 mg/dL (ref 1.5–2.5)

## 2016-02-10 LAB — INSULIN, RANDOM: Insulin: 4.2 u[IU]/mL (ref 2.0–19.6)

## 2016-02-10 LAB — VITAMIN D 25 HYDROXY (VIT D DEFICIENCY, FRACTURES): VIT D 25 HYDROXY: 45 ng/mL (ref 30–100)

## 2016-02-16 ENCOUNTER — Other Ambulatory Visit: Payer: Self-pay | Admitting: Internal Medicine

## 2016-02-23 ENCOUNTER — Other Ambulatory Visit: Payer: Self-pay | Admitting: Internal Medicine

## 2016-02-27 ENCOUNTER — Telehealth: Payer: Self-pay | Admitting: Obstetrics & Gynecology

## 2016-02-27 NOTE — Telephone Encounter (Signed)
Left message regarding upcoming appointment has been canceled and needs to be rescheduled. °

## 2016-03-08 ENCOUNTER — Telehealth: Payer: Self-pay | Admitting: Physician Assistant

## 2016-03-08 MED ORDER — CIPROFLOXACIN HCL 500 MG PO TABS: 500.0000 mg | ORAL_TABLET | Freq: Two times a day (BID) | ORAL | 0 refills | Status: DC

## 2016-03-08 NOTE — Telephone Encounter (Signed)
61 y.o. WF called the office with symptoms of frequency, urgency, lower back pain, pressure with urination x Saturday. She states she is unable to come into the office. She does have history of UTI.   Will send in cipro, however since we are NOT checking a urine, if her symptoms do not resolve in 2-3 days she needs to come in for a urine. Getting on an ABX before getting a urine can make it difficult to find out which antibiotic to use if the antibiotic I give you is not susceptible or good for the infection.   If any worsening back pain, AB pain, N/V unable to hold anything down, go to ER.

## 2016-03-08 NOTE — Telephone Encounter (Signed)
Pt was made aware of Rx that was sent & to come into office if she is not better in 2 to 3 days.

## 2016-04-08 ENCOUNTER — Ambulatory Visit (INDEPENDENT_AMBULATORY_CARE_PROVIDER_SITE_OTHER): Payer: 59 | Admitting: Internal Medicine

## 2016-04-08 ENCOUNTER — Encounter: Payer: Self-pay | Admitting: Internal Medicine

## 2016-04-08 VITALS — BP 118/66 | HR 58 | Temp 98.0°F | Resp 18 | Ht 64.0 in | Wt 114.0 lb

## 2016-04-08 DIAGNOSIS — R319 Hematuria, unspecified: Secondary | ICD-10-CM

## 2016-04-08 MED ORDER — CIPROFLOXACIN HCL 500 MG PO TABS: 500.0000 mg | ORAL_TABLET | Freq: Two times a day (BID) | ORAL | 0 refills | Status: AC

## 2016-04-08 NOTE — Progress Notes (Signed)
Assessment and Plan:   1. Hematuria, unspecified type -patient does have a history of Interstitial cystitis -check for possible bacterial infection.  If negative culture will stop abx -recommended increased water intake and avoidence of caffeine and alchol -patient has had full hematuria workup in the past.  - Urinalysis, Reflex Microscopic - Urine culture - ciprofloxacin (CIPRO) 500 MG tablet; Take 1 tablet (500 mg total) by mouth 2 (two) times daily.  Dispense: 20 tablet; Refill: 0    HPI 61 y.o.female presents for hematuria.  She reports that this developed yesterday. She reports that she has been having some mild achiness in her lower back and some mild suprapubic discomfort.  She reports that she has had this quite frequently.  She does have a history of kidney stones but she has not had any flank pain.  She does not have frequency or urgency.  She is drinking more water.    Past Medical History:  Diagnosis Date  . Allergy   . Anal fissure   . Anxiety   . Fibroids    Uterine  . GERD (gastroesophageal reflux disease)   . HSV-2 (herpes simplex virus 2) infection   . Hyperlipidemia   . IBS (irritable bowel syndrome)   . Interstitial cystitis   . Migraines   . Nevus    vulva nevus  . Prediabetes      Allergies  Allergen Reactions  . Sulfa Antibiotics Hives, Shortness Of Breath and Rash  . Macrobid [Nitrofurantoin Macrocrystal] Nausea Only      Current Outpatient Prescriptions on File Prior to Visit  Medication Sig Dispense Refill  . ALPRAZolam (XANAX) 0.25 MG tablet Take 1 tablet (0.25 mg total) by mouth 3 (three) times daily as needed for anxiety. 90 tablet 0  . aspirin EC 81 MG tablet Take 1 tablet by mouth. Every other day    . Cholecalciferol (VITAMIN D PO) Take 400 Int'l Units by mouth daily.    Marland Kitchen estradiol (ESTRACE) 0.5 MG tablet TAKE 1 TABLET BY MOUTH EVERY DAY 90 tablet 2  . medroxyPROGESTERone (PROVERA) 2.5 MG tablet TAKE 2 TABLETS (5 MG TOTAL) BY MOUTH  DAILY. 90 tablet 4  . methocarbamol (ROBAXIN) 750 MG tablet Take by mouth as needed.  0  . omeprazole (PRILOSEC) 40 MG capsule Take 1 capsule (40 mg total) by mouth daily. Take at supper meal 30 capsule 2  . oxybutynin (DITROPAN-XL) 10 MG 24 hr tablet Take 1 tablet (10 mg total) by mouth at bedtime. 90 tablet 4  . polyethylene glycol powder (GLYCOLAX/MIRALAX) powder MIX 17 GRAMS AS DIRECTED TO TAKE BY MOUTH TWICE A DAY AS DIRECTED 255 g 3  . rosuvastatin (CRESTOR) 20 MG tablet TAKE 1 TABLET (20 MG TOTAL) BY MOUTH DAILY. 30 tablet 3  . valACYclovir (VALTREX) 1000 MG tablet Take 1 tablet by mouth  daily 90 tablet 4   No current facility-administered medications on file prior to visit.     Review of Systems  Constitutional: Negative for chills, fever and malaise/fatigue.  HENT: Negative for congestion, ear pain and sore throat.   Eyes: Negative.   Respiratory: Negative for cough, shortness of breath and wheezing.   Cardiovascular: Negative for chest pain, palpitations and leg swelling.  Gastrointestinal: Negative for abdominal pain, blood in stool, constipation, diarrhea, heartburn and melena.  Genitourinary: Positive for frequency, hematuria and urgency. Negative for dysuria and flank pain.  Skin: Negative.   Neurological: Negative for dizziness, sensory change, loss of consciousness and headaches.  Psychiatric/Behavioral: Negative for  depression. The patient is not nervous/anxious and does not have insomnia.      Physical Exam: Filed Weights   04/08/16 1451  Weight: 114 lb (51.7 kg)   BP 118/66   Pulse (!) 58   Temp 98 F (36.7 C) (Temporal)   Resp 18   Ht 5\' 4"  (1.626 m)   Wt 114 lb (51.7 kg)   LMP 01/20/2011   BMI 19.57 kg/m  General Appearance: Well developed well nourished, non-toxic appearing in no apparent distress. Eyes: PERRLA, EOMs, conjunctiva w/ no swelling or erythema or discharge Sinuses: No Frontal/maxillary tenderness ENT/Mouth: Ear canals clear without  swelling or erythema.  TM's normal bilaterally with no retractions, bulging, or loss of landmarks.   Neck: Supple, thyroid normal, no notable JVD  Respiratory: Respiratory effort normal, Clear breath sounds anteriorly and posteriorly bilaterally without rales, rhonchi, wheezing or stridor. No retractions or accessory muscle usage. Cardio: RRR with no MRGs.   Abdomen: Soft, + BS.  Non tender, no guarding, rebound, hernias, masses.  Musculoskeletal: Full ROM, 5/5 strength, normal gait.  Skin: Warm, dry without rashes  Neuro: Awake and oriented X 3, Cranial nerves intact. Normal muscle tone, no cerebellar symptoms. Sensation intact.  Psych: normal affect, Insight and Judgment appropriate.     Starlyn Skeans, PA-C 3:03 PM [facility]

## 2016-04-09 LAB — URINALYSIS, MICROSCOPIC ONLY
CASTS: NONE SEEN [LPF]
RBC / HPF: >60 RBC/HPF — AB (ref <=2)
YEAST: NONE SEEN [HPF]

## 2016-04-09 LAB — URINALYSIS, ROUTINE W REFLEX MICROSCOPIC
GLUCOSE, UA: NEGATIVE
Nitrite: POSITIVE — AB
PH: 5 (ref 5.0–8.0)
SPECIFIC GRAVITY, URINE: 1.031 (ref 1.001–1.035)

## 2016-04-09 LAB — URINE CULTURE: Organism ID, Bacteria: NO GROWTH

## 2016-05-13 ENCOUNTER — Ambulatory Visit: Payer: Self-pay

## 2016-05-20 ENCOUNTER — Other Ambulatory Visit: Payer: Self-pay | Admitting: Obstetrics & Gynecology

## 2016-05-20 NOTE — Telephone Encounter (Signed)
Medication refill request: Oxybutynin Last AEX:  04/24/15 SM Next AEX: 05/25/16 SM Last MMG (if hormonal medication request): 08/29/15 BIRADS1, Density B, [facility] Refill authorized: 04/24/15 #[id number]. Please advise. Thank you.

## 2016-05-24 ENCOUNTER — Other Ambulatory Visit: Payer: Self-pay | Admitting: Obstetrics & Gynecology

## 2016-05-24 NOTE — Telephone Encounter (Signed)
Medication refill request: valtrex  Last AEX:  04/24/15 SM Next AEX: 05/25/16 SM Last MMG (if hormonal medication request): 08/29/15 BIRADS1:Neg  Refill authorized: 04/24/15 #[id number] with 4 refills. Today please advise.

## 2016-05-25 ENCOUNTER — Ambulatory Visit (INDEPENDENT_AMBULATORY_CARE_PROVIDER_SITE_OTHER): Payer: 59 | Admitting: Obstetrics & Gynecology

## 2016-05-25 ENCOUNTER — Other Ambulatory Visit (HOSPITAL_COMMUNITY)
Admission: RE | Admit: 2016-05-25 | Discharge: 2016-05-25 | Disposition: A | Discharge status: Home / Self Care | Payer: 59 | Source: Ambulatory Visit | Attending: Obstetrics & Gynecology | Admitting: Obstetrics & Gynecology

## 2016-05-25 ENCOUNTER — Encounter: Payer: Self-pay | Admitting: Obstetrics & Gynecology

## 2016-05-25 VITALS — BP 118/60 | HR 68 | Resp 16 | Ht 63.5 in | Wt 114.0 lb

## 2016-05-25 DIAGNOSIS — R87810 Cervical high risk human papillomavirus (HPV) DNA test positive: Secondary | ICD-10-CM | POA: Diagnosis present

## 2016-05-25 DIAGNOSIS — B977 Papillomavirus as the cause of diseases classified elsewhere: Secondary | ICD-10-CM | POA: Insufficient documentation

## 2016-05-25 DIAGNOSIS — Z124 Encounter for screening for malignant neoplasm of cervix: Secondary | ICD-10-CM | POA: Diagnosis not present

## 2016-05-25 DIAGNOSIS — Z01419 Encounter for gynecological examination (general) (routine) without abnormal findings: Secondary | ICD-10-CM

## 2016-05-25 MED ORDER — MEDROXYPROGESTERONE ACETATE 2.5 MG PO TABS: ORAL_TABLET | ORAL | 4 refills | Status: DC

## 2016-05-25 MED ORDER — VALACYCLOVIR HCL 1 G PO TABS
1000.0000 mg | ORAL_TABLET | Freq: Every day | ORAL | 4 refills | Status: DC
Start: 2016-05-25 — End: 2017-05-30

## 2016-05-25 MED ORDER — ESTRADIOL 0.5 MG PO TABS: 0.5000 mg | ORAL_TABLET | Freq: Every day | ORAL | 4 refills | Status: DC

## 2016-05-25 MED ORDER — OXYBUTYNIN CHLORIDE ER 10 MG PO TB24: 10.0000 mg | ORAL_TABLET | Freq: Every day | ORAL | 4 refills | Status: DC

## 2016-05-25 NOTE — Progress Notes (Addendum)
61 y.o. G2P2 MarriedCaucasianF here for annual exam.  Doing well.  Had blood work done 1/18.  HbA1C was 5.7.  This is being followed yearly.  She is taking care of her 53 month old grandson.  She does also take care of her 59 year old.  Patient's last menstrual period was 01/20/2011.          Sexually active: Yes.    The current method of family planning is post menopausal status.    Exercising: Yes.    gym Smoker:  no  Health Maintenance: Pap:  04/24/15 Neg: (+) HR HPV; 02/05/13 Neg History of abnormal Pap:  Yes, (+) HR HPV 04/24/15 MMG:  08/29/15 BIRADS1, Density B, [facility] Colonoscopy:  07/12/12 Polyps repeat 10 years  BMD:   Never TDaP:  01/2014  Zostavax: age 10 Screening Labs: PCP   reports that she quit smoking about 4 years ago. Her smoking use included Cigarettes. She has never used smokeless tobacco. She reports that she drinks about 1.2 oz of alcohol per week . She reports that she does not use drugs.  Past Medical History:  Diagnosis Date  . Allergy   . Anal fissure   . Anxiety   . Fibroids    Uterine  . GERD (gastroesophageal reflux disease)   . HSV-2 (herpes simplex virus 2) infection   . Hyperlipidemia   . IBS (irritable bowel syndrome)   . Interstitial cystitis   . Migraines   . Nevus    vulva nevus  . Prediabetes     Past Surgical History:  Procedure Laterality Date  . BLADDER REPAIR    . BUNIONECTOMY    . CHOLECYSTECTOMY    . LAPAROSCOPY  1985/ 1990   due to infertility  . TONSILLECTOMY    . tubal reconstruction    . tubalplasty  1985/ 1990   . VAGINAL DELIVERY     x2    Current Outpatient Prescriptions  Medication Sig Dispense Refill  . ALPRAZolam (XANAX) 0.25 MG tablet Take 1 tablet (0.25 mg total) by mouth 3 (three) times daily as needed for anxiety. 90 tablet 0  . aspirin EC 81 MG tablet Take 1 tablet by mouth. Every other day    . Calcium-Magnesium-Vitamin D (CALCIUM 500 PO) Take 1 tablet by mouth daily.    . Cholecalciferol (VITAMIN D PO) Take 400  Int'l Units by mouth daily.    Marland Kitchen estradiol (ESTRACE) 0.5 MG tablet TAKE 1 TABLET BY MOUTH EVERY DAY 90 tablet 2  . medroxyPROGESTERone (PROVERA) 2.5 MG tablet TAKE 2 TABLETS (5 MG TOTAL) BY MOUTH DAILY. 90 tablet 4  . omeprazole (PRILOSEC) 40 MG capsule Take 1 capsule (40 mg total) by mouth daily. Take at supper meal 30 capsule 2  . oxybutynin (DITROPAN-XL) 10 MG 24 hr tablet TAKE 1 TABLET BY MOUTH AT  BEDTIME 90 tablet 0  . polyethylene glycol powder (GLYCOLAX/MIRALAX) powder MIX 17 GRAMS AS DIRECTED TO TAKE BY MOUTH TWICE A DAY AS DIRECTED 255 g 3  . rosuvastatin (CRESTOR) 20 MG tablet TAKE 1 TABLET (20 MG TOTAL) BY MOUTH DAILY. (Patient taking differently: Take 20 mg by mouth every other day. ) 30 tablet 3  . valACYclovir (VALTREX) 1000 MG tablet TAKE 1 TABLET BY MOUTH  DAILY 90 tablet 0   No current facility-administered medications for this visit.     Family History  Problem Relation Age of Onset  . Celiac disease Mother   . Heart disease Father   . Irritable bowel syndrome  Sister   . Thyroid disease Sister   . Pancreatic cancer Maternal Grandmother   . Lung cancer Maternal Grandmother   . Diabetes Paternal Grandmother   . Breast cancer Paternal Grandmother   . Kidney disease      tumor  . Colon cancer Neg Hx     ROS:  Pertinent items are noted in HPI.  Otherwise, a comprehensive ROS was negative.  Exam:   BP 118/60 (BP Location: Right Arm, Patient Position: Sitting, Cuff Size: Normal)   Pulse 68   Resp 16   Ht 5' 3.5" (1.613 m)   Wt 114 lb (51.7 kg)   LMP 01/20/2011   BMI 19.88 kg/m      Height: 5' 3.5" (161.3 cm)  Ht Readings from Last 3 Encounters:  05/25/16 5' 3.5" (1.613 m)  04/08/16 5\' 4"  (1.626 m)  02/09/16 5\' 4"  (1.626 m)    General appearance: alert, cooperative and appears stated age Head: Normocephalic, without obvious abnormality, atraumatic Neck: no adenopathy, supple, symmetrical, trachea midline and thyroid normal to inspection and palpation Lungs:  clear to auscultation bilaterally Breasts: normal appearance, no masses or tenderness Heart: regular rate and rhythm Abdomen: soft, non-tender; bowel sounds normal; no masses,  no organomegaly Extremities: extremities normal, atraumatic, no cyanosis or edema Skin: Skin color, texture, turgor normal. No rashes or lesions Lymph nodes: Cervical, supraclavicular, and axillary nodes normal. No abnormal inguinal nodes palpated Neurologic: Grossly normal   Pelvic: External genitalia:  no lesions              Urethra:  normal appearing urethra with no masses, tenderness or lesions              Bartholins and Skenes: normal                 Vagina: normal appearing vagina with normal color and discharge, no lesions              Cervix: no lesions              Pap taken: Yes.   Bimanual Exam:  Uterus:  normal size, contour, position, consistency, mobility, non-tender              Adnexa: normal adnexa and no mass, fullness, tenderness               Rectovaginal: Confirms               Anus:  normal sphincter tone, no lesions  Chaperone was present for exam.  A:  Well Woman with normal exam PMP., no HRT Exposure to HPV and genital warts with current and long term partner Elevated HbA1c, mildly, being followed Elevated lipids IC--released by Dr. Amalia Hailey.  I write rx for her now. 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  RF to local pharmacy Pt is going to have a BMD with MMG this year RF for valtrex 1gm daily.  #90/4RF RF for oxybutynin Xl 10mg  daily #90/4RF Lab work and vaccines UTD with PCP return annually or prn

## 2016-05-25 NOTE — Patient Instructions (Signed)
Consider doing a bone density with your mammogram this year.

## 2016-05-31 LAB — CYTOLOGY - PAP
Diagnosis: NEGATIVE
HPV 16/18/45 genotyping: NEGATIVE
HPV: DETECTED — AB

## 2016-06-02 ENCOUNTER — Telehealth: Payer: Self-pay | Admitting: *Deleted

## 2016-06-02 NOTE — Telephone Encounter (Signed)
Spoke with patient, advised of results and recommendations as seen below per Dr. Sabra Heck. Patient verbalizes understanding and is agreeable.  Routing to provider for final review. Patient is agreeable to disposition. Will close encounter.

## 2016-06-02 NOTE — Telephone Encounter (Signed)
-----   Message from Megan Salon, MD sent at 06/02/2016  6:12 AM EDT ----- Please let pt know her pap was normal.  The high risk HPV was positive but the 16/18/45 testing was negative.  Repeat pap and HR HPV 1 year.  08 recall.  Remove from any current recall.  Thanks.

## 2016-06-02 NOTE — Telephone Encounter (Signed)
Left message to call Sharee Pimple at [phone].  08 recall placed.

## 2016-06-03 ENCOUNTER — Ambulatory Visit (INDEPENDENT_AMBULATORY_CARE_PROVIDER_SITE_OTHER): Payer: 59

## 2016-06-03 DIAGNOSIS — R8271 Bacteriuria: Secondary | ICD-10-CM

## 2016-06-03 NOTE — Progress Notes (Signed)
PT PRESENTS FOR U/A & U/C. PT STATES SHE FINISHED HER ABX & HAVE NO OTHER SXS.

## 2016-06-04 LAB — URINALYSIS, ROUTINE W REFLEX MICROSCOPIC
Bilirubin Urine: NEGATIVE
Glucose, UA: NEGATIVE
Hgb urine dipstick: NEGATIVE
KETONES UR: NEGATIVE
Leukocytes, UA: NEGATIVE
NITRITE: NEGATIVE
PROTEIN: NEGATIVE
SPECIFIC GRAVITY, URINE: 1.028 (ref 1.001–1.035)
pH: 5.5 (ref 5.0–8.0)

## 2016-06-04 LAB — URINE CULTURE: Organism ID, Bacteria: NO GROWTH

## 2016-06-08 ENCOUNTER — Ambulatory Visit: Payer: 59 | Admitting: Obstetrics & Gynecology

## 2016-07-01 DIAGNOSIS — M1611 Unilateral primary osteoarthritis, right hip: Secondary | ICD-10-CM | POA: Diagnosis not present

## 2016-07-01 DIAGNOSIS — M545 Low back pain: Secondary | ICD-10-CM | POA: Diagnosis not present

## 2016-07-13 ENCOUNTER — Other Ambulatory Visit: Payer: Self-pay | Admitting: Physician Assistant

## 2016-07-13 MED ORDER — ALPRAZOLAM 0.25 MG PO TABS: 0.2500 mg | ORAL_TABLET | Freq: Three times a day (TID) | ORAL | 0 refills | Status: DC | PRN

## 2016-07-13 NOTE — Progress Notes (Signed)
Xanax was called into pharmacy on 19th June 2018 @ 2:27pm by DD

## 2016-07-18 ENCOUNTER — Other Ambulatory Visit: Payer: Self-pay | Admitting: Physician Assistant

## 2016-07-25 ENCOUNTER — Other Ambulatory Visit: Payer: Self-pay | Admitting: Gastroenterology

## 2016-07-27 DIAGNOSIS — M65872 Other synovitis and tenosynovitis, left ankle and foot: Secondary | ICD-10-CM | POA: Diagnosis not present

## 2016-07-29 DIAGNOSIS — M47816 Spondylosis without myelopathy or radiculopathy, lumbar region: Secondary | ICD-10-CM | POA: Diagnosis not present

## 2016-07-29 DIAGNOSIS — M1811 Unilateral primary osteoarthritis of first carpometacarpal joint, right hand: Secondary | ICD-10-CM | POA: Diagnosis not present

## 2016-07-29 DIAGNOSIS — M1812 Unilateral primary osteoarthritis of first carpometacarpal joint, left hand: Secondary | ICD-10-CM | POA: Diagnosis not present

## 2016-08-02 ENCOUNTER — Telehealth: Payer: Self-pay

## 2016-08-02 ENCOUNTER — Other Ambulatory Visit: Payer: Self-pay

## 2016-08-02 MED ORDER — OMEPRAZOLE 40 MG PO CPDR: 40.0000 mg | DELAYED_RELEASE_CAPSULE | Freq: Every day | ORAL | 2 refills | Status: DC

## 2016-08-02 NOTE — Telephone Encounter (Signed)
Yes, please refill for a month with 3 refills

## 2016-08-02 NOTE — Telephone Encounter (Signed)
Refill request for omeprazole 40 mg one a day. Last seen 01-2016.

## 2016-08-02 NOTE — Telephone Encounter (Signed)
Refilled as directed by Dr [name]  

## 2016-08-12 ENCOUNTER — Ambulatory Visit: Payer: 59 | Admitting: Obstetrics & Gynecology

## 2016-08-12 ENCOUNTER — Ambulatory Visit: Payer: 59 | Admitting: Podiatry

## 2016-08-24 DIAGNOSIS — M545 Low back pain: Secondary | ICD-10-CM | POA: Diagnosis not present

## 2016-08-26 DIAGNOSIS — M545 Low back pain: Secondary | ICD-10-CM | POA: Diagnosis not present

## 2016-08-31 DIAGNOSIS — Z780 Asymptomatic menopausal state: Secondary | ICD-10-CM | POA: Diagnosis not present

## 2016-08-31 DIAGNOSIS — Z1231 Encounter for screening mammogram for malignant neoplasm of breast: Secondary | ICD-10-CM | POA: Diagnosis not present

## 2016-08-31 DIAGNOSIS — M8589 Other specified disorders of bone density and structure, multiple sites: Secondary | ICD-10-CM | POA: Diagnosis not present

## 2016-09-16 DIAGNOSIS — H47323 Drusen of optic disc, bilateral: Secondary | ICD-10-CM | POA: Diagnosis not present

## 2016-09-16 DIAGNOSIS — H43393 Other vitreous opacities, bilateral: Secondary | ICD-10-CM | POA: Diagnosis not present

## 2016-09-16 DIAGNOSIS — H2513 Age-related nuclear cataract, bilateral: Secondary | ICD-10-CM | POA: Diagnosis not present

## 2016-09-28 DIAGNOSIS — D225 Melanocytic nevi of trunk: Secondary | ICD-10-CM | POA: Diagnosis not present

## 2016-09-28 DIAGNOSIS — D235 Other benign neoplasm of skin of trunk: Secondary | ICD-10-CM | POA: Diagnosis not present

## 2016-09-28 DIAGNOSIS — D2230 Melanocytic nevi of unspecified part of face: Secondary | ICD-10-CM | POA: Diagnosis not present

## 2016-10-01 DIAGNOSIS — M545 Low back pain: Secondary | ICD-10-CM | POA: Diagnosis not present

## 2016-10-01 DIAGNOSIS — M47816 Spondylosis without myelopathy or radiculopathy, lumbar region: Secondary | ICD-10-CM | POA: Diagnosis not present

## 2016-10-21 DIAGNOSIS — M47816 Spondylosis without myelopathy or radiculopathy, lumbar region: Secondary | ICD-10-CM | POA: Diagnosis not present

## 2016-10-25 ENCOUNTER — Encounter: Payer: Self-pay | Admitting: Obstetrics & Gynecology

## 2016-10-26 DIAGNOSIS — M5126 Other intervertebral disc displacement, lumbar region: Secondary | ICD-10-CM | POA: Diagnosis not present

## 2016-10-26 DIAGNOSIS — M9903 Segmental and somatic dysfunction of lumbar region: Secondary | ICD-10-CM | POA: Diagnosis not present

## 2016-10-26 DIAGNOSIS — M9904 Segmental and somatic dysfunction of sacral region: Secondary | ICD-10-CM | POA: Diagnosis not present

## 2016-10-29 DIAGNOSIS — M5126 Other intervertebral disc displacement, lumbar region: Secondary | ICD-10-CM | POA: Diagnosis not present

## 2016-10-29 DIAGNOSIS — Z23 Encounter for immunization: Secondary | ICD-10-CM | POA: Diagnosis not present

## 2016-10-29 DIAGNOSIS — M9904 Segmental and somatic dysfunction of sacral region: Secondary | ICD-10-CM | POA: Diagnosis not present

## 2016-10-29 DIAGNOSIS — M9903 Segmental and somatic dysfunction of lumbar region: Secondary | ICD-10-CM | POA: Diagnosis not present

## 2016-11-02 DIAGNOSIS — M9903 Segmental and somatic dysfunction of lumbar region: Secondary | ICD-10-CM | POA: Diagnosis not present

## 2016-11-02 DIAGNOSIS — M5126 Other intervertebral disc displacement, lumbar region: Secondary | ICD-10-CM | POA: Diagnosis not present

## 2016-11-02 DIAGNOSIS — M9904 Segmental and somatic dysfunction of sacral region: Secondary | ICD-10-CM | POA: Diagnosis not present

## 2016-11-04 DIAGNOSIS — M9904 Segmental and somatic dysfunction of sacral region: Secondary | ICD-10-CM | POA: Diagnosis not present

## 2016-11-04 DIAGNOSIS — M9903 Segmental and somatic dysfunction of lumbar region: Secondary | ICD-10-CM | POA: Diagnosis not present

## 2016-11-04 DIAGNOSIS — M5126 Other intervertebral disc displacement, lumbar region: Secondary | ICD-10-CM | POA: Diagnosis not present

## 2016-11-08 DIAGNOSIS — M9904 Segmental and somatic dysfunction of sacral region: Secondary | ICD-10-CM | POA: Diagnosis not present

## 2016-11-08 DIAGNOSIS — M9903 Segmental and somatic dysfunction of lumbar region: Secondary | ICD-10-CM | POA: Diagnosis not present

## 2016-11-08 DIAGNOSIS — M5126 Other intervertebral disc displacement, lumbar region: Secondary | ICD-10-CM | POA: Diagnosis not present

## 2016-11-12 ENCOUNTER — Other Ambulatory Visit: Payer: Self-pay | Admitting: Physician Assistant

## 2016-11-12 DIAGNOSIS — M9903 Segmental and somatic dysfunction of lumbar region: Secondary | ICD-10-CM | POA: Diagnosis not present

## 2016-11-12 DIAGNOSIS — M5126 Other intervertebral disc displacement, lumbar region: Secondary | ICD-10-CM | POA: Diagnosis not present

## 2016-11-12 DIAGNOSIS — M9904 Segmental and somatic dysfunction of sacral region: Secondary | ICD-10-CM | POA: Diagnosis not present

## 2016-11-12 NOTE — Telephone Encounter (Signed)
Please call Alpraz  

## 2016-11-15 NOTE — Telephone Encounter (Signed)
Xanax has been called into pharmacy on 22nd Oct 2018 by DD

## 2016-11-17 DIAGNOSIS — M9903 Segmental and somatic dysfunction of lumbar region: Secondary | ICD-10-CM | POA: Diagnosis not present

## 2016-11-17 DIAGNOSIS — M9904 Segmental and somatic dysfunction of sacral region: Secondary | ICD-10-CM | POA: Diagnosis not present

## 2016-11-17 DIAGNOSIS — M5126 Other intervertebral disc displacement, lumbar region: Secondary | ICD-10-CM | POA: Diagnosis not present

## 2016-11-24 DIAGNOSIS — M9904 Segmental and somatic dysfunction of sacral region: Secondary | ICD-10-CM | POA: Diagnosis not present

## 2016-11-24 DIAGNOSIS — M5126 Other intervertebral disc displacement, lumbar region: Secondary | ICD-10-CM | POA: Diagnosis not present

## 2016-11-24 DIAGNOSIS — M9903 Segmental and somatic dysfunction of lumbar region: Secondary | ICD-10-CM | POA: Diagnosis not present

## 2016-12-11 ENCOUNTER — Other Ambulatory Visit: Payer: Self-pay | Admitting: Internal Medicine

## 2016-12-11 MED ORDER — BENZONATATE 200 MG PO CAPS: ORAL_CAPSULE | ORAL | 1 refills | Status: DC

## 2016-12-11 MED ORDER — PREDNISONE 20 MG PO TABS: ORAL_TABLET | ORAL | 0 refills | Status: DC

## 2016-12-11 MED ORDER — PROMETHAZINE-DM 6.25-15 MG/5ML PO SYRP: ORAL_SOLUTION | ORAL | 1 refills | Status: DC

## 2016-12-11 MED ORDER — AZITHROMYCIN 250 MG PO TABS: ORAL_TABLET | ORAL | 1 refills | Status: DC

## 2016-12-31 DIAGNOSIS — M47816 Spondylosis without myelopathy or radiculopathy, lumbar region: Secondary | ICD-10-CM | POA: Diagnosis not present

## 2016-12-31 DIAGNOSIS — M545 Low back pain: Secondary | ICD-10-CM | POA: Diagnosis not present

## 2017-01-25 DIAGNOSIS — N200 Calculus of kidney: Secondary | ICD-10-CM

## 2017-01-25 HISTORY — DX: Calculus of kidney: N20.0

## 2017-02-01 DIAGNOSIS — M545 Low back pain: Secondary | ICD-10-CM | POA: Diagnosis not present

## 2017-02-01 DIAGNOSIS — M47816 Spondylosis without myelopathy or radiculopathy, lumbar region: Secondary | ICD-10-CM | POA: Diagnosis not present

## 2017-02-02 ENCOUNTER — Other Ambulatory Visit: Payer: Self-pay | Admitting: Internal Medicine

## 2017-02-07 ENCOUNTER — Encounter: Payer: Self-pay | Admitting: Physician Assistant

## 2017-02-07 NOTE — Progress Notes (Signed)
Complete Physical  Assessment and Plan:  Mixed hyperlipidemia -continue medications, check lipids, decrease fatty foods, increase activity.  -     Lipid panel -     EKG 12-Lead  Migraine without aura and without status migrainosus, not intractable Avoid triggers  Irritable bowel syndrome with constipation Increase fiber, diet discussed  Constipation, unspecified constipation type Increase fiber, diet discussed  Anxiety state -continue medications, stress management techniques discussed, increase water, good sleep hygiene discussed, increase exercise, and increase veggies.   Chronic interstitial cystitis Continue meds, decrease stress, artifical sugars.   Prediabetes Discussed disease progression and risks Discussed diet/exercise, weight management and risk modification -     Hemoglobin A1c  HSV-2 infection Continue medications  Allergic state, sequela Continue OTC meds  Medication management -     CBC with Differential/Platelet -     BASIC METABOLIC PANEL WITH GFR -     Hepatic function panel -     Magnesium  Screening for thyroid disorder -     TSH  Encounter for general adult medical examination with abnormal findings 1 year  Adult BMI <19 kg/sq m Add on protein powder, start to work out again.   Hematuria, unspecified type -     Urinalysis, Routine w reflex microscopic -     Microalbumin / creatinine urine ratio - ? Related to atrophy and increasing UTI's, will treat - ? Need referral back to urology has been years since evaluation- declines at this time, will monitor  Vaginal atrophy -     conjugated estrogens (PREMARIN) vaginal cream; Place 1 Applicatorful vaginally daily. Follow up GYN  Vitamin D deficiency -     VITAMIN D 25 Hydroxy (Vit-D Deficiency, Fractures)  Screening, anemia, deficiency, iron -     Iron,Total/Total Iron Binding Cap -     Vitamin B12   Discussed med's effects and SE's. Screening labs and tests as requested with regular  follow-up as recommended. Future Appointments  Date Time Provider [place]  08/25/2017  9:00 AM Megan Salon, MD [place] None  02/08/2018  9:00 AM Vicie Mutters, PA-C [id number] None    HPI  62 y.o. female  presents for a complete physical.  Patricia Ryan reports that Patricia Ryan is doing alright with her interstitial cystitis. Has seen Dr. Amalia Hailey in the past for hematuria, has had repeated UTI and antibiotics help. Patricia Ryan has vaginal atrophy, painful intercourse.   Her blood pressure has been controlled at home, today their BP is BP: 110/82.  Patricia Ryan does workout. Patricia Ryan denies chest pain, shortness of breath, dizziness.   Patricia Ryan is on cholesterol medication. Patricia Ryan is doing 20 mg every other day. Her cholesterol is at goal. The cholesterol last visit was:  Lab Results  Component Value Date   CHOL 171 02/09/2016   HDL 53 02/09/2016   LDLCALC 96 02/09/2016   TRIG 108 02/09/2016   CHOLHDL 3.2 02/09/2016   Patricia Ryan has been working on diet and exercise for prediabetes, Patricia Ryan is on bASA, Patricia Ryan is not on ACE/ARB and denies foot ulcerations, hyperglycemia, hypoglycemia , increased appetite, nausea, paresthesia of the feet, polydipsia, polyuria, visual disturbances, vomiting and weight loss. Last A1C in the office was:  Lab Results  Component Value Date   HGBA1C 5.7 (H) 02/09/2016   Patient is on Vitamin D supplement.   Lab Results  Component Value Date   VD25OH 45 02/09/2016     Patricia Ryan is following with Dr. Loletha Carrow and Patricia Ryan is currently taking omeprazole 40 mg daily.  BMI is Body mass index is 19.15 kg/m., Patricia Ryan is working on diet and exercise but Patricia Ryan has lost weight due to stress. Wt Readings from Last 3 Encounters:  02/08/17 109 lb 12.8 oz (49.8 kg)  05/25/16 114 lb (51.7 kg)  04/08/16 114 lb (51.7 kg)   .    Current Medications:  Current Outpatient Medications on File Prior to Visit  Medication Sig Dispense Refill  . aspirin EC 81 MG tablet Take 1 tablet by mouth. Every other day    .  Calcium-Magnesium-Vitamin D (CALCIUM 500 PO) Take 1 tablet by mouth daily.    . Cholecalciferol (VITAMIN D PO) Take 400 Int'l Units by mouth daily.    . diclofenac (VOLTAREN) 50 MG EC tablet Take 50 mg by mouth 2 (two) times daily.    Marland Kitchen estradiol (ESTRACE) 0.5 MG tablet Take 1 tablet (0.5 mg total) by mouth daily. 90 tablet 4  . medroxyPROGESTERone (PROVERA) 2.5 MG tablet Take 1 tab daily 90 tablet 4  . omeprazole (PRILOSEC) 40 MG capsule Take 1 capsule (40 mg total) by mouth daily. Take at supper meal 30 capsule 2  . oxybutynin (DITROPAN-XL) 10 MG 24 hr tablet Take 1 tablet (10 mg total) by mouth at bedtime. 90 tablet 4  . polyethylene glycol powder (GLYCOLAX/MIRALAX) powder MIX 17 GRAMS AS DIRECTED TO TAKE BY MOUTH TWICE A DAY AS DIRECTED 255 g 3  . rosuvastatin (CRESTOR) 20 MG tablet TAKE 1 TABLET (20 MG TOTAL) BY MOUTH DAILY. 90 tablet 1  . valACYclovir (VALTREX) 1000 MG tablet Take 1 tablet (1,000 mg total) by mouth daily. 90 tablet 4   No current facility-administered medications on file prior to visit.     Health Maintenance:   Immunization History  Administered Date(s) Administered  . DTaP 01/26/2004  . Influenza-Unspecified 10/22/2015, 10/29/2016  . Tdap 01/28/2014  . Zoster 01/26/2007   Tetanus: 2016 Flu vaccine: 2018 Zostavax: 2009 Pap: 2017 Dr. Sabra Heck MGM: 2018 Dr. Sabra Heck at [place] 08/2016 Colonoscopy: 2014 Last Dental Exam:  Dr. Ennis Forts Last Eye Exam: Dr. Earl Gala  Patient Care Team: Unk Pinto, MD as PCP - General (Internal Medicine) Lafayette Dragon, MD (Inactive) as Consulting Physician (Gastroenterology)   Medical History:  Past Medical History:  Diagnosis Date  . Allergy   . Anal fissure   . ANAL FISSURE, HX OF 09/24/2008   Qualifier: Diagnosis of  By: Tamala Julian CMA, Claiborne Billings    . Anxiety   . Fibroids    Uterine  . GERD (gastroesophageal reflux disease)   . HSV-2 (herpes simplex virus 2) infection   . Hyperlipidemia   . IBS (irritable bowel syndrome)    . Interstitial cystitis   . Migraines   . Nevus    vulva nevus  . Prediabetes    Allergies Allergies  Allergen Reactions  . Sulfa Antibiotics Hives, Shortness Of Breath and Rash  . Macrobid [Nitrofurantoin Macrocrystal] Nausea Only    SURGICAL HISTORY Patricia Ryan  has a past surgical history that includes Cholecystectomy; tubal reconstruction; Bladder repair; Tonsillectomy; Vaginal delivery; laparoscopy (1985/ 1990); tubalplasty (1985/ 1990 ); and Bunionectomy. FAMILY HISTORY Her family history includes Breast cancer in her paternal grandmother; Celiac disease in her mother; Diabetes in her paternal grandmother; Heart disease in her father; Irritable bowel syndrome in her sister; Kidney disease in her unknown relative; Lung cancer in her maternal grandmother; Pancreatic cancer in her maternal grandmother; Thyroid disease in her sister. SOCIAL HISTORY Patricia Ryan  reports that Patricia Ryan quit smoking about 5 years ago. Her  smoking use included cigarettes. Patricia Ryan has never used smokeless tobacco. Patricia Ryan reports that Patricia Ryan drinks about 1.2 oz of alcohol per week. Patricia Ryan reports that Patricia Ryan does not use drugs.  Review of Systems: Review of Systems  Constitutional: Negative for chills, fever and malaise/fatigue.  HENT: Negative for congestion, ear pain and sore throat.   Eyes: Negative.   Respiratory: Negative for cough, shortness of breath and wheezing.   Cardiovascular: Negative for chest pain, palpitations and leg swelling.  Gastrointestinal: Negative for abdominal pain, blood in stool, constipation, diarrhea, heartburn and melena.  Genitourinary: Negative.   Skin: Negative.   Neurological: Negative for dizziness, sensory change, loss of consciousness and headaches.  Psychiatric/Behavioral: Negative for depression. The patient is not nervous/anxious and does not have insomnia.     Physical Exam: Estimated body mass index is 19.15 kg/m as calculated from the following:   Height as of this encounter: 5' 3.5" (1.613  m).   Weight as of this encounter: 109 lb 12.8 oz (49.8 kg). BP 110/82   Pulse 68   Temp 97.7 F (36.5 C)   Resp 14   Ht 5' 3.5" (1.613 m)   Wt 109 lb 12.8 oz (49.8 kg)   LMP 01/20/2011   SpO2 95%   BMI 19.15 kg/m   General Appearance: Well nourished well developed, in no apparent distress.  Eyes: PERRLA, EOMs, conjunctiva no swelling or erythema ENT/Mouth: Ear canals normal without obstruction, swelling, erythema, or discharge.  TMs normal bilaterally with no erythema, bulging, retraction, or loss of landmark.  Oropharynx moist and clear with no exudate, erythema, or swelling.   Neck: Supple, thyroid normal. No bruits.  No cervical adenopathy Respiratory: Respiratory effort normal, Breath sounds clear A&P without wheeze, rhonchi, rales.   Cardio: RRR without murmurs, rubs or gallops. Brisk peripheral pulses without edema.  Chest: symmetric, with normal excursions Breasts: Deferred to GYN Abdomen: Soft, nontender, no guarding, rebound, hernias, masses, or organomegaly.  Lymphatics: Non tender without lymphadenopathy.  Musculoskeletal: Full ROM all peripheral extremities,5/5 strength, and normal gait.  Skin: Warm, dry without rashes, lesions, ecchymosis. Neuro: Awake and oriented X 3, Cranial nerves intact, reflexes equal bilaterally. Normal muscle tone, no cerebellar symptoms. Sensation intact.  Psych:  normal affect, Insight and Judgment appropriate.   EKG: WNL no changes.   Over 40 minutes of exam, counseling, chart review and critical decision making was performed  Vicie Mutters 9:20 AM [facility]

## 2017-02-08 ENCOUNTER — Ambulatory Visit: Payer: 59 | Admitting: Physician Assistant

## 2017-02-08 ENCOUNTER — Encounter: Payer: Self-pay | Admitting: Physician Assistant

## 2017-02-08 ENCOUNTER — Encounter: Payer: Self-pay | Admitting: Internal Medicine

## 2017-02-08 VITALS — BP 110/82 | HR 68 | Temp 97.7°F | Resp 14 | Ht 63.5 in | Wt 109.8 lb

## 2017-02-08 DIAGNOSIS — I10 Essential (primary) hypertension: Secondary | ICD-10-CM

## 2017-02-08 DIAGNOSIS — R7303 Prediabetes: Secondary | ICD-10-CM

## 2017-02-08 DIAGNOSIS — R319 Hematuria, unspecified: Secondary | ICD-10-CM

## 2017-02-08 DIAGNOSIS — Z0000 Encounter for general adult medical examination without abnormal findings: Secondary | ICD-10-CM | POA: Diagnosis not present

## 2017-02-08 DIAGNOSIS — E559 Vitamin D deficiency, unspecified: Secondary | ICD-10-CM

## 2017-02-08 DIAGNOSIS — E782 Mixed hyperlipidemia: Secondary | ICD-10-CM

## 2017-02-08 DIAGNOSIS — Z130 Encounter for screening for diseases of the blood and blood-forming organs and certain disorders involving the immune mechanism: Secondary | ICD-10-CM

## 2017-02-08 DIAGNOSIS — B009 Herpesviral infection, unspecified: Secondary | ICD-10-CM

## 2017-02-08 DIAGNOSIS — G43009 Migraine without aura, not intractable, without status migrainosus: Secondary | ICD-10-CM

## 2017-02-08 DIAGNOSIS — Z79899 Other long term (current) drug therapy: Secondary | ICD-10-CM

## 2017-02-08 DIAGNOSIS — Z681 Body mass index (BMI) 19 or less, adult: Secondary | ICD-10-CM

## 2017-02-08 DIAGNOSIS — K5900 Constipation, unspecified: Secondary | ICD-10-CM

## 2017-02-08 DIAGNOSIS — N3010 Interstitial cystitis (chronic) without hematuria: Secondary | ICD-10-CM

## 2017-02-08 DIAGNOSIS — N952 Postmenopausal atrophic vaginitis: Secondary | ICD-10-CM

## 2017-02-08 DIAGNOSIS — Z136 Encounter for screening for cardiovascular disorders: Secondary | ICD-10-CM

## 2017-02-08 DIAGNOSIS — T7840XS Allergy, unspecified, sequela: Secondary | ICD-10-CM

## 2017-02-08 DIAGNOSIS — Z1329 Encounter for screening for other suspected endocrine disorder: Secondary | ICD-10-CM

## 2017-02-08 DIAGNOSIS — Z0001 Encounter for general adult medical examination with abnormal findings: Secondary | ICD-10-CM

## 2017-02-08 DIAGNOSIS — K581 Irritable bowel syndrome with constipation: Secondary | ICD-10-CM

## 2017-02-08 DIAGNOSIS — F411 Generalized anxiety disorder: Secondary | ICD-10-CM

## 2017-02-08 MED ORDER — ESTROGENS, CONJUGATED 0.625 MG/GM VA CREA: 1.0000 | TOPICAL_CREAM | Freq: Every day | VAGINAL | 12 refills | Status: DC

## 2017-02-08 NOTE — Patient Instructions (Addendum)
Premarin do 1/2 a tube (2 grams) every day for 2 weeks and then do 1/2 a tube 2-3 x a week at night.   VAGINAL DRYNESS OVERVIEW  Vaginal dryness, also known as atrophic vaginitis, is a common condition in postmenopausal women. This condition is also common in women who have had both ovaries removed at the time of hysterectomy.   Some women have uncomfortable symptoms of vaginal dryness, such as pain with sex, burning vaginal discomfort or itching, or abnormal vaginal discharge, while others have no symptoms at all.  VAGINAL DRYNESS CAUSES   Estrogen helps to keep the vagina moist and to maintain thickness of the vaginal lining. Vaginal dryness occurs when the ovaries produce a decreased amount of estrogen. This can occur at certain times in a woman's life, and may be permanent or temporary. Times when less estrogen is made include: ?At the time of menopause. ?After surgical removal of the ovaries, chemotherapy, or radiation therapy of the pelvis for cancer. ?After having a baby, particularly in women who breastfeed. ?While using certain medications, such as danazol, medroxyprogesterone (brand names: Provera or DepoProvera), leuprolide (brand name: Lupron), or nafarelin. When these medications are stopped, estrogen production resumes.  Women who smoke cigarettes have been shown to have an increased risk of an earlier menopause transition as compared to non-smokers. Therefore, atrophic vaginitis symptoms may appear at a younger age in this population.  VAGINAL DRYNESS TREATMENT   There are three treatment options for women with vaginal dryness:  Vaginal lubricants and moisturizers - Vaginal lubricants and moisturizers can be purchased without a prescription. These products do not contain any hormones and have virtually no side effects. - Albolene is found in the facial cleanser section at [facility], [facility], or [employer]. It is a large jar with a blue top. This is the best lubricant for women because it  is hypoallergenic. -Natural lubricants, such as olive, avocado or peanut oil, are easily available products that may be used as a lubricant with sex.  -Vaginal moisturizes (eg, Replens, Moist Again, Vagisil, K-Y Silk-E, and Feminease) are formulated to allow water to be retained in the vaginal tissues. Moisturizers are applied into the vagina three times weekly to allow a continued moisturizing effect. These should not be used just before having sex, as they can be irritating.  Vaginal estrogen - Vaginal estrogen is the most effective treatment option for women with vaginal dryness. Vaginal estrogen must be prescribed by a healthcare provider. Very low doses of vaginal estrogen can be used when it is put into the vagina to treat vaginal dryness. A small amount of estrogen is absorbed into the bloodstream, but only about 100 times less than when using estrogen pills or tablets. As a result, there is a much lower risk of side effects, such as blood clots, breast cancer, and heart attack, compared with other estrogen-containing products (birth control pills, menopausal hormone therapy).   Ospemifene - Ospemifene is a prescription medication that is similar to estrogen, but is not estrogen. In the vaginal tissue, it acts similarly to estrogen. In the breast tissue, it acts as an estrogen blocker. It comes in a pill, and is prescribed for women who want to use an estrogen-like medication for vaginal dryness or painful sex associated with vaginal dryness, but prefer not to use a vaginal medication. The medication may cause hot flashes as a side effect. This type of medication may increase the risk of blood clots or uterine cancer. Further study of ospemifene is needed to evaluate  the risk of these complications. This medication has not been tested in women who have had breast cancer or are at a high risk of developing breast cancer.    Sexual activity - Vaginal estrogen improves vaginal dryness quickly, usually  within a few weeks. You may continue to have sex as you treat vaginal dryness because sex itself can help to keep the vaginal tissues healthy. Vaginal intercourse may help the vaginal tissues by keeping them soft and stretchable and preventing the tissues from shrinking.  If sex continues to be painful despite treatment for vaginal dryness, talk to your healthcare provider.   GETTING OFF OF PPI's    Nexium/protonix/prilosec/Omeprazole/Dexilant/Aciphex are called PPI's, they are great at healing your stomach but should only be taken for a short period of time.     Recent studies have shown that taken for a long time they  can increase the risk of osteoporosis (weakening of your bones), pneumonia, low magnesium, restless legs, Cdiff (infection that causes diarrhea), DEMENTIA and most recently kidney damage / disease / insufficiency.     Due to this information we want to try to stop the PPI but if you try to stop it abruptly this can cause rebound acid and worsening symptoms.   So this is how we want you to get off the PPI: Generic is always fine!!  - Start taking the nexium/protonix/prilosec/PPI  every other day with  zantac (ranitidine) OR pepcid famotadine 2 x a day for 2-4 weeks - some people stay on this dosage and can not taper off further. Our main goal is to limit the dosage and amount you are taking so if you need to stay on this dose.   - then decrease the PPI to every 3 days while taking the zantac or pepcid 300mg  twice a day the other  days for 2-4  Weeks  - then you can try the zantac or pepcid 300mg  once at night or up to 2 x day as needed.  - you can continue on this once at night or stop all together  - Avoid alcohol, spicy foods, NSAIDS (aleve, ibuprofen) at this time. See foods below.   +++++++++++++++++++++++++++++++++++++++++++  Food Choices for Gastroesophageal Reflux Disease  When you have gastroesophageal reflux disease (GERD), the foods you eat and your eating  habits are very important. Choosing the right foods can help ease the discomfort of GERD. WHAT GENERAL GUIDELINES DO I NEED TO FOLLOW?  Choose fruits, vegetables, whole grains, low-fat dairy products, and low-fat meat, fish, and poultry.  Limit fats such as oils, salad dressings, butter, nuts, and avocado.  Keep a food diary to identify foods that cause symptoms.  Avoid foods that cause reflux. These may be different for different people.  Eat frequent small meals instead of three large meals each day.  Eat your meals slowly, in a relaxed setting.  Limit fried foods.  Cook foods using methods other than frying.  Avoid drinking alcohol.  Avoid drinking large amounts of liquids with your meals.  Avoid bending over or lying down until 2-3 hours after eating.   WHAT FOODS ARE NOT RECOMMENDED? The following are some foods and drinks that may worsen your symptoms:  Vegetables Tomatoes. Tomato juice. Tomato and spaghetti sauce. Chili peppers. Onion and garlic. Horseradish. Fruits Oranges, grapefruit, and lemon (fruit and juice). Meats High-fat meats, fish, and poultry. This includes hot dogs, ribs, ham, sausage, salami, and bacon. Dairy Whole milk and chocolate milk. Sour cream. Cream. Butter. Ice cream. Cream  cheese.  Beverages Coffee and tea, with or without caffeine. Carbonated beverages or energy drinks. Condiments Hot sauce. Barbecue sauce.  Sweets/Desserts Chocolate and cocoa. Donuts. Peppermint and spearmint. Fats and Oils High-fat foods, including [place] fries and potato chips. Other Vinegar. Strong spices, such as black pepper, white pepper, red pepper, cayenne, curry powder, cloves, ginger, and chili powder.   Tumeric with black pepper extract is a great natural antiinflammatory that helps with arthritis and aches and pain. Can get from [employer] or any health food store. Need to take at least 800mg  twice a day with food.

## 2017-02-09 ENCOUNTER — Telehealth: Payer: Self-pay | Admitting: Physician Assistant

## 2017-02-09 LAB — BASIC METABOLIC PANEL WITH GFR
BUN: 14 mg/dL (ref 7–25)
CO2: 28 mmol/L (ref 20–32)
CREATININE: 0.59 mg/dL (ref 0.50–1.05)
Calcium: 9.9 mg/dL (ref 8.6–10.4)
Chloride: 104 mmol/L (ref 98–110)
GFR, EST AFRICAN AMERICAN: 116 mL/min/{1.73_m2} (ref >=60)
GFR, EST NON AFRICAN AMERICAN: 100 mL/min/{1.73_m2} (ref >=60)
Glucose, Bld: 88 mg/dL (ref 65–99)
Potassium: 4.2 mmol/L (ref 3.5–5.3)
Sodium: 143 mmol/L (ref 135–146)

## 2017-02-09 LAB — MICROALBUMIN / CREATININE URINE RATIO
Creatinine, Urine: 27 mg/dL (ref 20–275)
MICROALB/CREAT RATIO: 7 ug/mg{creat} (ref <30)
Microalb, Ur: 0.2 mg/dL

## 2017-02-09 LAB — HEMOGLOBIN A1C
Hgb A1c MFr Bld: 5.6 % of total Hgb (ref <5.7)
Mean Plasma Glucose: 114 (calc)
eAG (mmol/L): 6.3 (calc)

## 2017-02-09 LAB — IRON, TOTAL/TOTAL IRON BINDING CAP
%SAT: 35 % (ref 11–50)
IRON: 131 ug/dL (ref 45–160)
TIBC: 370 mcg/dL (calc) (ref 250–450)

## 2017-02-09 LAB — CBC WITH DIFFERENTIAL/PLATELET
Basophils Absolute: 60 cells/uL (ref 0–200)
Basophils Relative: 1.2 %
EOS ABS: 60 {cells}/uL (ref 15–500)
Eosinophils Relative: 1.2 %
HEMATOCRIT: 42.8 % (ref 35.0–45.0)
HEMOGLOBIN: 14.4 g/dL (ref 11.7–15.5)
LYMPHS ABS: 1625 {cells}/uL (ref 850–3900)
MCH: 30.7 pg (ref 27.0–33.0)
MCHC: 33.6 g/dL (ref 32.0–36.0)
MCV: 91.3 fL (ref 80.0–100.0)
MPV: 10.2 fL (ref 7.5–12.5)
Monocytes Relative: 10.6 %
Neutro Abs: 2725 cells/uL (ref 1500–7800)
Neutrophils Relative %: 54.5 %
PLATELETS: 272 10*3/uL (ref 140–400)
RBC: 4.69 10*6/uL (ref 3.80–5.10)
RDW: 12.2 % (ref 11.0–15.0)
TOTAL LYMPHOCYTE: 32.5 %
WBC: 5 10*3/uL (ref 3.8–10.8)
WBCMIX: 530 {cells}/uL (ref 200–950)

## 2017-02-09 LAB — HEPATIC FUNCTION PANEL
AG Ratio: 2.1 (calc) (ref 1.0–2.5)
ALT: 12 U/L (ref 6–29)
AST: 15 U/L (ref 10–35)
Albumin: 4.9 g/dL (ref 3.6–5.1)
Alkaline phosphatase (APISO): 55 U/L (ref 33–130)
BILIRUBIN DIRECT: 0.1 mg/dL (ref 0.0–0.2)
BILIRUBIN INDIRECT: 0.3 mg/dL (ref 0.2–1.2)
BILIRUBIN TOTAL: 0.4 mg/dL (ref 0.2–1.2)
GLOBULIN: 2.3 g/dL (ref 1.9–3.7)
Total Protein: 7.2 g/dL (ref 6.1–8.1)

## 2017-02-09 LAB — LIPID PANEL
CHOL/HDL RATIO: 3.7 (calc) (ref <5.0)
Cholesterol: 273 mg/dL — ABNORMAL HIGH (ref <200)
HDL: 74 mg/dL (ref >50)
LDL CHOLESTEROL (CALC): 174 mg/dL — AB
Non-HDL Cholesterol (Calc): 199 mg/dL (calc) — ABNORMAL HIGH (ref <130)
Triglycerides: 118 mg/dL (ref <150)

## 2017-02-09 LAB — MAGNESIUM: MAGNESIUM: 2.1 mg/dL (ref 1.5–2.5)

## 2017-02-09 LAB — URINALYSIS, ROUTINE W REFLEX MICROSCOPIC
Bilirubin Urine: NEGATIVE
Glucose, UA: NEGATIVE
Hgb urine dipstick: NEGATIVE
Ketones, ur: NEGATIVE
LEUKOCYTES UA: NEGATIVE
Nitrite: NEGATIVE
PROTEIN: NEGATIVE
Specific Gravity, Urine: 1.007 (ref 1.001–1.03)
pH: 5.5 (ref 5.0–8.0)

## 2017-02-09 LAB — TSH: TSH: 1.07 mIU/L (ref 0.40–4.50)

## 2017-02-09 LAB — VITAMIN D 25 HYDROXY (VIT D DEFICIENCY, FRACTURES): Vit D, 25-Hydroxy: 39 ng/mL (ref 30–100)

## 2017-02-09 LAB — VITAMIN B12: Vitamin B-12: 359 pg/mL (ref 200–1100)

## 2017-02-09 MED ORDER — ALPRAZOLAM 0.25 MG PO TABS: ORAL_TABLET | ORAL | 0 refills | Status: DC

## 2017-02-09 NOTE — Telephone Encounter (Signed)
-----   Message from [name], [place] sent at 02/09/2017  3:40 PM EST ----- Regarding: REFILL NEEDED Refill needed on: [facility].

## 2017-02-16 ENCOUNTER — Other Ambulatory Visit: Payer: Self-pay | Admitting: Gastroenterology

## 2017-02-16 NOTE — Telephone Encounter (Signed)
Refill request for omeprazole 40 mg one a day. Last seen 02-05-2016.

## 2017-02-16 NOTE — Telephone Encounter (Signed)
Pt has been notified and aware. She will call back to schedule a follow up. 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

## 2017-02-16 NOTE — Telephone Encounter (Signed)
Done. Please have her see me in next couple of months if I am to continue prescribing

## 2017-02-21 ENCOUNTER — Other Ambulatory Visit: Payer: Self-pay | Admitting: Internal Medicine

## 2017-02-28 ENCOUNTER — Other Ambulatory Visit: Payer: Self-pay | Admitting: *Deleted

## 2017-02-28 MED ORDER — POLYETHYLENE GLYCOL 3350 17 GM/SCOOP PO POWD: ORAL | 3 refills | Status: DC

## 2017-03-30 ENCOUNTER — Telehealth: Payer: Self-pay | Admitting: Physician Assistant

## 2017-03-30 MED ORDER — PREDNISONE 20 MG PO TABS: ORAL_TABLET | ORAL | 0 refills | Status: DC

## 2017-03-30 MED ORDER — BENZONATATE 100 MG PO CAPS: 100.0000 mg | ORAL_CAPSULE | Freq: Three times a day (TID) | ORAL | 0 refills | Status: DC | PRN

## 2017-03-30 MED ORDER — ALBUTEROL SULFATE HFA 108 (90 BASE) MCG/ACT IN AERS: 2.0000 | INHALATION_SPRAY | Freq: Four times a day (QID) | RESPIRATORY_TRACT | 1 refills | Status: DC | PRN

## 2017-03-30 NOTE — Telephone Encounter (Signed)
Pt has been made aware of Rx that was sent to pharmacy. March 6th 2019 at 1:53pm

## 2017-03-30 NOTE — Telephone Encounter (Signed)
62 y.o. female calls with 4 days of URI symptoms. She is on cold and cough meds OTC.  She has had sore throat in the AM, cough with mucus yellow, tight chest. No SOB, no wheezing, CP, no fever, chills.     Problem list has Anxiety state; Constipation; IBS (irritable bowel syndrome); Allergy; Migraines; Hyperlipidemia; Fibroids; Prediabetes; HSV-2 infection; and Chronic interstitial cystitis on their problem list.  Medications Current Outpatient Medications on File Prior to Visit  Medication Sig  . aspirin EC 81 MG tablet Take 1 tablet by mouth. Every other day  . Calcium-Magnesium-Vitamin D (CALCIUM 500 PO) Take 1 tablet by mouth daily.  . Cholecalciferol (VITAMIN D PO) Take 400 Int'l Units by mouth daily.  Marland Kitchen conjugated estrogens (PREMARIN) vaginal cream Place 1 Applicatorful vaginally daily.  . diclofenac (VOLTAREN) 50 MG EC tablet Take 50 mg by mouth 2 (two) times daily.  Marland Kitchen estradiol (ESTRACE) 0.5 MG tablet Take 1 tablet (0.5 mg total) by mouth daily.  . medroxyPROGESTERone (PROVERA) 2.5 MG tablet Take 1 tab daily  . omeprazole (PRILOSEC) 40 MG capsule TAKE 1 CAPSULE (40 MG TOTAL) BY MOUTH DAILY. TAKE AT SUPPER MEAL  . oxybutynin (DITROPAN-XL) 10 MG 24 hr tablet Take 1 tablet (10 mg total) by mouth at bedtime.  . polyethylene glycol powder (GLYCOLAX/MIRALAX) powder MIX 17 GRAMS AS DIRECTED TO TAKE BY MOUTH TWICE A DAY AS DIRECTED  . rosuvastatin (CRESTOR) 20 MG tablet TAKE 1 TABLET (20 MG TOTAL) BY MOUTH DAILY.  . valACYclovir (VALTREX) 1000 MG tablet Take 1 tablet (1,000 mg total) by mouth daily.   No current facility-administered medications on file prior to visit.     Allergies  Allergen Reactions  . Sulfa Antibiotics Hives, Shortness Of Breath and Rash  . Macrobid [Nitrofurantoin Macrocrystal] Nausea Only    Since it has only been 4 days, we do not want to send in an antibiotic yet. We want to wait 7-10 days for you body to fight off an infection like a virus. We can treat the  inflammation which will treat the symptoms. If you are not better after 7-10 days we will send in an antibiotic for you.   Get on allergy pill, will send in prednisone, cough pills, albuterol inhaler (20 dollar with [facility]). If not feeling better can get an antibiotic for Saturday, if feels need ABX before that needs OV  If you are not getting better call the office for an office visit. If you are getting worse go to the ER.

## 2017-04-07 DIAGNOSIS — N3010 Interstitial cystitis (chronic) without hematuria: Secondary | ICD-10-CM | POA: Diagnosis not present

## 2017-04-07 DIAGNOSIS — R310 Gross hematuria: Secondary | ICD-10-CM | POA: Diagnosis not present

## 2017-04-20 DIAGNOSIS — M47816 Spondylosis without myelopathy or radiculopathy, lumbar region: Secondary | ICD-10-CM | POA: Diagnosis not present

## 2017-04-20 DIAGNOSIS — M25551 Pain in right hip: Secondary | ICD-10-CM | POA: Diagnosis not present

## 2017-04-20 DIAGNOSIS — M545 Low back pain: Secondary | ICD-10-CM | POA: Diagnosis not present

## 2017-05-05 DIAGNOSIS — N200 Calculus of kidney: Secondary | ICD-10-CM | POA: Diagnosis not present

## 2017-05-05 DIAGNOSIS — N3010 Interstitial cystitis (chronic) without hematuria: Secondary | ICD-10-CM | POA: Diagnosis not present

## 2017-05-05 DIAGNOSIS — R310 Gross hematuria: Secondary | ICD-10-CM | POA: Diagnosis not present

## 2017-05-09 ENCOUNTER — Other Ambulatory Visit: Payer: Self-pay | Admitting: Physician Assistant

## 2017-05-11 ENCOUNTER — Other Ambulatory Visit: Payer: Self-pay | Admitting: *Deleted

## 2017-05-11 NOTE — Telephone Encounter (Signed)
Medication refill request: Estradiol  Last AEX:  05-25-16  Next AEX: 08-25-17 Last MMG (if hormonal medication request): 08-31-16 Normal  ([facility] is sending over results)  Refill authorized: please advise

## 2017-05-12 ENCOUNTER — Encounter: Payer: Self-pay | Admitting: Obstetrics & Gynecology

## 2017-05-12 ENCOUNTER — Other Ambulatory Visit: Payer: Self-pay | Admitting: Obstetrics & Gynecology

## 2017-05-12 MED ORDER — ESTRADIOL 0.5 MG PO TABS: 0.5000 mg | ORAL_TABLET | Freq: Every day | ORAL | 1 refills | Status: DC

## 2017-05-12 NOTE — Telephone Encounter (Signed)
Medication refill request: Oxybutynin 10mg  #90,1R Last AEX:  05-25-16 Next AEX: 08-25-17 Last MMG (if hormonal medication request): 08-29-15 [id number] Refill authorized: Please advise

## 2017-05-25 ENCOUNTER — Other Ambulatory Visit: Payer: Self-pay | Admitting: Gastroenterology

## 2017-05-25 NOTE — Telephone Encounter (Signed)
Refill request for omeprazole 40mg  one a day. Last seen 07-2016.

## 2017-05-30 ENCOUNTER — Telehealth: Payer: Self-pay | Admitting: *Deleted

## 2017-05-30 MED ORDER — VALACYCLOVIR HCL 1 G PO TABS: 1000.0000 mg | ORAL_TABLET | Freq: Every day | ORAL | 0 refills | Status: DC

## 2017-05-30 MED ORDER — OXYBUTYNIN CHLORIDE ER 10 MG PO TB24: 10.0000 mg | ORAL_TABLET | Freq: Every day | ORAL | 0 refills | Status: DC

## 2017-05-30 NOTE — Telephone Encounter (Signed)
Left detailed message at number provided [phone], okay per ROI. Advised rx for Valacyclovir 100 mg daily and Oxybutynin 10 mg daily [facility] [address]. Advised to return call with any further questions.  Routing to provider for final review. Patient agreeable to disposition. Will close encounter.

## 2017-05-30 NOTE — Telephone Encounter (Signed)
Patient has switched her [facility] to [facility] on [address] and would like prescriptions for valacyclovir and oxybutynin 10 mg tabs sent there. Pharmacy number is [phone].

## 2017-06-09 ENCOUNTER — Other Ambulatory Visit: Payer: Self-pay | Admitting: Internal Medicine

## 2017-06-14 DIAGNOSIS — Z87448 Personal history of other diseases of urinary system: Secondary | ICD-10-CM | POA: Diagnosis not present

## 2017-06-14 DIAGNOSIS — N3010 Interstitial cystitis (chronic) without hematuria: Secondary | ICD-10-CM | POA: Diagnosis not present

## 2017-06-14 DIAGNOSIS — N200 Calculus of kidney: Secondary | ICD-10-CM | POA: Insufficient documentation

## 2017-07-26 ENCOUNTER — Other Ambulatory Visit: Payer: Self-pay | Admitting: Obstetrics & Gynecology

## 2017-07-26 NOTE — Telephone Encounter (Signed)
Medication refill request: Provera 2.5 mg Tab  Last AEX:  05/25/16 Next AEX: 12/16/17 Last MMG (if hormonal medication request): 08/31/16 Bi-rads Category 1 neg  Refill authorized: Please approve if appropriate.

## 2017-08-22 ENCOUNTER — Other Ambulatory Visit: Payer: Self-pay | Admitting: Physician Assistant

## 2017-08-25 ENCOUNTER — Encounter

## 2017-08-25 ENCOUNTER — Ambulatory Visit: Payer: 59 | Admitting: Obstetrics & Gynecology

## 2017-09-02 DIAGNOSIS — Z1231 Encounter for screening mammogram for malignant neoplasm of breast: Secondary | ICD-10-CM | POA: Diagnosis not present

## 2017-09-02 LAB — HM MAMMOGRAPHY

## 2017-09-12 DIAGNOSIS — K219 Gastro-esophageal reflux disease without esophagitis: Secondary | ICD-10-CM | POA: Diagnosis not present

## 2017-09-12 DIAGNOSIS — R1084 Generalized abdominal pain: Secondary | ICD-10-CM | POA: Diagnosis not present

## 2017-09-18 ENCOUNTER — Other Ambulatory Visit: Payer: Self-pay | Admitting: Obstetrics & Gynecology

## 2017-09-19 NOTE — Telephone Encounter (Signed)
Medication refill request: ditropan- XL 10mg  Last AEX:  05-25-16 SM  Next AEX: 12-16-17  Last MMG (if hormonal medication request): 09-02-17 density A/BIRADS 1 negative  Refill authorized: 05-30-17 #90, 0RF. Today, please advise.

## 2017-09-22 DIAGNOSIS — R1084 Generalized abdominal pain: Secondary | ICD-10-CM | POA: Diagnosis not present

## 2017-09-25 ENCOUNTER — Other Ambulatory Visit: Payer: Self-pay | Admitting: Obstetrics & Gynecology

## 2017-09-27 NOTE — Telephone Encounter (Signed)
Medication refill request: Valtrex 1000 mg  Last AEX:  05/25/16 Next AEX: 12/16/17 Last MMG (if hormonal medication request): 09/02/17 Bi- rads category 1 neg  Refill authorized: Please refill if appropriate.

## 2017-09-29 ENCOUNTER — Encounter: Payer: Self-pay | Admitting: Internal Medicine

## 2017-09-30 DIAGNOSIS — Z712 Person consulting for explanation of examination or test findings: Secondary | ICD-10-CM | POA: Diagnosis not present

## 2017-09-30 DIAGNOSIS — K219 Gastro-esophageal reflux disease without esophagitis: Secondary | ICD-10-CM | POA: Diagnosis not present

## 2017-10-06 DIAGNOSIS — L720 Epidermal cyst: Secondary | ICD-10-CM | POA: Diagnosis not present

## 2017-10-06 DIAGNOSIS — D225 Melanocytic nevi of trunk: Secondary | ICD-10-CM | POA: Diagnosis not present

## 2017-10-06 DIAGNOSIS — Z808 Family history of malignant neoplasm of other organs or systems: Secondary | ICD-10-CM | POA: Diagnosis not present

## 2017-10-17 DIAGNOSIS — Z23 Encounter for immunization: Secondary | ICD-10-CM | POA: Diagnosis not present

## 2017-11-17 ENCOUNTER — Other Ambulatory Visit: Payer: Self-pay | Admitting: Adult Health

## 2017-11-18 ENCOUNTER — Other Ambulatory Visit: Payer: Self-pay | Admitting: Internal Medicine

## 2017-11-18 MED ORDER — ALPRAZOLAM 0.25 MG PO TABS: ORAL_TABLET | ORAL | 0 refills | Status: DC

## 2017-12-05 DIAGNOSIS — R8290 Unspecified abnormal findings in urine: Secondary | ICD-10-CM | POA: Diagnosis not present

## 2017-12-05 DIAGNOSIS — R599 Enlarged lymph nodes, unspecified: Secondary | ICD-10-CM | POA: Diagnosis not present

## 2017-12-05 DIAGNOSIS — N390 Urinary tract infection, site not specified: Secondary | ICD-10-CM | POA: Diagnosis not present

## 2017-12-05 DIAGNOSIS — R300 Dysuria: Secondary | ICD-10-CM | POA: Diagnosis not present

## 2017-12-06 ENCOUNTER — Other Ambulatory Visit: Payer: Self-pay | Admitting: Obstetrics & Gynecology

## 2017-12-12 NOTE — Progress Notes (Signed)
62 y.o. G2P2 Married White or Caucasian female here for annual exam.  Has experienced issues with intermittent flank pain and gross hematuria that was diagnosed in May.    Denies vaginal bleeding.    Having a lot of issues with vaginal dryness and painful intercourse.    Patient's last menstrual period was 01/20/2011.          Sexually active: No.  The current method of family planning is post menopausal status.    Exercising: Yes.    keeps 3 60y.o and is very busy Smoker:  former  Health Maintenance: Pap:  05/25/16 Normal + HPV but the 16/18/45 testing was negative    04/24/15 Neg: (+) HR HPV; 02/05/13 Neg History of abnormal Pap:  Yes, (+) HR HPV 05/25/16,  (+) HR HPV 04/24/15  MMG:  09/02/17  Density Bi-rads 1 Neg Colonoscopy: 07/12/12 Polyps repeat 10 years   BMD: Never   TDaP:  01/28/14 Pneumonia vaccine(s): no indication Shingrix:   Zoster Hep C testing: several years ago--Neg Screening Labs: PCP   reports that she quit smoking about 5 years ago. Her smoking use included cigarettes. She has never used smokeless tobacco. She reports that she drinks about 5.0 standard drinks of alcohol per week. She reports that she does not use drugs.  Past Medical History:  Diagnosis Date  . Allergy   . Anal fissure   . ANAL FISSURE, HX OF 09/24/2008   Qualifier: Diagnosis of  By: Tamala Julian CMA, Claiborne Billings    . Anxiety   . Fibroids    Uterine  . GERD (gastroesophageal reflux disease)   . HSV-2 (herpes simplex virus 2) infection   . Hyperlipidemia   . IBS (irritable bowel syndrome)   . Interstitial cystitis   . Kidney stone 2019   --Dr.Evans  . Migraines   . Nevus    vulva nevus  . Prediabetes     Past Surgical History:  Procedure Laterality Date  . BLADDER REPAIR    . BUNIONECTOMY    . CHOLECYSTECTOMY    . LAPAROSCOPY  1985/ 1990   due to infertility  . TONSILLECTOMY    . tubal reconstruction    . tubalplasty  1985/ 1990   . VAGINAL DELIVERY     x2    Current Outpatient Medications   Medication Sig Dispense Refill  . ALPRAZolam (XANAX) 0.25 MG tablet Take 1/2-1 tablet 2-3 x /day if needed for Anxiety Attack & limit to 5 days /wk to avoid addiction/will need office visit before next refill 30 tablet 0  . aspirin EC 81 MG tablet Take 1 tablet by mouth. Every other day    . Calcium-Magnesium-Vitamin D (CALCIUM 500 PO) Take 1 tablet by mouth daily.    . Cholecalciferol (VITAMIN D-3 PO) Take 1 capsule by mouth daily.    . diclofenac (VOLTAREN) 50 MG EC tablet Take 50 mg by mouth 2 (two) times daily.    Marland Kitchen estradiol (ESTRACE) 0.5 MG tablet Take 1 tablet (0.5 mg total) by mouth daily. 90 tablet 1  . medroxyPROGESTERone (PROVERA) 2.5 MG tablet TAKE 1 TABLET BY MOUTH EVERY DAY 30 tablet 5  . omeprazole (PRILOSEC) 40 MG capsule TAKE 1 CAPSULE (40 MG TOTAL) BY MOUTH DAILY. TAKE AT SUPPER MEAL 30 capsule 2  . oxybutynin (DITROPAN-XL) 10 MG 24 hr tablet TAKE 1 TABLET BY MOUTH EVERYDAY AT BEDTIME 30 tablet 2  . polyethylene glycol powder (GLYCOLAX/MIRALAX) powder MIX 17 GRAMS AS DIRECTED TO TAKE BY MOUTH TWICE A DAY AS  DIRECTED 255 g 3  . rosuvastatin (CRESTOR) 20 MG tablet TAKE 1 TABLET BY MOUTH EVERY DAY 90 tablet 0  . valACYclovir (VALTREX) 1000 MG tablet TAKE 1 TABLET BY MOUTH EVERY DAY 30 tablet 2   No current facility-administered medications for this visit.     Family History  Problem Relation Age of Onset  . Celiac disease Mother   . Heart disease Father   . Irritable bowel syndrome Sister   . Thyroid disease Sister   . Pancreatic cancer Maternal Grandmother   . Lung cancer Maternal Grandmother   . Diabetes Paternal Grandmother   . Breast cancer Paternal Grandmother   . Kidney disease Unknown        tumor  . Cancer Sister 80       Melanoma  . Heart disease Sister   . Colon cancer Neg Hx     Review of Systems  Gastrointestinal: Positive for constipation.       Bloating  All other systems reviewed and are negative.   Exam:   BP 114/62 (BP Location: Right Arm,  Patient Position: Sitting, Cuff Size: Normal)   Pulse 64   Resp 16   Ht 5' 3.5" (1.613 m)   Wt 109 lb (49.4 kg)   LMP 01/20/2011   BMI 19.01 kg/m    Height: 5' 3.5" (161.3 cm)  Ht Readings from Last 3 Encounters:  12/16/17 5' 3.5" (1.613 m)  02/08/17 5' 3.5" (1.613 m)  05/25/16 5' 3.5" (1.613 m)    General appearance: alert, cooperative and appears stated age Head: Normocephalic, without obvious abnormality, atraumatic Neck: no adenopathy, supple, symmetrical, trachea midline and thyroid normal to inspection and palpation Lungs: clear to auscultation bilaterally Breasts: normal appearance, no masses or tenderness Heart: regular rate and rhythm Abdomen: soft, non-tender; bowel sounds normal; no masses,  no organomegaly Extremities: extremities normal, atraumatic, no cyanosis or edema Skin: Skin color, texture, turgor normal. No rashes or lesions Lymph nodes: Cervical, supraclavicular, and axillary nodes normal. No abnormal inguinal nodes palpated Neurologic: Grossly normal   Pelvic: External genitalia:  no lesions              Urethra:  normal appearing urethra with no masses, tenderness or lesions              Bartholins and Skenes: normal                 Vagina: normal appearing vagina with normal color and discharge, no lesions              Cervix: no lesions              Pap taken: Yes.   Bimanual Exam:  Uterus:  normal size, contour, position, consistency, mobility, non-tender              Adnexa: normal adnexa and no mass, fullness, tenderness               Rectovaginal: Confirms               Anus:  normal sphincter tone, no lesions  Chaperone was present for exam.  A:  Well Woman with normal exam PMP, on HRT Exposure to HPV and genital warts with spouse H/O mildly elevated HbA1C H/o IC, followed by Dr. Amalia Hailey H/O SCC in situ, sees Dr. Delman Cheadle yearly  P:   Mammogram guidelines reviewed pap smear with HR HPV obtained today Lab work will be done in January with  PCP Valtrex 1 gram  daily.  #30/13RF Oxybutynin XL 10mg  daily.  #30/13RF Premarin cream 1/2 gram pv twice  return annually or prn

## 2017-12-13 DIAGNOSIS — R3915 Urgency of urination: Secondary | ICD-10-CM | POA: Diagnosis not present

## 2017-12-13 DIAGNOSIS — N3941 Urge incontinence: Secondary | ICD-10-CM | POA: Diagnosis not present

## 2017-12-13 DIAGNOSIS — R310 Gross hematuria: Secondary | ICD-10-CM | POA: Diagnosis not present

## 2017-12-13 DIAGNOSIS — N200 Calculus of kidney: Secondary | ICD-10-CM | POA: Diagnosis not present

## 2017-12-13 DIAGNOSIS — Z09 Encounter for follow-up examination after completed treatment for conditions other than malignant neoplasm: Secondary | ICD-10-CM | POA: Diagnosis not present

## 2017-12-13 DIAGNOSIS — N3010 Interstitial cystitis (chronic) without hematuria: Secondary | ICD-10-CM | POA: Diagnosis not present

## 2017-12-14 ENCOUNTER — Other Ambulatory Visit: Payer: Self-pay | Admitting: Obstetrics & Gynecology

## 2017-12-14 NOTE — Telephone Encounter (Signed)
Call to patient. Patient states that she has enough valtrex to last her until her appointment on Friday 12-16-17 with Dr. Sabra Heck. Advised would deny request from the pharmacy and Dr. Sabra Heck can refill from aex appointment on Friday. Patient agreeable and appreciative of phone call.   Encounter closed.

## 2017-12-15 DIAGNOSIS — R599 Enlarged lymph nodes, unspecified: Secondary | ICD-10-CM | POA: Diagnosis not present

## 2017-12-16 ENCOUNTER — Encounter

## 2017-12-16 ENCOUNTER — Other Ambulatory Visit: Payer: Self-pay

## 2017-12-16 ENCOUNTER — Encounter: Payer: Self-pay | Admitting: Obstetrics & Gynecology

## 2017-12-16 ENCOUNTER — Other Ambulatory Visit (HOSPITAL_COMMUNITY)
Admission: RE | Admit: 2017-12-16 | Discharge: 2017-12-16 | Disposition: A | Discharge status: Home / Self Care | Payer: 59 | Source: Ambulatory Visit | Attending: Obstetrics & Gynecology | Admitting: Obstetrics & Gynecology

## 2017-12-16 ENCOUNTER — Ambulatory Visit (INDEPENDENT_AMBULATORY_CARE_PROVIDER_SITE_OTHER): Payer: 59 | Admitting: Obstetrics & Gynecology

## 2017-12-16 VITALS — BP 114/62 | HR 64 | Resp 16 | Ht 63.5 in | Wt 109.0 lb

## 2017-12-16 DIAGNOSIS — Z124 Encounter for screening for malignant neoplasm of cervix: Secondary | ICD-10-CM

## 2017-12-16 DIAGNOSIS — Z01419 Encounter for gynecological examination (general) (routine) without abnormal findings: Secondary | ICD-10-CM

## 2017-12-16 MED ORDER — ESTROGENS, CONJUGATED 0.625 MG/GM VA CREA: TOPICAL_CREAM | VAGINAL | 5 refills | Status: DC

## 2017-12-16 MED ORDER — OXYBUTYNIN CHLORIDE ER 10 MG PO TB24: 10.0000 mg | ORAL_TABLET | Freq: Every day | ORAL | 4 refills | Status: DC

## 2017-12-16 MED ORDER — VALACYCLOVIR HCL 1 G PO TABS: 1000.0000 mg | ORAL_TABLET | Freq: Every day | ORAL | 13 refills | Status: DC

## 2017-12-20 LAB — CYTOLOGY - PAP
Diagnosis: NEGATIVE
HPV (WINDOPATH): DETECTED — AB
HPV 16/18/45 GENOTYPING: NEGATIVE

## 2017-12-21 DIAGNOSIS — N390 Urinary tract infection, site not specified: Secondary | ICD-10-CM | POA: Diagnosis not present

## 2017-12-21 DIAGNOSIS — N200 Calculus of kidney: Secondary | ICD-10-CM | POA: Diagnosis not present

## 2017-12-21 DIAGNOSIS — R599 Enlarged lymph nodes, unspecified: Secondary | ICD-10-CM | POA: Diagnosis not present

## 2017-12-28 DIAGNOSIS — N200 Calculus of kidney: Secondary | ICD-10-CM | POA: Diagnosis not present

## 2017-12-30 ENCOUNTER — Other Ambulatory Visit: Payer: Self-pay | Admitting: Internal Medicine

## 2018-01-05 ENCOUNTER — Other Ambulatory Visit: Payer: Self-pay | Admitting: Physician Assistant

## 2018-01-05 DIAGNOSIS — N200 Calculus of kidney: Secondary | ICD-10-CM | POA: Diagnosis not present

## 2018-01-05 DIAGNOSIS — N281 Cyst of kidney, acquired: Secondary | ICD-10-CM | POA: Diagnosis not present

## 2018-01-05 MED ORDER — ALPRAZOLAM 1 MG PO TABS: ORAL_TABLET | ORAL | 0 refills | Status: DC

## 2018-01-09 ENCOUNTER — Telehealth: Payer: Self-pay | Admitting: Obstetrics & Gynecology

## 2018-01-09 NOTE — Telephone Encounter (Signed)
Patient left voicemail over lunch with questions regarding medication changes.

## 2018-01-09 NOTE — Telephone Encounter (Signed)
Spoke with patient. Patient currently taking  Provera 2.5 mg 1/2 tab q other day and estradiol 0.5 mg 1/2 tab q other day for almost 1 month. Also using premarin vaginal cream twice weekly. Patient has been preparing to wean off of HRT, discussed at last AEX 12/16/17. Patient states 01/15/18 will be one month, plans to stop estradiol and provera on this day. Has been doing well with q other day.   Patient confirming instructions and asking if ok to stop both medication on 12/22. Advised ok to stop provera and estradiol on 12/22. Monitor symptoms, continue premarin vaginal cream as instructed. Keep f/u with Dr. Sabra Heck 03/20/18. Return call to office if any concerns. Dr. Sabra Heck will review, I will return call if any additional recommendations.   Routing to provider for final review. Patient is agreeable to disposition. Will close encounter.

## 2018-01-27 DIAGNOSIS — N200 Calculus of kidney: Secondary | ICD-10-CM | POA: Diagnosis not present

## 2018-01-27 DIAGNOSIS — Z09 Encounter for follow-up examination after completed treatment for conditions other than malignant neoplasm: Secondary | ICD-10-CM | POA: Diagnosis not present

## 2018-01-27 DIAGNOSIS — N3010 Interstitial cystitis (chronic) without hematuria: Secondary | ICD-10-CM | POA: Diagnosis not present

## 2018-02-01 DIAGNOSIS — N200 Calculus of kidney: Secondary | ICD-10-CM | POA: Diagnosis not present

## 2018-02-01 DIAGNOSIS — Z09 Encounter for follow-up examination after completed treatment for conditions other than malignant neoplasm: Secondary | ICD-10-CM | POA: Diagnosis not present

## 2018-02-06 NOTE — Progress Notes (Signed)
Complete Physical  Assessment and Plan:  Polyarthralgia With family history and diffuse pain will get some labs Likely OA -     Sedimentation rate -     ANA -     Anti-DNA antibody, double-stranded -     Rheumatoid factor -     Cyclic citrul peptide antibody, IgG -     RNP Antibody  Screening for cardiovascular condition -     EKG 12-Lead  Gastroesophageal reflux disease without esophagitis Continue prilosec, stop nsaids, decrease alcohol  Acute effusion of right ear Continue allergy pill  Other orders -     mirabegron ER (MYRBETRIQ) 50 MG TB24 tablet; Take 1 tablet (50 mg total) by mouth daily. -     ALPRAZolam (XANAX) 1 MG tablet; TAKE 1/2 TABLET BY MOUTH 2-3 TIMES A DAY AS NEEDED FOR ANXIETY ATTACK, LIMIT TO 5 TIMES A WEEK    Mixed hyperlipidemia -continue medications, check lipids, decrease fatty foods, increase activity.  -     Lipid panel -     EKG 12-Lead  Migraine without aura and without status migrainosus, not intractable Avoid triggers  Irritable bowel syndrome with constipation Increase fiber, diet discussed  Constipation, unspecified constipation type Increase fiber, diet discussed  Anxiety state -continue medications, stress management techniques discussed, increase water, good sleep hygiene discussed, increase exercise, and increase veggies.   Chronic interstitial cystitis Continue meds, decrease stress, artifical sugars.   Prediabetes Discussed disease progression and risks Discussed diet/exercise, weight management and risk modification -     Hemoglobin A1c  HSV-2 infection Continue medications  Allergic state, sequela Continue OTC meds  Medication management -     CBC with Differential/Platelet -     BASIC METABOLIC PANEL WITH GFR -     Hepatic function panel -     Magnesium  Screening for thyroid disorder -     TSH  Encounter for general adult medical examination with abnormal findings 1 year  Adult BMI <19 kg/sq m Add on  protein powder, start to work out again.   Hematuria, unspecified type S/p lithotripsy Recheck today  Vaginal atrophy Follow up GYN  Vitamin D deficiency -     VITAMIN D 25 Hydroxy (Vit-D Deficiency, Fractures)  B12 def  Recheck this visit  Discussed med's effects and SE's. Screening labs and tests as requested with regular follow-up as recommended. Future Appointments  Date Time Provider [place]  03/20/2018  4:00 PM Megan Salon, MD [place] None  12/25/2018  3:30 PM Megan Salon, MD [place] None  02/12/2019  9:00 AM Vicie Mutters, PA-C [facility] None    HPI  63 y.o. female  presents for a complete physical.  Her blood pressure has been controlled at home, today their BP is BP: 114/70.  She does workout. She denies chest pain, shortness of breath, dizziness.   Mother has RA and daughter has RA, she complains of bilateral hand, bilateral feet.   She is on cholesterol medication. She is doing 20 mg every other day. Her cholesterol is at goal. The cholesterol last visit was:  Lab Results  Component Value Date   CHOL 273 (H) 02/08/2017   HDL 74 02/08/2017   LDLCALC 174 (H) 02/08/2017   TRIG 118 02/08/2017   CHOLHDL 3.7 02/08/2017   She has been working on diet and exercise for prediabetes, she is on bASA, she is not on ACE/ARB and denies foot ulcerations, hyperglycemia, hypoglycemia , increased appetite, nausea, paresthesia of the feet, polydipsia, polyuria, visual  disturbances, vomiting and weight loss. Last A1C in the office was:  Lab Results  Component Value Date   HGBA1C 5.6 02/08/2017   Patient is on Vitamin D supplement.   Lab Results  Component Value Date   VD25OH 39 02/08/2017     She is following with Dr. Loletha Carrow and she is currently taking omeprazole 40 mg daily.    She is very stressed, keeps her grand babies and another baby 3 days a week, 1 under 2 one is 2 and one is 61 years old. She mainly takes xanax 0.25 but had to get the 1 mg due to  shortage and she can only take this at night and prefers the 0.25mg .   She is on hormone replacement, tried to stop 1 week but had hot flashes so started back on 1/2 every other day. Started on premarin cream x 3 months, helping some but not as much, has follow in Feb, discussed corticosteroid cream but will defer to GYN.   She has had hematuria and recurrent UTI, but had kidney stone s/p lithotripsy and is on the premarin cream and has been better.   BMI is Body mass index is 18.74 kg/m., she is working on diet and exercise. Wt Readings from Last 3 Encounters:  02/08/18 109 lb 3.2 oz (49.5 kg)  12/16/17 109 lb (49.4 kg)  02/08/17 109 lb 12.8 oz (49.8 kg)   .    Current Medications:  Current Outpatient Medications on File Prior to Visit  Medication Sig Dispense Refill  . ALPRAZolam (XANAX) 1 MG tablet TAKE 1/2 TABLET BY MOUTH 2-3 TIMES A DAY AS NEEDED FOR ANXIETY ATTACK, LIMIT TO 5 TIMES A WEEK 30 tablet 0  . Calcium-Magnesium-Vitamin D (CALCIUM 500 PO) Take 1 tablet by mouth daily.    . Cholecalciferol (VITAMIN D-3 PO) Take 1 capsule by mouth daily.    Marland Kitchen conjugated estrogens (PREMARIN) vaginal cream 1/2 gram vaginally twice weekly 30 g 5  . diclofenac (VOLTAREN) 50 MG EC tablet Take 50 mg by mouth 2 (two) times daily.    Marland Kitchen estradiol (ESTRACE) 0.5 MG tablet Take 1 tablet (0.5 mg total) by mouth daily. 90 tablet 1  . medroxyPROGESTERone (PROVERA) 2.5 MG tablet TAKE 1 TABLET BY MOUTH EVERY DAY 30 tablet 5  . omeprazole (PRILOSEC) 40 MG capsule TAKE 1 CAPSULE (40 MG TOTAL) BY MOUTH DAILY. TAKE AT SUPPER MEAL 30 capsule 2  . oxybutynin (DITROPAN-XL) 10 MG 24 hr tablet Take 1 tablet (10 mg total) by mouth at bedtime. 90 tablet 4  . polyethylene glycol powder (GLYCOLAX/MIRALAX) powder MIX 17 GRAMS AS DIRECTED TO TAKE BY MOUTH TWICE A DAY AS DIRECTED 255 g 3  . rosuvastatin (CRESTOR) 20 MG tablet TAKE 1 TABLET BY MOUTH EVERY DAY 90 tablet 0  . valACYclovir (VALTREX) 1000 MG tablet Take 1 tablet  (1,000 mg total) by mouth daily. 30 tablet 13   No current facility-administered medications on file prior to visit.     Health Maintenance:   Immunization History  Administered Date(s) Administered  . DTaP 01/26/2004  . Influenza Inj Mdck Quad Pf 10/29/2016, 10/17/2017  . Influenza-Unspecified 10/22/2015, 10/29/2016, 10/17/2017  . Tdap 01/28/2014  . Zoster 01/26/2007   Tetanus: 2016 Flu vaccine: 2018 Zostavax: 2009 Pap: 2017 Dr. Sabra Heck MGM: 2019 Dr. Sabra Heck at [place] 08/2016 Colonoscopy: 2014 Last Dental Exam:  Dr. Ennis Forts Last Eye Exam: Dr. Earl Gala  Patient Care Team: Windy Canny as PCP - General (Physician Assistant) Lafayette Dragon, MD (  Inactive) as Consulting Physician (Gastroenterology)   Medical History:  Past Medical History:  Diagnosis Date  . Allergy   . Anal fissure   . ANAL FISSURE, HX OF 09/24/2008   Qualifier: Diagnosis of  By: Tamala Julian CMA, Claiborne Billings    . Anxiety   . Fibroids    Uterine  . GERD (gastroesophageal reflux disease)   . HSV-2 (herpes simplex virus 2) infection   . Hyperlipidemia   . IBS (irritable bowel syndrome)   . Interstitial cystitis   . Kidney stone 2019   --Dr.Evans  . Migraines   . Nevus    vulva nevus  . Prediabetes    Allergies Allergies  Allergen Reactions  . Sulfa Antibiotics Hives, Shortness Of Breath and Rash  . Macrobid [Nitrofurantoin Macrocrystal] Nausea Only    SURGICAL HISTORY She  has a past surgical history that includes Cholecystectomy; tubal reconstruction; Bladder repair; Tonsillectomy; Vaginal delivery; laparoscopy (1985/ 1990); tubalplasty (1985/ 1990 ); and Bunionectomy. FAMILY HISTORY Her family history includes Breast cancer in her paternal grandmother; Cancer (age of onset: 8) in her sister; Celiac disease in her mother; Diabetes in her paternal grandmother; Heart disease in her father and sister; Irritable bowel syndrome in her sister; Kidney disease in her unknown relative; Lung cancer in her  maternal grandmother; Pancreatic cancer in her maternal grandmother; Thyroid disease in her sister. SOCIAL HISTORY She  reports that she quit smoking about 6 years ago. Her smoking use included cigarettes. She has never used smokeless tobacco. She reports current alcohol use of about 5.0 standard drinks of alcohol per week. She reports that she does not use drugs.  Review of Systems: Review of Systems  Constitutional: Negative for chills, fever and malaise/fatigue.  HENT: Negative for congestion, ear pain and sore throat.   Eyes: Negative.   Respiratory: Negative for cough, shortness of breath and wheezing.   Cardiovascular: Negative for chest pain, palpitations and leg swelling.  Gastrointestinal: Negative for abdominal pain, blood in stool, constipation, diarrhea, heartburn and melena.  Genitourinary: Negative.   Skin: Negative.   Neurological: Negative for dizziness, sensory change, loss of consciousness and headaches.  Psychiatric/Behavioral: Negative for depression. The patient is not nervous/anxious and does not have insomnia.     Physical Exam: Estimated body mass index is 18.74 kg/m as calculated from the following:   Height as of this encounter: 5\' 4"  (1.626 m).   Weight as of this encounter: 109 lb 3.2 oz (49.5 kg). BP 114/70   Pulse 72   Temp 98.2 F (36.8 C)   Ht 5\' 4"  (1.626 m)   Wt 109 lb 3.2 oz (49.5 kg)   LMP 01/20/2011   SpO2 98%   BMI 18.74 kg/m   General Appearance: Well nourished well developed, in no apparent distress.  Eyes: PERRLA, EOMs, conjunctiva no swelling or erythema ENT/Mouth: Ear canals normal without obstruction, swelling, erythema, or discharge.  TMs normal bilaterally with no erythema, bulging, retraction, or loss of landmark.  Oropharynx moist and clear with no exudate, erythema, or swelling.   Neck: Supple, thyroid normal. No bruits.  No cervical adenopathy Respiratory: Respiratory effort normal, Breath sounds clear A&P without wheeze,  rhonchi, rales.   Cardio: RRR without murmurs, rubs or gallops. Brisk peripheral pulses without edema.  Chest: symmetric, with normal excursions Breasts: Deferred to GYN Abdomen: Soft, nontender, no guarding, rebound, hernias, masses, or organomegaly.  Lymphatics: Non tender without lymphadenopathy.  Musculoskeletal: Full ROM all peripheral extremities,5/5 strength, and normal gait.  Skin: Warm, dry without rashes,  lesions, ecchymosis. Neuro: Awake and oriented X 3, Cranial nerves intact, reflexes equal bilaterally. Normal muscle tone, no cerebellar symptoms. Sensation intact.  Psych:  normal affect, Insight and Judgment appropriate.   EKG: WNL no changes.   Over 40 minutes of exam, counseling, chart review and critical decision making was performed  Vicie Mutters 9:17 AM [facility]

## 2018-02-08 ENCOUNTER — Ambulatory Visit: Payer: 59 | Admitting: Physician Assistant

## 2018-02-08 ENCOUNTER — Encounter: Payer: Self-pay | Admitting: Physician Assistant

## 2018-02-08 VITALS — BP 114/70 | HR 72 | Temp 98.2°F | Ht 64.0 in | Wt 109.2 lb

## 2018-02-08 DIAGNOSIS — E559 Vitamin D deficiency, unspecified: Secondary | ICD-10-CM | POA: Diagnosis not present

## 2018-02-08 DIAGNOSIS — I10 Essential (primary) hypertension: Secondary | ICD-10-CM

## 2018-02-08 DIAGNOSIS — K219 Gastro-esophageal reflux disease without esophagitis: Secondary | ICD-10-CM

## 2018-02-08 DIAGNOSIS — E782 Mixed hyperlipidemia: Secondary | ICD-10-CM | POA: Diagnosis not present

## 2018-02-08 DIAGNOSIS — Z79899 Other long term (current) drug therapy: Secondary | ICD-10-CM | POA: Diagnosis not present

## 2018-02-08 DIAGNOSIS — Z0000 Encounter for general adult medical examination without abnormal findings: Secondary | ICD-10-CM

## 2018-02-08 DIAGNOSIS — H65191 Other acute nonsuppurative otitis media, right ear: Secondary | ICD-10-CM

## 2018-02-08 DIAGNOSIS — Z130 Encounter for screening for diseases of the blood and blood-forming organs and certain disorders involving the immune mechanism: Secondary | ICD-10-CM

## 2018-02-08 DIAGNOSIS — M2550 Pain in unspecified joint: Secondary | ICD-10-CM

## 2018-02-08 DIAGNOSIS — Z136 Encounter for screening for cardiovascular disorders: Secondary | ICD-10-CM

## 2018-02-08 DIAGNOSIS — Z0001 Encounter for general adult medical examination with abnormal findings: Secondary | ICD-10-CM

## 2018-02-08 DIAGNOSIS — Z1389 Encounter for screening for other disorder: Secondary | ICD-10-CM

## 2018-02-08 DIAGNOSIS — Z1329 Encounter for screening for other suspected endocrine disorder: Secondary | ICD-10-CM

## 2018-02-08 MED ORDER — MIRABEGRON ER 50 MG PO TB24: 50.0000 mg | ORAL_TABLET | Freq: Every day | ORAL | 5 refills | Status: DC

## 2018-02-08 MED ORDER — ALPRAZOLAM 1 MG PO TABS: ORAL_TABLET | ORAL | 2 refills | Status: DC

## 2018-02-08 NOTE — Patient Instructions (Addendum)
Fatty liver or Nonalcoholic fatty liver disease (NASH) is now the leading cause of liver failure in the [place]. It is normally from such risk factors as obesity, diabetes, insulin resistance, high cholesterol, or metabolic syndrome. The only definitive therapy is weight loss and exercise.  Suggest walking 20-30 mins daily.  Decreasing carbohydrates, increasing veggies.  Vitamin E 800 IU a day may be beneficial. Liver cancer has been noted in patient with fatty liver without cirrhosis.  Will monitor closely   Fatty Liver Fatty liver is the accumulation of fat in liver cells. It is also called hepatosteatosis or steatohepatitis. It is normal for your liver to contain some fat. If fat is more than 5 to 10% of your liver's weight, you have fatty liver.  There are often no symptoms (problems) for years while damage is still occurring. People often learn about their fatty liver when they have medical tests for other reasons. Fat can damage your liver for years or even decades without causing problems. When it becomes severe, it can cause fatigue, weight loss, weakness, and confusion. This makes you more likely to develop more serious liver problems. The liver is the largest organ in the body. It does a lot of work and often gives no warning signs when it is sick until late in a disease. The liver has many important jobs including:  Breaking down foods.  Storing vitamins, iron, and other minerals.  Making proteins.  Making bile for food digestion.  Breaking down many products including medications, alcohol and some poisons.  PROGNOSIS  Fatty liver may cause no damage or it can lead to an inflammation of the liver. This is, called steatohepatitis.  Over time the liver may become scarred and hardened. This condition is called cirrhosis. Cirrhosis is serious and may lead to liver failure or cancer. NASH is one of the leading causes of cirrhosis. About 10-20% of Americans have fatty liver and a  smaller 2-5% has NASH.  TREATMENT   Weight loss, fat restriction, and exercise in overweight patients produces inconsistent results but is worth trying.  Good control of diabetes may reduce fatty liver.  Eat a balanced, healthy diet.  Increase your physical activity.  There are no medical or surgical treatments for a fatty liver or NASH, but improving your diet and increasing your exercise may help prevent or reverse some of the damage.   Your ears and sinuses are connected by the eustachian tube. When your sinuses are inflamed, this can close off the tube and cause fluid to collect in your middle ear. This can then cause dizziness, popping, clicking, ringing, and echoing in your ears. This is often NOT an infection and does NOT require antibiotics, it is caused by inflammation so the treatments help the inflammation. This can take a long time to get better so please be patient.  Here are things you can do to help with this: - Try the Flonase or Nasonex. Remember to spray each nostril twice towards the outer part of your eye.  Do not sniff but instead pinch your nose and tilt your head back to help the medicine get into your sinuses.  The best time to do this is at bedtime.Stop if you get blurred vision or nose bleeds.  -While drinking fluids, pinch and hold nose close and swallow, to help open eustachian tubes to drain fluid behind ear drums. -Please pick one of the over the counter allergy medications below and take it once daily for allergies.  It will  also help with fluid behind ear drums. Claritin or loratadine cheapest but likely the weakest  Zyrtec or certizine at night because it can make you sleepy The strongest is allegra or fexafinadine  Cheapest at [employer], [employer], [employer] -can use decongestant over the counter, please do not use if you have high blood pressure or certain heart conditions.   if worsening HA, changes vision/speech, imbalance, weakness go to the ER   GETTING OFF  OF PPI's    Nexium/protonix/prilosec/Omeprazole/Dexilant/Aciphex are called PPI's, they are great at healing your stomach but should only be taken for a short period of time.     Recent studies have shown that taken for a long time they  can increase the risk of osteoporosis (weakening of your bones), pneumonia, low magnesium, restless legs, Cdiff (infection that causes diarrhea), DEMENTIA and most recently kidney damage / disease / insufficiency.     Due to this information we want to try to stop the PPI but if you try to stop it abruptly this can cause rebound acid and worsening symptoms.   So this is how we want you to get off the PPI: Generic is always fine!!  - Start taking the nexium/protonix/prilosec/PPI  every other day with  zantac (ranitidine) OR pepcid famotadine 2 x a day for 2-4 weeks - some people stay on this dosage and can not taper off further. Our main goal is to limit the dosage and amount you are taking so if you need to stay on this dose.   - then decrease the PPI to every 3 days while taking the zantac or pepcid 300mg  twice a day the other  days for 2-4  Weeks  - then you can try the zantac or pepcid 300mg  once at night or up to 2 x day as needed.  - you can continue on this once at night or stop all together  - Avoid alcohol, spicy foods, NSAIDS (aleve, ibuprofen) at this time. See foods below.   +++++++++++++++++++++++++++++++++++++++++++  Food Choices for Gastroesophageal Reflux Disease  When you have gastroesophageal reflux disease (GERD), the foods you eat and your eating habits are very important. Choosing the right foods can help ease the discomfort of GERD. WHAT GENERAL GUIDELINES DO I NEED TO FOLLOW?  Choose fruits, vegetables, whole grains, low-fat dairy products, and low-fat meat, fish, and poultry.  Limit fats such as oils, salad dressings, butter, nuts, and avocado.  Keep a food diary to identify foods that cause symptoms.  Avoid foods that cause  reflux. These may be different for different people.  Eat frequent small meals instead of three large meals each day.  Eat your meals slowly, in a relaxed setting.  Limit fried foods.  Cook foods using methods other than frying.  Avoid drinking alcohol.  Avoid drinking large amounts of liquids with your meals.  Avoid bending over or lying down until 2-3 hours after eating.   WHAT FOODS ARE NOT RECOMMENDED? The following are some foods and drinks that may worsen your symptoms:  Vegetables Tomatoes. Tomato juice. Tomato and spaghetti sauce. Chili peppers. Onion and garlic. Horseradish. Fruits Oranges, grapefruit, and lemon (fruit and juice). Meats High-fat meats, fish, and poultry. This includes hot dogs, ribs, ham, sausage, salami, and bacon. Dairy Whole milk and chocolate milk. Sour cream. Cream. Butter. Ice cream. Cream cheese.  Beverages Coffee and tea, with or without caffeine. Carbonated beverages or energy drinks. Condiments Hot sauce. Barbecue sauce.  Sweets/Desserts Chocolate and cocoa. Donuts. Peppermint and spearmint. Fats and  Oils High-fat foods, including [place] fries and potato chips. Other Vinegar. Strong spices, such as black pepper, white pepper, red pepper, cayenne, curry powder, cloves, ginger, and chili powder.  Magnesium low add 250 mg with food to prevent diarrhea. Magnesium may help with muscle cramps, constipation, vitamin D and potassium absorption.   Ways to prevent diarrhea with magnesium:  1) Don't take all your magnesium at the same time, have 2-3 smaller doses through out the day 2) Try taking your magnesium with high fiber meals.  3) If this does not help, take the magnesium on an empty stomach. Fiber for some people can bind the magnesium too well and prevent absorption in your gut.  4) Lastly try different types of magnesium. Most people are taking magnesium citrate, you can also try dimalate capsules which are slow release. You can also  find magnesium lotions/sprays for the skin that bypass the gut. Another one that has good absorption is ReMag (pico-iconic magnesium formula), this has great cellular absorption so less of a laxative effect. You can find these type at health food stores or online.

## 2018-02-10 LAB — COMPLETE METABOLIC PANEL WITH GFR
AG RATIO: 2.2 (calc) (ref 1.0–2.5)
ALBUMIN MSPROF: 4.3 g/dL (ref 3.6–5.1)
ALT: 13 U/L (ref 6–29)
AST: 16 U/L (ref 10–35)
Alkaline phosphatase (APISO): 56 U/L (ref 33–130)
BILIRUBIN TOTAL: 0.5 mg/dL (ref 0.2–1.2)
BUN: 11 mg/dL (ref 7–25)
CALCIUM: 9.6 mg/dL (ref 8.6–10.4)
CHLORIDE: 106 mmol/L (ref 98–110)
CO2: 29 mmol/L (ref 20–32)
Creat: 0.69 mg/dL (ref 0.50–0.99)
GFR, EST AFRICAN AMERICAN: 110 mL/min/{1.73_m2} (ref >=60)
GFR, EST NON AFRICAN AMERICAN: 95 mL/min/{1.73_m2} (ref >=60)
Globulin: 2 g/dL (calc) (ref 1.9–3.7)
Glucose, Bld: 98 mg/dL (ref 65–99)
POTASSIUM: 4.3 mmol/L (ref 3.5–5.3)
Sodium: 142 mmol/L (ref 135–146)
TOTAL PROTEIN: 6.3 g/dL (ref 6.1–8.1)

## 2018-02-10 LAB — CBC WITH DIFFERENTIAL/PLATELET
ABSOLUTE MONOCYTES: 364 {cells}/uL (ref 200–950)
Basophils Absolute: 49 cells/uL (ref 0–200)
Basophils Relative: 1.4 %
EOS ABS: 102 {cells}/uL (ref 15–500)
Eosinophils Relative: 2.9 %
HCT: 39.8 % (ref 35.0–45.0)
Hemoglobin: 13.4 g/dL (ref 11.7–15.5)
Lymphs Abs: 1236 cells/uL (ref 850–3900)
MCH: 30.4 pg (ref 27.0–33.0)
MCHC: 33.7 g/dL (ref 32.0–36.0)
MCV: 90.2 fL (ref 80.0–100.0)
MPV: 10.2 fL (ref 7.5–12.5)
Monocytes Relative: 10.4 %
NEUTROS PCT: 50 %
Neutro Abs: 1750 cells/uL (ref 1500–7800)
PLATELETS: 239 10*3/uL (ref 140–400)
RBC: 4.41 10*6/uL (ref 3.80–5.10)
RDW: 11.9 % (ref 11.0–15.0)
Total Lymphocyte: 35.3 %
WBC: 3.5 10*3/uL — ABNORMAL LOW (ref 3.8–10.8)

## 2018-02-10 LAB — URINALYSIS, ROUTINE W REFLEX MICROSCOPIC
Bilirubin Urine: NEGATIVE
GLUCOSE, UA: NEGATIVE
Hgb urine dipstick: NEGATIVE
Ketones, ur: NEGATIVE
Leukocytes, UA: NEGATIVE
Nitrite: NEGATIVE
Protein, ur: NEGATIVE
Specific Gravity, Urine: 1.016 (ref 1.001–1.03)
pH: <=5 (ref 5.0–8.0)

## 2018-02-10 LAB — SEDIMENTATION RATE: Sed Rate: 2 mm/h (ref 0–30)

## 2018-02-10 LAB — ANTI-NUCLEAR AB-TITER (ANA TITER)
ANA TITER: 1:320 {titer} — ABNORMAL HIGH
ANA Titer 1: 1:320 {titer} — ABNORMAL HIGH

## 2018-02-10 LAB — LIPID PANEL
Cholesterol: 172 mg/dL (ref <200)
HDL: 57 mg/dL (ref >50)
LDL CHOLESTEROL (CALC): 98 mg/dL
NON-HDL CHOLESTEROL (CALC): 115 mg/dL (ref <130)
TRIGLYCERIDES: 77 mg/dL (ref <150)
Total CHOL/HDL Ratio: 3 (calc) (ref <5.0)

## 2018-02-10 LAB — RHEUMATOID FACTOR: Rheumatoid fact SerPl-aCnc: <14 IU/mL (ref <14)

## 2018-02-10 LAB — ANA: Anti Nuclear Antibody(ANA): POSITIVE — AB

## 2018-02-10 LAB — MICROALBUMIN / CREATININE URINE RATIO
Creatinine, Urine: 111 mg/dL (ref 20–275)
Microalb Creat Ratio: 5 mcg/mg creat (ref <30)
Microalb, Ur: 0.6 mg/dL

## 2018-02-10 LAB — CYCLIC CITRUL PEPTIDE ANTIBODY, IGG

## 2018-02-10 LAB — RNP ANTIBODY: RIBONUCLEIC PROTEIN(ENA) ANTIBODY, IGG: NEGATIVE AI

## 2018-02-10 LAB — VITAMIN D 25 HYDROXY (VIT D DEFICIENCY, FRACTURES): Vit D, 25-Hydroxy: 44 ng/mL (ref 30–100)

## 2018-02-10 LAB — VITAMIN B12: Vitamin B-12: 513 pg/mL (ref 200–1100)

## 2018-02-10 LAB — TSH: TSH: 0.17 mIU/L — ABNORMAL LOW (ref 0.40–4.50)

## 2018-02-10 LAB — ANTI-DNA ANTIBODY, DOUBLE-STRANDED: ds DNA Ab: <1 IU/mL

## 2018-02-10 LAB — MAGNESIUM: Magnesium: 1.9 mg/dL (ref 1.5–2.5)

## 2018-02-16 ENCOUNTER — Encounter: Payer: Self-pay | Admitting: Internal Medicine

## 2018-02-23 ENCOUNTER — Other Ambulatory Visit: Payer: Self-pay | Admitting: Adult Health

## 2018-03-14 ENCOUNTER — Other Ambulatory Visit (INDEPENDENT_AMBULATORY_CARE_PROVIDER_SITE_OTHER): Payer: 59

## 2018-03-14 DIAGNOSIS — Z1211 Encounter for screening for malignant neoplasm of colon: Secondary | ICD-10-CM | POA: Diagnosis not present

## 2018-03-14 DIAGNOSIS — Z1212 Encounter for screening for malignant neoplasm of rectum: Principal | ICD-10-CM

## 2018-03-14 LAB — POC HEMOCCULT BLD/STL (HOME/3-CARD/SCREEN)
Card #2 Fecal Occult Blod, POC: NEGATIVE
Card #3 Fecal Occult Blood, POC: NEGATIVE
Fecal Occult Blood, POC: NEGATIVE

## 2018-03-15 DIAGNOSIS — Z1211 Encounter for screening for malignant neoplasm of colon: Secondary | ICD-10-CM | POA: Diagnosis not present

## 2018-03-20 ENCOUNTER — Other Ambulatory Visit: Payer: Self-pay

## 2018-03-20 ENCOUNTER — Ambulatory Visit: Payer: 59 | Admitting: Obstetrics & Gynecology

## 2018-03-20 ENCOUNTER — Encounter: Payer: Self-pay | Admitting: Obstetrics & Gynecology

## 2018-03-20 VITALS — BP 116/70 | HR 64 | Resp 14 | Ht 64.0 in | Wt 106.6 lb

## 2018-03-20 DIAGNOSIS — N898 Other specified noninflammatory disorders of vagina: Secondary | ICD-10-CM

## 2018-03-20 DIAGNOSIS — Z79890 Hormone replacement therapy (postmenopausal): Secondary | ICD-10-CM

## 2018-03-20 DIAGNOSIS — N952 Postmenopausal atrophic vaginitis: Secondary | ICD-10-CM

## 2018-03-20 MED ORDER — MEDROXYPROGESTERONE ACETATE 2.5 MG PO TABS: ORAL_TABLET | ORAL | 5 refills | Status: DC

## 2018-03-20 MED ORDER — ESTRADIOL 0.5 MG PO TABS: 0.5000 mg | ORAL_TABLET | Freq: Every day | ORAL | 6 refills | Status: DC

## 2018-03-20 NOTE — Progress Notes (Signed)
GYNECOLOGY  VISIT  CC:   Vaginal dryness  HPI: 63 y.o. G2P2 Married White or Caucasian female here for follow up Premarin use.  Feels the vaginal tissue is much improved and there is less pain/dryness.    She has felt some itching/irritation the last two days.  Things she may have a little vaginal discharge as well.  Denies vaginal bleedng.   Did try to stop her HRT.  She had a lot of hot flashes and sweats when stopped completely.  These symptoms were severe enough that she is back taking every other day.  She is taking 0.5mg  1/2 tab estradiol and 2.5mg  1/2 MPA every other day.    GYNECOLOGIC HISTORY: Patient's last menstrual period was 01/20/2011. Contraception: PMP Menopausal hormone therapy: none  Patient Active Problem List   Diagnosis Date Noted  . 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 IBS (irritable bowel syndrome)   . Allergy   . Migraines   . Hyperlipidemia   . Fibroids   . Prediabetes   . Chronic interstitial cystitis 11/08/2012  . Constipation 02/06/2010  . Anxiety state 09/24/2008    Past Medical History:  Diagnosis Date  . Allergy   . Anal fissure   . ANAL FISSURE, HX OF 09/24/2008   Qualifier: Diagnosis of  By: Tamala Julian CMA, Claiborne Billings    . Anxiety   . Fibroids    Uterine  . GERD (gastroesophageal reflux disease)   . HSV-2 (herpes simplex virus 2) infection   . Hyperlipidemia   . IBS (irritable bowel syndrome)   . Interstitial cystitis   . Kidney stone 2019   --Dr.Evans  . Migraines   . Nevus    vulva nevus  . Prediabetes     Past Surgical History:  Procedure Laterality Date  . BLADDER REPAIR    . BUNIONECTOMY    . CHOLECYSTECTOMY    . LAPAROSCOPY  1985/ 1990   due to infertility  . TONSILLECTOMY    . tubal reconstruction    . tubalplasty  1985/ 1990   . VAGINAL DELIVERY     x2    MEDS:   Current Outpatient Medications on File Prior to Visit  Medication Sig Dispense Refill  . ALPRAZolam (XANAX) 1 MG tablet TAKE 1/2 TABLET BY MOUTH 2-3 TIMES A DAY AS  NEEDED FOR ANXIETY ATTACK, LIMIT TO 5 TIMES A WEEK 30 tablet 2  . Calcium-Magnesium-Vitamin D (CALCIUM 500 PO) Take 1 tablet by mouth daily.    . Cholecalciferol (VITAMIN D-3 PO) Take 1 capsule by mouth daily.    Marland Kitchen conjugated estrogens (PREMARIN) vaginal cream 1/2 gram vaginally twice weekly 30 g 5  . diclofenac (VOLTAREN) 50 MG EC tablet Take 50 mg by mouth 2 (two) times daily.    Marland Kitchen estradiol (ESTRACE) 0.5 MG tablet Take 1 tablet (0.5 mg total) by mouth daily. 90 tablet 1  . medroxyPROGESTERone (PROVERA) 2.5 MG tablet TAKE 1 TABLET BY MOUTH EVERY DAY 30 tablet 5  . omeprazole (PRILOSEC) 40 MG capsule TAKE 1 CAPSULE (40 MG TOTAL) BY MOUTH DAILY. TAKE AT SUPPER MEAL 30 capsule 2  . oxybutynin (DITROPAN-XL) 10 MG 24 hr tablet Take 1 tablet (10 mg total) by mouth at bedtime. 90 tablet 4  . polyethylene glycol powder (GLYCOLAX/MIRALAX) powder MIX 17 GRAMS AS DIRECTED TO TAKE BY MOUTH TWICE A DAY AS DIRECTED 255 g 3  . rosuvastatin (CRESTOR) 20 MG tablet TAKE 1 TABLET BY MOUTH EVERY DAY 30 tablet 2  . valACYclovir (VALTREX) 1000 MG tablet Take  1 tablet (1,000 mg total) by mouth daily. 30 tablet 13   No current facility-administered medications on file prior to visit.     ALLERGIES: Sulfa antibiotics and Macrobid [nitrofurantoin macrocrystal]  Family History  Problem Relation Age of Onset  . Celiac disease Mother   . Heart disease Father   . Irritable bowel syndrome Sister   . Thyroid disease Sister   . Pancreatic cancer Maternal Grandmother   . Lung cancer Maternal Grandmother   . Diabetes Paternal Grandmother   . Breast cancer Paternal Grandmother   . Kidney disease Unknown        tumor  . Cancer Sister 57       Melanoma  . Heart disease Sister   . Colon cancer Neg Hx     SH:  Married, non smoker  Review of Systems  All other systems reviewed and are negative.   PHYSICAL EXAMINATION:    BP 116/70 (BP Location: Left Arm, Patient Position: Sitting, Cuff Size: Normal)   Pulse 64    Resp 14   Ht 5\' 4"  (1.626 m)   Wt 106 lb 9.6 oz (48.4 kg)   LMP 01/20/2011   BMI 18.30 kg/m     General appearance: alert, cooperative and appears stated age Abdomen: soft, non-tender; bowel sounds normal; no masses,  no organomegaly Lymph:  no inguinal LAD noted  Pelvic: External genitalia:  no lesions              Urethra:  normal appearing urethra with no masses, tenderness or lesions              Bartholins and Skenes: normal                 Vagina: normal appearing vagina with normal color and discharge, no lesions              Cervix: no lesions  Chaperone was present for exam.  Assessment: Vaginal atrophy improved with premarin vaginal cream Vaginal/vulvar irritation/itching On HRT but has lowered dosage  Plan: RF for estradiol 0.5mg  1/2 tab every other day and provera 2.5mg  1/2 tab every other day.  Rx to pharmacy. Affirm pending She does not need rx for Premarin vaginal cream at this time.

## 2018-03-21 LAB — VAGINITIS/VAGINOSIS, DNA PROBE
Candida Species: NEGATIVE
Gardnerella vaginalis: NEGATIVE
Trichomonas vaginosis: NEGATIVE

## 2018-03-30 DIAGNOSIS — S93492A Sprain of other ligament of left ankle, initial encounter: Secondary | ICD-10-CM | POA: Diagnosis not present

## 2018-04-10 ENCOUNTER — Telehealth (INDEPENDENT_AMBULATORY_CARE_PROVIDER_SITE_OTHER): Payer: 59 | Admitting: Physician Assistant

## 2018-04-10 DIAGNOSIS — R350 Frequency of micturition: Secondary | ICD-10-CM | POA: Diagnosis not present

## 2018-04-10 MED ORDER — CIPROFLOXACIN HCL 500 MG PO TABS: 500.0000 mg | ORAL_TABLET | Freq: Two times a day (BID) | ORAL | 0 refills | Status: DC

## 2018-04-10 MED ORDER — FLUCONAZOLE 150 MG PO TABS: ORAL_TABLET | ORAL | 3 refills | Status: DC

## 2018-04-10 NOTE — Telephone Encounter (Signed)
The patient has been notified of this information and all questions answered.

## 2018-04-10 NOTE — Telephone Encounter (Signed)
Patient has symptoms of a UTI. She has burning, frequency, has history of UTI. Unable to make it into the office. Will send in cipro, patient is allergic to sulfa/macrobid.   If continuing symptoms at 24-48 hours needs office visit.

## 2018-04-10 NOTE — Telephone Encounter (Signed)
-----   Message from [name], [place] sent at 04/10/2018 11:23 AM EDT ----- Regarding: poss UTI Contact: [phone] Pt unable to come in due to babysitting today.  PER YELLOW OFFICE NOTE:   Pt reports   Patient is pretty sure she has an UTI. Would like to know if the office could call something into her pharmacy.    Patricia Ryan

## 2018-04-12 DIAGNOSIS — S93492D Sprain of other ligament of left ankle, subsequent encounter: Secondary | ICD-10-CM | POA: Diagnosis not present

## 2018-05-15 NOTE — Progress Notes (Signed)
Folllow up  Assessment and Plan:  DIZZINESS Get on bASA Will go to the ER if worsening headache, changes vision/speech, imbalance, weakness. Likely more sinuses, get on nasal spray  Mixed hyperlipidemia -continue medications, check lipids, decrease fatty foods, increase activity.  -     Lipid panel  Migraine without aura and without status migrainosus, not intractable Avoid triggers  Abnormal glucose Discussed disease progression and risks Discussed diet/exercise, weight management and risk modification -     Hemoglobin A1c  Medication management -     CBC with Differential/Platelet -     BASIC METABOLIC PANEL WITH GFR -     Hepatic function panel -     Magnesium  Adult BMI <19 kg/sq m Add on protein powder, start to work out again.   Vitamin D deficiency -     VITAMIN D 25 Hydroxy (Vit-D Deficiency, Fractures)  B12 def  Recheck this visit  Discussed med's effects and SE's. Screening labs and tests as requested with regular follow-up as recommended. Future Appointments  Date Time Provider [place]  08/21/2018  4:00 PM Vicie Mutters, [place] [facility] None  12/25/2018  3:30 PM Megan Salon, MD [place] None  02/12/2019  9:00 AM Vicie Mutters, PA-C [facility] None    HPI  63 y.o. female  presents for a complete physical.  Her blood pressure has been controlled at home, today their BP is BP: 118/64.  She does workout. She denies chest pain, shortness of breath.   She has had last week has had sensations of movement with movement in the kitchen, happen randomly, last seconds, no other symptoms with it. She has had sinus congestion/allergies, was taking sudafed a few weeks ago, felt cracking popping in her ears and echoing.  She denies any associated neurological complications or symptoms, such as one-sided weakness, numbness, tingling, slurring of speech, droopy face, swallowing difficulties, diplopia, vision loss, hearing loss or tinnitus. No fever, chills,  CP, SOB.   She is on cholesterol medication. She is doing 10 mg every other day. Her cholesterol is at goal. The cholesterol last visit was:  Lab Results  Component Value Date   CHOL 172 02/08/2018   HDL 57 02/08/2018   LDLCALC 98 02/08/2018   TRIG 77 02/08/2018   CHOLHDL 3.0 02/08/2018   She has been working on diet and exercise for prediabetes, she is on bASA, she is not on ACE/ARB and denies foot ulcerations, hyperglycemia, hypoglycemia , increased appetite, nausea, paresthesia of the feet, polydipsia, polyuria, visual disturbances, vomiting and weight loss. Last A1C in the office was:  Lab Results  Component Value Date   HGBA1C 5.6 02/08/2017   Patient is on Vitamin D supplement.   Lab Results  Component Value Date   VD25OH 44 02/08/2018     She is following with Dr. Loletha Carrow and she is currently taking omeprazole 40 mg daily.     She mainly takes xanax 0.25 but had to get the 1 mg due to shortage and she can only take this at night and prefers the 0.25mg .   She is on hormone replacement, tried to stop 1 week but had hot flashes so started back on 1/2 every other day. Started on premarin cream.    She has had hematuria and recurrent UTI, but had kidney stone s/p lithotripsy and is on the premarin cream and has been better.   BMI is Body mass index is 19.05 kg/m., she is working on diet and exercise. Wt Readings from Last 3  Encounters:  05/17/18 111 lb (50.3 kg)  03/20/18 106 lb 9.6 oz (48.4 kg)  02/08/18 109 lb 3.2 oz (49.5 kg)   .    Current Medications:   Current Outpatient Medications (Endocrine & Metabolic):  .  estradiol (ESTRACE) 0.5 MG tablet, Take 1 tablet (0.5 mg total) by mouth daily. .  medroxyPROGESTERone (PROVERA) 2.5 MG tablet, TAKE 1 TABLET BY MOUTH EVERY DAY  Current Outpatient Medications (Cardiovascular):  .  rosuvastatin (CRESTOR) 20 MG tablet, TAKE 1 TABLET BY MOUTH EVERY DAY   Current Outpatient Medications (Analgesics):  .  diclofenac (VOLTAREN)  50 MG EC tablet, Take 50 mg by mouth 2 (two) times daily.  Current Outpatient Medications (Hematological):  Marland Kitchen  Cyanocobalamin (B-12 PO), Take by mouth.  Current Outpatient Medications (Other):  Marland Kitchen  ALPRAZolam (XANAX) 1 MG tablet, TAKE 1/2 TABLET BY MOUTH 2-3 TIMES A DAY AS NEEDED FOR ANXIETY ATTACK, LIMIT TO 5 TIMES A WEEK .  Calcium-Magnesium-Vitamin D (CALCIUM 500 PO), Take 1 tablet by mouth daily. .  Cholecalciferol (VITAMIN D-3 PO), Take 1 capsule by mouth daily. Marland Kitchen  conjugated estrogens (PREMARIN) vaginal cream, 1/2 gram vaginally twice weekly .  omeprazole (PRILOSEC) 40 MG capsule, TAKE 1 CAPSULE (40 MG TOTAL) BY MOUTH DAILY. TAKE AT SUPPER MEAL .  oxybutynin (DITROPAN-XL) 10 MG 24 hr tablet, Take 1 tablet (10 mg total) by mouth at bedtime. .  polyethylene glycol powder (GLYCOLAX/MIRALAX) powder, MIX 17 GRAMS AS DIRECTED TO TAKE BY MOUTH TWICE A DAY AS DIRECTED .  valACYclovir (VALTREX) 1000 MG tablet, Take 1 tablet (1,000 mg total) by mouth daily.  Medical History:  Past Medical History:  Diagnosis Date  . Allergy   . Anal fissure   . ANAL FISSURE, HX OF 09/24/2008   Qualifier: Diagnosis of  By: Tamala Julian CMA, Claiborne Billings    . Anxiety   . Fibroids    Uterine  . GERD (gastroesophageal reflux disease)   . HSV-2 (herpes simplex virus 2) infection   . Hyperlipidemia   . IBS (irritable bowel syndrome)   . Interstitial cystitis   . Kidney stone 2019   --Dr.Evans  . Migraines   . Nevus    vulva nevus  . Prediabetes    Allergies Allergies  Allergen Reactions  . Sulfa Antibiotics Hives, Shortness Of Breath and Rash  . Macrobid [Nitrofurantoin Macrocrystal] Nausea Only    SURGICAL HISTORY She  has a past surgical history that includes Cholecystectomy; tubal reconstruction; Bladder repair; Tonsillectomy; Vaginal delivery; laparoscopy (1985/ 1990); tubalplasty (1985/ 1990 ); and Bunionectomy. FAMILY HISTORY Her family history includes Breast cancer in her paternal grandmother; Cancer  (age of onset: 48) in her sister; Celiac disease in her mother; Diabetes in her paternal grandmother; Heart disease in her father and sister; Irritable bowel syndrome in her sister; Kidney disease in her unknown relative; Lung cancer in her maternal grandmother; Pancreatic cancer in her maternal grandmother; Thyroid disease in her sister. SOCIAL HISTORY She  reports that she quit smoking about 6 years ago. Her smoking use included cigarettes. She has never used smokeless tobacco. She reports current alcohol use of about 5.0 standard drinks of alcohol per week. She reports that she does not use drugs.  Review of Systems: Review of Systems  Constitutional: Negative for chills, fever and malaise/fatigue.  HENT: Negative for congestion, ear pain and sore throat.   Eyes: Negative.   Respiratory: Negative for cough, shortness of breath and wheezing.   Cardiovascular: Negative for chest pain, palpitations and leg swelling.  Gastrointestinal: Negative for abdominal pain, blood in stool, constipation, diarrhea, heartburn and melena.  Genitourinary: Negative.   Skin: Negative.   Neurological: Negative for dizziness, sensory change, loss of consciousness and headaches.  Psychiatric/Behavioral: Negative for depression. The patient is not nervous/anxious and does not have insomnia.     Physical Exam: Estimated body mass index is 19.05 kg/m as calculated from the following:   Height as of this encounter: 5\' 4"  (1.626 m).   Weight as of this encounter: 111 lb (50.3 kg). BP 118/64   Temp 97.6 F (36.4 C)   Ht 5\' 4"  (1.626 m)   Wt 111 lb (50.3 kg)   LMP 01/20/2011   SpO2 97%   BMI 19.05 kg/m   General Appearance: Well nourished well developed, in no apparent distress.  Eyes: PERRLA, EOMs, conjunctiva no swelling or erythema ENT/Mouth: Ear canals normal without obstruction, swelling, erythema, or discharge.  TMs normal bilaterally with no erythema, bulging, retraction, or loss of landmark.   Oropharynx moist and clear with no exudate, erythema, or swelling.   Neck: Supple, thyroid normal. No bruits.  No cervical adenopathy Respiratory: Respiratory effort normal, Breath sounds clear A&P without wheeze, rhonchi, rales.   Cardio: RRR without murmurs, rubs or gallops. Brisk peripheral pulses without edema.  Chest: symmetric, with normal excursions Abdomen: Soft, nontender, no guarding, rebound, hernias, masses, or organomegaly.  Lymphatics: Non tender without lymphadenopathy.  Musculoskeletal: Full ROM all peripheral extremities,5/5 strength, and normal gait.  Skin: Warm, dry without rashes, lesions, ecchymosis. Neuro: Awake and oriented X 3, Cranial nerves intact, reflexes equal bilaterally. Normal muscle tone, no cerebellar symptoms. Sensation intact.  Psych:  normal affect, Insight and Judgment appropriate.   Vicie Mutters 3:07 PM [facility]

## 2018-05-17 ENCOUNTER — Ambulatory Visit: Payer: 59 | Admitting: Physician Assistant

## 2018-05-17 ENCOUNTER — Encounter: Payer: Self-pay | Admitting: Physician Assistant

## 2018-05-17 ENCOUNTER — Other Ambulatory Visit: Payer: Self-pay

## 2018-05-17 ENCOUNTER — Ambulatory Visit: Payer: Self-pay | Admitting: Physician Assistant

## 2018-05-17 VITALS — BP 118/64 | Temp 97.6°F | Ht 64.0 in | Wt 111.0 lb

## 2018-05-17 DIAGNOSIS — E348 Other specified endocrine disorders: Secondary | ICD-10-CM

## 2018-05-17 DIAGNOSIS — Z79899 Other long term (current) drug therapy: Secondary | ICD-10-CM

## 2018-05-17 DIAGNOSIS — R7309 Other abnormal glucose: Secondary | ICD-10-CM | POA: Diagnosis not present

## 2018-05-17 DIAGNOSIS — E559 Vitamin D deficiency, unspecified: Secondary | ICD-10-CM

## 2018-05-17 DIAGNOSIS — G43009 Migraine without aura, not intractable, without status migrainosus: Secondary | ICD-10-CM

## 2018-05-17 DIAGNOSIS — E782 Mixed hyperlipidemia: Secondary | ICD-10-CM | POA: Diagnosis not present

## 2018-05-17 DIAGNOSIS — E538 Deficiency of other specified B group vitamins: Secondary | ICD-10-CM

## 2018-05-17 NOTE — Patient Instructions (Signed)
Add ENTERIC COATED low dose 81 mg Aspirin daily OR can do every other day if you have easy bruising to protect your heart and head. As well as to reduce risk of Colon Cancer by 40 %, Skin Cancer by 26 % , Melanoma by 46% and Pancreatic cancer by 60%  Your ears and sinuses are connected by the eustachian tube. When your sinuses are inflamed, this can close off the tube and cause fluid to collect in your middle ear. This can then cause dizziness, popping, clicking, ringing, and echoing in your ears. This is often NOT an infection and does NOT require antibiotics, it is caused by inflammation so the treatments help the inflammation. This can take a long time to get better so please be patient.  Here are things you can do to help with this: - Try the Flonase or Nasonex. Remember to spray each nostril twice towards the outer part of your eye.  Do not sniff but instead pinch your nose and tilt your head back to help the medicine get into your sinuses.  The best time to do this is at bedtime.Stop if you get blurred vision or nose bleeds.  -While drinking fluids, pinch and hold nose close and swallow, to help open eustachian tubes to drain fluid behind ear drums. -Please pick one of the over the counter allergy medications below and take it once daily for allergies.  It will also help with fluid behind ear drums. Claritin or loratadine cheapest but likely the weakest  Zyrtec or certizine at night because it can make you sleepy The strongest is allegra or fexafinadine  Cheapest at [facility], [employer], [employer] -can use decongestant over the counter, please do not use if you have high blood pressure or certain heart conditions.   if worsening HA, changes vision/speech, imbalance, weakness go to the ER    Vertigo  Vertigo means that you feel like you are moving when you are not. Vertigo can also make you feel like things around you are moving when they are not. This feeling can come and go at any time. Vertigo  often goes away on its own. Follow these instructions at home:  Avoid making fast movements.  Avoid driving.  Avoid using heavy machinery.  Avoid doing any task or activity that might cause danger to you or other people if you would have a vertigo attack while you are doing it.  Sit down right away if you feel dizzy or have trouble with your balance.  Take over-the-counter and prescription medicines only as told by your doctor.  Follow instructions from your doctor about which positions or movements you should avoid.  Drink enough fluid to keep your pee (urine) clear or pale yellow.  Keep all follow-up visits as told by your doctor. This is important. Contact a doctor if:  Medicine does not help your vertigo.  You have a fever.  Your problems get worse or you have new symptoms.  Your family or friends see changes in your behavior.  You feel sick to your stomach (nauseous) or you throw up (vomit).  You have a "pins and needles" feeling or you are numb in part of your body. Get help right away if:  You have trouble moving or talking.  You are always dizzy.  You pass out (faint).  You get very bad headaches.  You feel weak or have trouble using your hands, arms, or legs.  You have changes in your hearing.  You have changes in your  seeing (vision).  You get a stiff neck.  Bright light starts to bother you. This information is not intended to replace advice given to you by your health care provider. Make sure you discuss any questions you have with your health care provider. Document Released: 10/21/2007 Document Revised: 06/19/2015 Document Reviewed: 05/06/2014 Elsevier Interactive Patient Education  [employer].

## 2018-05-18 ENCOUNTER — Other Ambulatory Visit: Payer: Self-pay | Admitting: Physician Assistant

## 2018-05-18 DIAGNOSIS — E2839 Other primary ovarian failure: Secondary | ICD-10-CM

## 2018-05-18 LAB — CBC WITH DIFFERENTIAL/PLATELET
Absolute Monocytes: 502 cells/uL (ref 200–950)
Basophils Absolute: 51 cells/uL (ref 0–200)
Basophils Relative: 0.9 %
Eosinophils Absolute: 143 cells/uL (ref 15–500)
Eosinophils Relative: 2.5 %
HCT: 39.6 % (ref 35.0–45.0)
Hemoglobin: 13.3 g/dL (ref 11.7–15.5)
Lymphs Abs: 1568 cells/uL (ref 850–3900)
MCH: 30.1 pg (ref 27.0–33.0)
MCHC: 33.6 g/dL (ref 32.0–36.0)
MCV: 89.6 fL (ref 80.0–100.0)
MPV: 10.6 fL (ref 7.5–12.5)
Monocytes Relative: 8.8 %
Neutro Abs: 3437 cells/uL (ref 1500–7800)
Neutrophils Relative %: 60.3 %
Platelets: 224 10*3/uL (ref 140–400)
RBC: 4.42 10*6/uL (ref 3.80–5.10)
RDW: 12.2 % (ref 11.0–15.0)
Total Lymphocyte: 27.5 %
WBC: 5.7 10*3/uL (ref 3.8–10.8)

## 2018-05-18 LAB — LIPID PANEL
Cholesterol: 173 mg/dL (ref <200)
HDL: 58 mg/dL (ref >=50)
LDL Cholesterol (Calc): 96 mg/dL (calc)
Non-HDL Cholesterol (Calc): 115 mg/dL (calc) (ref <130)
Total CHOL/HDL Ratio: 3 (calc) (ref <5.0)
Triglycerides: 94 mg/dL (ref <150)

## 2018-05-18 LAB — COMPLETE METABOLIC PANEL WITH GFR
AG Ratio: 2.1 (calc) (ref 1.0–2.5)
ALT: 12 U/L (ref 6–29)
AST: 16 U/L (ref 10–35)
Albumin: 4.4 g/dL (ref 3.6–5.1)
Alkaline phosphatase (APISO): 62 U/L (ref 37–153)
BUN: 17 mg/dL (ref 7–25)
CO2: 28 mmol/L (ref 20–32)
Calcium: 10 mg/dL (ref 8.6–10.4)
Chloride: 107 mmol/L (ref 98–110)
Creat: 0.68 mg/dL (ref 0.50–0.99)
GFR, Est African American: 110 mL/min/{1.73_m2} (ref >=60)
GFR, Est Non African American: 95 mL/min/{1.73_m2} (ref >=60)
Globulin: 2.1 g/dL (calc) (ref 1.9–3.7)
Glucose, Bld: 93 mg/dL (ref 65–99)
Potassium: 4.4 mmol/L (ref 3.5–5.3)
Sodium: 142 mmol/L (ref 135–146)
Total Bilirubin: 0.3 mg/dL (ref 0.2–1.2)
Total Protein: 6.5 g/dL (ref 6.1–8.1)

## 2018-05-18 LAB — VITAMIN D 25 HYDROXY (VIT D DEFICIENCY, FRACTURES): Vit D, 25-Hydroxy: 59 ng/mL (ref 30–100)

## 2018-05-18 LAB — TSH: TSH: 0.6 mIU/L (ref 0.40–4.50)

## 2018-05-18 LAB — VITAMIN B12: Vitamin B-12: 1247 pg/mL — ABNORMAL HIGH (ref 200–1100)

## 2018-05-18 LAB — MAGNESIUM: Magnesium: 2 mg/dL (ref 1.5–2.5)

## 2018-08-09 ENCOUNTER — Ambulatory Visit: Payer: Self-pay | Admitting: Physician Assistant

## 2018-08-18 NOTE — Progress Notes (Signed)
Folllow up  THIS ENCOUNTER IS A VIRTUAL/TELEPHONE VISIT DUE TO COVID-19 - PATIENT WAS NOT SEEN IN THE OFFICE.  PATIENT HAS CONSENTED TO VIRTUAL VISIT / TELEMEDICINE VISIT  This provider placed a call to [name] using telephone, her appointment was changed to a virtual office visit to reduce the risk of exposure to the COVID-19 virus and to help [name] remain healthy and safe. The virtual visit will also provide continuity of care. She verbalizes understanding.   PATIENT WILL GET COVID TEST, WAS AT [facility]  Assessment and Plan:  WEAKNESS PRILOSEC DAILY GET RETESTED QUARANTINE IF NEGATIVE COVID WILL GET LAB ONLY  Mixed hyperlipidemia -continue medications, check lipids, decrease fatty foods, increase activity.   Migraine without aura and without status migrainosus, not intractable Avoid triggers  Abnormal glucose Discussed disease progression and risks Discussed diet/exercise, weight management and risk modification  Adult BMI <19 kg/sq m Add on protein powder, start to work out again.   Vitamin D deficiency  B12 def  Recheck this visit   Discussed med's effects and SE's. Screening labs and tests as requested with regular follow-up as recommended. Future Appointments  Date Time Provider [place]  12/25/2018  3:30 PM Megan Salon, MD [place] None  02/12/2019  9:00 AM Vicie Mutters, PA-C [id number] None    HPI  63 y.o. female  presents for a complete physical.  Her blood pressure has been controlled at home, today their BP is  .  She does workout. She denies chest pain, shortness of breath.   Patient was + 7/2, she was getting better, went to [facility] July 12 and went to an event and then now she is feeling the same with throat dry burning, weakness x last night. Feels similar. No cough this time, no fevers, did not have before.  She has only been taking her omeprazole every other day.   She is on cholesterol medication. She is doing 10  mg every other day. Her cholesterol is at goal. The cholesterol last visit was:  Lab Results  Component Value Date   CHOL 173 05/17/2018   HDL 58 05/17/2018   LDLCALC 96 05/17/2018   TRIG 94 05/17/2018   CHOLHDL 3.0 05/17/2018   She has been working on diet and exercise for prediabetes, she is on bASA, she is not on ACE/ARB and denies foot ulcerations, hyperglycemia, hypoglycemia , increased appetite, nausea, paresthesia of the feet, polydipsia, polyuria, visual disturbances, vomiting and weight loss. Last A1C in the office was:  Lab Results  Component Value Date   HGBA1C 5.6 02/08/2017   Patient is on Vitamin D supplement.   Lab Results  Component Value Date   VD25OH 69 05/17/2018     She is following with Dr. Loletha Carrow and she is currently taking omeprazole 40 mg daily but only taking every other day.      She mainly takes xanax 0.25 but had to get the 1 mg due to shortage and she can only take this at night and prefers the 0.25mg .   She is on hormone replacement, tried to stop 1 week but had hot flashes so started back on 1/2 every other day. Started on premarin cream.    BMI is Body mass index is 18.4 kg/m., she is working on diet and exercise. Wt Readings from Last 3 Encounters:  08/21/18 107 lb 3.2 oz (48.6 kg)  05/17/18 111 lb (50.3 kg)  03/20/18 106 lb 9.6 oz (48.4 kg)   .  Current Medications:   Current Outpatient Medications (Endocrine & Metabolic):  .  estradiol (ESTRACE) 0.5 MG tablet, Take 1 tablet (0.5 mg total) by mouth daily. .  medroxyPROGESTERone (PROVERA) 2.5 MG tablet, TAKE 1 TABLET BY MOUTH EVERY DAY  Current Outpatient Medications (Cardiovascular):  .  rosuvastatin (CRESTOR) 20 MG tablet, Take 1 tablet Daily for Cholesterol   Current Outpatient Medications (Analgesics):  .  diclofenac (VOLTAREN) 50 MG EC tablet, Take 50 mg by mouth 2 (two) times daily.  Current Outpatient Medications (Hematological):  Marland Kitchen  Cyanocobalamin (B-12 PO), Take by  mouth.  Current Outpatient Medications (Other):  Marland Kitchen  ALPRAZolam (XANAX) 1 MG tablet, TAKE 1/2 TABLET BY MOUTH 2-3 TIMES A DAY AS NEEDED FOR ANXIETY ATTACK, LIMIT TO 5 TIMES A WEEK .  Calcium-Magnesium-Vitamin D (CALCIUM 500 PO), Take 1 tablet by mouth daily. .  Cholecalciferol (VITAMIN D-3 PO), Take 1 capsule by mouth daily. Marland Kitchen  conjugated estrogens (PREMARIN) vaginal cream, 1/2 gram vaginally twice weekly .  omeprazole (PRILOSEC) 40 MG capsule, TAKE 1 CAPSULE (40 MG TOTAL) BY MOUTH DAILY. TAKE AT SUPPER MEAL .  oxybutynin (DITROPAN-XL) 10 MG 24 hr tablet, Take 1 tablet (10 mg total) by mouth at bedtime. .  polyethylene glycol powder (GLYCOLAX/MIRALAX) powder, MIX 17 GRAMS AS DIRECTED TO TAKE BY MOUTH TWICE A DAY AS DIRECTED .  valACYclovir (VALTREX) 1000 MG tablet, Take 1 tablet (1,000 mg total) by mouth daily.  Medical History:  Past Medical History:  Diagnosis Date  . Allergy   . Anal fissure   . ANAL FISSURE, HX OF 09/24/2008   Qualifier: Diagnosis of  By: Tamala Julian CMA, Claiborne Billings    . Anxiety   . Fibroids    Uterine  . GERD (gastroesophageal reflux disease)   . HSV-2 (herpes simplex virus 2) infection   . Hyperlipidemia   . IBS (irritable bowel syndrome)   . Interstitial cystitis   . Kidney stone 2019   --Dr.Evans  . Migraines   . Nevus    vulva nevus  . Prediabetes    Allergies Allergies  Allergen Reactions  . Sulfa Antibiotics Hives, Shortness Of Breath and Rash  . Macrobid [Nitrofurantoin Macrocrystal] Nausea Only    SURGICAL HISTORY She  has a past surgical history that includes Cholecystectomy; tubal reconstruction; Bladder repair; Tonsillectomy; Vaginal delivery; laparoscopy (1985/ 1990); tubalplasty (1985/ 1990 ); and Bunionectomy. FAMILY HISTORY Her family history includes Breast cancer in her paternal grandmother; Cancer (age of onset: 41) in her sister; Celiac disease in her mother; Diabetes in her paternal grandmother; Heart disease in her father and sister; Irritable  bowel syndrome in her sister; Kidney disease in her unknown relative; Lung cancer in her maternal grandmother; Pancreatic cancer in her maternal grandmother; Thyroid disease in her sister. SOCIAL HISTORY She  reports that she quit smoking about 6 years ago. Her smoking use included cigarettes. She has never used smokeless tobacco. She reports current alcohol use of about 5.0 standard drinks of alcohol per week. She reports that she does not use drugs.  Review of Systems: Review of Systems  Constitutional: Positive for malaise/fatigue. Negative for chills and fever.  HENT: Positive for sore throat. Negative for congestion and ear pain.   Eyes: Negative.   Respiratory: Negative for cough, shortness of breath and wheezing.   Cardiovascular: Negative for chest pain, palpitations and leg swelling.  Gastrointestinal: Positive for heartburn. Negative for abdominal pain, blood in stool, constipation, diarrhea, melena, nausea and vomiting.  Genitourinary: Negative.   Musculoskeletal: Positive for myalgias.  Skin: Negative.   Neurological: Negative for dizziness, sensory change, loss of consciousness and headaches.  Psychiatric/Behavioral: Negative for depression. The patient is not nervous/anxious and does not have insomnia.     Physical Exam: Estimated body mass index is 18.4 kg/m as calculated from the following:   Height as of 05/17/18: 5\' 4"  (1.626 m).   Weight as of this encounter: 107 lb 3.2 oz (48.6 kg). Wt 107 lb 3.2 oz (48.6 kg)   LMP 01/20/2011   BMI 18.40 kg/m   General Appearance:Well sounding, in no apparent distress.  ENT/Mouth: No hoarseness, No cough for duration of visit.  Respiratory: completing full sentences without distress, without audible wheeze Neuro: Awake and oriented X 3,  Psych:  Insight and Judgment appropriate.    Vicie Mutters 4:17 PM [facility]

## 2018-08-20 ENCOUNTER — Other Ambulatory Visit: Payer: Self-pay | Admitting: Adult Health

## 2018-08-21 ENCOUNTER — Other Ambulatory Visit: Payer: Self-pay

## 2018-08-21 ENCOUNTER — Encounter: Payer: Self-pay | Admitting: Physician Assistant

## 2018-08-21 ENCOUNTER — Ambulatory Visit: Payer: 59 | Admitting: Physician Assistant

## 2018-08-21 VITALS — Wt 107.2 lb

## 2018-08-21 DIAGNOSIS — T7840XS Allergy, unspecified, sequela: Secondary | ICD-10-CM

## 2018-08-21 DIAGNOSIS — E782 Mixed hyperlipidemia: Secondary | ICD-10-CM | POA: Diagnosis not present

## 2018-08-21 DIAGNOSIS — R7309 Other abnormal glucose: Secondary | ICD-10-CM

## 2018-08-21 MED ORDER — OMEPRAZOLE 40 MG PO CPDR: 40.0000 mg | DELAYED_RELEASE_CAPSULE | Freq: Every day | ORAL | 2 refills | Status: DC

## 2018-08-21 NOTE — Patient Instructions (Signed)
The testing sites are open from 8-4, Monday-Friday. Due to the testing being walk-up/drive-up the sites request that the pt's are in line to have testing by 3:30 pm. The pt's will remain in the car and wear a mask when going for testing.The staff will come to the car to perform testing. The pt's will need to bring an ID and insurance card if they have one.   [place]  [address]   The testing sites are also listed on the [employer] as well.

## 2018-08-22 ENCOUNTER — Other Ambulatory Visit: Payer: Self-pay

## 2018-08-22 DIAGNOSIS — Z20822 Contact with and (suspected) exposure to covid-19: Secondary | ICD-10-CM

## 2018-08-24 LAB — NOVEL CORONAVIRUS, NAA: SARS-CoV-2, NAA: NOT DETECTED

## 2018-08-29 ENCOUNTER — Other Ambulatory Visit: Payer: 59

## 2018-08-29 ENCOUNTER — Other Ambulatory Visit: Payer: Self-pay

## 2018-08-29 DIAGNOSIS — E782 Mixed hyperlipidemia: Secondary | ICD-10-CM

## 2018-08-29 DIAGNOSIS — R7309 Other abnormal glucose: Secondary | ICD-10-CM

## 2018-08-29 DIAGNOSIS — Z79899 Other long term (current) drug therapy: Secondary | ICD-10-CM

## 2018-08-29 DIAGNOSIS — E559 Vitamin D deficiency, unspecified: Secondary | ICD-10-CM

## 2018-08-29 DIAGNOSIS — E538 Deficiency of other specified B group vitamins: Secondary | ICD-10-CM

## 2018-08-30 LAB — CBC WITH DIFFERENTIAL/PLATELET
Absolute Monocytes: 495 cells/uL (ref 200–950)
Basophils Absolute: 60 cells/uL (ref 0–200)
Basophils Relative: 1.2 %
Eosinophils Absolute: 220 cells/uL (ref 15–500)
Eosinophils Relative: 4.4 %
HCT: 36.9 % (ref 35.0–45.0)
Hemoglobin: 12.3 g/dL (ref 11.7–15.5)
Lymphs Abs: 1690 cells/uL (ref 850–3900)
MCH: 30.1 pg (ref 27.0–33.0)
MCHC: 33.3 g/dL (ref 32.0–36.0)
MCV: 90.2 fL (ref 80.0–100.0)
MPV: 10.6 fL (ref 7.5–12.5)
Monocytes Relative: 9.9 %
Neutro Abs: 2535 cells/uL (ref 1500–7800)
Neutrophils Relative %: 50.7 %
Platelets: 207 10*3/uL (ref 140–400)
RBC: 4.09 10*6/uL (ref 3.80–5.10)
RDW: 12.6 % (ref 11.0–15.0)
Total Lymphocyte: 33.8 %
WBC: 5 10*3/uL (ref 3.8–10.8)

## 2018-08-30 LAB — TSH: TSH: 0.93 mIU/L (ref 0.40–4.50)

## 2018-08-30 LAB — COMPLETE METABOLIC PANEL WITH GFR
AG Ratio: 2 (calc) (ref 1.0–2.5)
ALT: 13 U/L (ref 6–29)
AST: 16 U/L (ref 10–35)
Albumin: 4.4 g/dL (ref 3.6–5.1)
Alkaline phosphatase (APISO): 64 U/L (ref 37–153)
BUN: 15 mg/dL (ref 7–25)
CO2: 29 mmol/L (ref 20–32)
Calcium: 9.8 mg/dL (ref 8.6–10.4)
Chloride: 107 mmol/L (ref 98–110)
Creat: 0.67 mg/dL (ref 0.50–0.99)
GFR, Est African American: 110 mL/min/{1.73_m2} (ref >=60)
GFR, Est Non African American: 95 mL/min/{1.73_m2} (ref >=60)
Globulin: 2.2 g/dL (calc) (ref 1.9–3.7)
Glucose, Bld: 88 mg/dL (ref 65–99)
Potassium: 4.3 mmol/L (ref 3.5–5.3)
Sodium: 144 mmol/L (ref 135–146)
Total Bilirubin: 0.5 mg/dL (ref 0.2–1.2)
Total Protein: 6.6 g/dL (ref 6.1–8.1)

## 2018-08-30 LAB — HEMOGLOBIN A1C
Hgb A1c MFr Bld: 5.6 % of total Hgb (ref <5.7)
Mean Plasma Glucose: 114 (calc)
eAG (mmol/L): 6.3 (calc)

## 2018-08-30 LAB — LIPID PANEL
Cholesterol: 162 mg/dL (ref <200)
HDL: 59 mg/dL (ref >=50)
LDL Cholesterol (Calc): 89 mg/dL (calc)
Non-HDL Cholesterol (Calc): 103 mg/dL (calc) (ref <130)
Total CHOL/HDL Ratio: 2.7 (calc) (ref <5.0)
Triglycerides: 59 mg/dL (ref <150)

## 2018-08-30 LAB — VITAMIN B12: Vitamin B-12: 1234 pg/mL — ABNORMAL HIGH (ref 200–1100)

## 2018-08-30 LAB — MAGNESIUM: Magnesium: 2.1 mg/dL (ref 1.5–2.5)

## 2018-08-30 LAB — VITAMIN D 25 HYDROXY (VIT D DEFICIENCY, FRACTURES): Vit D, 25-Hydroxy: 68 ng/mL (ref 30–100)

## 2018-09-06 ENCOUNTER — Other Ambulatory Visit: Payer: Self-pay | Admitting: Physician Assistant

## 2018-09-18 ENCOUNTER — Encounter: Payer: Self-pay | Admitting: Obstetrics & Gynecology

## 2018-09-18 LAB — HM MAMMOGRAPHY

## 2018-09-18 LAB — HM DEXA SCAN

## 2018-09-21 ENCOUNTER — Encounter: Payer: Self-pay | Admitting: Internal Medicine

## 2018-09-21 ENCOUNTER — Telehealth: Payer: Self-pay | Admitting: *Deleted

## 2018-09-21 NOTE — Telephone Encounter (Signed)
Patient notified of BMD results. Report to scan.  

## 2018-10-10 ENCOUNTER — Other Ambulatory Visit: Payer: Self-pay

## 2018-10-10 ENCOUNTER — Other Ambulatory Visit: Payer: 59

## 2018-10-10 ENCOUNTER — Telehealth: Payer: 59 | Admitting: Physician Assistant

## 2018-10-10 DIAGNOSIS — R350 Frequency of micturition: Secondary | ICD-10-CM

## 2018-10-10 MED ORDER — AMOXICILLIN-POT CLAVULANATE 875-125 MG PO TABS: 1.0000 | ORAL_TABLET | Freq: Two times a day (BID) | ORAL | 0 refills | Status: AC

## 2018-10-10 NOTE — Telephone Encounter (Signed)
-----   Message from [name], [place] sent at 10/10/2018 12:19 PM EDT ----- Regarding: OFFICE NOTE Contact: [phone] Patient reports sxs:  ---ABDOMINAL & KIDNEY discomfort  ----Spasms  ----Odor  ----Frequency

## 2018-10-10 NOTE — Telephone Encounter (Signed)
HPI: complains of UTI symptoms or kidney stone symptoms. She has history of kidney stones.   She has had an odor to her urine off and on x 1 week.  Onset 4 days ago, progressively worse symptoms associated with small volume voiding with increased frequency, and some spasm.  denies hematuria, flank pain or fever  PMH: reviewed  ROS:  Gen.: No unexpected weight change, no night sweats Lungs: No cough or shortness of breath Cardiovascular: No palpitations or chest pain  PE: General Appearance:Well sounding, in no apparent distress.  ENT/Mouth: No hoarseness, No cough for duration of visit.  Respiratory: completing full sentences without distress, without audible wheeze Neuro: Awake and oriented X 3,  Psych:  Insight and Judgment appropriate.   Lab Results  Component Value Date   WBC 5.0 08/29/2018   HGB 12.3 08/29/2018   HCT 36.9 08/29/2018   PLT 207 08/29/2018   GLUCOSE 88 08/29/2018   CHOL 162 08/29/2018   TRIG 59 08/29/2018   HDL 59 08/29/2018   LDLCALC 89 08/29/2018   ALT 13 08/29/2018   AST 16 08/29/2018   NA 144 08/29/2018   K 4.3 08/29/2018   CL 107 08/29/2018   CREATININE 0.67 08/29/2018   BUN 15 08/29/2018   CO2 29 08/29/2018   TSH 0.93 08/29/2018   INR 1.05 10/14/2009   HGBA1C 5.6 08/29/2018   MICROALBUR 0.6 02/08/2018    Assessment/Plan: UTI versus kidney stone  Empiric antibiotic x7 days Urine culture, will drop off, for identification and sensitivities Hydration recommended education provided If any fever, chills, blood in urine, unable to urinate, severe AB pain or worsening symptoms, patient will go to the ER.   THIS ENCOUNTER IS A VIRTUAL/TELEPHONE VISIT DUE TO COVID-19 - PATIENT WAS NOT SEEN IN THE OFFICE.  PATIENT HAS CONSENTED TO VIRTUAL VISIT / TELEMEDICINE VISIT  This provider placed a call to [name] using telephone, her appointment was changed to a virtual office visit to reduce the risk of exposure to the COVID-19 virus and to help  [name] remain healthy and safe. The virtual visit will also provide continuity of care. She verbalizes understanding.

## 2018-10-12 LAB — URINALYSIS, ROUTINE W REFLEX MICROSCOPIC
Bacteria, UA: NONE SEEN /HPF
Bilirubin Urine: NEGATIVE
Glucose, UA: NEGATIVE
Hgb urine dipstick: NEGATIVE
Hyaline Cast: NONE SEEN /LPF
Ketones, ur: NEGATIVE
Nitrite: NEGATIVE
Protein, ur: NEGATIVE
Specific Gravity, Urine: 1.026 (ref 1.001–1.03)
pH: <=5 (ref 5.0–8.0)

## 2018-10-12 LAB — URINE CULTURE
MICRO NUMBER:: 884255
SPECIMEN QUALITY:: ADEQUATE

## 2018-10-23 ENCOUNTER — Telehealth: Payer: Self-pay | Admitting: Physician Assistant

## 2018-10-23 MED ORDER — ALPRAZOLAM 1 MG PO TABS: ORAL_TABLET | ORAL | 0 refills | Status: DC

## 2018-10-23 NOTE — Telephone Encounter (Signed)
-----   Message from [name], [place] sent at 10/23/2018  9:20 AM EDT ----- Regarding: pharmacy med REFILL Contact: [phone] Per pt/Yellow note:   Refill on XANAX Please & Thank You!   FYI: pharmacy:  [facility] on [address]

## 2018-11-24 ENCOUNTER — Other Ambulatory Visit: Payer: Self-pay | Admitting: Adult Health

## 2018-12-20 ENCOUNTER — Other Ambulatory Visit: Payer: Self-pay

## 2018-12-20 ENCOUNTER — Other Ambulatory Visit: Payer: Self-pay | Admitting: Internal Medicine

## 2018-12-25 ENCOUNTER — Encounter: Payer: Self-pay | Admitting: Obstetrics & Gynecology

## 2018-12-25 ENCOUNTER — Other Ambulatory Visit: Payer: Self-pay

## 2018-12-25 ENCOUNTER — Ambulatory Visit (INDEPENDENT_AMBULATORY_CARE_PROVIDER_SITE_OTHER): Payer: 59 | Admitting: Obstetrics & Gynecology

## 2018-12-25 ENCOUNTER — Other Ambulatory Visit (HOSPITAL_COMMUNITY)
Admission: RE | Admit: 2018-12-25 | Discharge: 2018-12-25 | Disposition: A | Discharge status: Home / Self Care | Payer: 59 | Source: Ambulatory Visit | Attending: Obstetrics & Gynecology | Admitting: Obstetrics & Gynecology

## 2018-12-25 VITALS — BP 122/70 | HR 72 | Temp 98.0°F | Resp 12 | Ht 63.0 in | Wt 108.0 lb

## 2018-12-25 DIAGNOSIS — Z780 Asymptomatic menopausal state: Secondary | ICD-10-CM | POA: Diagnosis not present

## 2018-12-25 DIAGNOSIS — B977 Papillomavirus as the cause of diseases classified elsewhere: Secondary | ICD-10-CM | POA: Diagnosis present

## 2018-12-25 DIAGNOSIS — R87810 Cervical high risk human papillomavirus (HPV) DNA test positive: Secondary | ICD-10-CM | POA: Diagnosis not present

## 2018-12-25 DIAGNOSIS — Z01419 Encounter for gynecological examination (general) (routine) without abnormal findings: Secondary | ICD-10-CM | POA: Insufficient documentation

## 2018-12-25 DIAGNOSIS — Z8619 Personal history of other infectious and parasitic diseases: Secondary | ICD-10-CM

## 2018-12-25 DIAGNOSIS — N3090 Cystitis, unspecified without hematuria: Secondary | ICD-10-CM | POA: Diagnosis not present

## 2018-12-25 DIAGNOSIS — Z87440 Personal history of urinary (tract) infections: Secondary | ICD-10-CM

## 2018-12-25 DIAGNOSIS — Z8616 Personal history of COVID-19: Secondary | ICD-10-CM

## 2018-12-25 LAB — POCT URINALYSIS DIPSTICK
Bilirubin, UA: NEGATIVE
Glucose, UA: NEGATIVE
Ketones, UA: NEGATIVE
Leukocytes, UA: NEGATIVE
Nitrite, UA: NEGATIVE
Protein, UA: NEGATIVE
Urobilinogen, UA: NEGATIVE E.U./dL — AB
pH, UA: 7 (ref 5.0–8.0)

## 2018-12-25 MED ORDER — OXYBUTYNIN CHLORIDE ER 10 MG PO TB24: 10.0000 mg | ORAL_TABLET | Freq: Every day | ORAL | 4 refills | Status: DC

## 2018-12-25 MED ORDER — VALACYCLOVIR HCL 1 G PO TABS: 1000.0000 mg | ORAL_TABLET | Freq: Every day | ORAL | 13 refills | Status: DC

## 2018-12-25 NOTE — Progress Notes (Signed)
63 y.o. G2P2 Married White or Caucasian female here for annual exam. Patient would like to have her urine checked due to having a UTI 12/09/18. Patient denies having any new symptoms and has completed the course of antibiotics.  She has been experiencing RLQ pain but she has this at time with urinary issues and/or stones.  Denies vaginal bleeding.    She's had Covid and would like antibody testing today.  Patient's last menstrual period was 01/20/2011.          Sexually active: Yes.    The current method of family planning is post menopausal status.    Exercising: Yes.    gym occasionally Smoker:  Former  Health Maintenance: Pap:   12/16/17 Neg:Pos HR HPV but 16/18/45 Neg  05/25/16 Normal + HPV but the 16/18/45 testing was negative  04/24/15 Neg: (+) HR HPV  02/05/13 Neg History of abnormal Pap:  yes MMG:  09/18/18 BIRADS 1 negative/density b Colonoscopy:  07/12/12 hyperplastic polyp repeat 10 years   BMD:   09/18/18 Osteopenia TDaP:  2016 Pneumonia vaccine(s):  none Shingrix:   Zoster 2009 Hep C testing: 04/24/15 Neg Screening Labs: PCP   reports that she quit smoking about 7 years ago. Her smoking use included cigarettes. She has never used smokeless tobacco. She reports current alcohol use of about 5.0 standard drinks of alcohol per week. She reports that she does not use drugs.  Past Medical History:  Diagnosis Date  . Allergy   . Anal fissure   . ANAL FISSURE, HX OF 09/24/2008   Qualifier: Diagnosis of  By: Tamala Julian CMA, Claiborne Billings    . Anxiety   . Fibroids    Uterine  . GERD (gastroesophageal reflux disease)   . HSV-2 (herpes simplex virus 2) infection   . Hyperlipidemia   . IBS (irritable bowel syndrome)   . Interstitial cystitis   . Kidney stone 2019   --Dr.Evans  . Migraines   . Nevus    vulva nevus  . Prediabetes     Past Surgical History:  Procedure Laterality Date  . BLADDER REPAIR    . BUNIONECTOMY    . CHOLECYSTECTOMY    . LAPAROSCOPY  1985/ 1990   due to  infertility  . LITHOTRIPSY    . TONSILLECTOMY    . tubal reconstruction    . tubalplasty  1985/ 1990   . VAGINAL DELIVERY     x2    Current Outpatient Medications  Medication Sig Dispense Refill  . ALPRAZolam (XANAX) 1 MG tablet TAKE 1/2 TABLET BY MOUTH 2-3 TIMES A DAY AS NEEDED FOR ANXIETY ATTACK, LIMIT TO 5 TIMES A WEEK 30 tablet 0  . Calcium-Magnesium-Vitamin D (CALCIUM 500 PO) Take 1 tablet by mouth daily.    . Cholecalciferol (VITAMIN D-3 PO) Take 1 capsule by mouth daily.    Marland Kitchen conjugated estrogens (PREMARIN) vaginal cream 1/2 gram vaginally twice weekly 30 g 5  . Cyanocobalamin (B-12 PO) Take by mouth.    . diclofenac (VOLTAREN) 50 MG EC tablet Take 50 mg by mouth 2 (two) times daily.    Marland Kitchen docusate sodium (COLACE) 100 MG capsule Take 100 mg by mouth 2 (two) times daily.    Marland Kitchen estradiol (ESTRACE) 0.5 MG tablet Take 1 tablet (0.5 mg total) by mouth daily. 30 tablet 6  . medroxyPROGESTERone (PROVERA) 2.5 MG tablet TAKE 1 TABLET BY MOUTH EVERY DAY 30 tablet 5  . omeprazole (PRILOSEC) 40 MG capsule Take 1 capsule (40 mg total) by mouth daily.  Take at supper meal 90 capsule 2  . oxybutynin (DITROPAN-XL) 10 MG 24 hr tablet Take 1 tablet (10 mg total) by mouth at bedtime. 90 tablet 4  . rosuvastatin (CRESTOR) 20 MG tablet Take 1 tablet Daily for Cholesterol 90 tablet 1  . valACYclovir (VALTREX) 1000 MG tablet Take 1 tablet (1,000 mg total) by mouth daily. 30 tablet 13  . polyethylene glycol powder (GLYCOLAX/MIRALAX) powder MIX 17 GRAMS AS DIRECTED TO TAKE BY MOUTH TWICE A DAY AS DIRECTED (Patient not taking: Reported on 12/25/2018) 255 g 3   No current facility-administered medications for this visit.     Family History  Problem Relation Age of Onset  . Celiac disease Mother   . Heart disease Father   . Irritable bowel syndrome Sister   . Thyroid disease Sister   . Pancreatic cancer Maternal Grandmother   . Lung cancer Maternal Grandmother   . Diabetes Paternal Grandmother   .  Breast cancer Paternal Grandmother   . Kidney disease Other        tumor  . Cancer Sister 62       Melanoma  . Heart disease Sister   . Colon cancer Neg Hx     Review of Systems  All other systems reviewed and are negative.   Exam:   BP 122/70 (BP Location: Right Arm, Patient Position: Sitting, Cuff Size: Normal)   Pulse 72   Temp 98 F (36.7 C) (Temporal)   Resp 12   Ht 5\' 3"  (1.6 m)   Wt 108 lb (49 kg)   LMP 01/20/2011   BMI 19.13 kg/m    Height: 5\' 3"  (160 cm)  Ht Readings from Last 3 Encounters:  12/25/18 5\' 3"  (1.6 m)  05/17/18 5\' 4"  (1.626 m)  03/20/18 5\' 4"  (1.626 m)    General appearance: alert, cooperative and appears stated age Head: Normocephalic, without obvious abnormality, atraumatic Neck: no adenopathy, supple, symmetrical, trachea midline and thyroid normal to inspection and palpation Lungs: clear to auscultation bilaterally Breasts: normal appearance, no masses or tenderness Heart: regular rate and rhythm Abdomen: soft, non-tender; bowel sounds normal; no masses,  no organomegaly Extremities: extremities normal, atraumatic, no cyanosis or edema Skin: Skin color, texture, turgor normal. No rashes or lesions Lymph nodes: Cervical, supraclavicular, and axillary nodes normal. No abnormal inguinal nodes palpated Neurologic: Grossly normal  Pelvic: External genitalia:  no lesions              Urethra:  normal appearing urethra with no masses, tenderness or lesions              Bartholins and Skenes: normal                 Vagina: normal appearing vagina with normal color and discharge, no lesions              Cervix: no lesions              Pap taken: Yes.   Bimanual Exam:  Uterus:  normal size, contour, position, consistency, mobility, non-tender              Adnexa: normal adnexa and no mass, fullness, tenderness               Rectovaginal: Confirms               Anus:  normal sphincter tone, no lesions  Chaperone was present for exam.  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  She will let me know when needs RFs Will call if/when needs RF for premarin RF for oxybutynin 10mg  daily.  #90/4RF. RF for valtrex 1gram daily.  #30/12 Urine culture pending today Covid antibody testing obtained today Return annually or prn

## 2018-12-26 LAB — SAR COV2 SEROLOGY (COVID19)AB(IGG),IA

## 2018-12-26 LAB — EUROIMMUN SARS-COV-2 AB, IGG: Euroimmun SARS-CoV-2 Ab, IgG: POSITIVE — AB

## 2018-12-27 ENCOUNTER — Telehealth: Payer: Self-pay

## 2018-12-27 LAB — URINE CULTURE

## 2018-12-27 NOTE — Telephone Encounter (Signed)
Received lab results from Covid Antibody test. Test positive. Reviewed OV from 12/25/18. Pt had known Covid.   Will route to Dr Sabra Heck for recommendations.

## 2018-12-28 ENCOUNTER — Other Ambulatory Visit: Payer: Self-pay

## 2018-12-28 MED ORDER — AMOXICILLIN-POT CLAVULANATE 500-125 MG PO TABS: 1.0000 | ORAL_TABLET | Freq: Two times a day (BID) | ORAL | 0 refills | Status: AC

## 2018-12-28 NOTE — Telephone Encounter (Signed)
This has been routed to [place] to call pt.  Pt had known covid but wanted to know if had antibodies.  Ok to close encounter.

## 2018-12-31 ENCOUNTER — Other Ambulatory Visit: Payer: Self-pay | Admitting: Obstetrics & Gynecology

## 2018-12-31 LAB — CYTOLOGY - PAP
Comment: NEGATIVE
Comment: NEGATIVE
Diagnosis: NEGATIVE
HPV 16: NEGATIVE
HPV 18 / 45: NEGATIVE
High risk HPV: POSITIVE — AB

## 2019-01-01 ENCOUNTER — Other Ambulatory Visit: Payer: Self-pay | Admitting: Physician Assistant

## 2019-01-01 NOTE — Telephone Encounter (Signed)
Medication refill request: Estradiol Last AEX:  12/25/18 SM Next AEX: 03/07/20 Last MMG (if hormonal medication request): 09/18/18  BIRADS 1 negative/density b Refill authorized: Please advise

## 2019-01-05 ENCOUNTER — Other Ambulatory Visit: Payer: Self-pay | Admitting: *Deleted

## 2019-01-05 ENCOUNTER — Telehealth: Payer: Self-pay | Admitting: Obstetrics & Gynecology

## 2019-01-05 DIAGNOSIS — B977 Papillomavirus as the cause of diseases classified elsewhere: Secondary | ICD-10-CM

## 2019-01-05 NOTE — Telephone Encounter (Signed)
Call placed to convey benefits for colposcopy. Spoke with patient and conveyed benefits. Patient understands/agreeable with the benefits. Patient is aware of the cancellation policy. Appointment scheduled 01/09/19.

## 2019-01-08 ENCOUNTER — Telehealth: Payer: Self-pay | Admitting: Obstetrics & Gynecology

## 2019-01-08 NOTE — Telephone Encounter (Signed)
Patient called, states she spoke with [facility] on Friday, 01/05/2019 and scheduled a colposcopy for 01/09/2019. Patient is asking is ok to reschedule after January 26, 2019? Patient states insurance renews January 26, 2019 and would like to apply 2021 benefits, if ok to defer appointment until that time.  Forwarding to Triage Nurse to review

## 2019-01-08 NOTE — Telephone Encounter (Signed)
Call reviewed with Dr. Sabra Heck, call returned to patient.   Nurse visit scheduled for 12/17 at 3:45pm  Colpo scheduled for 02/02/19 at 2pm with Dr. Sabra Heck.   Routing to provider for final review. Patient is agreeable to disposition. Will close encounter.  Cc: Lerry Liner, Magdalene Patricia

## 2019-01-08 NOTE — Telephone Encounter (Signed)
Routing to Dr. Sabra Heck to review and advise.   12/25/18 pap neg, positive HPV  Patient is also to return for repeat UC.

## 2019-01-08 NOTE — Telephone Encounter (Signed)
Ok to wait for colposcopy until January.  Please check with pt about having urine culture tested.  Ok to be on nurse lab appt for this.  Thanks.

## 2019-01-09 ENCOUNTER — Ambulatory Visit: Payer: Self-pay | Admitting: Obstetrics & Gynecology

## 2019-01-11 ENCOUNTER — Other Ambulatory Visit: Payer: Self-pay

## 2019-01-11 ENCOUNTER — Ambulatory Visit (INDEPENDENT_AMBULATORY_CARE_PROVIDER_SITE_OTHER): Payer: 59 | Admitting: *Deleted

## 2019-01-11 ENCOUNTER — Ambulatory Visit: Payer: Self-pay

## 2019-01-11 VITALS — BP 108/60 | HR 66 | Temp 98.4°F | Resp 12 | Ht 63.0 in | Wt 108.5 lb

## 2019-01-11 DIAGNOSIS — Z87440 Personal history of urinary (tract) infections: Secondary | ICD-10-CM

## 2019-01-11 NOTE — Progress Notes (Signed)
Patient here for follow up urine culture. States she completed augmentin as prescribed. States she is still having "same issue" she always is. Can't describe it. States it is not a "burning sensation," but thinks it is related to her IC. Clean catch urine provided and sent for culture. Patient advised once results back and reviewed by Dr. Sabra Heck patient would be contacted.   Routing to provider and will close encounter.

## 2019-01-13 LAB — URINE CULTURE

## 2019-01-31 ENCOUNTER — Other Ambulatory Visit: Payer: Self-pay

## 2019-02-02 ENCOUNTER — Other Ambulatory Visit (HOSPITAL_COMMUNITY)
Admission: RE | Admit: 2019-02-02 | Discharge: 2019-02-02 | Disposition: A | Discharge status: Home / Self Care | Payer: 59 | Source: Ambulatory Visit | Attending: Obstetrics & Gynecology | Admitting: Obstetrics & Gynecology

## 2019-02-02 ENCOUNTER — Ambulatory Visit (INDEPENDENT_AMBULATORY_CARE_PROVIDER_SITE_OTHER): Payer: 59 | Admitting: Obstetrics & Gynecology

## 2019-02-02 ENCOUNTER — Encounter: Payer: Self-pay | Admitting: Obstetrics & Gynecology

## 2019-02-02 ENCOUNTER — Other Ambulatory Visit: Payer: Self-pay

## 2019-02-02 DIAGNOSIS — B977 Papillomavirus as the cause of diseases classified elsewhere: Secondary | ICD-10-CM | POA: Diagnosis not present

## 2019-02-02 DIAGNOSIS — R87810 Cervical high risk human papillomavirus (HPV) DNA test positive: Secondary | ICD-10-CM | POA: Diagnosis not present

## 2019-02-02 DIAGNOSIS — N870 Mild cervical dysplasia: Secondary | ICD-10-CM | POA: Insufficient documentation

## 2019-02-02 NOTE — Patient Instructions (Signed)
Colposcopy Post-procedure Instructions . Cramping is common.  You may take 400mg  Ibuprofen and 500mg  Tylenol every 4 hours as needed for cramping.  This should resolve within the next two to three days.   . You may have bright red spotting or blackish discharge for several days after your procedure.  The discharge occurs because of a topical solution used to stop bleeding at the biopsy site(s).  You should wear a mini pad for the next few days. . Refrain from putting anything in the vagina until the bleeding and/or discharge stops (usually less than a week). . You need to call the office if you have any pelvic pain, fever, heavy bleeding, or foul smelling vaginal discharge. . Shower or bathe as normal . You will be notified within one week of your biopsy results or we will discuss your results at your follow-up appointment if needed.

## 2019-02-02 NOTE — Progress Notes (Signed)
64 y.o. 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  Prior evaluation/treatment:  No recent evaluation.  Patient's last menstrual period was 01/20/2011.          Sexually active: Yes.    The current method of family planning is post menopausal status.     Patient has been counseled about results and procedure.  Risks and benefits have bene reviewed including immediate and/or delayed bleeding, infection, cervical scaring from procedure, possibility of needing additional follow up as well as treatment.  rare risks of missing a lesion discussed as well.  All questions answered.  Pt ready to proceed.  BP 126/70 (BP Location: Right Arm, Patient Position: Sitting, Cuff Size: Normal)   Pulse 76   Temp (!) 97 F (36.1 C) (Temporal)   Ht 5\' 3"  (1.6 m)   Wt 108 lb 3.2 oz (49.1 kg)   LMP 01/20/2011   BMI 19.17 kg/m   Physical Exam  Constitutional: She is oriented to person, place, and time. She appears well-developed and well-nourished.  Genitourinary:    Vagina normal.  There is no rash, tenderness, lesion or injury on the right labia. There is no rash, tenderness, lesion or injury on the left labia. Cervix exhibits no motion tenderness, no discharge and no friability.     Lymphadenopathy:       Right: No inguinal adenopathy present.       Left: No inguinal adenopathy present.  Neurological: She is alert and oriented to person, place, and time.  Skin: Skin is warm and dry.  Psychiatric: She has a normal mood and affect.    Speculum placed.  3% acetic acid applied to cervix for >45 seconds.  Cervix visualized with both 7.5X and 15X magnification.  Green filter also used.  Lugols solution was used.  Findings:  No AWE noted, decreased staining with Lugol's at 12 o'clock.  Biopsy:  12 o'clock biopsy obtained.  ECC:  was performed.  Monsel's was not needed.  Excellent hemostasis was present.  Pt  tolerated procedure well and all instruments were removed.  Findings noted above on picture of cervix.  Chaperone, Terence Lux, CMA, was present during procedure.  Assessment:  HR HPV with normal pap smear  Plan:  Pathology results will be called to patient and follow-up planned pending results.

## 2019-02-06 LAB — SURGICAL PATHOLOGY

## 2019-02-12 ENCOUNTER — Ambulatory Visit: Payer: 59 | Admitting: Physician Assistant

## 2019-02-12 ENCOUNTER — Encounter: Payer: Self-pay | Admitting: Physician Assistant

## 2019-02-12 ENCOUNTER — Other Ambulatory Visit: Payer: Self-pay

## 2019-02-12 VITALS — BP 118/74 | HR 65 | Temp 97.5°F | Ht 63.5 in | Wt 109.0 lb

## 2019-02-12 DIAGNOSIS — G43009 Migraine without aura, not intractable, without status migrainosus: Secondary | ICD-10-CM

## 2019-02-12 DIAGNOSIS — Z1329 Encounter for screening for other suspected endocrine disorder: Secondary | ICD-10-CM

## 2019-02-12 DIAGNOSIS — N3010 Interstitial cystitis (chronic) without hematuria: Secondary | ICD-10-CM

## 2019-02-12 DIAGNOSIS — E559 Vitamin D deficiency, unspecified: Secondary | ICD-10-CM | POA: Insufficient documentation

## 2019-02-12 DIAGNOSIS — Z1389 Encounter for screening for other disorder: Secondary | ICD-10-CM

## 2019-02-12 DIAGNOSIS — F411 Generalized anxiety disorder: Secondary | ICD-10-CM

## 2019-02-12 DIAGNOSIS — Z0001 Encounter for general adult medical examination with abnormal findings: Secondary | ICD-10-CM

## 2019-02-12 DIAGNOSIS — Z79899 Other long term (current) drug therapy: Secondary | ICD-10-CM | POA: Insufficient documentation

## 2019-02-12 DIAGNOSIS — E782 Mixed hyperlipidemia: Secondary | ICD-10-CM

## 2019-02-12 DIAGNOSIS — K581 Irritable bowel syndrome with constipation: Secondary | ICD-10-CM

## 2019-02-12 DIAGNOSIS — B009 Herpesviral infection, unspecified: Secondary | ICD-10-CM

## 2019-02-12 DIAGNOSIS — I10 Essential (primary) hypertension: Secondary | ICD-10-CM

## 2019-02-12 DIAGNOSIS — T7840XS Allergy, unspecified, sequela: Secondary | ICD-10-CM

## 2019-02-12 DIAGNOSIS — R1013 Epigastric pain: Secondary | ICD-10-CM

## 2019-02-12 DIAGNOSIS — Z1152 Encounter for screening for COVID-19: Secondary | ICD-10-CM

## 2019-02-12 DIAGNOSIS — E538 Deficiency of other specified B group vitamins: Secondary | ICD-10-CM | POA: Insufficient documentation

## 2019-02-12 DIAGNOSIS — E348 Other specified endocrine disorders: Secondary | ICD-10-CM

## 2019-02-12 DIAGNOSIS — R7309 Other abnormal glucose: Secondary | ICD-10-CM

## 2019-02-12 DIAGNOSIS — D219 Benign neoplasm of connective and other soft tissue, unspecified: Secondary | ICD-10-CM

## 2019-02-12 MED ORDER — OXYBUTYNIN CHLORIDE 5 MG PO TABS: 5.0000 mg | ORAL_TABLET | Freq: Three times a day (TID) | ORAL | 3 refills | Status: DC

## 2019-02-12 MED ORDER — TRAZODONE HCL 50 MG PO TABS: ORAL_TABLET | ORAL | 2 refills | Status: DC

## 2019-02-12 NOTE — Patient Instructions (Addendum)
NEW GUIDELINES FOR BENOZOS  New guidelines suggest the benzodiazepines are best short term, with prolonged use they lead to physical and psychological dependence. In addition, evidence suggest that for insomnia the effectiveness wanes in 4 weeks and the risks out weight their benefits. Use of these agents have been associated with dementia, falls, motor vehicle accidents and physical addiction. Decreasing these medication have been proven to show improvements in cognition, alertness, decrease of falls and daytime sedation.   We will start a slow taper, symptoms of withdrawal include, insomnia, anxiety, irritability, sweating and stomach or intestinal symptoms like diarrhea or nausea.   TRAZODONE  Try the trazodone This is not habit forming, you will not build a tolerance to it Please start out at 25mg  or 1/2 pill, can increase to 50mg  at night, and the max of this medication is 150mg  at night.   Side effects can be odd dreams,  and drowsiness.   Take omeprazole daily for 4 weeks, then you can stop or continue as needed.  Avoid alcohol, spicy foods, NSAIDS (aleve, ibuprofen) at this time. See foods below. If not better will refer to GI/get imaging.    Food Choices for Gastroesophageal Reflux Disease When you have gastroesophageal reflux disease (GERD), the foods you eat and your eating habits are very important. Choosing the right foods can help ease the discomfort of GERD. WHAT GENERAL GUIDELINES DO I NEED TO FOLLOW?  Choose fruits, vegetables, whole grains, low-fat dairy products, and low-fat meat, fish, and poultry.  Limit fats such as oils, salad dressings, butter, nuts, and avocado.  Keep a food diary to identify foods that cause symptoms.  Avoid foods that cause reflux. These may be different for different people.  Eat frequent small meals instead of three large meals each day.  Eat your meals slowly, in a relaxed setting.  Limit fried foods.  Cook foods using methods other  than frying.  Avoid drinking alcohol.  Avoid drinking large amounts of liquids with your meals.  Avoid bending over or lying down until 2-3 hours after eating. WHAT FOODS ARE NOT RECOMMENDED? The following are some foods and drinks that may worsen your symptoms: Vegetables Tomatoes. Tomato juice. Tomato and spaghetti sauce. Chili peppers. Onion and garlic. Horseradish. Fruits Oranges, grapefruit, and lemon (fruit and juice). Meats High-fat meats, fish, and poultry. This includes hot dogs, ribs, ham, sausage, salami, and bacon. Dairy Whole milk and chocolate milk. Sour cream. Cream. Butter. Ice cream. Cream cheese.  Beverages Coffee and tea, with or without caffeine. Carbonated beverages or energy drinks. Condiments Hot sauce. Barbecue sauce.  Sweets/Desserts Chocolate and cocoa. Donuts. Peppermint and spearmint. Fats and Oils High-fat foods, including [place] fries and potato chips. Other Vinegar. Strong spices, such as black pepper, white pepper, red pepper, cayenne, curry powder, cloves, ginger, and chili powder.  What is the TMJ? The temporomandibular (tem-PUH-ro-man-DIB-yoo-ler) joint, or the TMJ, connects the upper and lower jawbones. This joint allows the jaw to open wide and move back and forth when you chew, talk, or yawn.There are also several muscles that help this joint move. There can be muscle tightness and pain in the muscle that can cause several symptoms.  What causes TMJ pain? There are many causes of TMJ pain. Repeated chewing (for example, chewing gum) and clenching your teeth can cause pain in the joint. Some TMJ pain has no obvious cause. What can I do to ease the pain? There are many things you can do to help your pain get better. When you  have pain:  Eat soft foods and stay away from chewy foods (for example, taffy) Try to use both sides of your mouth to chew Don't chew gum Massage Don't open your mouth wide (for example, during yawning or singing) Don't  bite your cheeks or fingernails Lower your amount of stress and worry Applying a warm, damp washcloth to the joint may help. Over-the-counter pain medicines such as ibuprofen (one brand: Advil) or acetaminophen (one brand: Tylenol) might also help. Do not use these medicines if you are allergic to them or if your doctor told you not to use them. How can I stop the pain from coming back? When your pain is better, you can do these exercises to make your muscles stronger and to keep the pain from coming back:  Resisted mouth opening: Place your thumb or two fingers under your chin and open your mouth slowly, pushing up lightly on your chin with your thumb. Hold for three to six seconds. Close your mouth slowly. Resisted mouth closing: Place your thumbs under your chin and your two index fingers on the ridge between your mouth and the bottom of your chin. Push down lightly on your chin as you close your mouth. Tongue up: Slowly open and close your mouth while keeping the tongue touching the roof of the mouth. Side-to-side jaw movement: Place an object about one fourth of an inch thick (for example, two tongue depressors) between your front teeth. Slowly move your jaw from side to side. Increase the thickness of the object as the exercise becomes easier Forward jaw movement: Place an object about one fourth of an inch thick between your front teeth and move the bottom jaw forward so that the bottom teeth are in front of the top teeth. Increase the thickness of the object as the exercise becomes easier. These exercises should not be painful. If it hurts to do these exercises, stop doing them and talk to your family doctor.

## 2019-02-12 NOTE — Progress Notes (Signed)
Complete Physical  Assessment and Plan:  Epigastric pain  DDX GERD, ulcer , pancreatitis- nonexertional at night, normal EKG  check labs, bland diet, small portions, increase H20, PPI DAILY Stop NSAIDS, ETOH if not better will refer to GI Patient advised to go to the ER if the symptoms increase or worsen.    Mixed hyperlipidemia -continue medications, check lipids, decrease fatty foods, increase activity.  -     Lipid panel -     EKG 12-Lead  Migraine without aura and without status migrainosus, not intractable Avoid triggers  Irritable bowel syndrome with constipation Increase fiber, diet discussed  Constipation, unspecified constipation type Increase fiber, diet discussed  Anxiety state -continue medications, stress management techniques discussed, increase water, good sleep hygiene discussed, increase exercise, and increase veggies.   Chronic interstitial cystitis Continue meds- try 5 mg BID-TID due to cose, decrease stress, artifical sugars.   Abnormal glucose Discussed disease progression and risks Discussed diet/exercise, weight management and risk modification -     Defer A1C today, weight stable, normal A1C last visit  HSV-2 infection Continue medications  Allergic state, sequela Continue OTC meds  Medication management -     CBC with Differential/Platelet -     BASIC METABOLIC PANEL WITH GFR -     Hepatic function panel -     Magnesium  Screening for thyroid disorder -     TSH  Encounter for general adult medical examination with abnormal findings 1 year  Adult BMI <19 kg/sq m Add on protein powder, start to work out again.   Hematuria, unspecified type S/p lithotripsy Recheck today  Vaginal atrophy Follow up GYN  Vitamin D deficiency -     VITAMIN D 25 Hydroxy (Vit-D Deficiency, Fractures)  B12 def  Recheck this visit  Discussed med's effects and SE's. Screening labs and tests as requested with regular follow-up as recommended. Future  Appointments  Date Time Provider [place]  01/01/2020  3:30 PM Megan Salon, MD [place] None  02/12/2020  3:00 PM Vicie Mutters, PA-C [id number] None    HPI  64 y.o. female  presents for a complete physical.  Has area on the crown of her head with "lump" that burns/hurts with pushing on it, some tingling with it, x 2 months, goes to hair dresser every 8 weeks.    Had COVID 6 months ago, wants to know if she still has antibodies.   She will have severe epigastric pressure that goes over her RUQ, will happen mainly at night, non exertional, every couple of months. Has had her gallbladder removed in the past, states it feels like that. She is on omeprazole every other day. EGD 2014. She has been on diclofenac 50 mg BID. She is not on tumeric over the counter. She does wine/mixed drink 3 days a week. She is following with Dr. Loletha Carrow.   She is on oxybutinin XL but needs the 5 mg due to cost. Can not afford the myrebrtiq even with the card. Crestor is expensive at [facility], will send into [facility] and use good RX  Her blood pressure has been controlled at home, today their BP is BP: 118/74.  She does workout. She denies chest pain, shortness of breath, dizziness.   BMI is Body mass index is 19.01 kg/m., she is working on diet and exercise. Wt Readings from Last 3 Encounters:  02/12/19 109 lb (49.4 kg)  02/02/19 108 lb 3.2 oz (49.1 kg)  01/11/19 108 lb 8 oz (49.2 kg)  She is on cholesterol medication. She is doing 20 mg every other day. Her cholesterol is at goal. The cholesterol last visit was:  Lab Results  Component Value Date   CHOL 162 08/29/2018   HDL 59 08/29/2018   LDLCALC 89 08/29/2018   TRIG 59 08/29/2018   CHOLHDL 2.7 08/29/2018   She has been working on diet and exercise for prediabetes, she is on bASA, she is not on ACE/ARB and denies foot ulcerations, hyperglycemia, hypoglycemia , increased appetite, nausea, paresthesia of the feet, polydipsia, polyuria, visual  disturbances, vomiting and weight loss. Last A1C in the office was:  Lab Results  Component Value Date   HGBA1C 5.6 08/29/2018   Patient is on Vitamin D supplement.   Lab Results  Component Value Date   VD25OH 9 08/29/2018      She is very stressed, keeps her grand babies and another baby 3 days a week, 1 under 2 one is 2 and one is 62 years old. She mainly takes xanax 0.25 but had to get the 1 mg due to shortage and she can only take this at night 0.5mg  at night, none during the day. Interested in trying something else for sleep.   She has had hematuria and recurrent UTI, but had kidney stone s/p lithotripsy and is on the premarin cream, has more sensation of kidney stone and has follow up.    Current Medications:   Current Outpatient Medications (Endocrine & Metabolic):  .  estradiol (ESTRACE) 0.5 MG tablet, TAKE 1 TABLET BY MOUTH EVERY DAY .  medroxyPROGESTERone (PROVERA) 2.5 MG tablet, TAKE 1 TABLET BY MOUTH EVERY DAY  Current Outpatient Medications (Cardiovascular):  .  rosuvastatin (CRESTOR) 20 MG tablet, Take 1 tablet Daily for Cholesterol   Current Outpatient Medications (Analgesics):  .  diclofenac (VOLTAREN) 50 MG EC tablet, Take 50 mg by mouth 2 (two) times daily.  Current Outpatient Medications (Hematological):  Marland Kitchen  Cyanocobalamin (B-12 PO), Take by mouth.  Current Outpatient Medications (Other):  Marland Kitchen  ALPRAZolam (XANAX) 1 MG tablet, TAKE 1/2 TABLET BY MOUTH 2-3 TIMES A DAY AS NEEDED FOR ANXIETY ATTACK, LIMIT TO 5 TIMES A WEEK .  Calcium-Magnesium-Vitamin D (CALCIUM 500 PO), Take 1 tablet by mouth daily. .  Cholecalciferol (VITAMIN D-3 PO), Take 1 capsule by mouth daily. Marland Kitchen  conjugated estrogens (PREMARIN) vaginal cream, 1/2 gram vaginally twice weekly .  docusate sodium (COLACE) 100 MG capsule, Take 100 mg by mouth 2 (two) times daily. Marland Kitchen  omeprazole (PRILOSEC) 40 MG capsule, Take 1 capsule (40 mg total) by mouth daily. Take at supper meal .  oxybutynin (DITROPAN-XL) 10  MG 24 hr tablet, Take 1 tablet (10 mg total) by mouth at bedtime. .  polyethylene glycol powder (GLYCOLAX/MIRALAX) powder, MIX 17 GRAMS AS DIRECTED TO TAKE BY MOUTH TWICE A DAY AS DIRECTED .  valACYclovir (VALTREX) 1000 MG tablet, Take 1 tablet (1,000 mg total) by mouth daily.  Health Maintenance:   Immunization History  Administered Date(s) Administered  . DTaP 01/26/2004  . Influenza Inj Mdck Quad Pf 10/29/2016, 10/17/2017, 10/20/2018  . Influenza,inj,Quad PF,6+ Mos 10/20/2018  . Influenza-Unspecified 10/22/2015, 10/29/2016, 10/17/2017  . Tdap 01/28/2014  . Zoster 01/26/2007   Tetanus: 2016 Flu vaccine: 2020  Zostavax: 2009  Pap: 2017 Dr. Sabra Heck MGM: 2020 Dr. Sabra Heck at [place] 08/2016 Colonoscopy: 2014 Last Dental Exam:  Dr. Ennis Forts Last Eye Exam: Dr. Earl Gala  Patient Care Team: Unk Pinto, MD as PCP - General (Internal Medicine) Lafayette Dragon, MD (Inactive)  as Consulting Physician (Gastroenterology)   Medical History:  Past Medical History:  Diagnosis Date  . Allergy   . Anal fissure   . ANAL FISSURE, HX OF 09/24/2008   Qualifier: Diagnosis of  By: Tamala Julian CMA, Claiborne Billings    . Anxiety   . Fibroids    Uterine  . GERD (gastroesophageal reflux disease)   . HSV-2 (herpes simplex virus 2) infection   . Hyperlipidemia   . IBS (irritable bowel syndrome)   . Interstitial cystitis   . Kidney stone 2019   --Dr.Evans  . Migraines   . Nevus    vulva nevus  . Prediabetes    Allergies Allergies  Allergen Reactions  . Sulfa Antibiotics Hives, Shortness Of Breath and Rash  . Macrobid [Nitrofurantoin Macrocrystal] Nausea Only    SURGICAL HISTORY She  has a past surgical history that includes Cholecystectomy; tubal reconstruction; Bladder repair; Tonsillectomy; Vaginal delivery; laparoscopy (1985/ 1990); tubalplasty (1985/ 1990 ); Bunionectomy; and Lithotripsy. FAMILY HISTORY Her family history includes Breast cancer in her paternal grandmother; Cancer (age of onset: 27)  in her sister; Celiac disease in her mother; Diabetes in her paternal grandmother; Heart disease in her father and sister; Irritable bowel syndrome in her sister; Kidney disease in an other family member; Lung cancer in her maternal grandmother; Pancreatic cancer in her maternal grandmother; Thyroid disease in her sister. SOCIAL HISTORY She  reports that she quit smoking about 7 years ago. Her smoking use included cigarettes. She has never used smokeless tobacco. She reports current alcohol use of about 5.0 standard drinks of alcohol per week. She reports that she does not use drugs.  Review of Systems: Review of Systems  Constitutional: Negative for chills, fever and malaise/fatigue.  HENT: Negative for congestion, ear pain and sore throat.   Eyes: Negative.   Respiratory: Negative for cough, shortness of breath and wheezing.   Cardiovascular: Negative for chest pain, palpitations and leg swelling.  Gastrointestinal: Positive for abdominal pain and heartburn. Negative for blood in stool, constipation, diarrhea, melena, nausea and vomiting.  Genitourinary: Positive for flank pain and hematuria. Negative for dysuria, frequency and urgency.  Skin: Negative.   Neurological: Negative for dizziness, sensory change, loss of consciousness and headaches.  Psychiatric/Behavioral: Negative for depression. The patient is not nervous/anxious and does not have insomnia.     Physical Exam: Estimated body mass index is 19.01 kg/m as calculated from the following:   Height as of this encounter: 5' 3.5" (1.613 m).   Weight as of this encounter: 109 lb (49.4 kg). BP 118/74   Pulse 65   Temp (!) 97.5 F (36.4 C)   Ht 5' 3.5" (1.613 m)   Wt 109 lb (49.4 kg)   LMP 01/20/2011   SpO2 97%   BMI 19.01 kg/m   General Appearance: Well nourished well developed, in no apparent distress.  Eyes: PERRLA, EOMs, conjunctiva no swelling or erythema ENT/Mouth: Ear canals normal without obstruction, swelling,  erythema, or discharge.  TMs normal bilaterally with no erythema, bulging, retraction, or loss of landmark.  Oropharynx moist and clear with no exudate, erythema, or swelling.   Neck: Supple, thyroid normal. No bruits.  No cervical adenopathy Respiratory: Respiratory effort normal, Breath sounds clear A&P without wheeze, rhonchi, rales.   Cardio: RRR without murmurs, rubs or gallops. Brisk peripheral pulses without edema.  Chest: symmetric, with normal excursions Breasts: Deferred to GYN Abdomen: Soft, nontender, no guarding, rebound, hernias, masses, or organomegaly.  Lymphatics: Non tender without lymphadenopathy.  Musculoskeletal: Full ROM  all peripheral extremities,5/5 strength, and normal gait.  Skin: Warm, dry without rashes, lesions, ecchymosis. Neuro: Awake and oriented X 3, Cranial nerves intact, reflexes equal bilaterally. Normal muscle tone, no cerebellar symptoms. Sensation intact.  Psych:  normal affect, Insight and Judgment appropriate.   EKG: WNL, sinus brady, no ST changes.   Over 40 minutes of exam, counseling, chart review and critical decision making was performed  Vicie Mutters 4:13 PM [facility]

## 2019-02-13 LAB — LIPID PANEL
Cholesterol: 155 mg/dL (ref <200)
HDL: 57 mg/dL (ref >=50)
LDL Cholesterol (Calc): 80 mg/dL (calc)
Non-HDL Cholesterol (Calc): 98 mg/dL (calc) (ref <130)
Total CHOL/HDL Ratio: 2.7 (calc) (ref <5.0)
Triglycerides: 99 mg/dL (ref <150)

## 2019-02-13 LAB — CBC WITH DIFFERENTIAL/PLATELET
Absolute Monocytes: 446 cells/uL (ref 200–950)
Basophils Absolute: 41 cells/uL (ref 0–200)
Basophils Relative: 0.9 %
Eosinophils Absolute: 60 cells/uL (ref 15–500)
Eosinophils Relative: 1.3 %
HCT: 38.7 % (ref 35.0–45.0)
Hemoglobin: 12.8 g/dL (ref 11.7–15.5)
Lymphs Abs: 1527 cells/uL (ref 850–3900)
MCH: 29.8 pg (ref 27.0–33.0)
MCHC: 33.1 g/dL (ref 32.0–36.0)
MCV: 90 fL (ref 80.0–100.0)
MPV: 10.4 fL (ref 7.5–12.5)
Monocytes Relative: 9.7 %
Neutro Abs: 2525 cells/uL (ref 1500–7800)
Neutrophils Relative %: 54.9 %
Platelets: 227 10*3/uL (ref 140–400)
RBC: 4.3 10*6/uL (ref 3.80–5.10)
RDW: 12.2 % (ref 11.0–15.0)
Total Lymphocyte: 33.2 %
WBC: 4.6 10*3/uL (ref 3.8–10.8)

## 2019-02-13 LAB — URINALYSIS, ROUTINE W REFLEX MICROSCOPIC
Bilirubin Urine: NEGATIVE
Glucose, UA: NEGATIVE
Hgb urine dipstick: NEGATIVE
Hyaline Cast: NONE SEEN /LPF
Ketones, ur: NEGATIVE
Nitrite: NEGATIVE
Protein, ur: NEGATIVE
RBC / HPF: NONE SEEN /HPF (ref 0–2)
Specific Gravity, Urine: 1.013 (ref 1.001–1.03)
Squamous Epithelial / HPF: NONE SEEN /HPF (ref <=5)
pH: 6 (ref 5.0–8.0)

## 2019-02-13 LAB — IRON, TOTAL/TOTAL IRON BINDING CAP
%SAT: 22 % (calc) (ref 16–45)
Iron: 67 ug/dL (ref 45–160)
TIBC: 306 mcg/dL (calc) (ref 250–450)

## 2019-02-13 LAB — VITAMIN D 25 HYDROXY (VIT D DEFICIENCY, FRACTURES): Vit D, 25-Hydroxy: 39 ng/mL (ref 30–100)

## 2019-02-13 LAB — COMPLETE METABOLIC PANEL WITH GFR
AG Ratio: 2.2 (calc) (ref 1.0–2.5)
ALT: 15 U/L (ref 6–29)
AST: 19 U/L (ref 10–35)
Albumin: 4.3 g/dL (ref 3.6–5.1)
Alkaline phosphatase (APISO): 58 U/L (ref 37–153)
BUN: 12 mg/dL (ref 7–25)
CO2: 29 mmol/L (ref 20–32)
Calcium: 9.3 mg/dL (ref 8.6–10.4)
Chloride: 108 mmol/L (ref 98–110)
Creat: 0.53 mg/dL (ref 0.50–0.99)
GFR, Est African American: 119 mL/min/{1.73_m2} (ref >=60)
GFR, Est Non African American: 102 mL/min/{1.73_m2} (ref >=60)
Globulin: 2 g/dL (calc) (ref 1.9–3.7)
Glucose, Bld: 99 mg/dL (ref 65–99)
Potassium: 4.1 mmol/L (ref 3.5–5.3)
Sodium: 144 mmol/L (ref 135–146)
Total Bilirubin: 0.4 mg/dL (ref 0.2–1.2)
Total Protein: 6.3 g/dL (ref 6.1–8.1)

## 2019-02-13 LAB — SAR COV2 SEROLOGY (COVID19)AB(IGG),IA: SARS CoV2 AB IGG: POSITIVE — AB

## 2019-02-13 LAB — VITAMIN B12: Vitamin B-12: 1047 pg/mL (ref 200–1100)

## 2019-02-13 LAB — MAGNESIUM: Magnesium: 2.1 mg/dL (ref 1.5–2.5)

## 2019-02-13 LAB — MICROALBUMIN / CREATININE URINE RATIO
Creatinine, Urine: 70 mg/dL (ref 20–275)
Microalb Creat Ratio: 7 mcg/mg creat (ref <30)
Microalb, Ur: 0.5 mg/dL

## 2019-02-13 LAB — TSH: TSH: 1.4 mIU/L (ref 0.40–4.50)

## 2019-02-13 LAB — AMYLASE: Amylase: 33 U/L (ref 21–101)

## 2019-02-13 LAB — FERRITIN: Ferritin: 143 ng/mL (ref 16–288)

## 2019-02-14 MED ORDER — AMITRIPTYLINE HCL 50 MG PO TABS: 50.0000 mg | ORAL_TABLET | Freq: Every evening | ORAL | 0 refills | Status: DC | PRN

## 2019-03-21 MED ORDER — ALPRAZOLAM 1 MG PO TABS: ORAL_TABLET | ORAL | 0 refills | Status: DC

## 2019-04-05 ENCOUNTER — Other Ambulatory Visit: Payer: Self-pay | Admitting: Obstetrics & Gynecology

## 2019-04-05 ENCOUNTER — Telehealth: Payer: Self-pay | Admitting: Obstetrics & Gynecology

## 2019-04-05 NOTE — Telephone Encounter (Signed)
Spoke with patient. States valacyclovir 1000 mg no longer covered by plan, monthly out of pocket cost $85, request alternative to [place]. Patient states she currently takes 1/2 of a tablet daily. Patient states she was advised acyclovir is covered by plan.   Reviewed option of Good Rx if patient wants to continue current Rx, patient declines, request alternative. She states she has plenty of current Rx, request that Rx put on file and she will notify RX when she needs it. Advised I will review with Dr. Sabra Heck and f/u with recommendations, patient agreeable.   Dr. Sabra Heck -please advise on alternative.

## 2019-04-05 NOTE — Telephone Encounter (Signed)
Patient called regarding her refill request that had been denied. She was told by her pharmacy that her prescription is no longer covered by her insurance,

## 2019-04-06 NOTE — Telephone Encounter (Signed)
Treatment for suppressive therapy with acyclovir is 400mg  BID and 400mg  TID x 5 days with outbreak.  Ok to send in rx for pt stating acyclovir 400mg  bid, with symptoms increase to TID x 5 days.  If needs 90 day supply, will need 180.  It's much easier to take Valtrex and I check the good rx coupon and 90 tablets of valtrex is $38 at [facility].  This would last her 6 months, just FYI for her in decision making.

## 2019-04-06 NOTE — Telephone Encounter (Signed)
Spoke with patient, advised per Dr. Sabra Heck. Patient states she would like to wait until next refill is due, she will call and request new Rx for 90 day supply of Valtrex. Declines Rx at this time. Patient is thankful for assistance.   Routing to provider for final review. Patient is agreeable to disposition. Will close encounter.

## 2019-05-01 ENCOUNTER — Ambulatory Visit: Payer: 59 | Admitting: Obstetrics & Gynecology

## 2019-05-22 NOTE — Progress Notes (Signed)
Folllow up  Assessment and Plan:  Abnormal glucose Discussed disease progression and risks Discussed diet/exercise, weight management and risk modification  Mixed hyperlipidemia -     TSH -     Lipid panel check lipids decrease fatty foods increase activity.   Medication management -     CBC with Differential/Platelet -     COMPLETE METABOLIC PANEL WITH GFR -     Magnesium  B12 deficiency Check   Vitamin D deficiency -     VITAMIN D 25 Hydroxy (Vit-D Deficiency, Fractures)  Palpitations- no accompaniments, non exertional -     ALPRAZolam (XANAX) 0.25 MG tablet; TAKE 1 TABLET AS NEEDED AT NIGHT FOR SLEEP -     HOLTER MONITOR - 48 HOUR; Future -     EKG 12-Lead -     diltiazem (CARDIZEM) 30 MG tablet; Take 1 tablet (30 mg total) by mouth 2 (two) times daily as needed (palpitations).EKG normal  check labs, r/u anemia, TSH, dehydration, electrolytes imbalance  such as alcohol, smoking, caffeine etc, Valsalva taught, call if worsening palpations, dizziness, CP, SOB. Add on pill in the pocket diltiazem   Recurrent UTI -     Urinalysis, Routine w reflex microscopic -     Urine Culture -     MICROSCOPIC MESSAGE ? Contributing to palpitations    Discussed med's effects and SE's. Screening labs and tests as requested with regular follow-up as recommended. Future Appointments  Date Time Provider [place]  01/01/2020  3:30 PM Megan Salon, MD [facility] None  02/12/2020  3:00 PM Vicie Mutters, PA-C [id number] None    HPI  64 y.o. female  Presents for follow up for cholesterol, HTN.  Her blood pressure has been controlled at home, today their BP is BP: 120/70 She does workout. She denies chest pain, shortness of breath.   She has been having heart "fluttering" for last few months off and on, 2-3 times a week, has woken her up before, non exertional. Got her second COVID shot April 13, noticed it before that but feels it has gotten worse since. Last for 2-3 mintues  but can be off and on all day, no accompaniments.   She was on palpitations medication in the past, she did a stress test 10 + years ago. Dad with bypass mid 16's, mom with CHF age 65.   She is following with Dr. Loletha Carrow and she is currently taking omeprazole 40 mg trying to do every other day. .    She could not tolerate trazodone and elavil, has been on 0.5mg  nightly, occ she does not.   She is on hormone replacement, tried to stop 1 week but had hot flashes so started back on 1/2 every other day. Started on premarin cream.    She is on cholesterol medication. She is doing 10 mg every day. Her cholesterol is at goal. The cholesterol last visit was:  Lab Results  Component Value Date   CHOL 164 05/23/2019   HDL 58 05/23/2019   LDLCALC 92 05/23/2019   TRIG 62 05/23/2019   CHOLHDL 2.8 05/23/2019   She has been working on diet and exercise for prediabetes, she is on bASA, she is not on ACE/ARB and denies foot ulcerations, hyperglycemia, hypoglycemia , increased appetite, nausea, paresthesia of the feet, polydipsia, polyuria, visual disturbances, vomiting and weight loss. Last A1C in the office was:  Lab Results  Component Value Date   HGBA1C 5.6 08/29/2018   Patient is on Vitamin D supplement.  Lab Results  Component Value Date   VD25OH 42 05/23/2019     She has had hematuria and recurrent UTI, but had kidney stone s/p lithotripsy and is on the premarin cream and has been better. Follows with [facility] urology, last [place] 04/2019.  BMI is Body mass index is 18.66 kg/m., she is working on diet and exercise. Wt Readings from Last 3 Encounters:  05/23/19 107 lb (48.5 kg)  02/12/19 109 lb (49.4 kg)  02/02/19 108 lb 3.2 oz (49.1 kg)   .    Current Medications:   Current Outpatient Medications (Endocrine & Metabolic):  .  estradiol (ESTRACE) 0.5 MG tablet, TAKE 1 TABLET BY MOUTH EVERY DAY .  medroxyPROGESTERone (PROVERA) 2.5 MG tablet, TAKE 1 TABLET BY MOUTH EVERY DAY  Current Outpatient  Medications (Cardiovascular):  .  rosuvastatin (CRESTOR) 20 MG tablet, Take 1 tablet Daily for Cholesterol .  diltiazem (CARDIZEM) 30 MG tablet, Take 1 tablet (30 mg total) by mouth 2 (two) times daily as needed (palpitations).   Current Outpatient Medications (Analgesics):  .  diclofenac (VOLTAREN) 50 MG EC tablet, Take 50 mg by mouth 2 (two) times daily.  Current Outpatient Medications (Hematological):  Marland Kitchen  Cyanocobalamin (B-12 PO), Take by mouth.  Current Outpatient Medications (Other):  Marland Kitchen  ALPRAZolam (XANAX) 0.25 MG tablet, TAKE 1 TABLET AS NEEDED AT NIGHT FOR SLEEP .  Cholecalciferol (VITAMIN D-3 PO), Take 1 capsule by mouth daily. Marland Kitchen  docusate sodium (COLACE) 100 MG capsule, Take 100 mg by mouth 2 (two) times daily. Marland Kitchen  omeprazole (PRILOSEC) 40 MG capsule, Take 1 capsule (40 mg total) by mouth daily. Take at supper meal .  oxybutynin (DITROPAN) 5 MG tablet, Take 1 tablet (5 mg total) by mouth 3 (three) times daily. .  polyethylene glycol powder (GLYCOLAX/MIRALAX) powder, MIX 17 GRAMS AS DIRECTED TO TAKE BY MOUTH TWICE A DAY AS DIRECTED .  valACYclovir (VALTREX) 1000 MG tablet, Take 1 tablet (1,000 mg total) by mouth daily.  Medical History:  Past Medical History:  Diagnosis Date  . Allergy   . Anal fissure   . ANAL FISSURE, HX OF 09/24/2008   Qualifier: Diagnosis of  By: Tamala Julian CMA, Claiborne Billings    . Anxiety   . Fibroids    Uterine  . GERD (gastroesophageal reflux disease)   . HSV-2 (herpes simplex virus 2) infection   . Hyperlipidemia   . IBS (irritable bowel syndrome)   . Interstitial cystitis   . Kidney stone 2019   --Dr.Evans  . Migraines   . Nevus    vulva nevus  . Prediabetes    Allergies Allergies  Allergen Reactions  . Sulfa Antibiotics Hives, Shortness Of Breath and Rash  . Macrobid [Nitrofurantoin Macrocrystal] Nausea Only    SURGICAL HISTORY She  has a past surgical history that includes Cholecystectomy; tubal reconstruction; Bladder repair; Tonsillectomy;  Vaginal delivery; laparoscopy (1985/ 1990); tubalplasty (1985/ 1990 ); Bunionectomy; and Lithotripsy. FAMILY HISTORY Her family history includes Breast cancer in her paternal grandmother; Cancer (age of onset: 31) in her sister; Celiac disease in her mother; Diabetes in her paternal grandmother; Heart disease in her father and sister; Irritable bowel syndrome in her sister; Kidney disease in an other family member; Lung cancer in her maternal grandmother; Pancreatic cancer in her maternal grandmother; Thyroid disease in her sister. SOCIAL HISTORY She  reports that she quit smoking about 7 years ago. Her smoking use included cigarettes. She has never used smokeless tobacco. She reports current alcohol use of about 5.0  standard drinks of alcohol per week. She reports that she does not use drugs.  Review of Systems: Review of Systems  Constitutional: Negative for chills, fever and malaise/fatigue.  HENT: Negative for congestion, ear pain and sore throat.   Eyes: Negative.   Respiratory: Negative for cough, shortness of breath and wheezing.   Cardiovascular: Negative for chest pain, palpitations and leg swelling.  Gastrointestinal: Negative for abdominal pain, blood in stool, constipation, diarrhea, heartburn and melena.  Genitourinary: Negative.   Skin: Negative.   Neurological: Negative for dizziness, sensory change, loss of consciousness and headaches.  Psychiatric/Behavioral: Negative for depression. The patient is not nervous/anxious and does not have insomnia.     Physical Exam: Estimated body mass index is 18.66 kg/m as calculated from the following:   Height as of 02/12/19: 5' 3.5" (1.613 m).   Weight as of this encounter: 107 lb (48.5 kg). BP 120/70   Pulse 68   Temp (!) 97.3 F (36.3 C)   Wt 107 lb (48.5 kg)   LMP 01/20/2011   SpO2 98%   BMI 18.66 kg/m   General Appearance: Well nourished well developed, in no apparent distress.  Eyes: PERRLA, EOMs, conjunctiva no swelling or  erythema ENT/Mouth: Ear canals normal without obstruction, swelling, erythema, or discharge.  TMs normal bilaterally with no erythema, bulging, retraction, or loss of landmark.  Oropharynx moist and clear with no exudate, erythema, or swelling.   Neck: Supple, thyroid normal. No bruits.  No cervical adenopathy Respiratory: Respiratory effort normal, Breath sounds clear A&P without wheeze, rhonchi, rales.   Cardio: RRR without murmurs, rubs or gallops. Brisk peripheral pulses without edema.  Chest: symmetric, with normal excursions Abdomen: Soft, nontender, no guarding, rebound, hernias, masses, or organomegaly.  Lymphatics: Non tender without lymphadenopathy.  Musculoskeletal: Full ROM all peripheral extremities,5/5 strength, and normal gait.  Skin: Warm, dry without rashes, lesions, ecchymosis. Neuro: Awake and oriented X 3, Cranial nerves intact, reflexes equal bilaterally. Normal muscle tone, no cerebellar symptoms. Sensation intact.  Psych:  normal affect, Insight and Judgment appropriate.   Vicie Mutters 8:54 AM [facility]

## 2019-05-23 ENCOUNTER — Other Ambulatory Visit: Payer: Self-pay

## 2019-05-23 ENCOUNTER — Encounter: Payer: Self-pay | Admitting: Physician Assistant

## 2019-05-23 ENCOUNTER — Ambulatory Visit: Payer: 59 | Admitting: Physician Assistant

## 2019-05-23 VITALS — BP 120/70 | HR 68 | Temp 97.3°F | Wt 107.0 lb

## 2019-05-23 DIAGNOSIS — N390 Urinary tract infection, site not specified: Secondary | ICD-10-CM

## 2019-05-23 DIAGNOSIS — Z79899 Other long term (current) drug therapy: Secondary | ICD-10-CM | POA: Diagnosis not present

## 2019-05-23 DIAGNOSIS — R002 Palpitations: Secondary | ICD-10-CM | POA: Diagnosis not present

## 2019-05-23 DIAGNOSIS — R7309 Other abnormal glucose: Secondary | ICD-10-CM | POA: Diagnosis not present

## 2019-05-23 DIAGNOSIS — E782 Mixed hyperlipidemia: Secondary | ICD-10-CM

## 2019-05-23 DIAGNOSIS — E538 Deficiency of other specified B group vitamins: Secondary | ICD-10-CM | POA: Diagnosis not present

## 2019-05-23 DIAGNOSIS — E559 Vitamin D deficiency, unspecified: Secondary | ICD-10-CM

## 2019-05-23 MED ORDER — ALPRAZOLAM 0.25 MG PO TABS: ORAL_TABLET | ORAL | 2 refills | Status: DC

## 2019-05-23 MED ORDER — DILTIAZEM HCL 30 MG PO TABS: 30.0000 mg | ORAL_TABLET | Freq: Two times a day (BID) | ORAL | 1 refills | Status: DC | PRN

## 2019-05-23 NOTE — Patient Instructions (Addendum)
Do heating pad on your scalp/jaw  Will get EKG today, will get holter monitor Will get labs.  Get on prilosec daily Go to the ER if any chest pain, shortness of breath, nausea, dizziness, severe HA, changes vision/speech   Palpitations Palpitations are feelings that your heartbeat is irregular or is faster than normal. It may feel like your heart is fluttering or skipping a beat. Palpitations are usually not a serious problem. They may be caused by many things, including smoking, caffeine, alcohol, stress, and certain medicines or drugs. Most causes of palpitations are not serious. However, some palpitations can be a sign of a serious problem. You may need further tests to rule out serious medical problems. Follow these instructions at home:     Pay attention to any changes in your condition. Take these actions to help manage your symptoms: Eating and drinking  Avoid foods and drinks that may cause palpitations. These may include: ? Caffeinated coffee, tea, soft drinks, diet pills, and energy drinks. ? Chocolate. ? Alcohol. Lifestyle  Take steps to reduce your stress and anxiety. Things that can help you relax include: ? Yoga. ? Mind-body activities, such as deep breathing, meditation, or using words and images to create positive thoughts (guided imagery). ? Physical activity, such as swimming, jogging, or walking. Tell your health care provider if your palpitations increase with activity. If you have chest pain or shortness of breath with activity, do not continue the activity until you are seen by your health care provider. ? Biofeedback. This is a method that helps you learn to use your mind to control things in your body, such as your heartbeat.  Do not use drugs, including cocaine or ecstasy. Do not use marijuana.  Get plenty of rest and sleep. Keep a regular bed time. General instructions  Take over-the-counter and prescription medicines only as told by your health care  provider.  Do not use any products that contain nicotine or tobacco, such as cigarettes and e-cigarettes. If you need help quitting, ask your health care provider.  Keep all follow-up visits as told by your health care provider. This is important. These may include visits for further testing if palpitations do not go away or get worse. Contact a health care provider if you:  Continue to have a fast or irregular heartbeat after 24 hours.  Notice that your palpitations occur more often. Get help right away if you:  Have chest pain or shortness of breath.  Have a severe headache.  Feel dizzy or you faint. Summary  Palpitations are feelings that your heartbeat is irregular or is faster than normal. It may feel like your heart is fluttering or skipping a beat.  Palpitations may be caused by many things, including smoking, caffeine, alcohol, stress, certain medicines, and drugs.  Although most causes of palpitations are not serious, some causes can be a sign of a serious medical problem.  Get help right away if you faint or have chest pain, shortness of breath, a severe headache, or dizziness. This information is not intended to replace advice given to you by your health care provider. Make sure you discuss any questions you have with your health care provider. Document Revised: 02/23/2017 Document Reviewed: 02/23/2017 Elsevier Patient Education  2020 [place].   Epidermal Cyst  An epidermal cyst is a sac made of skin tissue. The sac contains a substance called keratin. Keratin is a protein that is normally secreted through the hair follicles. When keratin becomes trapped in the  top layer of skin (epidermis), it can form an epidermal cyst. Epidermal cysts can be found anywhere on your body. These cysts are usually harmless (benign), and they may not cause symptoms unless they become infected. What are the causes? This condition may be caused by:  A blocked hair follicle.  A  hair that curls and re-enters the skin instead of growing straight out of the skin (ingrown hair).  A blocked pore.  Irritated skin.  An injury to the skin.  Certain conditions that are passed along from parent to child (inherited).  Human papillomavirus (HPV).  Long-term (chronic) sun damage to the skin. What increases the risk? The following factors may make you more likely to develop an epidermal cyst:  Having acne.  Being overweight.  Being 50-24 years old. What are the signs or symptoms? The only symptom of this condition may be a small, painless lump underneath the skin. When an epidermal cyst ruptures, it may become infected. Symptoms may include:  Redness.  Inflammation.  Tenderness.  Warmth.  Fever.  Keratin draining from the cyst. Keratin is grayish-white, bad-smelling substance.  Pus draining from the cyst. How is this diagnosed? This condition is diagnosed with a physical exam.  In some cases, you may have a sample of tissue (biopsy) taken from your cyst to be examined under a microscope or tested for bacteria.  You may be referred to a health care provider who specializes in skin care (dermatologist). How is this treated? In many cases, epidermal cysts go away on their own without treatment. If a cyst becomes infected, treatment may include:  Opening and draining the cyst, done by a health care provider. After draining, minor surgery to remove the rest of the cyst may be done.  Antibiotic medicine.  Injections of medicines (steroids) that help to reduce inflammation.  Surgery to remove the cyst. Surgery may be done if the cyst: ? Becomes large. ? Bothers you. ? Has a chance of turning into cancer.  Do not try to open a cyst yourself. Follow these instructions at home:  Take over-the-counter and prescription medicines only as told by your health care provider.  If you were prescribed an antibiotic medicine, take it it as told by your health  care provider. Do not stop using the antibiotic even if you start to feel better.  Keep the area around your cyst clean and dry.  Wear loose, dry clothing.  Avoid touching your cyst.  Check your cyst every day for signs of infection. Check for: ? Redness, swelling, or pain. ? Fluid or blood. ? Warmth. ? Pus or a bad smell.  Keep all follow-up visits as told by your health care provider. This is important. How is this prevented?  Wear clean, dry, clothing.  Avoid wearing tight clothing.  Keep your skin clean and dry. Take showers or baths every day. Contact a health care provider if:  Your cyst develops symptoms of infection.  Your condition is not improving or is getting worse.  You develop a cyst that looks different from other cysts you have had.  You have a fever. Get help right away if:  Redness spreads from the cyst into the surrounding area. Summary  An epidermal cyst is a sac made of skin tissue. These cysts are usually harmless (benign), and they may not cause symptoms unless they become infected.  If a cyst becomes infected, treatment may include surgery to open and drain the cyst, or to remove it. Treatment may also include  medicines by mouth or through an injection.  Take over-the-counter and prescription medicines only as told by your health care provider. If you were prescribed an antibiotic medicine, take it as told by your health care provider. Do not stop using the antibiotic even if you start to feel better.  Contact a health care provider if your condition is not improving or is getting worse.  Keep all follow-up visits as told by your health care provider. This is important. This information is not intended to replace advice given to you by your health care provider. Make sure you discuss any questions you have with your health care provider. Document Revised: 05/04/2018 Document Reviewed: 07/25/2017 Elsevier Patient Education  [place].

## 2019-05-24 ENCOUNTER — Encounter: Payer: Self-pay | Admitting: Physician Assistant

## 2019-05-25 ENCOUNTER — Other Ambulatory Visit: Payer: Self-pay | Admitting: Physician Assistant

## 2019-05-25 LAB — URINALYSIS, ROUTINE W REFLEX MICROSCOPIC
Bilirubin Urine: NEGATIVE
Glucose, UA: NEGATIVE
Hgb urine dipstick: NEGATIVE
Hyaline Cast: NONE SEEN /LPF
Nitrite: POSITIVE — AB
Protein, ur: NEGATIVE
RBC / HPF: NONE SEEN /HPF (ref 0–2)
Specific Gravity, Urine: 1.028 (ref 1.001–1.03)
pH: 5.5 (ref 5.0–8.0)

## 2019-05-25 LAB — LIPID PANEL
Cholesterol: 164 mg/dL (ref <200)
HDL: 58 mg/dL (ref >=50)
LDL Cholesterol (Calc): 92 mg/dL (calc)
Non-HDL Cholesterol (Calc): 106 mg/dL (calc) (ref <130)
Total CHOL/HDL Ratio: 2.8 (calc) (ref <5.0)
Triglycerides: 62 mg/dL (ref <150)

## 2019-05-25 LAB — CBC WITH DIFFERENTIAL/PLATELET
Absolute Monocytes: 502 cells/uL (ref 200–950)
Basophils Absolute: 40 cells/uL (ref 0–200)
Basophils Relative: 0.9 %
Eosinophils Absolute: 92 cells/uL (ref 15–500)
Eosinophils Relative: 2.1 %
HCT: 37.3 % (ref 35.0–45.0)
Hemoglobin: 12.7 g/dL (ref 11.7–15.5)
Lymphs Abs: 1549 cells/uL (ref 850–3900)
MCH: 30.8 pg (ref 27.0–33.0)
MCHC: 34 g/dL (ref 32.0–36.0)
MCV: 90.5 fL (ref 80.0–100.0)
MPV: 10 fL (ref 7.5–12.5)
Monocytes Relative: 11.4 %
Neutro Abs: 2218 cells/uL (ref 1500–7800)
Neutrophils Relative %: 50.4 %
Platelets: 209 10*3/uL (ref 140–400)
RBC: 4.12 10*6/uL (ref 3.80–5.10)
RDW: 12.2 % (ref 11.0–15.0)
Total Lymphocyte: 35.2 %
WBC: 4.4 10*3/uL (ref 3.8–10.8)

## 2019-05-25 LAB — COMPLETE METABOLIC PANEL WITH GFR
AG Ratio: 2.4 (calc) (ref 1.0–2.5)
ALT: 15 U/L (ref 6–29)
AST: 16 U/L (ref 10–35)
Albumin: 4.5 g/dL (ref 3.6–5.1)
Alkaline phosphatase (APISO): 56 U/L (ref 37–153)
BUN: 17 mg/dL (ref 7–25)
CO2: 31 mmol/L (ref 20–32)
Calcium: 9.6 mg/dL (ref 8.6–10.4)
Chloride: 107 mmol/L (ref 98–110)
Creat: 0.62 mg/dL (ref 0.50–0.99)
GFR, Est African American: 112 mL/min/{1.73_m2} (ref >=60)
GFR, Est Non African American: 97 mL/min/{1.73_m2} (ref >=60)
Globulin: 1.9 g/dL (calc) (ref 1.9–3.7)
Glucose, Bld: 89 mg/dL (ref 65–99)
Potassium: 4.2 mmol/L (ref 3.5–5.3)
Sodium: 143 mmol/L (ref 135–146)
Total Bilirubin: 0.3 mg/dL (ref 0.2–1.2)
Total Protein: 6.4 g/dL (ref 6.1–8.1)

## 2019-05-25 LAB — URINE CULTURE
MICRO NUMBER:: 10417143
SPECIMEN QUALITY:: ADEQUATE

## 2019-05-25 LAB — VITAMIN D 25 HYDROXY (VIT D DEFICIENCY, FRACTURES): Vit D, 25-Hydroxy: 42 ng/mL (ref 30–100)

## 2019-05-25 LAB — TSH: TSH: 1.01 mIU/L (ref 0.40–4.50)

## 2019-05-25 LAB — MAGNESIUM: Magnesium: 2.3 mg/dL (ref 1.5–2.5)

## 2019-05-25 MED ORDER — AMOXICILLIN-POT CLAVULANATE 875-125 MG PO TABS: 1.0000 | ORAL_TABLET | Freq: Two times a day (BID) | ORAL | 0 refills | Status: AC

## 2019-05-27 ENCOUNTER — Ambulatory Visit (INDEPENDENT_AMBULATORY_CARE_PROVIDER_SITE_OTHER): Payer: 59

## 2019-05-27 DIAGNOSIS — R002 Palpitations: Secondary | ICD-10-CM | POA: Diagnosis not present

## 2019-07-26 ENCOUNTER — Other Ambulatory Visit: Payer: Self-pay | Admitting: Physician Assistant

## 2019-08-09 ENCOUNTER — Telehealth: Payer: Self-pay | Admitting: Gastroenterology

## 2019-08-09 NOTE — Telephone Encounter (Signed)
Pt states her PCP prescribed prilosec 40mg  in the am but for the last 3 nights she has eaten ice cream before bed and had lots of pressure under breast bone into her right arm. Pt states it is about 2-3 hours prior to her laying down. Encouraged pt to try an OTC prilosec before bed to see if that helped and to keep her scheduled appt. Pt verbalized understandng.

## 2019-08-19 ENCOUNTER — Other Ambulatory Visit: Payer: Self-pay | Admitting: Physician Assistant

## 2019-08-19 DIAGNOSIS — R002 Palpitations: Secondary | ICD-10-CM

## 2019-09-07 ENCOUNTER — Encounter: Payer: Self-pay | Admitting: Gastroenterology

## 2019-09-07 ENCOUNTER — Ambulatory Visit: Payer: 59 | Admitting: Gastroenterology

## 2019-09-07 VITALS — BP 118/62 | HR 63 | Ht 63.0 in | Wt 107.0 lb

## 2019-09-07 DIAGNOSIS — R0789 Other chest pain: Secondary | ICD-10-CM | POA: Diagnosis not present

## 2019-09-07 DIAGNOSIS — R1011 Right upper quadrant pain: Secondary | ICD-10-CM | POA: Diagnosis not present

## 2019-09-07 DIAGNOSIS — R194 Change in bowel habit: Secondary | ICD-10-CM | POA: Diagnosis not present

## 2019-09-07 HISTORY — DX: Other chest pain: R07.89

## 2019-09-07 HISTORY — DX: Change in bowel habit: R19.4

## 2019-09-07 HISTORY — DX: Right upper quadrant pain: R10.11

## 2019-09-07 NOTE — Patient Instructions (Signed)
If you are age 62 or older, your body mass index should be between 23-30. Your Body mass index is 18.95 kg/m. If this is out of the aforementioned range listed, please consider follow up with your Primary Care Provider.  If you are age 68 or younger, your body mass index should be between 19-25. Your Body mass index is 18.95 kg/m. If this is out of the aformentioned range listed, please consider follow up with your Primary Care Provider.   Discontinue Diclofenac.   Start Benefiber or Citrucel 2 teaspoons in 8 ounces of liquid daily and may increase to twice daily if tolerated.   You have been scheduled for an abdominal ultrasound at [facility] (1st floor of hospital) on Monday 09/17/19 at 9:30 am. Please arrive 15 minutes prior to your appointment for registration. Make certain not to have anything to eat or drink 6 hours prior to your appointment. Should you need to reschedule your appointment, please contact radiology at [phone]. This test typically takes about 30 minutes to perform.  You have been scheduled for an endoscopy. Please follow written instructions given to you at your visit today. If you use inhalers (even only as needed), please bring them with you on the day of your procedure.  Due to recent changes in healthcare laws, you may see the results of your imaging and laboratory studies on MyChart before your provider has had a chance to review them.  We understand that in some cases there may be results that are confusing or concerning to you. Not all laboratory results come back in the same time frame and the provider may be waiting for multiple results in order to interpret others.  Please give [place] 48 hours in order for your provider to thoroughly review all the results before contacting the office for clarification of your results.

## 2019-09-07 NOTE — Progress Notes (Signed)
09/07/2019 Patricia Ryan [id number] 1955-08-18   HISTORY OF PRESENT ILLNESS: This is a pleasant 64 year old female who is a patient of Dr. Corena Pilgrim, known to him only for 1 visit back in 2018 for acid reflux related issues.  She is here today on her own accord with complaints of epigastric and right sided abdominal pain as well as right-sided chest pain.  She says that this has been going on for about 6 months, but 2 weeks ago she had episodes of this for a solid week.  She says the pain is in the epigastrium and goes up into her right chest and into her right her arm into her right back.  She is status post cholecystectomy, but says that she feels like this is similar to previous gallbladder pain.  She had her cholecystectomy in the 1990s.  She has been on omeprazole 40 mg daily, and recently added an over-the-counter dose at our recommendation.  She states that she had been eating ice cream every night as well so she wondered if maybe that was causing some gas, but she says she has been eating ice cream every night for a long time.  Since cutting out the ice cream and starting the evening dose of omeprazole she has not had only but one very minor episode in the past 2 weeks.  She does not feel it is classic heartburn or reflux related.  She says it is quite severe and rates it as about an 8 out of 10 on the pain scale when it occurs.  She says that it usually occurs mostly in the evenings so a lot of times she will just take a Xanax which makes her go to sleep and then when she wakes up she is fine.  She also reports altered bowel habits.  She says that she used to have diarrhea all the time.  Now more so she tends to alternate between constipation and loose stools.  She says that sometimes after she eats at a restaurant or something she will have severe diarrhea several times.  Denies any sign of bleeding.   Last EGD and colonoscopy were both in June 2014 at which time she was found to have one  hyperplastic polyp that was removed on colonoscopy, but was otherwise normal.  EGD revealed a normal study as well.  Duodenal biopsies were normal and showed no evidence of celiac disease.  Gastric biopsies showed some reactive gastropathy with no evidence of H. pylori.  Past Medical History:  Diagnosis Date  . Allergy   . Anal fissure   . ANAL FISSURE, HX OF 09/24/2008   Qualifier: Diagnosis of  By: Tamala Julian CMA, Claiborne Billings    . Anxiety   . Fibroids    Uterine  . GERD (gastroesophageal reflux disease)   . HSV-2 (herpes simplex virus 2) infection   . Hyperlipidemia   . IBS (irritable bowel syndrome)   . Interstitial cystitis   . Kidney stone 2019   --Dr.Evans  . Migraines   . Nevus    vulva nevus  . Prediabetes    Past Surgical History:  Procedure Laterality Date  . BLADDER REPAIR    . BUNIONECTOMY    . CHOLECYSTECTOMY    . LAPAROSCOPY  1985/ 1990   due to infertility  . LITHOTRIPSY    . TONSILLECTOMY    . tubal reconstruction    . tubalplasty  1985/ 1990   . VAGINAL DELIVERY     x2  reports that she quit smoking about 7 years ago. Her smoking use included cigarettes. She has never used smokeless tobacco. She reports current alcohol use of about 5.0 standard drinks of alcohol per week. She reports that she does not use drugs. family history includes Breast cancer in her paternal grandmother; Cancer (age of onset: 28) in her sister; Celiac disease in her mother; Diabetes in her paternal grandmother; Heart disease in her father and sister; Irritable bowel syndrome in her sister; Kidney disease in an other family member; Lung cancer in her maternal grandmother; Pancreatic cancer in her maternal grandmother; Thyroid disease in her sister. Allergies  Allergen Reactions  . Sulfa Antibiotics Hives, Shortness Of Breath and Rash  . Macrobid [Nitrofurantoin Macrocrystal] Nausea Only      Outpatient Encounter Medications as of 09/07/2019  Medication Sig  . ALPRAZolam (XANAX) 0.25 MG  tablet TAKE ONE TABLET BY MOUTH AT NIGHT AS NEEDED FOR SLEEP  . Cholecalciferol (VITAMIN D-3 PO) Take 1 capsule by mouth daily.  . Cyanocobalamin (B-12 PO) Take by mouth.  . diclofenac (VOLTAREN) 50 MG EC tablet Take 50 mg by mouth 2 (two) times daily.  Marland Kitchen estradiol (ESTRACE) 0.5 MG tablet TAKE 1 TABLET BY MOUTH EVERY DAY  . medroxyPROGESTERone (PROVERA) 2.5 MG tablet TAKE 1 TABLET BY MOUTH EVERY DAY  . omeprazole (PRILOSEC) 40 MG capsule Take 1 capsule Daily to Prevent Heartburn & Indigestion  . oxybutynin (DITROPAN-XL) 10 MG 24 hr tablet Take 10 mg by mouth at bedtime.  . polyethylene glycol powder (GLYCOLAX/MIRALAX) powder MIX 17 GRAMS AS DIRECTED TO TAKE BY MOUTH TWICE A DAY AS DIRECTED  . rosuvastatin (CRESTOR) 20 MG tablet Take 1 tablet Daily for Cholesterol  . valACYclovir (VALTREX) 1000 MG tablet Take 1 tablet (1,000 mg total) by mouth daily.  . [DISCONTINUED] diltiazem (CARDIZEM) 30 MG tablet Take 1 tablet (30 mg total) by mouth 2 (two) times daily as needed (palpitations).  . [DISCONTINUED] docusate sodium (COLACE) 100 MG capsule Take 100 mg by mouth 2 (two) times daily.  . [DISCONTINUED] oxybutynin (DITROPAN) 5 MG tablet Take 1 tablet (5 mg total) by mouth 3 (three) times daily.   No facility-administered encounter medications on file as of 09/07/2019.     REVIEW OF SYSTEMS  : All other systems reviewed and negative except where noted in the History of Present Illness.   PHYSICAL EXAM: BP 118/62   Pulse 63   Ht 5\' 3"  (1.6 m)   Wt 107 lb (48.5 kg)   LMP 01/20/2011   BMI 18.95 kg/m  General: Well developed white female in no acute distress Head: Normocephalic and atraumatic Eyes:  Sclerae anicteric, conjunctiva pink. Ears: Normal auditory acuity Lungs: Clear throughout to auscultation; no increased WOB. Heart: Regular rate and rhythm; no M/R/G. Abdomen: Soft, non-distended.  BS present.  Non-tender. Musculoskeletal: Symmetrical with no gross deformities  Skin: No lesions  on visible extremities Extremities: No edema  Neurological: Alert oriented x 4, grossly non-focal Psychological:  Alert and cooperative. Normal mood and affect  ASSESSMENT AND PLAN: *Epigastric/right upper quadrant abdominal pain/atypical right-sided chest pain that radiates into the right side of her back and her right arm: Quite painful, 8/10, when it occurs.  She is status post cholecystectomy several years ago.  She does not feel that it is necessarily acid related.  She has been on omeprazole 40 mg daily every morning and recently added an over-the-counter dose in the evenings.  She discontinued eating ice cream in the evenings as well.  Since adding the evening dose and discontinuing the ice cream episodes have been less frequent.  I have asked her to discontinue her diclofenac.  We will plan for right upper quadrant abdominal ultrasound as well as EGD with Dr. Loletha Carrow.  The risks, benefits, and alternatives to EGD were discussed with the patient and she consents to proceed.  *Altered bowel habits: Previously had chronic diarrhea (maybe related to her cholecystectomy especially when post-prandial).  Now more so seems to alternate between constipation and diarrhea.  Reports history of IBS diagnosis.  I have asked her to begin taking a daily powder fiber supplement such as Benefiber, starting with 2 teaspoons mixed in 8 ounces of liquid daily and increase to twice daily after couple of weeks if tolerated.  CC:  Unk Pinto, MD

## 2019-09-10 NOTE — Progress Notes (Signed)
____________________________________________________________  Attending physician addendum:  Thank you for sending this case to me. I have reviewed the entire note, and the outlined plan seems appropriate.  Last hepatic function panel in April 2021 normal, but I will send her for another panel when comes for [place] (if elevated, would increase suspicion of CBD stone)  Wilfrid Lund, MD  ____________________________________________________________

## 2019-09-10 NOTE — Progress Notes (Signed)
Folllow up  Assessment and Plan:  Abnormal glucose Discussed disease progression and risks Discussed diet/exercise, weight management and risk modification  Mixed hyperlipidemia -     TSH -     Lipid panel check lipids decrease fatty foods increase activity.   Medication management -     CBC with Differential/Platelet -     COMPLETE METABOLIC PANEL WITH GFR -     Magnesium  B12 deficiency Check   Vitamin D deficiency -     VITAMIN D 25 Hydroxy (Vit-D Deficiency, Fractures)   Discussed med's effects and SE's. Screening labs and tests as requested with regular follow-up as recommended. Future Appointments  Date Time Provider [place]  09/14/2019  9:30 AM Doran Stabler, MD [facility]  09/17/2019  9:30 AM [facility]-US 2 MC-US [facility]  01/01/2020  3:30 PM Megan Salon, MD [place] None  02/12/2020  3:00 PM Vicie Mutters, PA-C [facility] None    HPI  64 y.o. female  Presents for follow up for cholesterol, HTN.  Her blood pressure has been controlled at home, today their BP is BP: 128/80 She does workout. She denies chest pain, shortness of breath.  She had a normal holter with PVC/PAC. She has been stressed with her daughter, Larene Beach, moving her grand kids she normally watches daily an hour and half away.   She is following with Dr. Loletha Carrow, going to get EGD/colonscopy and she is currently taking omeprazole 40 mg.   She could not tolerate trazodone and elavil, has been on 0.5mg  nightly, occ she does not.   She is on hormone replacement, tried to stop 1 week but had hot flashes so started back on 1/2 every other day. Started on premarin cream.    She is on cholesterol medication. She is doing 10 mg every day. Her cholesterol is at goal. The cholesterol last visit was:  Lab Results  Component Value Date   CHOL 164 05/23/2019   HDL 58 05/23/2019   LDLCALC 92 05/23/2019   TRIG 62 05/23/2019   CHOLHDL 2.8 05/23/2019   She has been working on diet and exercise for  prediabetes, she is on bASA, she is not on ACE/ARB and denies foot ulcerations, hyperglycemia, hypoglycemia , increased appetite, nausea, paresthesia of the feet, polydipsia, polyuria, visual disturbances, vomiting and weight loss. Last A1C in the office was:  Lab Results  Component Value Date   HGBA1C 5.6 08/29/2018   Patient is on Vitamin D supplement, 2000.   Lab Results  Component Value Date   VD25OH 42 05/23/2019     She has had hematuria and recurrent UTI, but had kidney stone s/p lithotripsy and is on the premarin cream and has been better. Follows with [facility] urology, last [place] 04/2019.  BMI is Body mass index is 19.31 kg/m., she is working on diet and exercise. Wt Readings from Last 3 Encounters:  09/11/19 109 lb (49.4 kg)  09/07/19 107 lb (48.5 kg)  05/23/19 107 lb (48.5 kg)   .    Current Medications:   Current Outpatient Medications (Endocrine & Metabolic):    estradiol (ESTRACE) 0.5 MG tablet, TAKE 1 TABLET BY MOUTH EVERY DAY   medroxyPROGESTERone (PROVERA) 2.5 MG tablet, TAKE 1 TABLET BY MOUTH EVERY DAY  Current Outpatient Medications (Cardiovascular):    rosuvastatin (CRESTOR) 20 MG tablet, Take 1 tablet Daily for Cholesterol    Current Outpatient Medications (Hematological):    Cyanocobalamin (B-12 PO), Take by mouth.  Current Outpatient Medications (Other):  ALPRAZolam (XANAX) 0.25 MG tablet, TAKE ONE TABLET BY MOUTH AT NIGHT AS NEEDED FOR SLEEP   Cholecalciferol (VITAMIN D-3 PO), Take 1 capsule by mouth daily.   omeprazole (PRILOSEC) 40 MG capsule, Take 1 capsule Daily to Prevent Heartburn & Indigestion   oxybutynin (DITROPAN-XL) 10 MG 24 hr tablet, Take 10 mg by mouth at bedtime.   polyethylene glycol powder (GLYCOLAX/MIRALAX) powder, MIX 17 GRAMS AS DIRECTED TO TAKE BY MOUTH TWICE A DAY AS DIRECTED   valACYclovir (VALTREX) 1000 MG tablet, Take 1 tablet (1,000 mg total) by mouth daily.  Medical History:  Past Medical History:  Diagnosis Date    Allergy    Anal fissure    ANAL FISSURE, HX OF 09/24/2008   Qualifier: Diagnosis of  By: Tamala Julian CMA, Claiborne Billings     Anxiety    Fibroids    Uterine   GERD (gastroesophageal reflux disease)    HSV-2 (herpes simplex virus 2) infection    Hyperlipidemia    IBS (irritable bowel syndrome)    Interstitial cystitis    Kidney stone 2019   --Dr.Evans   Migraines    Nevus    vulva nevus   Prediabetes    Allergies Allergies  Allergen Reactions   Sulfa Antibiotics Hives, Shortness Of Breath and Rash   Macrobid [Nitrofurantoin Macrocrystal] Nausea Only    SURGICAL HISTORY She  has a past surgical history that includes Cholecystectomy; tubal reconstruction; Bladder repair; Tonsillectomy; Vaginal delivery; laparoscopy (1985/ 1990); tubalplasty (1985/ 1990 ); Bunionectomy; and Lithotripsy. FAMILY HISTORY Her family history includes Breast cancer in her paternal grandmother; Cancer (age of onset: 72) in her sister; Celiac disease in her mother; Diabetes in her paternal grandmother; Heart disease in her father and sister; Irritable bowel syndrome in her sister; Kidney disease in an other family member; Lung cancer in her maternal grandmother; Pancreatic cancer in her maternal grandmother; Thyroid disease in her sister. SOCIAL HISTORY She  reports that she quit smoking about 7 years ago. Her smoking use included cigarettes. She has never used smokeless tobacco. She reports current alcohol use of about 5.0 standard drinks of alcohol per week. She reports that she does not use drugs.  Review of Systems: Review of Systems  Constitutional: Negative for chills, fever and malaise/fatigue.  HENT: Negative for congestion, ear pain and sore throat.   Eyes: Negative.   Respiratory: Negative for cough, shortness of breath and wheezing.   Cardiovascular: Negative for chest pain, palpitations and leg swelling.  Gastrointestinal: Negative for abdominal pain, blood in stool, constipation, diarrhea,  heartburn and melena.  Genitourinary: Negative.   Skin: Negative.   Neurological: Negative for dizziness, sensory change, loss of consciousness and headaches.  Psychiatric/Behavioral: Negative for depression. The patient is not nervous/anxious and does not have insomnia.     Physical Exam: Estimated body mass index is 19.31 kg/m as calculated from the following:   Height as of this encounter: 5\' 3"  (1.6 m).   Weight as of this encounter: 109 lb (49.4 kg). BP 128/80    Pulse 74    Temp 98.6 F (37 C)    Ht 5\' 3"  (1.6 m)    Wt 109 lb (49.4 kg)    LMP 01/20/2011    SpO2 96%    BMI 19.31 kg/m   General Appearance: Well nourished well developed, in no apparent distress.  Eyes: PERRLA, EOMs, conjunctiva no swelling or erythema ENT/Mouth: Ear canals normal without obstruction, swelling, erythema, or discharge.  TMs normal bilaterally with no erythema, bulging, retraction,  or loss of landmark.  Oropharynx moist and clear with no exudate, erythema, or swelling.   Neck: Supple, thyroid normal. No bruits.  No cervical adenopathy Respiratory: Respiratory effort normal, Breath sounds clear A&P without wheeze, rhonchi, rales.   Cardio: RRR without murmurs, rubs or gallops. Brisk peripheral pulses without edema.  Chest: symmetric, with normal excursions Abdomen: Soft, nontender, no guarding, rebound, hernias, masses, or organomegaly.  Lymphatics: Non tender without lymphadenopathy.  Musculoskeletal: Full ROM all peripheral extremities,5/5 strength, and normal gait.  Skin: Warm, dry without rashes, lesions, ecchymosis. Neuro: Awake and oriented X 3, Cranial nerves intact, reflexes equal bilaterally. Normal muscle tone, no cerebellar symptoms. Sensation intact.  Psych:  normal affect, Insight and Judgment appropriate.   Vicie Mutters 11:48 AM [facility]

## 2019-09-11 ENCOUNTER — Ambulatory Visit: Payer: 59 | Admitting: Physician Assistant

## 2019-09-11 ENCOUNTER — Encounter: Payer: Self-pay | Admitting: Physician Assistant

## 2019-09-11 ENCOUNTER — Other Ambulatory Visit: Payer: Self-pay

## 2019-09-11 VITALS — BP 128/80 | HR 74 | Temp 98.6°F | Ht 63.0 in | Wt 109.0 lb

## 2019-09-11 DIAGNOSIS — E782 Mixed hyperlipidemia: Secondary | ICD-10-CM

## 2019-09-11 DIAGNOSIS — Z79899 Other long term (current) drug therapy: Secondary | ICD-10-CM

## 2019-09-11 DIAGNOSIS — E559 Vitamin D deficiency, unspecified: Secondary | ICD-10-CM

## 2019-09-11 DIAGNOSIS — E538 Deficiency of other specified B group vitamins: Secondary | ICD-10-CM | POA: Diagnosis not present

## 2019-09-11 DIAGNOSIS — R7309 Other abnormal glucose: Secondary | ICD-10-CM | POA: Diagnosis not present

## 2019-09-12 LAB — COMPLETE METABOLIC PANEL WITH GFR
AG Ratio: 2.2 (calc) (ref 1.0–2.5)
ALT: 12 U/L (ref 6–29)
AST: 16 U/L (ref 10–35)
Albumin: 4.7 g/dL (ref 3.6–5.1)
Alkaline phosphatase (APISO): 63 U/L (ref 37–153)
BUN: 11 mg/dL (ref 7–25)
CO2: 28 mmol/L (ref 20–32)
Calcium: 9.8 mg/dL (ref 8.6–10.4)
Chloride: 105 mmol/L (ref 98–110)
Creat: 0.59 mg/dL (ref 0.50–0.99)
GFR, Est African American: 114 mL/min/{1.73_m2} (ref >=60)
GFR, Est Non African American: 98 mL/min/{1.73_m2} (ref >=60)
Globulin: 2.1 g/dL (calc) (ref 1.9–3.7)
Glucose, Bld: 93 mg/dL (ref 65–99)
Potassium: 4.4 mmol/L (ref 3.5–5.3)
Sodium: 142 mmol/L (ref 135–146)
Total Bilirubin: 0.4 mg/dL (ref 0.2–1.2)
Total Protein: 6.8 g/dL (ref 6.1–8.1)

## 2019-09-12 LAB — CBC WITH DIFFERENTIAL/PLATELET
Absolute Monocytes: 474 cells/uL (ref 200–950)
Basophils Absolute: 41 cells/uL (ref 0–200)
Basophils Relative: 0.8 %
Eosinophils Absolute: 112 cells/uL (ref 15–500)
Eosinophils Relative: 2.2 %
HCT: 41.7 % (ref 35.0–45.0)
Hemoglobin: 13.8 g/dL (ref 11.7–15.5)
Lymphs Abs: 1499 cells/uL (ref 850–3900)
MCH: 29.9 pg (ref 27.0–33.0)
MCHC: 33.1 g/dL (ref 32.0–36.0)
MCV: 90.3 fL (ref 80.0–100.0)
MPV: 10.4 fL (ref 7.5–12.5)
Monocytes Relative: 9.3 %
Neutro Abs: 2973 cells/uL (ref 1500–7800)
Neutrophils Relative %: 58.3 %
Platelets: 223 10*3/uL (ref 140–400)
RBC: 4.62 10*6/uL (ref 3.80–5.10)
RDW: 12.2 % (ref 11.0–15.0)
Total Lymphocyte: 29.4 %
WBC: 5.1 10*3/uL (ref 3.8–10.8)

## 2019-09-12 LAB — LIPID PANEL
Cholesterol: 183 mg/dL (ref <200)
HDL: 66 mg/dL (ref >=50)
LDL Cholesterol (Calc): 100 mg/dL (calc) — ABNORMAL HIGH
Non-HDL Cholesterol (Calc): 117 mg/dL (calc) (ref <130)
Total CHOL/HDL Ratio: 2.8 (calc) (ref <5.0)
Triglycerides: 77 mg/dL (ref <150)

## 2019-09-12 LAB — VITAMIN D 25 HYDROXY (VIT D DEFICIENCY, FRACTURES): Vit D, 25-Hydroxy: 53 ng/mL (ref 30–100)

## 2019-09-12 LAB — MAGNESIUM: Magnesium: 2 mg/dL (ref 1.5–2.5)

## 2019-09-12 LAB — TSH: TSH: 0.67 mIU/L (ref 0.40–4.50)

## 2019-09-14 ENCOUNTER — Other Ambulatory Visit: Payer: Self-pay

## 2019-09-14 ENCOUNTER — Ambulatory Visit (AMBULATORY_SURGERY_CENTER): Payer: 59 | Admitting: Gastroenterology

## 2019-09-14 ENCOUNTER — Encounter: Payer: Self-pay | Admitting: Gastroenterology

## 2019-09-14 VITALS — BP 119/69 | HR 61 | Temp 97.1°F | Resp 11 | Ht 63.0 in | Wt 109.0 lb

## 2019-09-14 DIAGNOSIS — K319 Disease of stomach and duodenum, unspecified: Secondary | ICD-10-CM

## 2019-09-14 DIAGNOSIS — R1013 Epigastric pain: Secondary | ICD-10-CM | POA: Diagnosis not present

## 2019-09-14 DIAGNOSIS — R1011 Right upper quadrant pain: Secondary | ICD-10-CM | POA: Diagnosis present

## 2019-09-14 DIAGNOSIS — K3189 Other diseases of stomach and duodenum: Secondary | ICD-10-CM

## 2019-09-14 DIAGNOSIS — R0789 Other chest pain: Secondary | ICD-10-CM

## 2019-09-14 DIAGNOSIS — R194 Change in bowel habit: Secondary | ICD-10-CM

## 2019-09-14 MED ORDER — SODIUM CHLORIDE 0.9 % IV SOLN: 500.0000 mL | Freq: Once | INTRAVENOUS | Status: DC

## 2019-09-14 NOTE — Progress Notes (Signed)
Called to room to assist during endoscopic procedure.  Patient ID and intended procedure confirmed with present staff. Received instructions for my participation in the procedure from the performing physician.  

## 2019-09-14 NOTE — Op Note (Signed)
[place] Patient Name: Patricia Ryan Procedure Date: 09/14/2019 9:13 AM MRN: [id number] Endoscopist: Mallie Mussel L. Loletha Carrow , MD Age: 64 Referring MD:  Date of Birth: 1955/06/21 Gender: Female Account #: [id number] Procedure:                Upper GI endoscopy Indications:              Abdominal pain in the right upper quadrant (chronic                            post-prandial bloating. more recent episodic                            epigastric pain radiating to RUQ and chest,                            reminiscent of prior biliary colic. s/p chole,                            recent LFTs normal) Medicines:                Monitored Anesthesia Care Procedure:                Pre-Anesthesia Assessment:                           - Prior to the procedure, a History and Physical                            was performed, and patient medications and                            allergies were reviewed. The patient's tolerance of                            previous anesthesia was also reviewed. The risks                            and benefits of the procedure and the sedation                            options and risks were discussed with the patient.                            All questions were answered, and informed consent                            was obtained. Prior Anticoagulants: The patient has                            taken no previous anticoagulant or antiplatelet                            agents. ASA Grade Assessment: II - A patient with  mild systemic disease. After reviewing the risks                            and benefits, the patient was deemed in                            satisfactory condition to undergo the procedure.                           After obtaining informed consent, the endoscope was                            passed under direct vision. Throughout the                            procedure, the patient's blood pressure, pulse, and                             oxygen saturations were monitored continuously. The                            Endoscope was introduced through the mouth, and                            advanced to the second part of duodenum. The upper                            GI endoscopy was accomplished without difficulty.                            The patient tolerated the procedure well. Scope In: Scope Out: Findings:                 The esophagus was normal.                           Patchy mildly erythematous mucosa was found in the                            entire examined stomach. Biopsies were taken with a                            cold forceps for histology. ([place] protocol).                           The exam of the stomach was otherwise normal.                           The examined duodenum was normal. Complications:            No immediate complications. Estimated Blood Loss:     Estimated blood loss was minimal. Impression:               - Normal esophagus.                           -  Erythematous mucosa in the stomach. Biopsied.                           - Normal examined duodenum. Recommendation:           - Patient has a contact number available for                            emergencies. The signs and symptoms of potential                            delayed complications were discussed with the                            patient. Return to normal activities tomorrow.                            Written discharge instructions were provided to the                            patient.                           - Resume previous diet.                           - Continue present medications.                           - Await pathology results. If negative for H.                            pylori, schedule MRI abdomen/MRCP. Jewelle Whitner L. Loletha Carrow, MD 09/14/2019 9:42:44 AM This report has been signed electronically.

## 2019-09-14 NOTE — Patient Instructions (Signed)
YOU HAD AN ENDOSCOPIC PROCEDURE TODAY AT THE [facility]:   Refer to the procedure report that was given to you for any specific questions about what was found during the examination.  If the procedure report does not answer your questions, please call your gastroenterologist to clarify.  If you requested that your care partner not be given the details of your procedure findings, then the procedure report has been included in a sealed envelope for you to review at your convenience later.  YOU SHOULD EXPECT: Some feelings of bloating in the abdomen. Passage of more gas than usual.  Walking can help get rid of the air that was put into your GI tract during the procedure and reduce the bloating. If you had a lower endoscopy (such as a colonoscopy or flexible sigmoidoscopy) you may notice spotting of blood in your stool or on the toilet paper. If you underwent a bowel prep for your procedure, you may not have a normal bowel movement for a few days.  Please Note:  You might notice some irritation and congestion in your nose or some drainage.  This is from the oxygen used during your procedure.  There is no need for concern and it should clear up in a day or so.  SYMPTOMS TO REPORT IMMEDIATELY:    Following upper endoscopy (EGD)  Vomiting of blood or coffee ground material  New chest pain or pain under the shoulder blades  Painful or persistently difficult swallowing  New shortness of breath  Fever of 100F or higher  Black, tarry-looking stools  For urgent or emergent issues, a gastroenterologist can be reached at any hour by calling [phone]. Do not use MyChart messaging for urgent concerns.    DIET:  We do recommend a small meal at first, but then you may proceed to your regular diet.  Drink plenty of fluids but you should avoid alcoholic beverages for 24 hours.  ACTIVITY:  You should plan to take it easy for the rest of today and you should NOT DRIVE or use heavy machinery  until tomorrow (because of the sedation medicines used during the test).    FOLLOW UP: Our staff will call the number listed on your records 48-72 hours following your procedure to check on you and address any questions or concerns that you may have regarding the information given to you following your procedure. If we do not reach you, we will leave a message.  We will attempt to reach you two times.  During this call, we will ask if you have developed any symptoms of COVID 19. If you develop any symptoms (ie: fever, flu-like symptoms, shortness of breath, cough etc.) before then, please call [phone].  If you test positive for Covid 19 in the 2 weeks post procedure, please call and report this information to us.    If any biopsies were taken you will be contacted by phone or by letter within the next 1-3 weeks.  Please call us at [phone] if you have not heard about the biopsies in 3 weeks.    SIGNATURES/CONFIDENTIALITY: You and/or your care partner have signed paperwork which will be entered into your electronic medical record.  These signatures attest to the fact that that the information above on your After Visit Summary has been reviewed and is understood.  Full responsibility of the confidentiality of this discharge information lies with you and/or your care-partner. 

## 2019-09-14 NOTE — Progress Notes (Signed)
pt tolerated well. VSS. awake and to recovery. Report given to RN. Bite block inserted with ease no trauma. Discussed TMJ prior to insertion and pt expressed comfort as to mouth opening. Bite block left in to recovery.

## 2019-09-14 NOTE — Progress Notes (Signed)
VS taken by C.W. 

## 2019-09-17 ENCOUNTER — Ambulatory Visit (HOSPITAL_COMMUNITY): Payer: 59

## 2019-09-18 ENCOUNTER — Telehealth: Payer: Self-pay

## 2019-09-18 NOTE — Telephone Encounter (Signed)
  Follow up Call-  Call back number 09/14/2019  Post procedure Call Back phone  # [phone]  Permission to leave phone message Yes  Some recent data might be hidden     Patient questions:  Do you have a fever, pain , or abdominal swelling? No. Pain Score  0 *  Have you tolerated food without any problems? Yes.    Have you been able to return to your normal activities? Yes.    Do you have any questions about your discharge instructions: Diet   No. Medications  No. Follow up visit  No.  Do you have questions or concerns about your Care? No.  Actions: * If pain score is 4 or above: No action needed, pain <4.   1. Have you developed a fever since your procedure? No   2.   Have you had an respiratory symptoms (SOB or cough) since your procedure? No   3.   Have you tested positive for COVID 19 since your procedure? No   4.   Have you had any family members/close contacts diagnosed with the COVID 19 since your procedure?  No    If yes to any of these questions please route to Joylene John, RN and Joella Prince, RN

## 2019-09-21 ENCOUNTER — Other Ambulatory Visit: Payer: Self-pay

## 2019-09-21 DIAGNOSIS — R1013 Epigastric pain: Secondary | ICD-10-CM

## 2019-09-21 DIAGNOSIS — R1011 Right upper quadrant pain: Secondary | ICD-10-CM

## 2019-09-24 LAB — HM MAMMOGRAPHY

## 2019-09-27 ENCOUNTER — Ambulatory Visit (HOSPITAL_COMMUNITY): Admission: RE | Admit: 2019-09-27 | Payer: 59 | Source: Ambulatory Visit

## 2019-10-03 ENCOUNTER — Other Ambulatory Visit: Payer: Self-pay | Admitting: Gastroenterology

## 2019-10-03 ENCOUNTER — Ambulatory Visit (HOSPITAL_COMMUNITY)
Admission: RE | Admit: 2019-10-03 | Discharge: 2019-10-03 | Disposition: A | Discharge status: Home / Self Care | Payer: 59 | Source: Ambulatory Visit | Attending: Gastroenterology | Admitting: Gastroenterology

## 2019-10-03 ENCOUNTER — Other Ambulatory Visit: Payer: Self-pay

## 2019-10-03 DIAGNOSIS — R1011 Right upper quadrant pain: Secondary | ICD-10-CM

## 2019-10-03 DIAGNOSIS — R1013 Epigastric pain: Secondary | ICD-10-CM | POA: Diagnosis present

## 2019-10-03 MED ORDER — GADOBUTROL 1 MMOL/ML IV SOLN
5.0000 mL | Freq: Once | INTRAVENOUS | Status: AC | PRN
  Administered 2019-10-03: 5 mL via INTRAVENOUS

## 2019-10-11 ENCOUNTER — Other Ambulatory Visit: Payer: Self-pay

## 2019-10-11 ENCOUNTER — Telehealth: Payer: Self-pay | Admitting: Gastroenterology

## 2019-10-11 DIAGNOSIS — R1011 Right upper quadrant pain: Secondary | ICD-10-CM

## 2019-10-11 DIAGNOSIS — R1013 Epigastric pain: Secondary | ICD-10-CM

## 2019-10-11 MED ORDER — HYOSCYAMINE SULFATE 0.125 MG SL SUBL: 0.1250 mg | SUBLINGUAL_TABLET | SUBLINGUAL | 1 refills | Status: DC | PRN

## 2019-10-11 MED ORDER — HYOSCYAMINE SULFATE 0.125 MG SL SUBL: SUBLINGUAL_TABLET | SUBLINGUAL | 0 refills | Status: DC

## 2019-10-11 NOTE — Telephone Encounter (Signed)
Pharmacy is requesting a daily usage count for hyoscyamine. Please advise. Thank you

## 2019-10-11 NOTE — Telephone Encounter (Signed)
One tablet three times daily as needed. Disp#45, RF 0

## 2019-10-11 NOTE — Telephone Encounter (Signed)
[facility] is requesting a call back for clarification on the pt's LEVSIN. Caller would like to know how many times a day can the pt take the tablet.  [phone]

## 2019-10-11 NOTE — Telephone Encounter (Signed)
Spoke with patient, see 10/03/19 MRI result note for more information

## 2019-10-11 NOTE — Telephone Encounter (Signed)
New Rx has been sent to fix the medication directions

## 2019-10-12 ENCOUNTER — Other Ambulatory Visit: Payer: Self-pay | Admitting: Obstetrics & Gynecology

## 2019-10-12 DIAGNOSIS — N952 Postmenopausal atrophic vaginitis: Secondary | ICD-10-CM

## 2019-10-12 NOTE — Telephone Encounter (Signed)
Medication refill request: Medroxyprogesterone 2.5mg   Last AEX:  12/25/18 Next AEX: 01/01/20 Last MMG (if hormonal medication request): 09/24/19  Neg  Refill authorized: 30/5

## 2019-10-22 ENCOUNTER — Other Ambulatory Visit: Payer: Self-pay | Admitting: Physician Assistant

## 2019-10-22 DIAGNOSIS — R002 Palpitations: Secondary | ICD-10-CM

## 2019-10-25 ENCOUNTER — Encounter: Payer: Self-pay | Admitting: Physician Assistant

## 2019-10-25 ENCOUNTER — Other Ambulatory Visit: Payer: Self-pay

## 2019-10-25 ENCOUNTER — Ambulatory Visit: Payer: 59 | Admitting: Physician Assistant

## 2019-10-25 VITALS — BP 120/70 | HR 73 | Temp 97.3°F | Wt 107.0 lb

## 2019-10-25 DIAGNOSIS — N3000 Acute cystitis without hematuria: Secondary | ICD-10-CM

## 2019-10-25 MED ORDER — CIPROFLOXACIN HCL 500 MG PO TABS: 500.0000 mg | ORAL_TABLET | Freq: Two times a day (BID) | ORAL | 0 refills | Status: AC

## 2019-10-25 NOTE — Patient Instructions (Signed)
Abdominal Pain, Adult Pain in the abdomen (abdominal pain) can be caused by many things. Often, abdominal pain is not serious and it gets better with no treatment or by being treated at home. However, sometimes abdominal pain is serious. Your health care provider will ask questions about your medical history and do a physical exam to try to determine the cause of your abdominal pain. Follow these instructions at home:  Medicines  Take over-the-counter and prescription medicines only as told by your health care provider.  Do not take a laxative unless told by your health care provider. General instructions  Watch your condition for any changes.  Drink enough fluid to keep your urine pale yellow.  Keep all follow-up visits as told by your health care provider. This is important. Contact a health care provider if:  Your abdominal pain changes or gets worse.  You are not hungry or you lose weight without trying.  You are constipated or have diarrhea for more than 2-3 days.  You have pain when you urinate or have a bowel movement.  Your abdominal pain wakes you up at night.  Your pain gets worse with meals, after eating, or with certain foods.  You are vomiting and cannot keep anything down.  You have a fever.  You have blood in your urine. Get help right away if:  Your pain does not go away as soon as your health care provider told you to expect.  You cannot stop vomiting.  Your pain is only in areas of the abdomen, such as the right side or the left lower portion of the abdomen. Pain on the right side could be caused by appendicitis.  You have bloody or black stools, or stools that look like tar.  You have severe pain, cramping, or bloating in your abdomen.  You have signs of dehydration, such as: ? Dark urine, very little urine, or no urine. ? Cracked lips. ? Dry mouth. ? Sunken eyes. ? Sleepiness. ? Weakness.  You have trouble breathing or chest  pain. Summary  Often, abdominal pain is not serious and it gets better with no treatment or by being treated at home. However, sometimes abdominal pain is serious.  Watch your condition for any changes.  Take over-the-counter and prescription medicines only as told by your health care provider.  Contact a health care provider if your abdominal pain changes or gets worse.  Get help right away if you have severe pain, cramping, or bloating in your abdomen. This information is not intended to replace advice given to you by your health care provider. Make sure you discuss any questions you have with your health care provider. Document Revised: 05/22/2018 Document Reviewed: 05/22/2018 Elsevier Patient Education  2020 Elsevier Inc.  

## 2019-10-25 NOTE — Progress Notes (Signed)
Subjective:    Patient ID: Patricia Ryan, female    DOB: 1955/07/04, 64 y.o.   MRN: [id number]  HPI 64 y.o. WF with history of recurrent UTI, history of RLQ pain, history of kidney stones presents with RLQ pain.   Started this AM after getting out of the shower, started to get more intense sharp RLQ pain with some discomfort across her suprapubic area so she took Azo that helped. Lasted for about 2-4 hours. No fever, chills, some nausea due to pain. No dysuria, no urinary frequency.   She has had some right anterior hip pain, will have a "catching" when she sits down to standing. Will follow up with ortho. Had negative hip Xray 2018.   She has been following with Dr. Loletha Carrow for AB pain, had MRCP AB 10/03/2019 that was benign. Did not show a kidney stone.  She has history of hematuria and recurrent UTI, and had kidney stone s/p lithotripsy and was on premarin cream but has not done in a long time. Follows with [facility] urology, last [place] 04/2019.  Blood pressure 120/70, pulse 73, temperature (!) 97.3 F (36.3 C), weight 107 lb (48.5 kg), last menstrual period 01/20/2011, SpO2 96 %.  Medications  Current Outpatient Medications (Endocrine & Metabolic):  .  estradiol (ESTRACE) 0.5 MG tablet, TAKE 1 TABLET BY MOUTH EVERY DAY .  medroxyPROGESTERone (PROVERA) 2.5 MG tablet, TAKE 1 TABLET BY MOUTH EVERY DAY  Current Outpatient Medications (Cardiovascular):  .  rosuvastatin (CRESTOR) 20 MG tablet, Take 1 tablet Daily for Cholesterol    Current Outpatient Medications (Hematological):  Marland Kitchen  Cyanocobalamin (B-12 PO), Take by mouth.  Current Outpatient Medications (Other):  Marland Kitchen  ALPRAZolam (XANAX) 0.25 MG tablet, TAKE ONE TABLET BY MOUTH EVERY NIGHT AT BEDTIME AS NEEDED FOR SLEEP .  Cholecalciferol (VITAMIN D-3 PO), Take 1 capsule by mouth daily. .  hyoscyamine (LEVSIN SL) 0.125 MG SL tablet, One tablet three times a day as needed for abdominal pain .  omeprazole (PRILOSEC) 40 MG capsule, Take 1  capsule Daily to Prevent Heartburn & Indigestion .  oxybutynin (DITROPAN-XL) 10 MG 24 hr tablet, Take 10 mg by mouth at bedtime. .  polyethylene glycol powder (GLYCOLAX/MIRALAX) powder, MIX 17 GRAMS AS DIRECTED TO TAKE BY MOUTH TWICE A DAY AS DIRECTED .  valACYclovir (VALTREX) 1000 MG tablet, Take 1 tablet (1,000 mg total) by mouth daily.  Problem list She has Anxiety state; Constipation; IBS (irritable bowel syndrome); Allergy; Migraines; Hyperlipidemia; Fibroids; Abnormal glucose; HSV-2 infection; Chronic interstitial cystitis; B12 deficiency; Vitamin D deficiency; Medication management; RUQ abdominal pain; Atypical chest pain; and Altered bowel habits on their problem list.   Review of Systems  Constitutional: Negative.  Negative for chills, diaphoresis, fatigue, fever and unexpected weight change.  HENT: Negative.   Eyes: Negative.   Respiratory: Negative.  Negative for shortness of breath.   Cardiovascular: Negative.  Negative for chest pain and leg swelling.  Gastrointestinal: Positive for abdominal pain and nausea. Negative for abdominal distention, anal bleeding, blood in stool, constipation, diarrhea, rectal pain and vomiting.  Genitourinary: Positive for dyspareunia and pelvic pain. Negative for decreased urine volume, difficulty urinating, dysuria, enuresis, flank pain, frequency, genital sores, hematuria, urgency, vaginal bleeding, vaginal discharge and vaginal pain.  Musculoskeletal: Positive for arthralgias. Negative for back pain, gait problem, joint swelling, myalgias, neck pain and neck stiffness.  Skin: Negative.  Negative for rash.  Neurological: Negative.   Hematological: Negative.   Psychiatric/Behavioral: Negative.        Objective:  Physical Exam General Appearance: Well nourished well developed, in no apparent distress.  Eyes: PERRLA, EOMs, conjunctiva no swelling or erythema ENT/Mouth: Ear canals normal without obstruction, swelling, erythema, or discharge.  TMs  normal bilaterally with no erythema, bulging, retraction, or loss of landmark.  Oropharynx moist and clear with no exudate, erythema, or swelling.   Neck: Supple, thyroid normal. No bruits.  No cervical adenopathy Respiratory: Respiratory effort normal, Breath sounds clear A&P without wheeze, rhonchi, rales.   Cardio: RRR without murmurs, rubs or gallops. Brisk peripheral pulses without edema.  Chest: symmetric, with normal excursions Abdomen: Soft, diffuse tenderness but worse suprapubic, no guarding, no rebound, no hernias, no masses, or no organomegaly.  Lymphatics: Non tender without lymphadenopathy.  Musculoskeletal: Full ROM all peripheral extremities,5/5 strength, and normal gait. FROM of right hip, some pain with abduction of right hip, + pain at greater trochanteric bursa.  Skin: Warm, dry without rashes, lesions, ecchymosis. Neuro: Awake and oriented X 3, Cranial nerves intact, reflexes equal bilaterally. Normal muscle tone, no cerebellar symptoms. Sensation intact.  Psych:  normal affect, Insight and Judgment appropriate.       Assessment & Plan:   Acute cystitis without hematuria -     Urinalysis, Routine w reflex microscopic -     Urine Culture -     ciprofloxacin (CIPRO) 500 MG tablet; Take 1 tablet (500 mg total) by mouth 2 (two) times daily for 7 days.- has taken cipro previously and tolerated well- unable to do bactrim or macrobid -     MICROSCOPIC MESSAGE  Benign AB exam, diffuse tenderness- recent MRI AB that was grossly normal, just had labs Will treat for possible UTI- may need to restart topical estrogen to decrease UTI's Some right hip pain- ? Need to see ortho to evaluate back and right hip- possible labrum tear with description, normal exam Patient does have a previous bladder sling

## 2019-10-27 LAB — URINALYSIS, ROUTINE W REFLEX MICROSCOPIC
Bacteria, UA: NONE SEEN /HPF
Bilirubin Urine: NEGATIVE
Glucose, UA: NEGATIVE
Hgb urine dipstick: NEGATIVE
Hyaline Cast: NONE SEEN /LPF
Ketones, ur: NEGATIVE
Nitrite: POSITIVE — AB
Protein, ur: NEGATIVE
RBC / HPF: NONE SEEN /HPF (ref 0–2)
Specific Gravity, Urine: 1.005 (ref 1.001–1.03)
WBC, UA: NONE SEEN /HPF (ref 0–5)
pH: 6 (ref 5.0–8.0)

## 2019-10-27 LAB — URINE CULTURE
MICRO NUMBER:: 11017120
SPECIMEN QUALITY:: ADEQUATE

## 2019-10-29 ENCOUNTER — Other Ambulatory Visit: Payer: Self-pay | Admitting: Physician Assistant

## 2019-10-29 MED ORDER — AMOXICILLIN-POT CLAVULANATE 875-125 MG PO TABS: 1.0000 | ORAL_TABLET | Freq: Two times a day (BID) | ORAL | 0 refills | Status: AC

## 2019-10-29 MED ORDER — FLUCONAZOLE 150 MG PO TABS: 150.0000 mg | ORAL_TABLET | Freq: Every day | ORAL | 3 refills | Status: DC

## 2019-10-29 NOTE — Addendum Note (Signed)
Addended by: Vicie Mutters R on: 10/29/2019 08:05 AM   Modules accepted: Orders

## 2019-11-22 ENCOUNTER — Other Ambulatory Visit: Payer: Self-pay | Admitting: Internal Medicine

## 2019-11-22 DIAGNOSIS — R002 Palpitations: Secondary | ICD-10-CM

## 2019-11-22 MED ORDER — ALPRAZOLAM 0.25 MG PO TABS: ORAL_TABLET | ORAL | 0 refills | Status: DC

## 2019-12-09 ENCOUNTER — Other Ambulatory Visit: Payer: Self-pay | Admitting: Obstetrics & Gynecology

## 2019-12-10 ENCOUNTER — Other Ambulatory Visit: Payer: Self-pay

## 2019-12-10 NOTE — Telephone Encounter (Signed)
Spoke with patient regarding any refill requests. She is Dr.Miller's patient and states [facility] had sent in refill requests she thought for Estradiol 0.5mg , Provera 2.5mg  and maybe Valtrex 1000mg .   Patient states only taking HRT 1/2 tablet of each qod and 1/2 tablet of Valtrex daily for suppression. She does not need refill at this time. She has spoken with Dr.Miller's new office and states they are to call her back in December to start scheduling appointments for her at that time. She will call our office if any problems prior to seeing [place]. She thanked me for my time.  Routed to Dr.Silva

## 2019-12-10 NOTE — Telephone Encounter (Signed)
Patient is calling in regards to a refill of Estradiol being denied. Patient is due for AEX an states she wishes to follow Dr. Sabra Heck.

## 2019-12-11 NOTE — Telephone Encounter (Signed)
Encounter reviewed and closed.  

## 2019-12-23 ENCOUNTER — Other Ambulatory Visit: Payer: Self-pay | Admitting: Internal Medicine

## 2019-12-23 DIAGNOSIS — R002 Palpitations: Secondary | ICD-10-CM

## 2019-12-24 ENCOUNTER — Ambulatory Visit: Payer: 59 | Admitting: Adult Health Nurse Practitioner

## 2019-12-24 ENCOUNTER — Other Ambulatory Visit: Payer: Self-pay

## 2019-12-24 ENCOUNTER — Encounter: Payer: Self-pay | Admitting: Adult Health Nurse Practitioner

## 2019-12-24 VITALS — BP 118/74 | Temp 97.5°F | Ht 63.0 in | Wt 110.2 lb

## 2019-12-24 DIAGNOSIS — J309 Allergic rhinitis, unspecified: Secondary | ICD-10-CM

## 2019-12-24 DIAGNOSIS — R300 Dysuria: Secondary | ICD-10-CM | POA: Diagnosis not present

## 2019-12-24 DIAGNOSIS — Z1152 Encounter for screening for COVID-19: Secondary | ICD-10-CM | POA: Diagnosis not present

## 2019-12-24 DIAGNOSIS — R059 Cough, unspecified: Secondary | ICD-10-CM

## 2019-12-24 DIAGNOSIS — J40 Bronchitis, not specified as acute or chronic: Secondary | ICD-10-CM

## 2019-12-24 DIAGNOSIS — R0989 Other specified symptoms and signs involving the circulatory and respiratory systems: Secondary | ICD-10-CM | POA: Diagnosis not present

## 2019-12-24 LAB — POC COVID19 BINAXNOW: SARS Coronavirus 2 Ag: NEGATIVE

## 2019-12-24 MED ORDER — AZITHROMYCIN 250 MG PO TABS: ORAL_TABLET | ORAL | 1 refills | Status: AC

## 2019-12-24 NOTE — Progress Notes (Signed)
Assessment and Plan:  Patricia Ryan was seen today for acute visit and cough.  Diagnoses and all orders for this visit:  Encounter for screening for COVID-19 -     POC COVID-19 BinaxNow - Negative  Dysuria -     Urinalysis w microscopic + reflex cultur Finished course of antibiotics two weeks ago.  Cough Chest congestion Mucinex DM Q12 hours while symptomatic Increase water intake Continue Tessalon pearls PRN On-going for 7 days if no improvement in 3 days pick up Azithromyacin for bacteria involvement.  Allergic rhinitis, unspecified seasonality, unspecified trigger Contnue flonase, if blood nose, STOP Saline PRN Zrytec or Xyzal at night     Further disposition pending results of labs. Discussed med's effects and SE's.   Over 30 minutes of face to face interview, exam, counseling, chart review, and critical decision making was performed.   Future Appointments  Date Time Provider [place]  02/12/2020  3:00 PM Garnet Sierras, NP [facility] None    ------------------------------------------------------------------------------------------------------------------   HPI 64 y.o.female presents for evaluation of cough and congestion.  She had a antigen test at onset of symptoms and she was negative.  She has testing by our office today and this was negative.  She reports her cough is semi productive.  She reports in the evenings she gets hoarse.  She reports the phlem is yellow but also has a dry cough with some chest tightness. She has tried zyrtec for three days and did not notice a difference.  She reports she does use flonase daily.  She has lost of drainage and cough is worse at night.  She has taken tessalon pearls that has helped some.      She is schedule for Booster shot in two days.  Past Medical History:  Diagnosis Date   Allergy    Anal fissure    ANAL FISSURE, HX OF 09/24/2008   Qualifier: Diagnosis of  By: Tamala Julian CMA, Claiborne Billings     Anxiety    Arthritis     Fibroids    Uterine   GERD (gastroesophageal reflux disease)    HSV-2 (herpes simplex virus 2) infection    Hyperlipidemia    IBS (irritable bowel syndrome)    Interstitial cystitis    Kidney stone 2019   --Dr.Evans   Nevus    vulva nevus   Prediabetes      Allergies  Allergen Reactions   Sulfa Antibiotics Hives, Shortness Of Breath and Rash   Macrobid [Nitrofurantoin Macrocrystal] Nausea Only    Current Outpatient Medications on File Prior to Visit  Medication Sig   ALPRAZolam (XANAX) 0.25 MG tablet Take      1/2 - 1 tablet       at Bedtime     ONLY       if needed for Insomnia    &   try limit to 5 days /week to avoid Addiction & Dementia - Should last 6 weeks   Cholecalciferol (VITAMIN D-3 PO) Take 1 capsule by mouth daily.   Cyanocobalamin (B-12 PO) Take by mouth.   estradiol (ESTRACE) 0.5 MG tablet TAKE 1 TABLET BY MOUTH EVERY DAY   hyoscyamine (LEVSIN SL) 0.125 MG SL tablet One tablet three times a day as needed for abdominal pain   medroxyPROGESTERone (PROVERA) 2.5 MG tablet TAKE 1 TABLET BY MOUTH EVERY DAY   omeprazole (PRILOSEC) 40 MG capsule Take 1 capsule Daily to Prevent Heartburn & Indigestion   oxybutynin (DITROPAN-XL) 10 MG 24 hr tablet Take 10 mg by mouth  at bedtime.   polyethylene glycol powder (GLYCOLAX/MIRALAX) powder MIX 17 GRAMS AS DIRECTED TO TAKE BY MOUTH TWICE A DAY AS DIRECTED   rosuvastatin (CRESTOR) 20 MG tablet Take 1 tablet Daily for Cholesterol   valACYclovir (VALTREX) 1000 MG tablet Take 1 tablet (1,000 mg total) by mouth daily.   No current facility-administered medications on file prior to visit.    ROS: all negative except above.   Physical Exam:  BP 118/74    Temp (!) 97.5 F (36.4 C)    Ht 5\' 3"  (1.6 m)    Wt 110 lb 3.2 oz (50 kg)    LMP 01/20/2011    BMI 19.52 kg/m   General Appearance: Well nourished, in no apparent distress. Eyes: PERRLA, EOMs, conjunctiva no swelling or erythema Sinuses: No  Frontal/maxillary tenderness ENT/Mouth: Ext aud canals clear, TMs without erythema, bulging. No erythema, swelling, or exudate on post pharynx.  Tonsils not swollen or erythematous. Hearing normal.  Neck: Supple, thyroid normal.  Respiratory: Respiratory effort normal, BS equal bilaterally without rales, rhonchi, wheezing or stridor.  Cardio: RRR with no MRGs. Brisk peripheral pulses without edema.  Abdomen: Soft, + BS.  Non tender, no guarding, rebound, hernias, masses. Lymphatics: Non tender without lymphadenopathy.  Musculoskeletal: Full ROM, 5/5 strength, normal gait.  Skin: Warm, dry without rashes, lesions, ecchymosis.  Neuro: Cranial nerves intact. Normal muscle tone, no cerebellar symptoms. Sensation intact.  Psych: Awake and oriented X 3, normal affect, Insight and Judgment appropriate.     Garnet Sierras, NP 11:50 AM [facility]

## 2019-12-24 NOTE — Patient Instructions (Addendum)
  Try taking Xyzal , Zyrtec, at night  And/or Allegra daily can take during the day.  This will help to dry up fluid.  Take Mucinex DM every 12 hours.   If this is not improved after three days then pick up Azithromyacin and take as directed.

## 2019-12-26 ENCOUNTER — Other Ambulatory Visit: Payer: Self-pay | Admitting: Adult Health Nurse Practitioner

## 2019-12-26 DIAGNOSIS — B373 Candidiasis of vulva and vagina: Secondary | ICD-10-CM

## 2019-12-26 DIAGNOSIS — N390 Urinary tract infection, site not specified: Secondary | ICD-10-CM

## 2019-12-26 DIAGNOSIS — B3731 Acute candidiasis of vulva and vagina: Secondary | ICD-10-CM

## 2019-12-26 MED ORDER — FLUCONAZOLE 150 MG PO TABS: ORAL_TABLET | ORAL | 0 refills | Status: DC

## 2019-12-26 MED ORDER — AMOXICILLIN-POT CLAVULANATE 875-125 MG PO TABS: 1.0000 | ORAL_TABLET | Freq: Two times a day (BID) | ORAL | 0 refills | Status: DC

## 2019-12-28 ENCOUNTER — Telehealth: Payer: Self-pay | Admitting: *Deleted

## 2019-12-28 NOTE — Telephone Encounter (Signed)
Left patient a message to call and schedule annual and pap appointment with Dr. Sabra Heck on 01/02/2020 or later.

## 2019-12-29 LAB — URINALYSIS W MICROSCOPIC + REFLEX CULTURE
Bilirubin Urine: NEGATIVE
Glucose, UA: NEGATIVE
Hgb urine dipstick: NEGATIVE
Hyaline Cast: NONE SEEN /LPF
Ketones, ur: NEGATIVE
Leukocyte Esterase: NEGATIVE
Nitrites, Initial: NEGATIVE
Protein, ur: NEGATIVE
RBC / HPF: NONE SEEN /HPF (ref 0–2)
Specific Gravity, Urine: 1.008 (ref 1.001–1.03)
Squamous Epithelial / HPF: NONE SEEN /HPF (ref <=5)
WBC, UA: NONE SEEN /HPF (ref 0–5)
pH: 7 (ref 5.0–8.0)

## 2019-12-29 LAB — TEST AUTHORIZATION

## 2019-12-29 LAB — URINE CULTURE
MICRO NUMBER:: 11268649
SPECIMEN QUALITY:: ADEQUATE

## 2019-12-29 LAB — NO CULTURE INDICATED

## 2020-01-01 ENCOUNTER — Ambulatory Visit: Payer: Self-pay | Admitting: Obstetrics & Gynecology

## 2020-01-02 ENCOUNTER — Encounter: Payer: Self-pay | Admitting: Obstetrics & Gynecology

## 2020-01-02 ENCOUNTER — Other Ambulatory Visit (HOSPITAL_COMMUNITY)
Admission: RE | Admit: 2020-01-02 | Discharge: 2020-01-02 | Disposition: A | Discharge status: Home / Self Care | Payer: 59 | Source: Ambulatory Visit | Attending: Obstetrics & Gynecology | Admitting: Obstetrics & Gynecology

## 2020-01-02 ENCOUNTER — Ambulatory Visit (INDEPENDENT_AMBULATORY_CARE_PROVIDER_SITE_OTHER): Payer: 59 | Admitting: Obstetrics & Gynecology

## 2020-01-02 ENCOUNTER — Other Ambulatory Visit: Payer: Self-pay

## 2020-01-02 VITALS — BP 135/66 | HR 74 | Ht 63.0 in | Wt 110.0 lb

## 2020-01-02 DIAGNOSIS — B977 Papillomavirus as the cause of diseases classified elsewhere: Secondary | ICD-10-CM

## 2020-01-02 DIAGNOSIS — Z01419 Encounter for gynecological examination (general) (routine) without abnormal findings: Secondary | ICD-10-CM | POA: Diagnosis not present

## 2020-01-02 DIAGNOSIS — B009 Herpesviral infection, unspecified: Secondary | ICD-10-CM

## 2020-01-02 DIAGNOSIS — N3010 Interstitial cystitis (chronic) without hematuria: Secondary | ICD-10-CM

## 2020-01-02 DIAGNOSIS — Z124 Encounter for screening for malignant neoplasm of cervix: Secondary | ICD-10-CM | POA: Diagnosis present

## 2020-01-02 DIAGNOSIS — N952 Postmenopausal atrophic vaginitis: Secondary | ICD-10-CM

## 2020-01-02 DIAGNOSIS — Z9229 Personal history of other drug therapy: Secondary | ICD-10-CM

## 2020-01-02 DIAGNOSIS — N390 Urinary tract infection, site not specified: Secondary | ICD-10-CM

## 2020-01-02 MED ORDER — VALACYCLOVIR HCL 500 MG PO TABS
500.0000 mg | ORAL_TABLET | Freq: Every day | ORAL | 4 refills | Status: DC
Start: 2020-01-02 — End: 2021-01-27

## 2020-01-02 MED ORDER — ESTRADIOL 0.5 MG PO TABS
0.5000 mg | ORAL_TABLET | Freq: Every day | ORAL | 4 refills | Status: DC
Start: 2020-01-02 — End: 2021-01-29

## 2020-01-02 MED ORDER — MEDROXYPROGESTERONE ACETATE 2.5 MG PO TABS: 2.5000 mg | ORAL_TABLET | Freq: Every day | ORAL | 4 refills | Status: DC

## 2020-01-02 NOTE — Progress Notes (Signed)
64 y.o. G2P2 Married White or Caucasian female here for annual exam.  Denies vaginal bleeding.  Has been experiencing recurrent UTIs now for several months.  I see 4 positive urine cultures in the last four months.  She did see NP with Dr. Amalia Hailey office, [facility] urology.  She would like her urine tested today.    Had MRI due to abdominal pain.  Hemangioma was diagnosed.  Followed by Dr. Loletha Carrow.  Denies vaginal bleeding.  Patient's last menstrual period was 01/20/2011.          Sexually active: Yes.    The current method of family planning is post menopausal status.    Exercising: not right now Smoker:  former  Health Maintenance: Pap:  12/25/2019 Neg with +HR HPV  12/16/17 Neg:Pos HR HPV but 16/18/45 Neg             05/25/16 Normal + HPV but the 16/18/45 testing was negative             04/24/15 Neg: (+) HR HPV             02/05/13 Neg History of abnormal Pap:  yes MMG:  09/02/2019 Colonoscopy:  2014 hyperplastic polyp, repeat 10 years BMD:   09/18/2018 osteopenia TDaP:  2016 Pneumonia vaccine(s):  Due after 64 yo Shingrix:   Zoster 2009 Hep C testing: 03/2015 Screening Labs: Dr. Melford Aase   reports that she quit smoking about 8 years ago. Her smoking use included cigarettes. She has never used smokeless tobacco. She reports current alcohol use of about 5.0 standard drinks of alcohol per week. She reports that she does not use drugs.  Past Medical History:  Diagnosis Date  . Allergy   . Anal fissure   . ANAL FISSURE, HX OF 09/24/2008   Qualifier: Diagnosis of  By: Tamala Julian CMA, Claiborne Billings    . Anxiety   . Arthritis   . Fibroids    Uterine  . GERD (gastroesophageal reflux disease)   . HSV-2 (herpes simplex virus 2) infection   . Hyperlipidemia   . IBS (irritable bowel syndrome)   . Interstitial cystitis   . Kidney stone 2019   --Dr.Evans  . Nevus    vulva nevus  . Prediabetes     Past Surgical History:  Procedure Laterality Date  . BLADDER REPAIR    . BUNIONECTOMY    . CHOLECYSTECTOMY     . COLONOSCOPY    . LAPAROSCOPY  1985/ 1990   due to infertility  . LITHOTRIPSY    . TONSILLECTOMY    . tubal reconstruction    . tubalplasty  1985/ 1990   . UPPER GASTROINTESTINAL ENDOSCOPY    . VAGINAL DELIVERY     x2    Current Outpatient Medications  Medication Sig Dispense Refill  . ALPRAZolam (XANAX) 0.25 MG tablet Take      1/2 - 1 tablet       at Bedtime     ONLY       if needed for Insomnia    &   try limit to 5 days /week to avoid Addiction & Dementia - Should last 6 weeks 30 tablet 0  . amoxicillin-clavulanate (AUGMENTIN) 875-125 MG tablet Take 1 tablet by mouth 2 (two) times daily. 10 days 20 tablet 0  . Cholecalciferol (VITAMIN D-3 PO) Take 1 capsule by mouth daily.    . Cyanocobalamin (B-12 PO) Take by mouth.    . estradiol (ESTRACE) 0.5 MG tablet TAKE 1 TABLET BY MOUTH EVERY DAY  90 tablet 3  . fluconazole (DIFLUCAN) 150 MG tablet Take one tablet at onset of symptoms and second tablet three days later for yeast. 2 tablet 0  . hyoscyamine (LEVSIN SL) 0.125 MG SL tablet One tablet three times a day as needed for abdominal pain 45 tablet 0  . medroxyPROGESTERone (PROVERA) 2.5 MG tablet TAKE 1 TABLET BY MOUTH EVERY DAY 30 tablet 2  . omeprazole (PRILOSEC) 40 MG capsule Take 1 capsule Daily to Prevent Heartburn & Indigestion 90 capsule 1  . oxybutynin (DITROPAN-XL) 10 MG 24 hr tablet Take 10 mg by mouth at bedtime.    . polyethylene glycol powder (GLYCOLAX/MIRALAX) powder MIX 17 GRAMS AS DIRECTED TO TAKE BY MOUTH TWICE A DAY AS DIRECTED 255 g 3  . rosuvastatin (CRESTOR) 20 MG tablet Take 1 tablet Daily for Cholesterol 90 tablet 1  . valACYclovir (VALTREX) 1000 MG tablet Take 1 tablet (1,000 mg total) by mouth daily. 30 tablet 13   No current facility-administered medications for this visit.    Family History  Problem Relation Age of Onset  . Celiac disease Mother   . Heart disease Father   . Irritable bowel syndrome Sister   . Thyroid disease Sister   . Pancreatic  cancer Maternal Grandmother   . Lung cancer Maternal Grandmother   . Diabetes Paternal Grandmother   . Breast cancer Paternal Grandmother   . Kidney disease Other        tumor  . Cancer Sister 64       Melanoma  . Heart disease Sister   . Colon cancer Neg Hx   . Esophageal cancer Neg Hx   . Rectal cancer Neg Hx   . Stomach cancer Neg Hx     Review of Systems  All other systems reviewed and are negative.   Exam:   BP 135/66   Pulse 74   Ht 5\' 3"  (1.6 m)   Wt 110 lb (49.9 kg)   LMP 01/20/2011   BMI 19.49 kg/m   Height: 5\' 3"  (160 cm)  General appearance: alert, cooperative and appears stated age Head: Normocephalic, without obvious abnormality, atraumatic Neck: no adenopathy, supple, symmetrical, trachea midline and thyroid normal to inspection and palpation Lungs: clear to auscultation bilaterally Breasts: normal appearance, no masses or tenderness Heart: regular rate and rhythm Abdomen: soft, non-tender; bowel sounds normal; no masses,  no organomegaly Extremities: extremities normal, atraumatic, no cyanosis or edema Skin: Skin color, texture, turgor normal. No rashes or lesions Lymph nodes: Cervical, supraclavicular, and axillary nodes normal. No abnormal inguinal nodes palpated Neurologic: Grossly normal   Pelvic: External genitalia:  no lesions              Urethra:  normal appearing urethra with no masses, tenderness or lesions              Bartholins and Skenes: normal                 Vagina: normal appearing vagina with normal color and discharge, no lesions              Cervix: no lesions              Pap taken: Yes.   Bimanual Exam:  Uterus:  normal size, contour, position, consistency, mobility, non-tender              Adnexa: normal adnexa and no mass, fullness, tenderness               Rectovaginal:  Confirms               Anus:  normal sphincter tone, no lesions  Chaperone, was present for exam.  Assessment/Plan: 1. Well woman exam with routine  gynecological exam - Pap and HR HPV Obtained today - MMG normal 08/2019 - Colonoscopy 2014 with hyperplastis polyp - vaccines reviewed - lab work done with Dr. Idell Pickles office  2. Recurrent UTI - Urine Culture obtained today  3. HSV-2 infection - rx for Valtrex 500mg  daily to pharmacy.  #90/4RF  4. Interstitial cystitis - Followed by [facility] urology  5. HRT use - RF for estrace 0.5mg , 1/2 tab every other day and Provera 2.5mg  1/2 tab every other day.  Pt working on weaning off of these.  6. H/O +HR HPV - Cytology - PAP( [place])

## 2020-01-03 LAB — URINE CULTURE
MICRO NUMBER:: 11294006
Result:: NO GROWTH
SPECIMEN QUALITY:: ADEQUATE

## 2020-01-04 LAB — CYTOLOGY - PAP
Comment: NEGATIVE
Diagnosis: NEGATIVE
High risk HPV: NEGATIVE

## 2020-01-07 ENCOUNTER — Other Ambulatory Visit: Payer: Self-pay | Admitting: Obstetrics & Gynecology

## 2020-01-07 MED ORDER — ESTRADIOL 0.1 MG/GM VA CREA: TOPICAL_CREAM | VAGINAL | 3 refills | Status: DC

## 2020-01-08 ENCOUNTER — Other Ambulatory Visit: Payer: Self-pay | Admitting: Obstetrics & Gynecology

## 2020-01-21 ENCOUNTER — Other Ambulatory Visit: Payer: Self-pay | Admitting: Internal Medicine

## 2020-01-21 DIAGNOSIS — R002 Palpitations: Secondary | ICD-10-CM

## 2020-01-21 MED ORDER — ALPRAZOLAM 0.25 MG PO TABS: ORAL_TABLET | ORAL | 0 refills | Status: DC

## 2020-02-12 ENCOUNTER — Encounter: Payer: Self-pay | Admitting: Adult Health Nurse Practitioner

## 2020-02-12 ENCOUNTER — Other Ambulatory Visit: Payer: Self-pay

## 2020-02-12 ENCOUNTER — Ambulatory Visit (INDEPENDENT_AMBULATORY_CARE_PROVIDER_SITE_OTHER): Payer: 59 | Admitting: Adult Health Nurse Practitioner

## 2020-02-12 VITALS — BP 94/80 | HR 63 | Temp 97.3°F | Ht 63.0 in | Wt 111.2 lb

## 2020-02-12 DIAGNOSIS — Z1389 Encounter for screening for other disorder: Secondary | ICD-10-CM

## 2020-02-12 DIAGNOSIS — R1013 Epigastric pain: Secondary | ICD-10-CM

## 2020-02-12 DIAGNOSIS — J309 Allergic rhinitis, unspecified: Secondary | ICD-10-CM

## 2020-02-12 DIAGNOSIS — R002 Palpitations: Secondary | ICD-10-CM

## 2020-02-12 DIAGNOSIS — M8580 Other specified disorders of bone density and structure, unspecified site: Secondary | ICD-10-CM

## 2020-02-12 DIAGNOSIS — Z1329 Encounter for screening for other suspected endocrine disorder: Secondary | ICD-10-CM

## 2020-02-12 DIAGNOSIS — B009 Herpesviral infection, unspecified: Secondary | ICD-10-CM

## 2020-02-12 DIAGNOSIS — Z136 Encounter for screening for cardiovascular disorders: Secondary | ICD-10-CM

## 2020-02-12 DIAGNOSIS — E559 Vitamin D deficiency, unspecified: Secondary | ICD-10-CM

## 2020-02-12 DIAGNOSIS — Z79899 Other long term (current) drug therapy: Secondary | ICD-10-CM

## 2020-02-12 DIAGNOSIS — Z0000 Encounter for general adult medical examination without abnormal findings: Secondary | ICD-10-CM | POA: Diagnosis not present

## 2020-02-12 DIAGNOSIS — Z1159 Encounter for screening for other viral diseases: Secondary | ICD-10-CM

## 2020-02-12 DIAGNOSIS — K581 Irritable bowel syndrome with constipation: Secondary | ICD-10-CM

## 2020-02-12 DIAGNOSIS — E538 Deficiency of other specified B group vitamins: Secondary | ICD-10-CM

## 2020-02-12 DIAGNOSIS — F411 Generalized anxiety disorder: Secondary | ICD-10-CM

## 2020-02-12 DIAGNOSIS — R7309 Other abnormal glucose: Secondary | ICD-10-CM

## 2020-02-12 DIAGNOSIS — N3010 Interstitial cystitis (chronic) without hematuria: Secondary | ICD-10-CM

## 2020-02-12 DIAGNOSIS — E782 Mixed hyperlipidemia: Secondary | ICD-10-CM

## 2020-02-12 DIAGNOSIS — Z0001 Encounter for general adult medical examination with abnormal findings: Secondary | ICD-10-CM

## 2020-02-12 DIAGNOSIS — N952 Postmenopausal atrophic vaginitis: Secondary | ICD-10-CM

## 2020-02-12 MED ORDER — ALPRAZOLAM 0.25 MG PO TABS: ORAL_TABLET | ORAL | 0 refills | Status: DC

## 2020-02-12 NOTE — Progress Notes (Signed)
COMPLETE PHYSICAL  Assessment and Plan:  Epigastric pain  DDX GERD, ulcer , pancreatitis- nonexertional at night, normal EKG  check labs, bland diet, small portions, increase H20, PPI DAILY Stop NSAIDS, ETOH if not better will refer to GI Patient advised to go to the ER if the symptoms increase or worsen.    Mixed hyperlipidemia Continue medications:rosuvastatin 10mg  Discussed dietary and exercise modifications Low fat diet  -     Lipid panel -     EKG 12-Lead   Irritable bowel syndrome with constipation Increase fiber, diet discussed Discussed metamucil Uses miralax prn  Anxiety state -continue medications: Alprazolam 0.25mg  half to one prn. , stress management techniques discussed, increase water, good sleep hygiene discussed, increase exercise, and increase veggies.   Chronic interstitial cystitis Follows with Urology Continue to monitor  Abnormal glucose Discussed disease progression and risks Discussed diet/exercise, weight management and risk modification -     Defer A1C today, weight stable, normal A1C last visit  HSV-2 infection Continue medications  Allergic state, sequela Continue OTC meds  Vitamin D deficiency .Continue supplementation to maintain goal of 70-100 Taking Vitamin D 2,000 IU daily Recent increasel -     VITAMIN D 25 Hydroxy (Vit-D Deficiency, Fractures)  B12 def  Taking 1,000mg  every other day supplementation -B12  Vasmotor symptom  Estroil 5mg  half tablet, progesterone 2.5mg  half tablet both twice a week.  Working to decrease this, related to duration of therapy   Medication management -     CBC with Differential/Platelet -     BASIC METABOLIC PANEL WITH GFR -     Hepatic function panel -     Magnesium  Screening for thyroid disorder -     TSH  Screening ischemic heart disease -EKG  Screening blood protein in Urine -UA  BMI  Add on protein powder, start to work out again.   Vaginal atrophy Follow up GYN  Quit  smoking 9 years ago.  Discussed med's effects and SE's. Screening labs and tests as requested with regular follow-up as recommended. Future Appointments  Date Time Provider [place]  02/12/2020  3:00 PM Garnet Sierras, NP [facility] None    HPI  65 y.o. female  presents for a complete physical.  Frequent UTI's, she has interstitial cystitis.  Frequency remains present over urgency.  She is taking oxybutinin which is helping with frequency and spasms.  She follows with Dr Evens Q59month.  She also had kidney stones. .  Personal history of COVID.  She has had severe epigastric pressure that goes over her RUQ, will happen mainly at night, non exertional, every couple of months. Has had her gallbladder removed in the past, states it feels like that. She is on omeprazole every other day. EGD 2014. She has been on diclofenac 50 mg BID. She is not on tumeric over the counter. She does wine/mixed drink 3 days a week. She is following with Dr. Loletha Carrow.   She is on oxybutinin XL but needs the 10 mg. Can not afford the myrebrtiq even with the card. Crestor is expensive at [facility], will send into [facility] and use good RX.  She is completing oral prednisone and PT to help with her back. Dr Hal Morales.  Considering injection is no relief.      Her blood pressure has been controlled at home, today their BP is  .  She does workout. She denies chest pain, shortness of breath, dizziness.   BMI is There is no height or weight on  file to calculate BMI., she is working on diet and exercise. Wt Readings from Last 3 Encounters:  01/02/20 110 lb (49.9 kg)  12/24/19 110 lb 3.2 oz (50 kg)  10/25/19 107 lb (48.5 kg)    She is on cholesterol medication. She is doing 20 mg every other day. Her cholesterol is at goal. The cholesterol last visit was:  Lab Results  Component Value Date   CHOL 183 09/11/2019   HDL 66 09/11/2019   LDLCALC 100 (H) 09/11/2019   TRIG 77 09/11/2019   CHOLHDL 2.8 09/11/2019    She has been working on diet and exercise for prediabetes, she is on bASA, she is not on ACE/ARB and denies foot ulcerations, hyperglycemia, hypoglycemia , increased appetite, nausea, paresthesia of the feet, polydipsia, polyuria, visual disturbances, vomiting and weight loss. Last A1C in the office was:  Lab Results  Component Value Date   HGBA1C 5.6 08/29/2018   Patient is on Vitamin D supplement.   Lab Results  Component Value Date   VD25OH 39 09/11/2019      She is very stressed, keeps her grand babies and another baby 3 days a week, 1 under 2 one is 2 and one is 63 years old. She mainly takes xanax 0.25 but had to get the 1 mg due to shortage and she can only take this at night 0.5mg  at night, none during the day. Interested in trying something else for sleep.   She has had hematuria and recurrent UTI, but had kidney stone s/p lithotripsy and is on the premarin cream, has more sensation of kidney stone and has follow up.    Current Medications:   Current Outpatient Medications (Endocrine & Metabolic):  .  estradiol (ESTRACE) 0.5 MG tablet, Take 1 tablet (0.5 mg total) by mouth daily. .  medroxyPROGESTERone (PROVERA) 2.5 MG tablet, Take 1 tablet (2.5 mg total) by mouth daily.  Current Outpatient Medications (Cardiovascular):  .  rosuvastatin (CRESTOR) 20 MG tablet, Take 1 tablet Daily for Cholesterol    Current Outpatient Medications (Hematological):  Marland Kitchen  Cyanocobalamin (B-12 PO), Take by mouth.  Current Outpatient Medications (Other):  Marland Kitchen  ALPRAZolam (XANAX) 0.25 MG tablet, Take      1/2 - 1 tablet       at Bedtime     ONLY       if needed for Insomnia    &   try limit to 5 days /week to avoid Addiction & Dementia - Should last 4 weeks .  Cholecalciferol (VITAMIN D-3 PO), Take 1 capsule by mouth daily. Marland Kitchen  estradiol (ESTRACE) 0.1 MG/GM vaginal cream, 1 gram vaginally twice weekly .  fluconazole (DIFLUCAN) 150 MG tablet, Take one tablet at onset of symptoms and second tablet  three days later for yeast. .  hyoscyamine (LEVSIN SL) 0.125 MG SL tablet, One tablet three times a day as needed for abdominal pain .  omeprazole (PRILOSEC) 40 MG capsule, Take 1 capsule Daily to Prevent Heartburn & Indigestion .  oxybutynin (DITROPAN-XL) 10 MG 24 hr tablet, Take 10 mg by mouth at bedtime. .  polyethylene glycol powder (GLYCOLAX/MIRALAX) powder, MIX 17 GRAMS AS DIRECTED TO TAKE BY MOUTH TWICE A DAY AS DIRECTED .  valACYclovir (VALTREX) 500 MG tablet, Take 1 tablet (500 mg total) by mouth daily.  Health Maintenance:   Immunization History  Administered Date(s) Administered  . DTaP 01/26/2004  . Influenza Inj Mdck Quad Pf 10/29/2016, 10/17/2017, 10/20/2018  . Influenza,inj,Quad PF,6+ Mos 10/20/2018, 10/22/2019  . Influenza-Unspecified  10/22/2015, 10/29/2016, 10/17/2017  . Tdap 01/28/2014  . Zoster 01/26/2007   Tetanus: 2016 Flu vaccine: 2021  Zostavax: 2009 Shingrix: Discussed with patient, at age 83.  Pap: 2017 Dr. Sabra Heck MGM: 2020 Dr. Sabra Heck at [place] 08/2016 Colonoscopy: 2014 Last Dental Exam:  Dr. Ennis Forts Last Eye Exam: Dr. Earl Gala Hep C: Negative  Patient Care Team: Unk Pinto, MD as PCP - General (Internal Medicine) Lafayette Dragon, MD (Inactive) as Consulting Physician (Gastroenterology)    Medical History:  Past Medical History:  Diagnosis Date  . Allergy   . ANAL FISSURE, HX OF 09/24/2008   Qualifier: Diagnosis of  By: Tamala Julian CMA, Claiborne Billings    . Anxiety   . Arthritis   . Fibroids    Uterine  . GERD (gastroesophageal reflux disease)   . HSV-2 (herpes simplex virus 2) infection   . Hyperlipidemia   . IBS (irritable bowel syndrome)   . Interstitial cystitis   . Kidney stone 2019   --Dr.Evans  . Nevus    vulva nevus  . Prediabetes    Allergies Allergies  Allergen Reactions  . Sulfa Antibiotics Hives, Shortness Of Breath and Rash  . Macrobid [Nitrofurantoin Macrocrystal] Nausea Only    SURGICAL HISTORY She  has a past surgical  history that includes Cholecystectomy; tubal reconstruction; Bladder repair; Tonsillectomy; Vaginal delivery; laparoscopy (1985/ 1990); tubalplasty (1985/ 1990 ); Bunionectomy; Lithotripsy; Colonoscopy; and Upper gastrointestinal endoscopy. FAMILY HISTORY Her family history includes Breast cancer in her paternal grandmother; Cancer (age of onset: 7) in her sister; Celiac disease in her mother; Diabetes in her paternal grandmother; Heart disease in her father and sister; Irritable bowel syndrome in her sister; Kidney disease in an other family member; Lung cancer in her maternal grandmother; Pancreatic cancer in her maternal grandmother; Thyroid disease in her sister. SOCIAL HISTORY She  reports that she quit smoking about 8 years ago. Her smoking use included cigarettes. She has never used smokeless tobacco. She reports current alcohol use of about 5.0 standard drinks of alcohol per week. She reports that she does not use drugs.  Review of Systems: Review of Systems  Constitutional: Negative for chills, diaphoresis, fever, malaise/fatigue and weight loss.  HENT: Negative for congestion, ear discharge, ear pain, hearing loss, nosebleeds, sinus pain, sore throat and tinnitus.   Eyes: Negative for blurred vision, double vision, photophobia, pain, discharge and redness.  Respiratory: Negative for cough, hemoptysis, sputum production, shortness of breath, wheezing and stridor.   Cardiovascular: Negative for chest pain, palpitations, orthopnea, claudication, leg swelling and PND.  Gastrointestinal: Negative for abdominal pain, blood in stool, constipation, diarrhea, heartburn, melena, nausea and vomiting.  Genitourinary: Negative for dysuria, flank pain, frequency, hematuria and urgency.  Musculoskeletal: Negative for back pain, falls, joint pain, myalgias and neck pain.  Skin: Negative for itching and rash.  Neurological: Negative for dizziness, tingling, tremors, sensory change, speech change, focal  weakness, seizures, loss of consciousness, weakness and headaches.  Endo/Heme/Allergies: Negative for environmental allergies and polydipsia. Does not bruise/bleed easily.  Psychiatric/Behavioral: Negative for depression, hallucinations, memory loss, substance abuse and suicidal ideas. The patient is not nervous/anxious and does not have insomnia.     Physical Exam: Estimated body mass index is 19.49 kg/m as calculated from the following:   Height as of 01/02/20: 5\' 3"  (1.6 m).   Weight as of 01/02/20: 110 lb (49.9 kg). LMP 01/20/2011   General Appearance: Well nourished well developed, in no apparent distress.  Eyes: PERRLA, EOMs, conjunctiva no swelling or erythema ENT/Mouth: Ear canals  normal without obstruction, swelling, erythema, or discharge.  TMs normal bilaterally with no erythema, bulging, retraction, or loss of landmark.  Oropharynx moist and clear with no exudate, erythema, or swelling.   Neck: Supple, thyroid normal. No bruits.  No cervical adenopathy Respiratory: Respiratory effort normal, Breath sounds clear A&P without wheeze, rhonchi, rales.   Cardio: RRR without murmurs, rubs or gallops. Brisk peripheral pulses without edema.  Chest: symmetric, with normal excursions Breasts: Deferred to GYN Abdomen: Soft, nontender, no guarding, rebound, hernias, masses, or organomegaly.  Lymphatics: Non tender without lymphadenopathy.  Musculoskeletal: Full ROM all peripheral extremities,5/5 strength, and normal gait.  Skin: Warm, dry without rashes, lesions, ecchymosis. Neuro: Awake and oriented X 3, Cranial nerves intact, reflexes equal bilaterally. Normal muscle tone, no cerebellar symptoms. Sensation intact.  Psych:  normal affect, Insight and Judgment appropriate.   EKG: WNL, sinus brady, no ST changes.   Over 40 minutes of exam, counseling, chart review and critical decision making was performed  Aren Cherne 3:54 PM [facility]

## 2020-02-12 NOTE — Patient Instructions (Signed)
   You are up to date with your routine health screening.   GENERAL HEALTH GOALS  Know what a healthy weight is for you (roughly BMI <25) and aim to maintain this  Aim for 7+ servings of fruits and vegetables daily  70-80+ fluid ounces of water or unsweet tea for healthy kidneys  Limit to max 1 drink of alcohol per day; avoid smoking/tobacco  Limit animal fats in diet for cholesterol and heart health - choose grass fed whenever available  Avoid highly processed foods, and foods high in saturated/trans fats  Aim for low stress - take time to unwind and care for your mental health  Aim for 150 min of moderate intensity exercise weekly for heart health, and weights twice weekly for bone health  Aim for 7-9 hours of sleep daily

## 2020-02-13 ENCOUNTER — Other Ambulatory Visit: Payer: Self-pay | Admitting: Adult Health Nurse Practitioner

## 2020-02-13 DIAGNOSIS — R7989 Other specified abnormal findings of blood chemistry: Secondary | ICD-10-CM

## 2020-02-13 LAB — COMPLETE METABOLIC PANEL WITH GFR
AG Ratio: 2.2 (calc) (ref 1.0–2.5)
ALT: 19 U/L (ref 6–29)
AST: 13 U/L (ref 10–35)
Albumin: 4.8 g/dL (ref 3.6–5.1)
Alkaline phosphatase (APISO): 66 U/L (ref 37–153)
BUN: 13 mg/dL (ref 7–25)
CO2: 31 mmol/L (ref 20–32)
Calcium: 10 mg/dL (ref 8.6–10.4)
Chloride: 103 mmol/L (ref 98–110)
Creat: 0.67 mg/dL (ref 0.50–0.99)
GFR, Est African American: 109 mL/min/{1.73_m2} (ref >=60)
GFR, Est Non African American: 94 mL/min/{1.73_m2} (ref >=60)
Globulin: 2.2 g/dL (calc) (ref 1.9–3.7)
Glucose, Bld: 101 mg/dL — ABNORMAL HIGH (ref 65–99)
Potassium: 4.3 mmol/L (ref 3.5–5.3)
Sodium: 143 mmol/L (ref 135–146)
Total Bilirubin: 0.4 mg/dL (ref 0.2–1.2)
Total Protein: 7 g/dL (ref 6.1–8.1)

## 2020-02-13 LAB — CBC WITH DIFFERENTIAL/PLATELET
Absolute Monocytes: 420 cells/uL (ref 200–950)
Basophils Absolute: 8 cells/uL (ref 0–200)
Basophils Relative: 0.1 %
Eosinophils Absolute: 0 cells/uL — ABNORMAL LOW (ref 15–500)
Eosinophils Relative: 0 %
HCT: 41.1 % (ref 35.0–45.0)
Hemoglobin: 13.7 g/dL (ref 11.7–15.5)
Lymphs Abs: 1013 cells/uL (ref 850–3900)
MCH: 30.2 pg (ref 27.0–33.0)
MCHC: 33.3 g/dL (ref 32.0–36.0)
MCV: 90.5 fL (ref 80.0–100.0)
MPV: 10.2 fL (ref 7.5–12.5)
Monocytes Relative: 5.6 %
Neutro Abs: 6060 cells/uL (ref 1500–7800)
Neutrophils Relative %: 80.8 %
Platelets: 285 10*3/uL (ref 140–400)
RBC: 4.54 10*6/uL (ref 3.80–5.10)
RDW: 12.3 % (ref 11.0–15.0)
Total Lymphocyte: 13.5 %
WBC: 7.5 10*3/uL (ref 3.8–10.8)

## 2020-02-13 LAB — URINALYSIS W MICROSCOPIC + REFLEX CULTURE
Bacteria, UA: NONE SEEN /HPF
Bilirubin Urine: NEGATIVE
Glucose, UA: NEGATIVE
Hgb urine dipstick: NEGATIVE
Hyaline Cast: NONE SEEN /LPF
Ketones, ur: NEGATIVE
Leukocyte Esterase: NEGATIVE
Nitrites, Initial: NEGATIVE
Protein, ur: NEGATIVE
RBC / HPF: NONE SEEN /HPF (ref 0–2)
Specific Gravity, Urine: 1.007 (ref 1.001–1.03)
Squamous Epithelial / HPF: NONE SEEN /HPF (ref <=5)
WBC, UA: NONE SEEN /HPF (ref 0–5)
pH: >=8.5 — AB (ref 5.0–8.0)

## 2020-02-13 LAB — HEMOGLOBIN A1C
Hgb A1c MFr Bld: 5.8 % of total Hgb — ABNORMAL HIGH (ref <5.7)
Mean Plasma Glucose: 120 mg/dL
eAG (mmol/L): 6.6 mmol/L

## 2020-02-13 LAB — LIPID PANEL
Cholesterol: 177 mg/dL (ref <200)
HDL: 72 mg/dL (ref >=50)
LDL Cholesterol (Calc): 89 mg/dL (calc)
Non-HDL Cholesterol (Calc): 105 mg/dL (calc) (ref <130)
Total CHOL/HDL Ratio: 2.5 (calc) (ref <5.0)
Triglycerides: 72 mg/dL (ref <150)

## 2020-02-13 LAB — TSH: TSH: 0.07 mIU/L — ABNORMAL LOW (ref 0.40–4.50)

## 2020-02-13 LAB — NO CULTURE INDICATED

## 2020-02-13 LAB — VITAMIN B12: Vitamin B-12: 1132 pg/mL — ABNORMAL HIGH (ref 200–1100)

## 2020-02-13 LAB — MAGNESIUM: Magnesium: 2.2 mg/dL (ref 1.5–2.5)

## 2020-02-13 LAB — VITAMIN D 25 HYDROXY (VIT D DEFICIENCY, FRACTURES): Vit D, 25-Hydroxy: 45 ng/mL (ref 30–100)

## 2020-02-27 ENCOUNTER — Other Ambulatory Visit: Payer: Self-pay | Admitting: Internal Medicine

## 2020-02-27 DIAGNOSIS — R002 Palpitations: Secondary | ICD-10-CM

## 2020-02-27 MED ORDER — ALPRAZOLAM 0.25 MG PO TABS: ORAL_TABLET | ORAL | 0 refills | Status: DC

## 2020-03-07 ENCOUNTER — Ambulatory Visit: Payer: 59 | Admitting: Obstetrics & Gynecology

## 2020-03-16 ENCOUNTER — Other Ambulatory Visit: Payer: Self-pay | Admitting: Internal Medicine

## 2020-03-25 ENCOUNTER — Other Ambulatory Visit: Payer: 59

## 2020-03-25 ENCOUNTER — Other Ambulatory Visit: Payer: Self-pay

## 2020-03-25 DIAGNOSIS — R7989 Other specified abnormal findings of blood chemistry: Secondary | ICD-10-CM

## 2020-03-26 LAB — TSH: TSH: 1.1 mIU/L (ref 0.40–4.50)

## 2020-04-07 DIAGNOSIS — R002 Palpitations: Secondary | ICD-10-CM

## 2020-04-07 MED ORDER — ALPRAZOLAM 0.25 MG PO TABS: ORAL_TABLET | ORAL | 0 refills | Status: DC

## 2020-04-29 ENCOUNTER — Telehealth: Payer: Self-pay | Admitting: Gastroenterology

## 2020-04-29 NOTE — Telephone Encounter (Signed)
Spoke with patient, reports pain between her ribs that radiates to her back, she also reports an extreme amount of gas. She states that they are not GERD symptoms and she tried drinking an Aloe vera drink for over a week and it did not help. Patient reports that she is taking the Omeprazole 40 mg daily, she states that she has not taken the Levsin. Recommended that she begin taking the Levsin that was prescribed for the pain. Advised patient that she will need an appt for reassessment, pt was offered a sooner appt with an APP but declined because she prefers to see Dr. Loletha Carrow. Pt is scheduled for a follow up on Monday, 06/02/20 at 8:40 AM. Patient will call back periodically to see if there have been any cancellations and she has also been added to the cancellation list. Patient will call back if no improvement. Patient verbalized understanding of all information and had no concerns at the end of the call.

## 2020-04-29 NOTE — Telephone Encounter (Signed)
Inbound call from patient requesting a call back from a nurse please.  States she has been experiencing on and off severe upper abdominal pain for the last week.  Informed patient next availability for an office visit will be in 2 weeks but wants to be seen sooner.  Please advise.

## 2020-05-06 ENCOUNTER — Other Ambulatory Visit: Payer: Self-pay | Admitting: Internal Medicine

## 2020-05-06 ENCOUNTER — Other Ambulatory Visit: Payer: Self-pay | Admitting: Adult Health Nurse Practitioner

## 2020-05-06 DIAGNOSIS — R002 Palpitations: Secondary | ICD-10-CM

## 2020-05-06 DIAGNOSIS — E782 Mixed hyperlipidemia: Secondary | ICD-10-CM

## 2020-05-06 MED ORDER — ROSUVASTATIN CALCIUM 20 MG PO TABS: ORAL_TABLET | ORAL | 3 refills | Status: DC

## 2020-05-20 ENCOUNTER — Encounter: Payer: Self-pay | Admitting: Adult Health

## 2020-05-20 ENCOUNTER — Ambulatory Visit (INDEPENDENT_AMBULATORY_CARE_PROVIDER_SITE_OTHER): Payer: 59 | Admitting: Adult Health

## 2020-05-20 ENCOUNTER — Ambulatory Visit: Payer: 59

## 2020-05-20 ENCOUNTER — Other Ambulatory Visit: Payer: Self-pay

## 2020-05-20 VITALS — BP 116/72 | HR 77 | Temp 96.6°F

## 2020-05-20 DIAGNOSIS — R350 Frequency of micturition: Secondary | ICD-10-CM

## 2020-05-20 DIAGNOSIS — N3010 Interstitial cystitis (chronic) without hematuria: Secondary | ICD-10-CM

## 2020-05-20 MED ORDER — AMITRIPTYLINE HCL 10 MG PO TABS: 10.0000 mg | ORAL_TABLET | Freq: Every day | ORAL | 0 refills | Status: DC

## 2020-05-20 NOTE — Patient Instructions (Addendum)
Try D- Mannose supplement - 500 mg daily     Amitriptyline tablets What is this medicine? AMITRIPTYLINE (a mee TRIP ti leen) is used to treat depression. This medicine may be used for other purposes; ask your health care provider or pharmacist if you have questions. COMMON BRAND NAME(S): Elavil, Vanatrip What should I tell my health care provider before I take this medicine? They need to know if you have any of these conditions:  an alcohol problem  asthma, difficulty breathing  bipolar disorder or schizophrenia  difficulty passing urine, prostate trouble  glaucoma  heart disease or previous heart attack  liver disease  over active thyroid  seizures  thoughts or plans of suicide, a previous suicide attempt, or family history of suicide attempt  an unusual or allergic reaction to amitriptyline, other medicines, foods, dyes, or preservatives  pregnant or trying to get pregnant  breast-feeding How should I use this medicine? Take this medicine by mouth with a drink of water. Follow the directions on the prescription label. You can take the tablets with or without food. Take your medicine at regular intervals. Do not take it more often than directed. Do not stop taking this medicine suddenly except upon the advice of your doctor. Stopping this medicine too quickly may cause serious side effects or your condition may worsen. A special MedGuide will be given to you by the pharmacist with each prescription and refill. Be sure to read this information carefully each time. Talk to your pediatrician regarding the use of this medicine in children. Special care may be needed. Overdosage: If you think you have taken too much of this medicine contact a poison control center or emergency room at once. NOTE: This medicine is only for you. Do not share this medicine with others. What if I miss a dose? If you miss a dose, take it as soon as you can. If it is almost time for your  next dose, take only that dose. Do not take double or extra doses. What may interact with this medicine? Do not take this medicine with any of the following medications:  arsenic trioxide  certain medicines used to regulate abnormal heartbeat or to treat other heart conditions  cisapride  droperidol  halofantrine  linezolid  MAOIs like Carbex, Eldepryl, Marplan, Nardil, and Parnate  methylene blue  other medicines for mental depression  phenothiazines like perphenazine, thioridazine and chlorpromazine  pimozide  probucol  procarbazine  sparfloxacin  St. John's Wort This medicine may also interact with the following medications:  atropine and related drugs like hyoscyamine, scopolamine, tolterodine and others  barbiturate medicines for inducing sleep or treating seizures, like phenobarbital  cimetidine  disulfiram  ethchlorvynol  thyroid hormones such as levothyroxine  ziprasidone This list may not describe all possible interactions. Give your health care provider a list of all the medicines, herbs, non-prescription drugs, or dietary supplements you use. Also tell them if you smoke, drink alcohol, or use illegal drugs. Some items may interact with your medicine. What should I watch for while using this medicine? Tell your doctor if your symptoms do not get better or if they get worse. Visit your doctor or health care professional for regular checks on your progress. Because it may take several weeks to see the full effects of this medicine, it is important to continue your treatment as prescribed by your doctor. Patients and their families should watch out for new or worsening thoughts of suicide or depression. Also watch out  for sudden changes in feelings such as feeling anxious, agitated, panicky, irritable, hostile, aggressive, impulsive, severely restless, overly excited and hyperactive, or not being able to sleep. If this happens, especially at the beginning of  treatment or after a change in dose, call your health care professional. Dennis Bast may get drowsy or dizzy. Do not drive, use machinery, or do anything that needs mental alertness until you know how this medicine affects you. Do not stand or sit up quickly, especially if you are an older patient. This reduces the risk of dizzy or fainting spells. Alcohol may interfere with the effect of this medicine. Avoid alcoholic drinks. Do not treat yourself for coughs, colds, or allergies without asking your doctor or health care professional for advice. Some ingredients can increase possible side effects. Your mouth may get dry. Chewing sugarless gum or sucking hard candy, and drinking plenty of water will help. Contact your doctor if the problem does not go away or is severe. This medicine may cause dry eyes and blurred vision. If you wear contact lenses you may feel some discomfort. Lubricating drops may help. See your eye doctor if the problem does not go away or is severe. This medicine can cause constipation. Try to have a bowel movement at least every 2 to 3 days. If you do not have a bowel movement for 3 days, call your doctor or health care professional. This medicine can make you more sensitive to the sun. Keep out of the sun. If you cannot avoid being in the sun, wear protective clothing and use sunscreen. Do not use sun lamps or tanning beds/booths. What side effects may I notice from receiving this medicine? Side effects that you should report to your doctor or health care professional as soon as possible:  allergic reactions like skin rash, itching or hives, swelling of the face, lips, or tongue  anxious  breathing problems  changes in vision  confusion  elevated mood, decreased need for sleep, racing thoughts, impulsive behavior  eye pain  fast, irregular heartbeat  feeling faint or lightheaded, falls  feeling agitated, angry, or irritable  fever with increased sweating  hallucination,  loss of contact with reality  seizures  stiff muscles  suicidal thoughts or other mood changes  tingling, pain, or numbness in the feet or hands  trouble passing urine or change in the amount of urine  trouble sleeping  unusually weak or tired  vomiting  yellowing of the eyes or skin Side effects that usually do not require medical attention (report to your doctor or health care professional if they continue or are bothersome):  change in sex drive or performance  change in appetite or weight  constipation  dizziness  dry mouth  nausea  tired  tremors  upset stomach This list may not describe all possible side effects. Call your doctor for medical advice about side effects. You may report side effects to FDA at 1-800-FDA-1088. Where should I keep my medicine? Keep out of the reach of children. Store at room temperature between 20 and 25 degrees C (68 and 77 degrees F). Throw away any unused medicine after the expiration date. NOTE: This sheet is a summary. It may not cover all possible information. If you have questions about this medicine, talk to your doctor, pharmacist, or health care provider.  2021 Elsevier/Gold Standard (2018-01-03 13:04:32)

## 2020-05-20 NOTE — Progress Notes (Signed)
Assessment and Plan:  Diagnoses and all orders for this visit:  Frequency of urination UTI versus OAB/IC  Will check UA, C&S. Treat as indicated Reviewed urology instructions for D- mannose and other lifestyle at length today -     Urinalysis, Routine w reflex microscopic -     Urine Culture  Chronic interstitial cystitis Once returns from travel, try low dose amitriptyline for IC, message witih progress;  -     amitriptyline (ELAVIL) 10 MG tablet; Take 1 tablet (10 mg total) by mouth at bedtime.  Further disposition pending results of labs. Discussed med's effects and SE's.   Over 25 minutes of exam, counseling, chart review, and critical decision making was performed.   Future Appointments  Date Time Provider [place]  06/02/2020  8:40 AM Loletha Carrow, Kirke Corin, MD [facility]  01/29/2021 10:45 AM Megan Salon, MD [facility]  02/12/2021 10:00 AM Liane Comber, NP [facility] None    ------------------------------------------------------------------------------------------------------------------   HPI LMP 01/20/2011   64 y.o.female with hx of chronic interstitial cystitis, bladder sling presents for evaluation of possible UTI.   She reports has had pelvic aching, pubic tenderness, urge without actual urine in her bladder. She has not noted hematuria, has noted urine odor, but persistent. She notes sx have been intermittently ongoing for several weeks. Denies urinary frequency. Denies flank pain, fever/chills, vaginal discharge or perineal burning.   She was concerned due to leaving country this Friday and wanted to make sure not an infection.   Last confirmed UTI was 11/2019, E. Coli resistant to cipro/levaquin, was treated by augmentin course (? Some nausea/GI upset), allergy to sulfa and nausea with macrobid  Recently seen 05/13/2020 by [facility] urology, Dr. Amalia Hailey for many years, hx of kidney stone s/p lithotripsy, bladder sling.  Patricia Honey, FNP -saw  on 05/13/2020  Had dip stick UA not suggestive of UTI at that time  She has not tried D- mannose that was suggested at that visit. Discussed at length. Had tried amitriptyline remotely for IC but 50 mg dose, couldn't tolerate with sedation. She reports takes xanax more for sleep, hasn't noted bladder effect with this.   Past Medical History:  Diagnosis Date  . Allergy   . ANAL FISSURE, HX OF 09/24/2008   Qualifier: Diagnosis of  By: Tamala Julian CMA, Claiborne Billings    . Anxiety   . Arthritis   . Fibroids    Uterine  . GERD (gastroesophageal reflux disease)   . HSV-2 (herpes simplex virus 2) infection   . Hyperlipidemia   . IBS (irritable bowel syndrome)   . Interstitial cystitis   . Kidney stone 2019   --Dr.Evans  . Nevus    vulva nevus  . Prediabetes      Allergies  Allergen Reactions  . Sulfa Antibiotics Hives, Shortness Of Breath and Rash  . Macrobid [Nitrofurantoin Macrocrystal] Nausea Only    Current Outpatient Medications on File Prior to Visit  Medication Sig  . ALPRAZolam (XANAX) 0.25 MG tablet TAKE 1/2 TO 1 TABLET AT BEDTIME ONLY IF NEEDED FOR SLEEP AND TRY TO LIMIT TO 5 DAYS PER WEEK TO AVOID ADDICTION AND DEMENTIA-SHOULD LAST 30 DAYS  . Cholecalciferol (VITAMIN D-3 PO) Take 1 capsule by mouth daily.  . Cyanocobalamin (B-12 PO) Take by mouth.  . estradiol (ESTRACE) 0.1 MG/GM vaginal cream 1 gram vaginally twice weekly  . estradiol (ESTRACE) 0.5 MG tablet Take 1 tablet (0.5 mg total) by mouth daily.  . fluconazole (DIFLUCAN) 150 MG tablet Take  one tablet at onset of symptoms and second tablet three days later for yeast.  . hyoscyamine (LEVSIN SL) 0.125 MG SL tablet One tablet three times a day as needed for abdominal pain  . medroxyPROGESTERone (PROVERA) 2.5 MG tablet Take 1 tablet (2.5 mg total) by mouth daily.  Marland Kitchen omeprazole (PRILOSEC) 40 MG capsule TAKE 1 CAPSULE DAILY TO PREVENT HEARTBURN & INDIGESTION  . oxybutynin (DITROPAN-XL) 10 MG 24 hr tablet Take 10 mg by mouth at  bedtime.  . polyethylene glycol powder (GLYCOLAX/MIRALAX) powder MIX 17 GRAMS AS DIRECTED TO TAKE BY MOUTH TWICE A DAY AS DIRECTED  . rosuvastatin (CRESTOR) 20 MG tablet Take  1 tablet  Daily  for Cholesterol  . valACYclovir (VALTREX) 500 MG tablet Take 1 tablet (500 mg total) by mouth daily.   No current facility-administered medications on file prior to visit.    ROS: all negative except above.   Physical Exam:  LMP 01/20/2011   General Appearance: Well nourished, in no apparent distress. Eyes: PERRL, conjunctiva no swelling or erythema ENT/Mouth: Mask in place; Hearing normal.  Neck: Supple Respiratory: Respiratory effort normal Cardio: Appears well perfused Abdomen: Soft, + BS.  Non tender, bladder non-palpable, no guarding, rebound, hernias, masses. Lymphatics: Non tender without lymphadenopathy.  Musculoskeletal: normal gait.  Skin: Warm, dry without rashes, lesions, ecchymosis.  Neuro: Normal muscle tone Psych: Awake and oriented X 3, normal affect, Insight and Judgment appropriate.     Patricia Ribas, NP 2:50 PM [facility]

## 2020-05-23 ENCOUNTER — Encounter: Payer: Self-pay | Admitting: Adult Health

## 2020-05-23 ENCOUNTER — Other Ambulatory Visit: Payer: Self-pay | Admitting: Adult Health

## 2020-05-23 DIAGNOSIS — N390 Urinary tract infection, site not specified: Secondary | ICD-10-CM

## 2020-05-23 HISTORY — DX: Urinary tract infection, site not specified: N39.0

## 2020-05-23 LAB — URINALYSIS, ROUTINE W REFLEX MICROSCOPIC
Bacteria, UA: NONE SEEN /HPF
Bilirubin Urine: NEGATIVE
Glucose, UA: NEGATIVE
Hgb urine dipstick: NEGATIVE
Hyaline Cast: NONE SEEN /LPF
Ketones, ur: NEGATIVE
Nitrite: NEGATIVE
Protein, ur: NEGATIVE
RBC / HPF: NONE SEEN /HPF (ref 0–2)
Specific Gravity, Urine: 1.008 (ref 1.001–1.035)
pH: 6.5 (ref 5.0–8.0)

## 2020-05-23 LAB — URINE CULTURE
MICRO NUMBER:: 11816134
SPECIMEN QUALITY:: ADEQUATE

## 2020-05-23 LAB — MICROSCOPIC MESSAGE

## 2020-05-23 MED ORDER — CEFUROXIME AXETIL 250 MG PO TABS: 250.0000 mg | ORAL_TABLET | Freq: Two times a day (BID) | ORAL | 0 refills | Status: DC

## 2020-06-02 ENCOUNTER — Ambulatory Visit: Payer: 59 | Admitting: Gastroenterology

## 2020-06-03 ENCOUNTER — Other Ambulatory Visit: Payer: Self-pay | Admitting: Adult Health

## 2020-06-03 MED ORDER — DOXYCYCLINE HYCLATE 100 MG PO CAPS: ORAL_CAPSULE | ORAL | 0 refills | Status: DC

## 2020-06-20 ENCOUNTER — Other Ambulatory Visit: Payer: Self-pay | Admitting: Adult Health

## 2020-06-20 DIAGNOSIS — R002 Palpitations: Secondary | ICD-10-CM

## 2020-06-20 MED ORDER — ALPRAZOLAM 0.25 MG PO TABS: ORAL_TABLET | ORAL | 0 refills | Status: DC

## 2020-06-20 NOTE — Progress Notes (Signed)
Future Appointments  Date Time Provider [place]  01/29/2021 10:45 AM Megan Salon, MD [facility]  02/12/2021 10:00 AM Liane Comber, NP [facility] None    PDMP reviewed for xanax refill request.

## 2020-07-24 ENCOUNTER — Telehealth: Payer: Self-pay | Admitting: Gastroenterology

## 2020-07-24 NOTE — Telephone Encounter (Signed)
Spoke with patient in regards to information. She has been scheduled to see Tye Savoy, NP tomorrow at 9:30 am. Patient would like to keep appt with Dr. Loletha Carrow as well unless they can figure out what is going on. Patient is aware that if pain is severe then she will need to go to the ED for evaluation. Patient verbalized understanding of all information and had no concerns at the end of the call.   Barbette Or, CMA has been notified of the add-on.

## 2020-07-24 NOTE — Telephone Encounter (Signed)
Chart reviewed. Has had right-sided pain in the past without etiology identified.  Not sure if this is the same. There has been no evidence of diverticulosis on abdominal imaging or most recent colonoscopy in 2014.  Not sure that this would be consistent with diverticulitis. Antispasmodics and bowel movements have not helped. I cannot make any empiric recommendations without more formal evaluation.  Thus, get her in to see one of the apps today or tomorrow.  Or she could see her PCP.  Or if the pain is severe, she should go to the emergency room for more formal and extensive evaluation.

## 2020-07-24 NOTE — Telephone Encounter (Signed)
Dr. Henrene Pastor, as DOD PM of 07/24/20  [name] pt, Hx of RUQ pain, change in bowel habits, IBS-C several years ago. Pt thinks she may have diverticulitis.  Spoke with patient, she states that she has been having RLQ pain off and on for a while now. She reports that last night the pain was so severe that she broke out into sweats. Denies any fever. She states that the RLQ is tender to the touch. Reports lack of appetite and nausea. She states that even if she has a bowel movement there is no relief. She describes as "pressure". Patient states that she takes the Levsin at bedtime but does not seem to give her any relief. Patient states that she thinks she has had diverticulitis before. Patient has been scheduled for a follow up with Dr. Loletha Carrow on Monday, 08/11/20 at 3:40 PM. Please advise, thanks.

## 2020-07-24 NOTE — Telephone Encounter (Signed)
Inbound call from pt requesting a call back stating that she has nausea, and lower abd pain on the right side. Please advise. Thanks.

## 2020-07-25 ENCOUNTER — Encounter: Payer: Self-pay | Admitting: Nurse Practitioner

## 2020-07-25 ENCOUNTER — Ambulatory Visit: Payer: 59 | Admitting: Nurse Practitioner

## 2020-07-25 VITALS — BP 90/60 | HR 72 | Ht 63.0 in | Wt 108.0 lb

## 2020-07-25 DIAGNOSIS — R1031 Right lower quadrant pain: Secondary | ICD-10-CM | POA: Diagnosis not present

## 2020-07-25 NOTE — Telephone Encounter (Signed)
I am checking messages while out of the office this week.   Agree with clinic evaluation - thank you for arranging that.  - HD

## 2020-07-25 NOTE — Patient Instructions (Signed)
Increase Miralax to 1 capful daily in 8 ounces of liquid, if more than 1 to 2 bowel movements then decrease to every other day.   Keep follow up with Dr. Loletha Carrow.  If you are age 62 or older, your body mass index should be between 23-30. Your Body mass index is 19.13 kg/m. If this is out of the aforementioned range listed, please consider follow up with your Primary Care Provider.  If you are age 6 or younger, your body mass index should be between 19-25. Your Body mass index is 19.13 kg/m. If this is out of the aformentioned range listed, please consider follow up with your Primary Care Provider.   __________________________________________________________  The [place] [facility] providers would like to encourage you to use [facility] to communicate with providers for non-urgent requests or questions.  Due to long hold times on the telephone, sending your provider a message by [facility] may be a faster and more efficient way to get a response.  Please allow 48 business hours for a response.  Please remember that this is for non-urgent requests.

## 2020-07-25 NOTE — Progress Notes (Signed)
ASSESSMENT AND PLAN    # 65 yo old female with acute onset of nausea and severe RLQ pain yesterday morning.  It is difficult to sort out as patient has chronic intermittent RLQ possibly related to IBS, constipation, adhesions, musculoskeletal origin? She also has chronic, intermittent right groin pain and right hip pain for which she receives injections.  --We discussed obtaining a CT scan to evaluate the acute worsening of her chronic RLQ pain. However she had some improvement following two bowel movements and her abdominal exam doesn't suggest an acute process such as appendicitis.   --Plan is to treat for constipation with daily Miralax . She will keep her follow up with Dr. Loletha Carrow on 7/18 for reassessment of pain. In the interim she will contact us should pain worsen, she becomes febrile, etc.     # History of interstitial cystitis / UTIs. Followed by Urology   HISTORY OF PRESENT ILLNESS    Chief Complaint : abdominal pain and nausea.   Patricia Ryan is a 65 y.o. female known to Dr. Loletha Carrow with a past medical history significant for IBS with constipation, interstitial cystitis, nephrolithiasis, hyperlipidemia, GERD, cholecystectomy. See PMH below for any additional medical problems.    Patricia Ryan was seen here in August 2021 for evaluation of RUQ pain and altered bowel habits.  She underwent an EGD which was unrevealing.  Following that, MRCP for biliary colic type pain was negative for ductal dilation/choledocholithiasis .    INTERVAL HISTORY :   RUQ Pain Patricia Ryan has an appointment to see Dr. Loletha Carrow on 08/11/2020 which she says is to review her endoscopic findings done for RUQ pain ( though EGD was done nearly a year ago?). She is no longer bothered by the RUQ pain unless she tries to stop PPI ( recommended by PCP).   RLQ pain.  Patricia Ryan came in today for evaluation of RLQ pain . She has had chronic, intermittent RLQ for at least a year. Pain occurs randomly with no relationship to  physical activity, eating or bowel movements.   The pain has not affect her sleep until yesterday morning.  She takes Miralax as needed which is usually in the absence of a bowel movement over 3 to 4-day.  She has a history of interstitial cystitis as well as urinary tract infections and has discussed RLQ pain with urology.  Patient cannot correlate the pain with UTIs and her neurologist apparently does not feel the pain is related to her bladder mesh. She has also mentioned the pain to her GYN.   Yesterday morning patient awoke at 3 am with nausea and severe, "twisting"  RLQ pain. The pain was much worse than any previous episode. Though defecation historically hasn't helped the RLQ pain she did feel better after having two BMs. However,  she continued to feel nauseated and unwell the remainder of the day. Today she feels a little better but lower abdomen is sore. She hasn't been febrile. No urinary symptoms.    Of note, patient also gives a history of chronic, intermittent right groin pain of unclear etiology but clearly different then the RLQ pain described above. She has a history of right hip pain requiring injections. She thinks the right groin pain may be activity related.    PREVIOUS ENDOSCOPIC EVALUATIONS / PERTINENT STUDIES:   2014 screening colonoscopy --3 to 5 mm sessile polyp (hyperplastic)   August 2021 EGD for RUQ and epigastric pain -Patchy gastric erythema  Surgical [P], random sites gastric -  GASTRIC ANTRAL MUCOSA WITH NONSPECIFIC REACTIVE GASTROPATHY - GASTRIC OXYNTIC MUCOSA WITH PARIETAL CELL HYPERPLASIA AS CAN BE SEEN IN HYPERGASTRINEMIC STATES SUCH AS PPI THERAPY. - WARTHIN STARRY STAIN IS NEGATIVE FOR HELICOBACTER PYLORI    Past Medical History:  Diagnosis Date   Allergy    ANAL FISSURE, HX OF 09/24/2008   Qualifier: Diagnosis of  By: Tamala Julian CMA, Claiborne Billings     Anxiety    Arthritis    Fibroids    Uterine   GERD (gastroesophageal reflux disease)    HSV-2 (herpes  simplex virus 2) infection    Hyperlipidemia    IBS (irritable bowel syndrome)    Interstitial cystitis    Kidney stone 2019   --Dr.Evans   Nevus    vulva nevus   Prediabetes     Current Medications, Allergies, Past Surgical History, Family History and Social History were reviewed in [employer] record.   Current Outpatient Medications  Medication Sig Dispense Refill   ALPRAZolam (XANAX) 0.25 MG tablet TAKE 1/2 TO 1 TABLET AT BEDTIME ONLY IF NEEDED FOR SLEEP AND TRY TO LIMIT TO 5 DAYS PER WEEK TO AVOID ADDICTION AND DEMENTIA-SHOULD LAST 30 DAYS 25 tablet 0   amitriptyline (ELAVIL) 10 MG tablet Take 1 tablet (10 mg total) by mouth at bedtime. 30 tablet 0   CALCIUM CITRATE PO Take 1,200 mg by mouth daily.     cefUROXime (CEFTIN) 250 MG tablet Take 1 tablet (250 mg total) by mouth 2 (two) times daily with a meal. 10 tablet 0   Cholecalciferol (VITAMIN D-3 PO) Take 1 capsule by mouth daily.     Cyanocobalamin (B-12 PO) Take by mouth.     doxycycline (VIBRAMYCIN) 100 MG capsule Take 2 capsule once with food for Lyme's prophylaxis. 2 capsule 0   estradiol (ESTRACE) 0.1 MG/GM vaginal cream 1 gram vaginally twice weekly 42.5 g 3   estradiol (ESTRACE) 0.5 MG tablet Take 1 tablet (0.5 mg total) by mouth daily. 30 tablet 4   fluconazole (DIFLUCAN) 150 MG tablet Take one tablet at onset of symptoms and second tablet three days later for yeast. 2 tablet 0   hyoscyamine (LEVSIN SL) 0.125 MG SL tablet One tablet three times a day as needed for abdominal pain 45 tablet 0   medroxyPROGESTERone (PROVERA) 2.5 MG tablet Take 1 tablet (2.5 mg total) by mouth daily. 30 tablet 4   omeprazole (PRILOSEC) 40 MG capsule TAKE 1 CAPSULE DAILY TO PREVENT HEARTBURN & INDIGESTION 90 capsule 1   oxybutynin (DITROPAN-XL) 10 MG 24 hr tablet Take 10 mg by mouth at bedtime.     polyethylene glycol powder (GLYCOLAX/MIRALAX) powder MIX 17 GRAMS AS DIRECTED TO TAKE BY MOUTH TWICE A DAY AS DIRECTED 255 g  3   rosuvastatin (CRESTOR) 20 MG tablet Take  1 tablet  Daily  for Cholesterol 90 tablet 3   valACYclovir (VALTREX) 500 MG tablet Take 1 tablet (500 mg total) by mouth daily. 90 tablet 4   No current facility-administered medications for this visit.    Review of Systems: No chest pain. No shortness of breath. No urinary complaints.   PHYSICAL EXAM :    Wt Readings from Last 3 Encounters:  05/20/20 107 lb (48.5 kg)  02/12/20 111 lb 3.2 oz (50.4 kg)  01/02/20 110 lb (49.9 kg)    LMP 01/20/2011  Constitutional:  Pleasant female in no acute distress. Psychiatric: Normal mood and affect. Behavior is normal. EENT: Pupils normal.  Conjunctivae are normal. No scleral icterus.  Neck supple.  Cardiovascular: Normal rate, regular rhythm. No edema Pulmonary/chest: Effort normal and breath sounds normal. No wheezing, rales or rhonchi. Abdominal: Soft, nondistended. Mild generalized lower abdominal tenderness.  Moderate RLQ tenderness of the distal portion of RLQ at the edge of old C-section scar. Negative psoas and obturator sign. Bowel sounds active throughout. There are no masses palpable.  Neurological: Alert and oriented to person place and time. Skin: Skin is warm and dry. No rashes noted.  I spent 30 minutes total reviewing records, obtaining history, performing exam, counseling patient and documenting visit / findings.   Tye Savoy, NP  07/25/2020, 9:22 AM

## 2020-07-28 ENCOUNTER — Other Ambulatory Visit: Payer: Self-pay | Admitting: Adult Health

## 2020-07-28 DIAGNOSIS — R002 Palpitations: Secondary | ICD-10-CM

## 2020-07-29 NOTE — Progress Notes (Signed)
____________________________________________________________  Attending physician addendum:  Thank you for sending this case to me. I have reviewed the entire note and agree with the plan.   Anu Stagner [name], MD  ____________________________________________________________  

## 2020-08-11 ENCOUNTER — Encounter: Payer: Self-pay | Admitting: Gastroenterology

## 2020-08-11 ENCOUNTER — Ambulatory Visit: Payer: 59 | Admitting: Gastroenterology

## 2020-08-11 VITALS — BP 104/68 | HR 75 | Ht 63.0 in | Wt 111.0 lb

## 2020-08-11 DIAGNOSIS — R1031 Right lower quadrant pain: Secondary | ICD-10-CM | POA: Diagnosis not present

## 2020-08-11 DIAGNOSIS — K5909 Other constipation: Secondary | ICD-10-CM | POA: Diagnosis not present

## 2020-08-11 NOTE — Patient Instructions (Signed)
If you are age 63 or older, your body mass index should be between 23-30. Your Body mass index is 19.66 kg/m. If this is out of the aforementioned range listed, please consider follow up with your Primary Care Provider.  If you are age 97 or younger, your body mass index should be between 19-25. Your Body mass index is 19.66 kg/m. If this is out of the aformentioned range listed, please consider follow up with your Primary Care Provider.   __________________________________________________________  The [place] [facility] providers would like to encourage you to use [facility] to communicate with providers for non-urgent requests or questions.  Due to long hold times on the telephone, sending your provider a message by [facility] may be a faster and more efficient way to get a response.  Please allow 48 business hours for a response.  Please remember that this is for non-urgent requests.    You have been given samples of Linzess 72 mcg and 145 mcg.  Take one 30 minutes before your first meal of the day.  Let us know if one works well enough that you would like [place] to send in a prescription.

## 2020-08-11 NOTE — Progress Notes (Signed)
Assessment and Plan:  Patricia Ryan was seen today for Hair loss  Diagnoses and all orders for this visit:  Hair loss -     TSH -     COMPLETE METABOLIC PANEL WITH GFR -     CBC with Differential/Platelet       - If labs come back normal  I would be suspicious for thyroid storm which returned to normal value,  hair loss would generally occur 6 months after the incident which corresponds with abnormal lab.  Will treat pending results.   Abnormal thyroid blood test -     TSH -     COMPLETE METABOLIC PANEL WITH GFR -     CBC with Differential/Platelet  Bilateral carpal tunnel syndrome      - Pt to restart use of cock up splints and limit time on phone, monitor symptoms   Further disposition pending results of labs. Discussed med's effects and SE's.   Over 30 minutes of exam, counseling, chart review, and critical decision making was performed.   Future Appointments  Date Time Provider [place]  08/11/2020  3:40 PM [name], Kirke Corin, MD [facility]  08/12/2020 10:00 AM Magda Bernheim, NP [facility] None  01/29/2021 10:45 AM Megan Salon, MD [facility]  02/12/2021 10:00 AM Liane Comber, NP [facility] None    ------------------------------------------------------------------------------------------------------------------   HPI LMP 01/20/2011  65 y.o.very pleasant female who has noted hair loss for the past month, losing 2 handfuls of hair with brushing and washing hair.  Pt feels cold frequently, dry skin, constipation and has just started Linzess. Had got down to 105 without trying but has started to regain.   She had an abnormal TSH 02/12/20 of 0.07 and has never had a thyroid disorder or taken thyroid medication, follow up 03/25/20 was normal at 1.10.  Pt has also been experiencing tingling of hands bilaterally.  She has a past history of carpal tunnel and previously used cock up splints with good relief of symptoms.  She has not used them for over a year and reports she  is using her phone much more frequently.   BMI is Body mass index is 19.77 kg/m., she has not been working on diet and exercise. Wt Readings from Last 3 Encounters:  08/12/20 111 lb 9.6 oz (50.6 kg)  08/11/20 111 lb (50.3 kg)  07/25/20 108 lb (49 kg)    Past Medical History:  Diagnosis Date   Allergy    ANAL FISSURE, HX OF 09/24/2008   Qualifier: Diagnosis of  By: Tamala Julian CMA, Claiborne Billings     Anxiety    Arthritis    Fibroids    Uterine   GERD (gastroesophageal reflux disease)    HSV-2 (herpes simplex virus 2) infection    Hyperlipidemia    IBS (irritable bowel syndrome)    Interstitial cystitis    Kidney stone 2019   --Dr.Evans   Nevus    vulva nevus   Prediabetes      Allergies  Allergen Reactions   Sulfa Antibiotics Hives, Shortness Of Breath and Rash   Macrobid [Nitrofurantoin Macrocrystal] Nausea Only    Current Outpatient Medications on File Prior to Visit  Medication Sig   ALPRAZolam (XANAX) 0.25 MG tablet TAKE 1/2 TO 1 TABLET BY MOUTH EVERY NIGHT AT BEDTIME ONLY IF NEEDED FOR SLEEP AND TRY TO LIMIT TO 5 DAYS PER WEEK TO AVOID ADDICTION AND DEMENTIA *SHOULD LAST 30 DAYS   amitriptyline (ELAVIL) 10 MG tablet Take 1 tablet (10  mg total) by mouth at bedtime.   CALCIUM CITRATE PO Take 1,200 mg by mouth daily.   Cholecalciferol (VITAMIN D-3 PO) Take 1 capsule by mouth daily.   Cyanocobalamin (B-12 PO) Take by mouth.   estradiol (ESTRACE) 0.1 MG/GM vaginal cream 1 gram vaginally twice weekly   estradiol (ESTRACE) 0.5 MG tablet Take 1 tablet (0.5 mg total) by mouth daily.   hyoscyamine (LEVSIN SL) 0.125 MG SL tablet One tablet three times a day as needed for abdominal pain   medroxyPROGESTERone (PROVERA) 2.5 MG tablet Take 1 tablet (2.5 mg total) by mouth daily.   omeprazole (PRILOSEC) 40 MG capsule TAKE 1 CAPSULE DAILY TO PREVENT HEARTBURN & INDIGESTION   oxybutynin (DITROPAN-XL) 10 MG 24 hr tablet Take 10 mg by mouth at bedtime.   polyethylene glycol powder (GLYCOLAX/MIRALAX)  powder MIX 17 GRAMS AS DIRECTED TO TAKE BY MOUTH TWICE A DAY AS DIRECTED   rosuvastatin (CRESTOR) 20 MG tablet Take  1 tablet  Daily  for Cholesterol   valACYclovir (VALTREX) 500 MG tablet Take 1 tablet (500 mg total) by mouth daily.   No current facility-administered medications on file prior to visit.    ROS: all negative except above.   Physical Exam:   General Appearance: Well nourished, in no apparent distress. Eyes: PERRLA, EOMs, conjunctiva no swelling or erythema Sinuses: No Frontal/maxillary tenderness ENT/Mouth: Ext aud canals clear, TMs without erythema, bulging. No erythema, swelling, or exudate on post pharynx.  Tonsils not swollen or erythematous. Hearing normal.  Neck: Supple, thyroid normal.  Respiratory: Respiratory effort normal, BS equal bilaterally without rales, rhonchi, wheezing or stridor.  Cardio: RRR with no MRGs. Brisk peripheral pulses without edema.  Abdomen: Soft, + BS.  Non tender, no guarding, rebound, hernias, masses. Lymphatics: Non tender without lymphadenopathy.  Musculoskeletal: Full ROM, 5/5 strength, normal gait.  Skin: Warm, dry without rashes, lesions, ecchymosis.  Neuro: Cranial nerves intact. Normal muscle tone, no cerebellar symptoms. Sensation intact.  Psych: Awake and oriented X 3, normal affect, Insight and Judgment appropriate.     Magda Bernheim, NP 3:01 PM [facility]

## 2020-08-11 NOTE — Progress Notes (Signed)
[facility] Progress Note  Chief Complaint: Right lower quadrant pain  Subjective  History: Seen here August 2021 for intermittent right upper quadrant pain and postprandial bloating that she felt was reminiscent of biliary colic.  LFTs normal, EGD 09/14/2019 normal, biopsies negative for H. pylori.  No CBD stone seen on MRCP 10/03/2019 Levsin prescribed. Called office a few weeks back right lower quadrant pain, seen in clinic by APP 07/25/2020, discussed CT scan but patient decided to hold off because was feeling better after some bowel movements.  Constipation treated with MiraLAX. Of note, patient has chronic hip problems and interstitial cystitis.  Ednamae tells me for the last year she has had intermittent right groin pain and is very specific pointing to that area.  She has chronic constipation and bloating from IBS as before. She has interstitial cystitis but that causes a suprapubic pain. With the recent episode of pain before being seen in clinic, she had a larger bowel movement than usual the night before and another the next day and then started getting relief of pain. No rectal bleeding.  ROS: Cardiovascular:  no chest pain Respiratory: no dyspnea  The patient's Past Medical, Family and Social History were reviewed and are on file in the EMR.  Objective:  Med list reviewed  Current Outpatient Medications:    ALPRAZolam (XANAX) 0.25 MG tablet, TAKE 1/2 TO 1 TABLET BY MOUTH EVERY NIGHT AT BEDTIME ONLY IF NEEDED FOR SLEEP AND TRY TO LIMIT TO 5 DAYS PER WEEK TO AVOID ADDICTION AND DEMENTIA *SHOULD LAST 30 DAYS, Disp: 25 tablet, Rfl: 0   amitriptyline (ELAVIL) 10 MG tablet, Take 1 tablet (10 mg total) by mouth at bedtime. (Patient taking differently: Take 10 mg by mouth as needed.), Disp: 30 tablet, Rfl: 0   CALCIUM CITRATE PO, Take 1,200 mg by mouth daily., Disp: , Rfl:    Cholecalciferol (VITAMIN D-3 PO), Take 1 capsule by mouth daily., Disp: , Rfl:    Cyanocobalamin  (B-12 PO), Take by mouth., Disp: , Rfl:    estradiol (ESTRACE) 0.1 MG/GM vaginal cream, 1 gram vaginally twice weekly, Disp: 42.5 g, Rfl: 3   estradiol (ESTRACE) 0.5 MG tablet, Take 1 tablet (0.5 mg total) by mouth daily. (Patient taking differently: Take 0.25 mg by mouth daily. 1/2 tablet every two days), Disp: 30 tablet, Rfl: 4   hyoscyamine (LEVSIN SL) 0.125 MG SL tablet, One tablet three times a day as needed for abdominal pain (Patient taking differently: Take 0.125 mg by mouth as needed.), Disp: 45 tablet, Rfl: 0   medroxyPROGESTERone (PROVERA) 2.5 MG tablet, Take 1 tablet (2.5 mg total) by mouth daily., Disp: 30 tablet, Rfl: 4   omeprazole (PRILOSEC) 40 MG capsule, TAKE 1 CAPSULE DAILY TO PREVENT HEARTBURN & INDIGESTION, Disp: 90 capsule, Rfl: 1   oxybutynin (DITROPAN-XL) 10 MG 24 hr tablet, Take 10 mg by mouth at bedtime., Disp: , Rfl:    polyethylene glycol powder (GLYCOLAX/MIRALAX) powder, MIX 17 GRAMS AS DIRECTED TO TAKE BY MOUTH TWICE A DAY AS DIRECTED (Patient taking differently: MIX 17 GRAMS AS DIRECTED TO TAKE BY MOUTH AS NEEDED), Disp: 255 g, Rfl: 3   rosuvastatin (CRESTOR) 20 MG tablet, Take  1 tablet  Daily  for Cholesterol, Disp: 90 tablet, Rfl: 3   valACYclovir (VALTREX) 500 MG tablet, Take 1 tablet (500 mg total) by mouth daily., Disp: 90 tablet, Rfl: 4   Vital signs in last 24 hrs: Vitals:   08/11/20 1556  BP: 104/68  Pulse:  75   Wt Readings from Last 3 Encounters:  08/11/20 111 lb (50.3 kg)  07/25/20 108 lb (49 kg)  05/20/20 107 lb (48.5 kg)    Physical Exam  HEENT: sclera anicteric, oral mucosa moist without lesions Neck: supple, no thyromegaly, JVD or lymphadenopathy Cardiac: RRR without murmurs, S1S2 heard, no peripheral edema Pulm: clear to auscultation bilaterally, normal RR and effort noted Abdomen: soft, no tenderness, with active bowel sounds. No guarding or palpable hepatosplenomegaly.  Again, she points to the right inguinal region as the locus of  pain. Skin; warm and dry, no jaundice or rash  Labs:   ___________________________________________ Radiologic studies:   ____________________________________________ Other:   _____________________________________________ Assessment & Plan  Assessment: Encounter Diagnoses  Name Primary?   RLQ abdominal pain Yes   Chronic constipation    Lower lower quadrant/groin pain, difficult to characterize.  Lower than expected for something digestive.  She says neither her urologist and her gynecologist thought it was a cause in those areas. CT scan seems unlikely to be revealing given the location of pain. Possibly an evolution of her IBS with some intestinal spasm or later to more severe episodic constipation.  She takes MiraLAX daily and does not think she can increase it to twice daily because it will cause loose stool. This could still be done periodically if she is having pain just for couple of days. Plan: We gave her samples of Linzess 72 and 145 mcg, start with lower dose for a week, go up on dose if needed. She will contact us in a few weeks with an update on whether or not that helped.  23 minutes were spent on this encounter (including chart review, history/exam, counseling/coordination of care, and documentation) > 50% of that time was spent on counseling and coordination of care.  Topics discussed included: See above.  Patricia Ryan

## 2020-08-12 ENCOUNTER — Other Ambulatory Visit: Payer: Self-pay

## 2020-08-12 ENCOUNTER — Ambulatory Visit: Payer: 59 | Admitting: Nurse Practitioner

## 2020-08-12 ENCOUNTER — Encounter: Payer: Self-pay | Admitting: Nurse Practitioner

## 2020-08-12 VITALS — BP 102/62 | HR 66 | Temp 97.5°F | Wt 111.6 lb

## 2020-08-12 DIAGNOSIS — G5603 Carpal tunnel syndrome, bilateral upper limbs: Secondary | ICD-10-CM

## 2020-08-12 DIAGNOSIS — R7989 Other specified abnormal findings of blood chemistry: Secondary | ICD-10-CM

## 2020-08-12 DIAGNOSIS — L659 Nonscarring hair loss, unspecified: Secondary | ICD-10-CM

## 2020-08-12 LAB — COMPLETE METABOLIC PANEL WITH GFR
AG Ratio: 2.1 (calc) (ref 1.0–2.5)
ALT: 17 U/L (ref 6–29)
AST: 20 U/L (ref 10–35)
Albumin: 4.5 g/dL (ref 3.6–5.1)
Alkaline phosphatase (APISO): 62 U/L (ref 37–153)
BUN: 11 mg/dL (ref 7–25)
CO2: 30 mmol/L (ref 20–32)
Calcium: 10.2 mg/dL (ref 8.6–10.4)
Chloride: 104 mmol/L (ref 98–110)
Creat: 0.67 mg/dL (ref 0.50–1.05)
Globulin: 2.1 g/dL (calc) (ref 1.9–3.7)
Glucose, Bld: 101 mg/dL — ABNORMAL HIGH (ref 65–99)
Potassium: 4.8 mmol/L (ref 3.5–5.3)
Sodium: 141 mmol/L (ref 135–146)
Total Bilirubin: 0.5 mg/dL (ref 0.2–1.2)
Total Protein: 6.6 g/dL (ref 6.1–8.1)
eGFR: 98 mL/min/{1.73_m2} (ref >=60)

## 2020-08-12 LAB — CBC WITH DIFFERENTIAL/PLATELET
Absolute Monocytes: 432 cells/uL (ref 200–950)
Basophils Absolute: 40 cells/uL (ref 0–200)
Basophils Relative: 1 %
Eosinophils Absolute: 72 cells/uL (ref 15–500)
Eosinophils Relative: 1.8 %
HCT: 40.4 % (ref 35.0–45.0)
Hemoglobin: 13.4 g/dL (ref 11.7–15.5)
Lymphs Abs: 1152 cells/uL (ref 850–3900)
MCH: 30.8 pg (ref 27.0–33.0)
MCHC: 33.2 g/dL (ref 32.0–36.0)
MCV: 92.9 fL (ref 80.0–100.0)
MPV: 10.4 fL (ref 7.5–12.5)
Monocytes Relative: 10.8 %
Neutro Abs: 2304 cells/uL (ref 1500–7800)
Neutrophils Relative %: 57.6 %
Platelets: 240 10*3/uL (ref 140–400)
RBC: 4.35 10*6/uL (ref 3.80–5.10)
RDW: 11.6 % (ref 11.0–15.0)
Total Lymphocyte: 28.8 %
WBC: 4 10*3/uL (ref 3.8–10.8)

## 2020-08-12 LAB — TSH: TSH: 1.07 mIU/L (ref 0.40–4.50)

## 2020-08-25 ENCOUNTER — Other Ambulatory Visit: Payer: Self-pay | Admitting: Adult Health

## 2020-08-25 DIAGNOSIS — R002 Palpitations: Secondary | ICD-10-CM

## 2020-08-29 ENCOUNTER — Other Ambulatory Visit: Payer: Self-pay | Admitting: Obstetrics & Gynecology

## 2020-08-29 DIAGNOSIS — N952 Postmenopausal atrophic vaginitis: Secondary | ICD-10-CM

## 2020-09-15 ENCOUNTER — Other Ambulatory Visit: Payer: Self-pay | Admitting: Internal Medicine

## 2020-09-26 ENCOUNTER — Other Ambulatory Visit: Payer: Self-pay | Admitting: Adult Health

## 2020-09-26 DIAGNOSIS — R002 Palpitations: Secondary | ICD-10-CM

## 2020-10-03 LAB — HM MAMMOGRAPHY

## 2020-10-17 LAB — HM DEXA SCAN

## 2020-10-20 ENCOUNTER — Encounter: Payer: Self-pay | Admitting: Internal Medicine

## 2020-10-21 ENCOUNTER — Other Ambulatory Visit: Payer: Self-pay

## 2020-10-21 ENCOUNTER — Encounter: Payer: Self-pay | Admitting: Adult Health

## 2020-10-21 DIAGNOSIS — M810 Age-related osteoporosis without current pathological fracture: Secondary | ICD-10-CM | POA: Insufficient documentation

## 2020-10-25 ENCOUNTER — Other Ambulatory Visit: Payer: Self-pay | Admitting: Adult Health

## 2020-10-25 DIAGNOSIS — R002 Palpitations: Secondary | ICD-10-CM

## 2020-11-04 ENCOUNTER — Encounter: Payer: Self-pay | Admitting: Internal Medicine

## 2020-11-17 ENCOUNTER — Encounter: Payer: Self-pay | Admitting: Internal Medicine

## 2020-11-27 ENCOUNTER — Telehealth: Payer: Self-pay

## 2020-11-27 NOTE — Telephone Encounter (Signed)
Patient wants a refill on Xanax   Patricia Ryan on [employer]

## 2020-11-28 ENCOUNTER — Other Ambulatory Visit: Payer: Self-pay | Admitting: Nurse Practitioner

## 2020-11-28 DIAGNOSIS — R002 Palpitations: Secondary | ICD-10-CM

## 2020-11-28 MED ORDER — ALPRAZOLAM 0.25 MG PO TABS: ORAL_TABLET | ORAL | 0 refills | Status: DC

## 2020-12-04 NOTE — Progress Notes (Signed)
Assessment and Plan:  Patricia Ryan was seen today for pelvic pain.  Diagnoses and all orders for this visit:  RLQ abdominal pain/R groin pain Fairly benign exam; non-localizing discomfort Several years of recurrent episodes, has seen GI, GYN, urology, ortho Heat and rest helps Denies fever/chills, non-toxic appearing Recent history notable for constipation Discussed options; she is in agreement with holding off of labs/imaging Try bowel management - miralax with 2 tall glasses of water, can do twice/day in short term until has a movement then daily to maintain Add sennakot if needed to move Monitor for any blood in stool, fever/chills, worsening pain - contact office or go to ED for imagine due to weekend ? Could have adhesions due to several abdominal surgeries - continue heat, rest as needed, consider muscle relaxer, PT  Constipation, unspecified constipation type - Increase fiber/ water intake, decrease caffeine, increase activity level, can add  miralax  until BM soft. Sennakot if needed for movement. Please go to the hospital if you have severe abdominal pain, vomiting, fever, CP, SOB.   Urinary frequency IC; has been on oxybutynin, wants to try myrbetriq, given 2 week samples -     Urinalysis w microscopic + reflex cultur  Further disposition pending results of labs. Discussed med's effects and SE's.   Over 30 minutes of exam, counseling, chart review, and critical decision making was performed.   Future Appointments  Date Time Provider Leonardo  12/05/2020 10:00 AM Liane Comber, NP [facility] None  01/29/2021 10:45 AM Megan Salon, MD [facility]  02/12/2021 10:00 AM Magda Bernheim, NP [facility] None    ------------------------------------------------------------------------------------------------------------------   HPI LMP 01/20/2011  65 y.o.female with hx of IC, recurrent UTI presents for evaluation of lower abdomen pain.   She reports recurrent R groin  radiating pain across lower abdomen; she reports has had recurrent episodes for several years. She reports current episode began abruptly 3 days ago, without fever/chills. Pain is typically constant ache but eases off with rest and heat. Not triggered by food or particular movement.   She has baseline urinary urgency (taking oxybutynin XR), denies cloudy urine, odor, dysuria. Did take azo yesterday, didn't note benefit. She does endorse constipation consistent with baseline, takes miralax but admits hasn't taken in 2 days. Didn't like linzess given years ago. Denies blood in stool. No diverticulosis on last colonostopy 2014, saw Dr. Loletha Carrow with EGD/MRI/MRCP in 09/2019 that was fairly unremarkable. Follows with GYN, had WNL pelvic/PAP 01/02/2020.   Has followed with Dr. Amalia Hailey for many years, hx of kidney stone s/p lithotripsy, bladder sling. Has IC but didn't tolerate amitriptyline.   Surgical History:  She  has a past surgical history that includes Cholecystectomy; tubal reconstruction; Bladder repair; Tonsillectomy; Vaginal delivery; laparoscopy (1985/ 1990); tubalplasty (1985/ 1990 ); Bunionectomy; Lithotripsy; Colonoscopy; and Upper gastrointestinal endoscopy.   Past Medical History:  Diagnosis Date   Allergy    ANAL FISSURE, HX OF 09/24/2008   Qualifier: Diagnosis of  By: Tamala Julian CMA, Claiborne Billings     Anxiety    Arthritis    Fibroids    Uterine   GERD (gastroesophageal reflux disease)    HSV-2 (herpes simplex virus 2) infection    Hyperlipidemia    IBS (irritable bowel syndrome)    Interstitial cystitis    Kidney stone 2019   --Dr.Evans   Nevus    vulva nevus   Prediabetes      Allergies  Allergen Reactions   Sulfa Antibiotics Hives, Shortness Of Breath and Rash  Macrobid [Nitrofurantoin Macrocrystal] Nausea Only    Current Outpatient Medications on File Prior to Visit  Medication Sig   ALPRAZolam (XANAX) 0.25 MG tablet Take 1/2 - 1 tablet  2 - 3 x /day  ONLY if needed for Anxiety  Attack &  limit to 5 days /week to avoid Addiction & Dementia          /TAKE 1/2 TO 1 TABLET BY MOUTH AT BEDTIME AS NEEDED FOR SLEEP TRY TO LIMIT TO 5 DAYS PER WEEK TO AVOID ADDICTION AND DEMENTIA.   CALCIUM CITRATE PO Take 1,200 mg by mouth daily.   Cholecalciferol (VITAMIN D-3 PO) Take 1 capsule by mouth daily.   Cyanocobalamin (B-12 PO) Take by mouth.   estradiol (ESTRACE) 0.1 MG/GM vaginal cream 1 gram vaginally twice weekly   estradiol (ESTRACE) 0.5 MG tablet Take 1 tablet (0.5 mg total) by mouth daily. (Patient taking differently: Take 0.25 mg by mouth daily. 1/2 tablet every two days)   hyoscyamine (LEVSIN SL) 0.125 MG SL tablet One tablet three times a day as needed for abdominal pain (Patient taking differently: Take 0.125 mg by mouth as needed.)   medroxyPROGESTERone (PROVERA) 2.5 MG tablet TAKE 1 TABLET BY MOUTH EVERY DAY   omeprazole (PRILOSEC) 40 MG capsule TAKE 1 CAPSULE DAILY TO PREVENT HEARTBURN & INDIGESTION   oxybutynin (DITROPAN-XL) 10 MG 24 hr tablet Take 10 mg by mouth at bedtime.   polyethylene glycol powder (GLYCOLAX/MIRALAX) powder MIX 17 GRAMS AS DIRECTED TO TAKE BY MOUTH TWICE A DAY AS DIRECTED (Patient taking differently: MIX 17 GRAMS AS DIRECTED TO TAKE BY MOUTH AS NEEDED)   rosuvastatin (CRESTOR) 20 MG tablet Take  1 tablet  Daily  for Cholesterol   valACYclovir (VALTREX) 500 MG tablet Take 1 tablet (500 mg total) by mouth daily.   No current facility-administered medications on file prior to visit.   Allergies:  Allergies  Allergen Reactions   Sulfa Antibiotics Hives, Shortness Of Breath and Rash   Macrobid [Nitrofurantoin Macrocrystal] Nausea Only   Medical History:  has Anxiety state; Constipation; IBS (irritable bowel syndrome); Allergy; Migraines; Hyperlipidemia; Fibroids; Abnormal glucose; HSV-2 infection; Chronic interstitial cystitis; B12 deficiency; Vitamin D deficiency; Medication management; RUQ abdominal pain; Atypical chest pain; Altered bowel habits;  UTI (urinary tract infection); and Osteoporosis on their problem list. Surgical History:  She  has a past surgical history that includes Cholecystectomy; tubal reconstruction; Bladder repair; Tonsillectomy; Vaginal delivery; laparoscopy (1985/ 1990); tubalplasty (1985/ 1990 ); Bunionectomy; Lithotripsy; Colonoscopy; and Upper gastrointestinal endoscopy. Family History:  Herfamily history includes Breast cancer in her paternal grandmother; Cancer (age of onset: 55) in her sister; Celiac disease in her mother; Diabetes in her paternal grandmother; Heart disease in her father and sister; Irritable bowel syndrome in her sister; Kidney disease in an other family member; Lung cancer in her maternal grandmother; Pancreatic cancer in her maternal grandmother; Thyroid disease in her sister.  ROS: all negative except above.   Physical Exam:  LMP 01/20/2011   General Appearance: Well nourished, thin adult female in no apparent distress. Eyes: PERRL, conjunctiva no swelling or erythema ENT/Mouth: Mask in place; Hearing normal.  Neck: Supple Respiratory: Respiratory effort normal, BS equal bilaterally without rales, rhonchi, wheezing or stridor.  Cardio: RRR with no MRGs. Brisk peripheral pulses without edema.  Abdomen: Soft, + BS.  Vaguely tender throughout, no guarding, rebound, hernias, masses. Lymphatics: Non tender without lymphadenopathy.  Musculoskeletal: Full ROM, 5/5 strength, normal gait. Non-tender through groin musculature.  Skin: Warm, dry without rashes,  lesions, ecchymosis.  Neuro: Normal muscle tone Psych: Awake and oriented X 3, normal affect, Insight and Judgment appropriate.     Izora Ribas, NP 5:06 PM [facility]

## 2020-12-05 ENCOUNTER — Other Ambulatory Visit: Payer: Self-pay

## 2020-12-05 ENCOUNTER — Ambulatory Visit: Payer: 59 | Admitting: Adult Health

## 2020-12-05 ENCOUNTER — Encounter: Payer: Self-pay | Admitting: Adult Health

## 2020-12-05 VITALS — BP 112/70 | HR 64 | Temp 97.3°F | Wt 110.0 lb

## 2020-12-05 DIAGNOSIS — R350 Frequency of micturition: Secondary | ICD-10-CM

## 2020-12-05 DIAGNOSIS — R1031 Right lower quadrant pain: Secondary | ICD-10-CM

## 2020-12-05 DIAGNOSIS — K5900 Constipation, unspecified: Secondary | ICD-10-CM

## 2020-12-05 MED ORDER — MIRABEGRON ER 50 MG PO TB24: 50.0000 mg | ORAL_TABLET | Freq: Every day | ORAL | 0 refills | Status: DC

## 2020-12-06 MED ORDER — FLUCONAZOLE 150 MG PO TABS: 150.0000 mg | ORAL_TABLET | Freq: Once | ORAL | 0 refills | Status: AC

## 2020-12-06 MED ORDER — CEPHALEXIN 500 MG PO CAPS: 500.0000 mg | ORAL_CAPSULE | Freq: Two times a day (BID) | ORAL | 0 refills | Status: DC

## 2020-12-08 LAB — URINE CULTURE
MICRO NUMBER:: 12628928
SPECIMEN QUALITY:: ADEQUATE

## 2020-12-08 LAB — URINALYSIS W MICROSCOPIC + REFLEX CULTURE
Bilirubin Urine: NEGATIVE
Glucose, UA: NEGATIVE
Hgb urine dipstick: NEGATIVE
Hyaline Cast: NONE SEEN /LPF
Ketones, ur: NEGATIVE
Nitrites, Initial: POSITIVE — AB
Protein, ur: NEGATIVE
RBC / HPF: NONE SEEN /HPF (ref 0–2)
Specific Gravity, Urine: 1.008 (ref 1.001–1.035)
Squamous Epithelial / HPF: NONE SEEN /HPF (ref <=5)
WBC, UA: NONE SEEN /HPF (ref 0–5)
pH: 7.5 (ref 5.0–8.0)

## 2020-12-08 LAB — CULTURE INDICATED

## 2020-12-09 ENCOUNTER — Other Ambulatory Visit: Payer: Self-pay | Admitting: Adult Health

## 2020-12-09 ENCOUNTER — Telehealth: Payer: Self-pay

## 2020-12-09 DIAGNOSIS — N390 Urinary tract infection, site not specified: Secondary | ICD-10-CM | POA: Insufficient documentation

## 2020-12-09 DIAGNOSIS — B9689 Other specified bacterial agents as the cause of diseases classified elsewhere: Secondary | ICD-10-CM | POA: Insufficient documentation

## 2020-12-09 NOTE — Telephone Encounter (Signed)
Patient states that she talked with the Urology office and they said that they didn't get the culture that states that she is resistant to Keflex. Faxed again the results that they needed.

## 2020-12-27 ENCOUNTER — Other Ambulatory Visit: Payer: Self-pay | Admitting: Nurse Practitioner

## 2020-12-27 DIAGNOSIS — R002 Palpitations: Secondary | ICD-10-CM

## 2021-01-22 ENCOUNTER — Ambulatory Visit (HOSPITAL_BASED_OUTPATIENT_CLINIC_OR_DEPARTMENT_OTHER): Payer: 59 | Admitting: Obstetrics & Gynecology

## 2021-01-26 ENCOUNTER — Other Ambulatory Visit: Payer: Self-pay | Admitting: Obstetrics & Gynecology

## 2021-01-28 ENCOUNTER — Other Ambulatory Visit: Payer: Self-pay | Admitting: Nurse Practitioner

## 2021-01-28 DIAGNOSIS — R002 Palpitations: Secondary | ICD-10-CM

## 2021-01-28 MED ORDER — ALPRAZOLAM 0.25 MG PO TABS: ORAL_TABLET | ORAL | 0 refills | Status: DC

## 2021-01-29 ENCOUNTER — Ambulatory Visit (INDEPENDENT_AMBULATORY_CARE_PROVIDER_SITE_OTHER): Payer: 59 | Admitting: Obstetrics & Gynecology

## 2021-01-29 ENCOUNTER — Other Ambulatory Visit (HOSPITAL_COMMUNITY)
Admission: RE | Admit: 2021-01-29 | Discharge: 2021-01-29 | Disposition: A | Discharge status: Home / Self Care | Payer: 59 | Source: Ambulatory Visit | Attending: Obstetrics & Gynecology | Admitting: Obstetrics & Gynecology

## 2021-01-29 ENCOUNTER — Encounter (HOSPITAL_BASED_OUTPATIENT_CLINIC_OR_DEPARTMENT_OTHER): Payer: Self-pay | Admitting: Obstetrics & Gynecology

## 2021-01-29 ENCOUNTER — Other Ambulatory Visit: Payer: Self-pay | Admitting: Obstetrics & Gynecology

## 2021-01-29 ENCOUNTER — Other Ambulatory Visit: Payer: Self-pay

## 2021-01-29 VITALS — BP 122/70 | HR 73 | Ht 63.0 in | Wt 108.2 lb

## 2021-01-29 DIAGNOSIS — R87810 Cervical high risk human papillomavirus (HPV) DNA test positive: Secondary | ICD-10-CM | POA: Insufficient documentation

## 2021-01-29 DIAGNOSIS — N390 Urinary tract infection, site not specified: Secondary | ICD-10-CM

## 2021-01-29 DIAGNOSIS — B977 Papillomavirus as the cause of diseases classified elsewhere: Secondary | ICD-10-CM | POA: Insufficient documentation

## 2021-01-29 DIAGNOSIS — Z1151 Encounter for screening for human papillomavirus (HPV): Secondary | ICD-10-CM | POA: Diagnosis not present

## 2021-01-29 DIAGNOSIS — K219 Gastro-esophageal reflux disease without esophagitis: Secondary | ICD-10-CM

## 2021-01-29 DIAGNOSIS — Z01419 Encounter for gynecological examination (general) (routine) without abnormal findings: Secondary | ICD-10-CM

## 2021-01-29 DIAGNOSIS — B009 Herpesviral infection, unspecified: Secondary | ICD-10-CM

## 2021-01-29 DIAGNOSIS — M810 Age-related osteoporosis without current pathological fracture: Secondary | ICD-10-CM | POA: Diagnosis not present

## 2021-01-29 DIAGNOSIS — N952 Postmenopausal atrophic vaginitis: Secondary | ICD-10-CM

## 2021-01-29 DIAGNOSIS — N3010 Interstitial cystitis (chronic) without hematuria: Secondary | ICD-10-CM

## 2021-01-29 DIAGNOSIS — Z79890 Hormone replacement therapy (postmenopausal): Secondary | ICD-10-CM

## 2021-01-29 MED ORDER — VALACYCLOVIR HCL 500 MG PO TABS: 500.0000 mg | ORAL_TABLET | Freq: Every day | ORAL | 4 refills | Status: DC

## 2021-01-29 MED ORDER — ESTRADIOL 0.5 MG PO TABS: 0.2500 mg | ORAL_TABLET | Freq: Every day | ORAL | 12 refills | Status: DC

## 2021-01-29 MED ORDER — MEDROXYPROGESTERONE ACETATE 2.5 MG PO TABS: 2.5000 mg | ORAL_TABLET | Freq: Every day | ORAL | 12 refills | Status: DC

## 2021-01-29 NOTE — Patient Instructions (Signed)
Fem Dophilus -- probiotic

## 2021-01-29 NOTE — Progress Notes (Signed)
66 y.o. G2P2 Married White or Caucasian female here for annual exam.  She's had three UTIs since October.  She does have a hx of renal stones.  Has been seen by urology.  Cultures have shown e coli with resistance to ciprofloxacin.  D/w pt starting prophylaxis.    Recent BMD showed osteoporosis.  Medication options discussed.    Patient's last menstrual period was 01/20/2011.          Sexually active: Yes.    The current method of family planning is post menopausal status.    Exercising: is active Smoker:  no  Health Maintenance: Pap:  01/02/2020 Negative History of abnormal Pap:  yes MMG:  10/03/2020 Negative Colonoscopy:  07/12/2012 BMD:   10/17/2020 Osteoporosis Screening Labs: ordered today   reports that she quit smoking about 9 years ago. Her smoking use included cigarettes. She has never used smokeless tobacco. She reports current alcohol use of about 5.0 standard drinks per week. She reports that she does not use drugs.  Past Medical History:  Diagnosis Date   Allergy    ANAL FISSURE, HX OF 09/24/2008   Qualifier: Diagnosis of  By: Tamala Julian CMA, Claiborne Billings     Anxiety    Arthritis    Fibroids    Uterine   GERD (gastroesophageal reflux disease)    HSV-2 (herpes simplex virus 2) infection    Hyperlipidemia    IBS (irritable bowel syndrome)    Interstitial cystitis    Kidney stone 2019   --Dr.Evans   Nevus    vulva nevus   Prediabetes     Past Surgical History:  Procedure Laterality Date   BLADDER REPAIR     BUNIONECTOMY     CHOLECYSTECTOMY     COLONOSCOPY     LAPAROSCOPY  1985/ 1990   due to infertility   LITHOTRIPSY     TONSILLECTOMY     tubal reconstruction     tubalplasty  1985/ 1990    UPPER GASTROINTESTINAL ENDOSCOPY     VAGINAL DELIVERY     x2    Current Outpatient Medications  Medication Sig Dispense Refill   ALPRAZolam (XANAX) 0.25 MG tablet TAKE 1/2 TO 1 TABLET BY MOUTH TWO TO THREE TIMES PER DAY ONLY IF NEEDED FOR ANXIETY ATTACK AND LIMIT TO 5 DAYS  PER WEEK TO AVOID ADDICTION  DEMENTIA 25 tablet 0   CALCIUM CITRATE PO Take 1,200 mg by mouth daily.     Cholecalciferol (VITAMIN D-3 PO) Take 1 capsule by mouth daily.     Cyanocobalamin (B-12 PO) Take by mouth.     estradiol (ESTRACE) 0.5 MG tablet Take 1 tablet (0.5 mg total) by mouth daily. (Patient taking differently: Take 0.25 mg by mouth daily. 1/2 tablet every two days) 30 tablet 4   medroxyPROGESTERone (PROVERA) 2.5 MG tablet TAKE 1 TABLET BY MOUTH EVERY DAY 30 tablet 2   omeprazole (PRILOSEC) 40 MG capsule TAKE 1 CAPSULE DAILY TO PREVENT HEARTBURN & INDIGESTION 30 capsule 5   oxybutynin (DITROPAN-XL) 10 MG 24 hr tablet Take 10 mg by mouth at bedtime.     polyethylene glycol powder (GLYCOLAX/MIRALAX) powder MIX 17 GRAMS AS DIRECTED TO TAKE BY MOUTH TWICE A DAY AS DIRECTED (Patient taking differently: MIX 17 GRAMS AS DIRECTED TO TAKE BY MOUTH AS NEEDED) 255 g 3   rosuvastatin (CRESTOR) 20 MG tablet Take  1 tablet  Daily  for Cholesterol 90 tablet 3   valACYclovir (VALTREX) 500 MG tablet TAKE ONE TABLET BY MOUTH DAILY 30  tablet 0   estradiol (ESTRACE) 0.1 MG/GM vaginal cream 1 gram vaginally twice weekly (Patient not taking: Reported on 01/29/2021) 42.5 g 3   hyoscyamine (LEVSIN SL) 0.125 MG SL tablet One tablet three times a day as needed for abdominal pain (Patient not taking: Reported on 01/29/2021) 45 tablet 0   mirabegron ER (MYRBETRIQ) 50 MG TB24 tablet Take 1 tablet (50 mg total) by mouth daily. (Patient not taking: Reported on 01/29/2021) 14 tablet 0   No current facility-administered medications for this visit.    Family History  Problem Relation Age of Onset   Celiac disease Mother    Heart disease Father    Irritable bowel syndrome Sister    Thyroid disease Sister    Pancreatic cancer Maternal Grandmother    Lung cancer Maternal Grandmother    Diabetes Paternal Grandmother    Breast cancer Paternal Grandmother    Kidney disease Other        tumor   Cancer Sister 60        Melanoma   Heart disease Sister    Colon cancer Neg Hx    Esophageal cancer Neg Hx    Rectal cancer Neg Hx    Stomach cancer Neg Hx     Review of Systems  Exam:   BP 122/70 (BP Location: Left Arm, Patient Position: Sitting, Cuff Size: Normal)    Pulse 73    Ht 5\' 3"  (1.6 m) Comment: reported   Wt 108 lb 3.2 oz (49.1 kg)    LMP 01/20/2011    BMI 19.17 kg/m   Height: 5\' 3"  (160 cm) (reported)  General appearance: alert, cooperative and appears stated age Head: Normocephalic, without obvious abnormality, atraumatic Neck: no adenopathy, supple, symmetrical, trachea midline and thyroid normal to inspection and palpation Lungs: clear to auscultation bilaterally Breasts: normal appearance, no masses or tenderness Heart: regular rate and rhythm Abdomen: soft, non-tender; bowel sounds normal; no masses,  no organomegaly Extremities: extremities normal, atraumatic, no cyanosis or edema Skin: Skin color, texture, turgor normal. No rashes or lesions Lymph nodes: Cervical, supraclavicular, and axillary nodes normal. No abnormal inguinal nodes palpated Neurologic: Grossly normal   Pelvic: External genitalia:  no lesions              Urethra:  normal appearing urethra with no masses, tenderness or lesions              Bartholins and Skenes: normal                 Vagina: normal appearing vagina with normal color and no discharge, no lesions              Cervix: no lesions              Pap taken: Yes.   Bimanual Exam:  Uterus:  normal size, contour, position, consistency, mobility, non-tender              Adnexa: normal adnexa and no mass, fullness, tenderness               Rectovaginal: Confirms               Anus:  normal sphincter tone, no lesions  Chaperone, Ezekiel Ina, MD, was present for exam.  Assessment/Plan: 1. Well woman exam with routine gynecological exam - pap smear obtained - MMG 09/2020 - colonoscopy 06/2012.  Pt aware due next year. - BMD 09/2020 - lab owrk with Dr.  Idell Pickles office  2. Vaginal atrophy  3. Age-related osteoporosis without current pathological fracture - medication options discussed - PTH, intact (no Ca) - VITAMIN D 25 Hydroxy (Vit-D Deficiency, Fractures)  4. High risk HPV infection - Cytology - PAP( [facility])  5. Recurrent UTI - has follow up but knows she can call here if has symptoms  6. HSV-2 infection - valACYclovir (VALTREX) 500 MG tablet; Take 1 tablet (500 mg total) by mouth daily.  Dispense: 90 tablet; Refill: 4  7. Interstitial cystitis  - followed at [facility]  8. Gastroesophageal reflux disease without esophagitis  9. Hormone replacement therapy (HRT) - estradiol (ESTRACE) 0.5 MG tablet; Take 0.5 tablets (0.25 mg total) by mouth daily.  Dispense: 30 tablet; Refill: 12 - medroxyPROGESTERone (PROVERA) 2.5 MG tablet; Take 1 tablet (2.5 mg total) by mouth daily.  Dispense: 30 tablet; Refill: 12

## 2021-01-30 LAB — VITAMIN D 25 HYDROXY (VIT D DEFICIENCY, FRACTURES): Vit D, 25-Hydroxy: 81.7 ng/mL (ref 30.0–100.0)

## 2021-01-31 LAB — PARATHYROID HORMONE, INTACT (NO CA): PTH: 19 pg/mL (ref 15–65)

## 2021-02-01 DIAGNOSIS — M2550 Pain in unspecified joint: Secondary | ICD-10-CM | POA: Insufficient documentation

## 2021-02-03 LAB — CYTOLOGY - PAP
Comment: NEGATIVE
Comment: NEGATIVE
Diagnosis: NEGATIVE
Diagnosis: REACTIVE
HPV 16: NEGATIVE
HPV 18 / 45: NEGATIVE
High risk HPV: POSITIVE — AB

## 2021-02-10 NOTE — Progress Notes (Signed)
COMPLETE PHYSICAL  Assessment and Plan:  Encounter for general adult medical examination with abnormal findings Due Yearly  Epigastric pain  Normal endoscopy 09/14/19- continue to follow with Dr. Rosana Hoes Continue dietary modifications, medication and continue to monitor   Mixed hyperlipidemia Continue medications:rosuvastatin 10mg  Discussed dietary and exercise modifications Low fat diet       -     Lipid panel -     EKG 12-Lead   Irritable bowel syndrome with constipation Increase fiber, diet discussed Discussed metamucil Uses miralax prn  Anxiety state -continue medications: Alprazolam 0.25mg  half to one prn. , stress management techniques discussed, increase water, good sleep hygiene discussed, increase exercise, and increase veggies.   Chronic interstitial cystitis Follows with Urology Continue to monitor  Abnormal glucose Discussed disease progression and risks Discussed diet/exercise, weight management and risk modification -     Defer A1C today, weight stable, normal A1C last visit  HSV-2 infection Continue medications  Allergic state, sequela Continue OTC meds  Vitamin D deficiency .Continue supplementation to maintain goal of 70-100 Taking Vitamin D 2,000 IU daily Recent increasel -     VITAMIN D 25 Hydroxy (Vit-D Deficiency, Fractures)  B12 def  Taking 1,000mg  every other day supplementation -B12  Vasmotor symptom  Estradiol 0.5mg  half tablet, progesterone 2.5mg  half tablet - increase to daily  Medication management -     CBC with Differential/Platelet -    CMP -     Magnesium  Abnormal TSH -     TSH  Screening ischemic heart disease -EKG  Screening blood protein in Urine -UA routine with reflex microscopic - Microalbumin/creatinine urine ratio  BMI 19.52 Add on protein powder, start to work out again.   Vaginal atrophy Encouraged to take estrace and Provera daily to determine if this helps with recurrent UTI symptoms.   Begin Fem  Dophilus probiotic that was recommended by GYN  Quit smoking 9 years ago.  Discussed med's effects and SE's. Screening labs and tests as requested with regular follow-up as recommended. Future Appointments  Date Time Provider [place]  02/08/2022  2:15 PM Megan Salon, MD [facility]  02/12/2022 10:00 AM Magda Bernheim, NP [facility] None    HPI  65 y.o. female  presents for a complete physical.  Frequent UTI's, she has interstitial cystitis.  Frequency remains present over urgency.  She is taking oxybutinin which is helping with frequency and spasms.  She follows with Dr Evens Q7month.  She also had kidney stones. She was recently seen for annual GYN visit and they have started  She has had severe epigastric pressure that goes over her RUQ, will happen mainly at night, non exertional, every couple of months. Has had her gallbladder removed in the past, states it feels like that. She is on omeprazole every day. EGD 2014.  She is not on tumeric over the counter. She does wine/mixed drink 3 days a week. She is following with Dr. Loletha Carrow.       Her blood pressure has been controlled at home, today their BP is BP: 120/70.   BP Readings from Last 3 Encounters:  02/12/21 120/70  01/29/21 122/70  12/05/20 112/70   She does workout. She denies chest pain, shortness of breath, dizziness.   BMI is Body mass index is 19.52 kg/m., she is working on diet and exercise. Wt Readings from Last 3 Encounters:  02/12/21 110 lb 3.2 oz (50 kg)  01/29/21 108 lb 3.2 oz (49.1 kg)  12/05/20 110 lb (49.9  kg)    She is on cholesterol medication. She is doing 20 mg every other day. Her cholesterol is at goal. The cholesterol last visit was:  Lab Results  Component Value Date   CHOL 177 02/12/2020   HDL 72 02/12/2020   LDLCALC 89 02/12/2020   TRIG 72 02/12/2020   CHOLHDL 2.5 02/12/2020   She has been working on diet and exercise for prediabetes, she is on bASA, she is not on ACE/ARB and denies  foot ulcerations, hyperglycemia, hypoglycemia , increased appetite, nausea, paresthesia of the feet, polydipsia, polyuria, visual disturbances, vomiting and weight loss. Last A1C in the office was:  Lab Results  Component Value Date   HGBA1C 5.8 (H) 02/12/2020   Patient is on Vitamin D supplement.   Lab Results  Component Value Date   VD25OH 81.7 01/29/2021      She is very stressed, keeps her grand babies and another baby 3 days a week, 1 under 2 one is 2 and one is 64 years old. She mainly takes xanax 0.25 1/2-1 tab as needed at night, none during the day.   Current Medications:   Current Outpatient Medications (Endocrine & Metabolic):    estradiol (ESTRACE) 0.5 MG tablet, Take 0.5 tablets (0.25 mg total) by mouth daily.   medroxyPROGESTERone (PROVERA) 2.5 MG tablet, Take 1 tablet (2.5 mg total) by mouth daily.  Current Outpatient Medications (Cardiovascular):    rosuvastatin (CRESTOR) 20 MG tablet, Take  1 tablet  Daily  for Cholesterol    Current Outpatient Medications (Hematological):    Cyanocobalamin (B-12 PO), Take by mouth.  Current Outpatient Medications (Other):    ALPRAZolam (XANAX) 0.25 MG tablet, TAKE 1/2 TO 1 TABLET BY MOUTH TWO TO THREE TIMES PER DAY ONLY IF NEEDED FOR ANXIETY ATTACK AND LIMIT TO 5 DAYS PER WEEK TO AVOID ADDICTION  DEMENTIA   CALCIUM CITRATE PO, Take 1,200 mg by mouth daily.   Cholecalciferol (VITAMIN D-3 PO), Take 1 capsule by mouth daily.   omeprazole (PRILOSEC) 40 MG capsule, TAKE 1 CAPSULE DAILY TO PREVENT HEARTBURN & INDIGESTION   oxybutynin (DITROPAN-XL) 10 MG 24 hr tablet, Take 10 mg by mouth at bedtime.   polyethylene glycol powder (GLYCOLAX/MIRALAX) powder, MIX 17 GRAMS AS DIRECTED TO TAKE BY MOUTH TWICE A DAY AS DIRECTED (Patient taking differently: MIX 17 GRAMS AS DIRECTED TO TAKE BY MOUTH AS NEEDED)   valACYclovir (VALTREX) 500 MG tablet, Take 1 tablet (500 mg total) by mouth daily.   Vitamin D-Vitamin K (VITAMIN K2-VITAMIN D3 PO), Take  by mouth. 174mcg of K2, 200IU D3  Health Maintenance:   Immunization History  Administered Date(s) Administered   DTaP 01/26/2004   Influenza Inj Mdck Quad Pf 10/29/2016, 10/17/2017, 10/20/2018   Influenza,inj,Quad PF,6+ Mos 10/20/2018, 10/22/2019   Influenza-Unspecified 10/22/2015, 10/29/2016, 10/17/2017, 10/23/2020, 11/05/2020   Tdap 01/28/2014   Zoster, Live 01/26/2007   Tetanus: 2016 Flu vaccine: 11/05/20 Zostavax: 2009 Shingrix: Discussed with patient, at age 28.  Pap: 2017 Dr. Sabra Heck MGM: 10/03/20 DEXA 10/17/20, osteoporosis Colonoscopy: 2014, due 2024 Dr. Rosana Hoes Last Dental Exam:  Dr. Ennis Forts Last Eye Exam: Dr. Earl Gala Hep C: Negative  Patient Care Team: Unk Pinto, MD as PCP - General (Internal Medicine) Lafayette Dragon, MD (Inactive) as Consulting Physician (Gastroenterology)    Medical History:  Past Medical History:  Diagnosis Date   Allergy    ANAL FISSURE, HX OF 09/24/2008   Qualifier: Diagnosis of  By: Tamala Julian CMA, Claiborne Billings     Anxiety    Arthritis  Fibroids    Uterine   GERD (gastroesophageal reflux disease)    HSV-2 (herpes simplex virus 2) infection    Hyperlipidemia    IBS (irritable bowel syndrome)    Interstitial cystitis    Kidney stone 2019   --Dr.Evans   Nevus    vulva nevus   Prediabetes    Allergies Allergies  Allergen Reactions   Sulfa Antibiotics Hives, Shortness Of Breath and Rash   Macrobid [Nitrofurantoin Macrocrystal] Nausea Only    SURGICAL HISTORY She  has a past surgical history that includes Cholecystectomy; tubal reconstruction; Bladder repair; Tonsillectomy; Vaginal delivery; laparoscopy (1985/ 1990); tubalplasty (1985/ 1990 ); Bunionectomy; Lithotripsy; Colonoscopy; and Upper gastrointestinal endoscopy. FAMILY HISTORY Her family history includes Breast cancer in her paternal grandmother; Cancer (age of onset: 57) in her sister; Celiac disease in her mother; Diabetes in her paternal grandmother; Heart disease in her father  and sister; Irritable bowel syndrome in her sister; Kidney disease in an other family member; Lung cancer in her maternal grandmother; Pancreatic cancer in her maternal grandmother; Thyroid disease in her sister. SOCIAL HISTORY She  reports that she quit smoking about 9 years ago. Her smoking use included cigarettes. She has never used smokeless tobacco. She reports current alcohol use of about 5.0 standard drinks per week. She reports that she does not use drugs.  Review of Systems: Review of Systems  Constitutional:  Negative for chills and fever.  HENT:  Negative for congestion, hearing loss, sinus pain, sore throat and tinnitus.   Eyes:  Negative for blurred vision and double vision.  Respiratory:  Negative for cough, hemoptysis, sputum production, shortness of breath and wheezing.   Cardiovascular:  Negative for chest pain, palpitations and leg swelling.  Gastrointestinal:  Positive for heartburn. Negative for abdominal pain, constipation, diarrhea, nausea and vomiting.  Genitourinary:  Negative for dysuria and urgency.  Musculoskeletal:  Negative for back pain, falls, joint pain, myalgias and neck pain.  Skin:  Negative for rash.  Neurological:  Negative for dizziness, tingling, tremors, weakness and headaches.  Endo/Heme/Allergies:  Does not bruise/bleed easily.  Psychiatric/Behavioral:  Negative for depression and suicidal ideas. The patient is nervous/anxious and has insomnia.    Physical Exam: Estimated body mass index is 19.52 kg/m as calculated from the following:   Height as of this encounter: 5\' 3"  (1.6 m).   Weight as of this encounter: 110 lb 3.2 oz (50 kg). BP 120/70    Pulse 81    Temp (!) 97.3 F (36.3 C)    Ht 5\' 3"  (1.6 m)    Wt 110 lb 3.2 oz (50 kg)    LMP 01/20/2011    SpO2 98%    BMI 19.52 kg/m   General Appearance: Very pleasant thin female in no apparent distress.  Eyes: PERRLA, EOMs, conjunctiva no swelling or erythema ENT/Mouth: Ear canals normal without  obstruction, swelling, erythema, or discharge.  TMs normal bilaterally with no erythema, bulging, retraction, or loss of landmark.  Oropharynx moist and clear with no exudate, erythema, or swelling.   Neck: Supple, thyroid normal. No bruits.  No cervical adenopathy Respiratory: Respiratory effort normal, Breath sounds clear A&P without wheeze, rhonchi, rales.   Cardio: RRR without murmurs, rubs or gallops. Brisk peripheral pulses without edema.  Chest: symmetric, with normal excursions Breasts: Deferred to GYN Abdomen: Soft, nontender, no guarding, rebound, hernias, masses, or organomegaly.  Lymphatics: Non tender without lymphadenopathy.  Musculoskeletal: Full ROM all peripheral extremities,5/5 strength, and normal gait.  Skin: Warm, dry without rashes,  lesions, ecchymosis. Neuro: Awake and oriented X 3, Cranial nerves intact, reflexes equal bilaterally. Normal muscle tone, no cerebellar symptoms. Sensation intact.  Psych:  normal affect, Insight and Judgment appropriate.   EKG: NSR, no ST changes   Over 40 minutes of exam, counseling, chart review and critical decision making was performed  Patricia Ryan 3:54 PM [facility]

## 2021-02-11 ENCOUNTER — Encounter: Payer: 59 | Admitting: Adult Health Nurse Practitioner

## 2021-02-12 ENCOUNTER — Other Ambulatory Visit: Payer: Self-pay

## 2021-02-12 ENCOUNTER — Encounter: Payer: Self-pay | Admitting: Nurse Practitioner

## 2021-02-12 ENCOUNTER — Ambulatory Visit (INDEPENDENT_AMBULATORY_CARE_PROVIDER_SITE_OTHER): Payer: 59 | Admitting: Nurse Practitioner

## 2021-02-12 VITALS — BP 120/70 | HR 81 | Temp 97.3°F | Ht 63.0 in | Wt 110.2 lb

## 2021-02-12 DIAGNOSIS — E538 Deficiency of other specified B group vitamins: Secondary | ICD-10-CM

## 2021-02-12 DIAGNOSIS — B009 Herpesviral infection, unspecified: Secondary | ICD-10-CM

## 2021-02-12 DIAGNOSIS — I10 Essential (primary) hypertension: Secondary | ICD-10-CM | POA: Diagnosis not present

## 2021-02-12 DIAGNOSIS — Z1389 Encounter for screening for other disorder: Secondary | ICD-10-CM

## 2021-02-12 DIAGNOSIS — Z0001 Encounter for general adult medical examination with abnormal findings: Secondary | ICD-10-CM

## 2021-02-12 DIAGNOSIS — Z0000 Encounter for general adult medical examination without abnormal findings: Secondary | ICD-10-CM | POA: Diagnosis not present

## 2021-02-12 DIAGNOSIS — R7989 Other specified abnormal findings of blood chemistry: Secondary | ICD-10-CM

## 2021-02-12 DIAGNOSIS — R1013 Epigastric pain: Secondary | ICD-10-CM

## 2021-02-12 DIAGNOSIS — Z136 Encounter for screening for cardiovascular disorders: Secondary | ICD-10-CM

## 2021-02-12 DIAGNOSIS — E782 Mixed hyperlipidemia: Secondary | ICD-10-CM

## 2021-02-12 DIAGNOSIS — F411 Generalized anxiety disorder: Secondary | ICD-10-CM

## 2021-02-12 DIAGNOSIS — J309 Allergic rhinitis, unspecified: Secondary | ICD-10-CM

## 2021-02-12 DIAGNOSIS — R7309 Other abnormal glucose: Secondary | ICD-10-CM

## 2021-02-12 DIAGNOSIS — K581 Irritable bowel syndrome with constipation: Secondary | ICD-10-CM

## 2021-02-12 DIAGNOSIS — Z79899 Other long term (current) drug therapy: Secondary | ICD-10-CM

## 2021-02-12 DIAGNOSIS — N952 Postmenopausal atrophic vaginitis: Secondary | ICD-10-CM

## 2021-02-12 DIAGNOSIS — N3010 Interstitial cystitis (chronic) without hematuria: Secondary | ICD-10-CM

## 2021-02-12 DIAGNOSIS — Z681 Body mass index (BMI) 19 or less, adult: Secondary | ICD-10-CM

## 2021-02-12 DIAGNOSIS — N951 Menopausal and female climacteric states: Secondary | ICD-10-CM

## 2021-02-12 DIAGNOSIS — E559 Vitamin D deficiency, unspecified: Secondary | ICD-10-CM

## 2021-02-12 NOTE — Patient Instructions (Signed)

## 2021-02-13 ENCOUNTER — Encounter: Payer: Self-pay | Admitting: Nurse Practitioner

## 2021-02-13 LAB — COMPLETE METABOLIC PANEL WITH GFR
AG Ratio: 2.3 (calc) (ref 1.0–2.5)
ALT: 19 U/L (ref 6–29)
AST: 18 U/L (ref 10–35)
Albumin: 4.6 g/dL (ref 3.6–5.1)
Alkaline phosphatase (APISO): 54 U/L (ref 37–153)
BUN: 11 mg/dL (ref 7–25)
CO2: 33 mmol/L — ABNORMAL HIGH (ref 20–32)
Calcium: 9.9 mg/dL (ref 8.6–10.4)
Chloride: 102 mmol/L (ref 98–110)
Creat: 0.56 mg/dL (ref 0.50–1.05)
Globulin: 2 g/dL (calc) (ref 1.9–3.7)
Glucose, Bld: 96 mg/dL (ref 65–99)
Potassium: 4.2 mmol/L (ref 3.5–5.3)
Sodium: 140 mmol/L (ref 135–146)
Total Bilirubin: 0.5 mg/dL (ref 0.2–1.2)
Total Protein: 6.6 g/dL (ref 6.1–8.1)
eGFR: 102 mL/min/{1.73_m2} (ref >=60)

## 2021-02-13 LAB — CBC WITH DIFFERENTIAL/PLATELET
Absolute Monocytes: 567 cells/uL (ref 200–950)
Basophils Absolute: 28 cells/uL (ref 0–200)
Basophils Relative: 0.4 %
Eosinophils Absolute: 42 cells/uL (ref 15–500)
Eosinophils Relative: 0.6 %
HCT: 42.2 % (ref 35.0–45.0)
Hemoglobin: 14.1 g/dL (ref 11.7–15.5)
Lymphs Abs: 1267 cells/uL (ref 850–3900)
MCH: 30.8 pg (ref 27.0–33.0)
MCHC: 33.4 g/dL (ref 32.0–36.0)
MCV: 92.1 fL (ref 80.0–100.0)
MPV: 9.7 fL (ref 7.5–12.5)
Monocytes Relative: 8.1 %
Neutro Abs: 5096 cells/uL (ref 1500–7800)
Neutrophils Relative %: 72.8 %
Platelets: 271 10*3/uL (ref 140–400)
RBC: 4.58 10*6/uL (ref 3.80–5.10)
RDW: 12.6 % (ref 11.0–15.0)
Total Lymphocyte: 18.1 %
WBC: 7 10*3/uL (ref 3.8–10.8)

## 2021-02-13 LAB — HEMOGLOBIN A1C
Hgb A1c MFr Bld: 5.8 % of total Hgb — ABNORMAL HIGH (ref <5.7)
Mean Plasma Glucose: 120 mg/dL
eAG (mmol/L): 6.6 mmol/L

## 2021-02-13 LAB — VITAMIN D 25 HYDROXY (VIT D DEFICIENCY, FRACTURES): Vit D, 25-Hydroxy: 69 ng/mL (ref 30–100)

## 2021-02-13 LAB — URINALYSIS, ROUTINE W REFLEX MICROSCOPIC
Bilirubin Urine: NEGATIVE
Glucose, UA: NEGATIVE
Hgb urine dipstick: NEGATIVE
Ketones, ur: NEGATIVE
Leukocytes,Ua: NEGATIVE
Nitrite: NEGATIVE
Protein, ur: NEGATIVE
Specific Gravity, Urine: 1.006 (ref 1.001–1.035)
pH: 7.5 (ref 5.0–8.0)

## 2021-02-13 LAB — LIPID PANEL
Cholesterol: 189 mg/dL (ref <200)
HDL: 75 mg/dL (ref >=50)
LDL Cholesterol (Calc): 98 mg/dL (calc)
Non-HDL Cholesterol (Calc): 114 mg/dL (calc) (ref <130)
Total CHOL/HDL Ratio: 2.5 (calc) (ref <5.0)
Triglycerides: 72 mg/dL (ref <150)

## 2021-02-13 LAB — MAGNESIUM: Magnesium: 2.1 mg/dL (ref 1.5–2.5)

## 2021-02-13 LAB — VITAMIN B12: Vitamin B-12: 893 pg/mL (ref 200–1100)

## 2021-02-13 LAB — TSH: TSH: 0.9 mIU/L (ref 0.40–4.50)

## 2021-02-13 LAB — MICROALBUMIN / CREATININE URINE RATIO
Creatinine, Urine: 19 mg/dL — ABNORMAL LOW (ref 20–275)
Microalb, Ur: <0.2 mg/dL

## 2021-02-26 ENCOUNTER — Other Ambulatory Visit: Payer: Self-pay | Admitting: Nurse Practitioner

## 2021-02-26 DIAGNOSIS — R002 Palpitations: Secondary | ICD-10-CM

## 2021-03-21 ENCOUNTER — Other Ambulatory Visit: Payer: Self-pay | Admitting: Nurse Practitioner

## 2021-03-24 ENCOUNTER — Encounter: Payer: Self-pay | Admitting: Nurse Practitioner

## 2021-03-30 ENCOUNTER — Other Ambulatory Visit: Payer: Self-pay | Admitting: Nurse Practitioner

## 2021-03-30 DIAGNOSIS — R002 Palpitations: Secondary | ICD-10-CM

## 2021-04-13 ENCOUNTER — Other Ambulatory Visit: Payer: Self-pay

## 2021-04-13 ENCOUNTER — Encounter: Payer: Self-pay | Admitting: Nurse Practitioner

## 2021-04-13 ENCOUNTER — Ambulatory Visit: Payer: 59 | Admitting: Nurse Practitioner

## 2021-04-13 VITALS — BP 140/70 | HR 64 | Temp 97.3°F | Wt 109.6 lb

## 2021-04-13 DIAGNOSIS — R300 Dysuria: Secondary | ICD-10-CM | POA: Diagnosis not present

## 2021-04-13 DIAGNOSIS — H6982 Other specified disorders of Eustachian tube, left ear: Secondary | ICD-10-CM | POA: Diagnosis not present

## 2021-04-13 MED ORDER — AMOXICILLIN-POT CLAVULANATE 875-125 MG PO TABS: 1.0000 | ORAL_TABLET | Freq: Two times a day (BID) | ORAL | 0 refills | Status: AC

## 2021-04-13 NOTE — Progress Notes (Signed)
Assessment and Plan: ? ?Ellianna was seen today for acute visit. ? ?Diagnoses and all orders for this visit: ? ?Dysuria ?-     Urinalysis, Routine w reflex microscopic ?-     Urine Culture ?-     amoxicillin-clavulanate (AUGMENTIN) 875-125 MG tablet; Take 1 tablet by mouth 2 (two) times daily for 7 days. ?Push fluids ?If develop fever, increased pain, nausea, vomiting notify the office ? ?Dysfunction of left eustachian tube ?Fluid noted left inner ear ?Begin Mucinex and Zyrtec daily ?Push fluids ?  ? ? ? ?Further disposition pending results of labs. Discussed med's effects and SE's.   ?Over 30 minutes of exam, counseling, chart review, and critical decision making was performed.  ? ?Future Appointments  ?Date Time Provider [place]  ?08/13/2021 11:00 AM Magda Bernheim, NP [facility] None  ?02/08/2022  2:15 PM Megan Salon, MD [facility]  ?02/12/2022 10:00 AM Magda Bernheim, NP [facility] None  ? ? ?------------------------------------------------------------------------------------------------------------------ ? ? ?HPI ?BP 140/70   Pulse 64   Temp (!) 97.3 ?F (36.3 ?C)   Wt 109 lb 9.6 oz (49.7 kg)   LMP 01/20/2011   SpO2 98%   BMI 19.41 kg/m?  ? ?66 y.o.female presents for bladder spasms which started 2 days ago . She will get a burning sensation at end of urination. She has a history of interstitial cystitis on Ditropan with some relief. She did take AZO this morning ? ?When bending over will get pressure in her left ear.  Just finished antibiotics for sinus infection and bronchitis.  ?  ?She is not on any BP medication, BP has been controlled. ?BP Readings from Last 3 Encounters:  ?04/13/21 140/70  ?02/12/21 120/70  ?01/29/21 122/70  ? ? ? ? ?Past Medical History:  ?Diagnosis Date  ? Allergy   ? ANAL FISSURE, HX OF 09/24/2008  ? Qualifier: Diagnosis of  By: Tamala Julian CMA, Claiborne Billings    ? Anxiety   ? Arthritis   ? Fibroids   ? Uterine  ? GERD (gastroesophageal reflux disease)   ? HSV-2 (herpes simplex virus 2)  infection   ? Hyperlipidemia   ? IBS (irritable bowel syndrome)   ? Interstitial cystitis   ? Kidney stone 2019  ? --Dr.Evans  ? Nevus   ? vulva nevus  ? Prediabetes   ?  ? ?Allergies  ?Allergen Reactions  ? Sulfa Antibiotics Hives, Shortness Of Breath and Rash  ? Macrobid [Nitrofurantoin Macrocrystal] Nausea Only  ? ? ?Current Outpatient Medications on File Prior to Visit  ?Medication Sig  ? ALPRAZolam (XANAX) 0.25 MG tablet TAKE 1/2 TO 1 TABLET BY MOUTH 2 TO 3 TIMES PER DAY ONLY IF NEEDED FOR ANXIETY ATTACK AND LIMIT TO 5 DAYS PER WEEK TO AVOID ADDICTON OR DEMENTIA  ? CALCIUM CITRATE PO Take 1,200 mg by mouth daily.  ? Cholecalciferol (VITAMIN D-3 PO) Take 1 capsule by mouth daily.  ? Cranberry-Vitamin C-Probiotic (AZO CRANBERRY PO) Take by mouth.  ? Cyanocobalamin (B-12 PO) Take by mouth.  ? estradiol (ESTRACE) 0.5 MG tablet Take 0.5 tablets (0.25 mg total) by mouth daily.  ? medroxyPROGESTERone (PROVERA) 2.5 MG tablet Take 1 tablet (2.5 mg total) by mouth daily.  ? omeprazole (PRILOSEC) 40 MG capsule TAKE 1 CAPSULE DAILY TO PREVENT HEARTBURN & INDIGESTION  ? oxybutynin (DITROPAN-XL) 10 MG 24 hr tablet Take 10 mg by mouth at bedtime.  ? polyethylene glycol powder (GLYCOLAX/MIRALAX) powder MIX 17 GRAMS AS DIRECTED TO TAKE BY MOUTH TWICE A DAY  AS DIRECTED (Patient taking differently: MIX 17 GRAMS AS DIRECTED TO TAKE BY MOUTH AS NEEDED)  ? rosuvastatin (CRESTOR) 20 MG tablet Take  1 tablet  Daily  for Cholesterol  ? valACYclovir (VALTREX) 500 MG tablet Take 1 tablet (500 mg total) by mouth daily.  ? Vitamin D-Vitamin K (VITAMIN K2-VITAMIN D3 PO) Take by mouth. 171mg of K2, 200IU D3  ? ?No current facility-administered medications on file prior to visit.  ? ? ?ROS: all negative except above.  ? ?Physical Exam: ? ?BP 140/70   Pulse 64   Temp (!) 97.3 ?F (36.3 ?C)   Wt 109 lb 9.6 oz (49.7 kg)   LMP 01/20/2011   SpO2 98%   BMI 19.41 kg/m?  ? ?General Appearance: Well nourished, in no apparent distress. ?Eyes:  PERRLA, EOMs, conjunctiva no swelling or erythema ?Sinuses: No Frontal/maxillary tenderness ?ENT/Mouth: Ext aud canals clear, TMs without erythema, bulging. L TM has fluid noted. No erythema, swelling, or exudate on post pharynx.  Tonsils not swollen or erythematous. Hearing normal.  ?Neck: Supple, thyroid normal.  ?Respiratory: Respiratory effort normal, BS equal bilaterally without rales, rhonchi, wheezing or stridor.  ?Cardio: RRR with no MRGs. Brisk peripheral pulses without edema.  ?Abdomen: Soft, + BS.  Non tender, no guarding, rebound, hernias, masses. ?Lymphatics: Non tender without lymphadenopathy.  ?Musculoskeletal: Full ROM, 5/5 strength, normal gait. No CVA tenderness ?Skin: Warm, dry without rashes, lesions, ecchymosis.  ?Neuro: Cranial nerves intact. Normal muscle tone, no cerebellar symptoms. Sensation intact.  ?Psych: Awake and oriented X 3, normal affect, Insight and Judgment appropriate.  ?  ? ?DMagda Bernheim NP ?4:33 PM ?[facility] ? ?

## 2021-04-14 ENCOUNTER — Encounter: Payer: Self-pay | Admitting: Nurse Practitioner

## 2021-04-15 LAB — URINALYSIS, ROUTINE W REFLEX MICROSCOPIC
Bilirubin Urine: NEGATIVE
Glucose, UA: NEGATIVE
Hgb urine dipstick: NEGATIVE
Hyaline Cast: NONE SEEN /LPF
Ketones, ur: NEGATIVE
Leukocytes,Ua: NEGATIVE
Nitrite: POSITIVE — AB
Protein, ur: NEGATIVE
Specific Gravity, Urine: 1.006 (ref 1.001–1.035)
Squamous Epithelial / HPF: NONE SEEN /HPF (ref <=5)
pH: 7 (ref 5.0–8.0)

## 2021-04-15 LAB — URINE CULTURE
MICRO NUMBER:: 13152975
SPECIMEN QUALITY:: ADEQUATE

## 2021-04-30 ENCOUNTER — Encounter (HOSPITAL_BASED_OUTPATIENT_CLINIC_OR_DEPARTMENT_OTHER): Payer: Self-pay | Admitting: *Deleted

## 2021-04-30 ENCOUNTER — Other Ambulatory Visit: Payer: Self-pay | Admitting: Nurse Practitioner

## 2021-04-30 DIAGNOSIS — R002 Palpitations: Secondary | ICD-10-CM

## 2021-05-09 ENCOUNTER — Other Ambulatory Visit: Payer: Self-pay | Admitting: Internal Medicine

## 2021-05-09 DIAGNOSIS — E782 Mixed hyperlipidemia: Secondary | ICD-10-CM

## 2021-05-09 MED ORDER — ROSUVASTATIN CALCIUM 20 MG PO TABS: ORAL_TABLET | ORAL | 3 refills | Status: DC

## 2021-05-27 ENCOUNTER — Other Ambulatory Visit: Payer: Self-pay | Admitting: Nurse Practitioner

## 2021-05-27 DIAGNOSIS — R002 Palpitations: Secondary | ICD-10-CM

## 2021-06-08 ENCOUNTER — Ambulatory Visit: Payer: 59 | Admitting: Nurse Practitioner

## 2021-06-08 ENCOUNTER — Encounter: Payer: Self-pay | Admitting: Nurse Practitioner

## 2021-06-08 VITALS — BP 112/60 | HR 62 | Temp 97.7°F | Wt 105.2 lb

## 2021-06-08 DIAGNOSIS — K2900 Acute gastritis without bleeding: Secondary | ICD-10-CM | POA: Diagnosis not present

## 2021-06-08 LAB — COMPLETE METABOLIC PANEL WITH GFR
AG Ratio: 2 (calc) (ref 1.0–2.5)
ALT: 258 U/L — ABNORMAL HIGH (ref 6–29)
AST: 49 U/L — ABNORMAL HIGH (ref 10–35)
Albumin: 4.3 g/dL (ref 3.6–5.1)
Alkaline phosphatase (APISO): 139 U/L (ref 37–153)
BUN: 10 mg/dL (ref 7–25)
CO2: 31 mmol/L (ref 20–32)
Calcium: 9.5 mg/dL (ref 8.6–10.4)
Chloride: 106 mmol/L (ref 98–110)
Creat: 0.66 mg/dL (ref 0.50–1.05)
Globulin: 2.1 g/dL (calc) (ref 1.9–3.7)
Glucose, Bld: 106 mg/dL — ABNORMAL HIGH (ref 65–99)
Potassium: 5.2 mmol/L (ref 3.5–5.3)
Sodium: 143 mmol/L (ref 135–146)
Total Bilirubin: 0.5 mg/dL (ref 0.2–1.2)
Total Protein: 6.4 g/dL (ref 6.1–8.1)
eGFR: 98 mL/min/{1.73_m2} (ref >=60)

## 2021-06-08 LAB — CBC WITH DIFFERENTIAL/PLATELET
Absolute Monocytes: 731 cells/uL (ref 200–950)
Basophils Absolute: 32 cells/uL (ref 0–200)
Basophils Relative: 0.5 %
Eosinophils Absolute: 69 cells/uL (ref 15–500)
Eosinophils Relative: 1.1 %
HCT: 42.4 % (ref 35.0–45.0)
Hemoglobin: 14.3 g/dL (ref 11.7–15.5)
Lymphs Abs: 1222 cells/uL (ref 850–3900)
MCH: 31 pg (ref 27.0–33.0)
MCHC: 33.7 g/dL (ref 32.0–36.0)
MCV: 92 fL (ref 80.0–100.0)
MPV: 10 fL (ref 7.5–12.5)
Monocytes Relative: 11.6 %
Neutro Abs: 4246 cells/uL (ref 1500–7800)
Neutrophils Relative %: 67.4 %
Platelets: 244 10*3/uL (ref 140–400)
RBC: 4.61 10*6/uL (ref 3.80–5.10)
RDW: 11.9 % (ref 11.0–15.0)
Total Lymphocyte: 19.4 %
WBC: 6.3 10*3/uL (ref 3.8–10.8)

## 2021-06-08 MED ORDER — SUCRALFATE 1 G PO TABS: 1.0000 g | ORAL_TABLET | Freq: Four times a day (QID) | ORAL | 0 refills | Status: DC

## 2021-06-08 MED ORDER — HYOSCYAMINE SULFATE 0.125 MG PO TABS: 0.1250 mg | ORAL_TABLET | ORAL | 2 refills | Status: AC | PRN

## 2021-06-08 MED ORDER — ESOMEPRAZOLE MAGNESIUM 40 MG PO CPDR: 40.0000 mg | DELAYED_RELEASE_CAPSULE | Freq: Two times a day (BID) | ORAL | 0 refills | Status: DC

## 2021-06-08 NOTE — Progress Notes (Signed)
Assessment and Plan: ?Jessamy was seen today for acute visit. ? ?Diagnoses and all orders for this visit: ? ?Acute superficial gastritis without hemorrhage ?Monitor symptoms, if develop fever or diarrhea begins notify the office ?If no improvement in 1 week call office and will refer to GI ?-     CBC with Differential/Platelet ?-     COMPLETE METABOLIC PANEL WITH GFR ?-     sucralfate (CARAFATE) 1 g tablet; Take 1 tablet (1 g total) by mouth 4 (four) times daily. ?-     esomeprazole (NEXIUM) 40 MG capsule; Take 1 capsule (40 mg total) by mouth 2 (two) times daily before a meal. ?-     hyoscyamine (LEVSIN) 0.125 MG tablet; Take 1 tablet (0.125 mg total) by mouth every 4 (four) hours as needed for cramping (diarrhea, nausea). ? ?  ? ? ? ?Further disposition pending results of labs. Discussed med's effects and SE's.   ?Over 30 minutes of exam, counseling, chart review, and critical decision making was performed.  ? ?Future Appointments  ?Date Time Provider [place]  ?08/13/2021 11:00 AM Magda Bernheim, NP [facility] None  ?02/08/2022  2:15 PM Megan Salon, MD [facility]  ?02/12/2022 10:00 AM Magda Bernheim, NP [facility] None  ? ? ?------------------------------------------------------------------------------------------------------------------ ? ? ?HPI ?BP 112/60   Pulse 62   Temp 97.7 ?F (36.5 ?C)   Wt 105 lb 3.2 oz (47.7 kg)   LMP 01/20/2011   SpO2 98%   BMI 18.64 kg/m?  ? ?66 y.o.female presents for diarrhea and stomach pain associated with nausea. Was in [place] for a week from 05/30/21 to 06/06/21 Started symptoms 5 days ago.  Symptoms started with epigastric stomach pain that was sharp.  Diarrhea only occurred x 1 and resolved after using Immodium. She has associated nausea, no emesis. ? ?Past Medical History:  ?Diagnosis Date  ? Allergy   ? ANAL FISSURE, HX OF 09/24/2008  ? Qualifier: Diagnosis of  By: Tamala Julian CMA, Claiborne Billings    ? Anxiety   ? Arthritis   ? Fibroids   ? Uterine  ? GERD (gastroesophageal reflux  disease)   ? HSV-2 (herpes simplex virus 2) infection   ? Hyperlipidemia   ? IBS (irritable bowel syndrome)   ? Interstitial cystitis   ? Kidney stone 2019  ? --Dr.Evans  ? Nevus   ? vulva nevus  ? Prediabetes   ?  ? ?Allergies  ?Allergen Reactions  ? Sulfa Antibiotics Hives, Shortness Of Breath and Rash  ? Macrobid [Nitrofurantoin Macrocrystal] Nausea Only  ? ? ?Current Outpatient Medications on File Prior to Visit  ?Medication Sig  ? ALPRAZolam (XANAX) 0.25 MG tablet TAKE 1/2 TO 1 TABLET BY MOUTH 2 TO 3 TIMES A DAY ONLY IF NEEDED FOR ANXIETY ATTACK AND LIMIT TO 5 DAYS PER WEEK TO AVOID ADDICTION OR DEMENTIA  ? CALCIUM CITRATE PO Take 1,200 mg by mouth daily.  ? Cholecalciferol (VITAMIN D-3 PO) Take 1 capsule by mouth daily.  ? Cranberry-Vitamin C-Probiotic (AZO CRANBERRY PO) Take by mouth. PRN  ? Cyanocobalamin (B-12 PO) Take by mouth.  ? estradiol (ESTRACE) 0.5 MG tablet Take 0.5 tablets (0.25 mg total) by mouth daily.  ? medroxyPROGESTERone (PROVERA) 2.5 MG tablet Take 1 tablet (2.5 mg total) by mouth daily.  ? omeprazole (PRILOSEC) 40 MG capsule TAKE 1 CAPSULE DAILY TO PREVENT HEARTBURN & INDIGESTION  ? oxybutynin (DITROPAN-XL) 10 MG 24 hr tablet Take 10 mg by mouth at bedtime.  ? polyethylene glycol powder (GLYCOLAX/MIRALAX) powder  MIX 17 GRAMS AS DIRECTED TO TAKE BY MOUTH TWICE A DAY AS DIRECTED (Patient taking differently: MIX 17 GRAMS AS DIRECTED TO TAKE BY MOUTH AS NEEDED)  ? rosuvastatin (CRESTOR) 20 MG tablet Take  1 tablet  Daily  for Cholesterol  ? valACYclovir (VALTREX) 500 MG tablet Take 1 tablet (500 mg total) by mouth daily.  ? Vitamin D-Vitamin K (VITAMIN K2-VITAMIN D3 PO) Take by mouth. 121mg of K2, 200IU D3 (Patient not taking: Reported on 06/08/2021)  ? ?No current facility-administered medications on file prior to visit.  ? ? ?ROS: all negative except above.  ? ?Physical Exam: ? ?BP 112/60   Pulse 62   Temp 97.7 ?F (36.5 ?C)   Wt 105 lb 3.2 oz (47.7 kg)   LMP 01/20/2011   SpO2 98%   BMI  18.64 kg/m?  ? ?General Appearance: Well nourished, in no apparent distress. ?Eyes: PERRLA, EOMs, conjunctiva no swelling or erythema ?Sinuses: No Frontal/maxillary tenderness ?ENT/Mouth: Ext aud canals clear, TMs without erythema, bulging. No erythema, swelling, or exudate on post pharynx.  Tonsils not swollen or erythematous. Hearing normal.  ?Neck: Supple, thyroid normal.  ?Respiratory: Respiratory effort normal, BS equal bilaterally without rales, rhonchi, wheezing or stridor.  ?Cardio: RRR with no MRGs. Brisk peripheral pulses without edema.  ?Abdomen: Soft, + BS. Tender in epigastric area. ?Lymphatics: Non tender without lymphadenopathy.  ?Musculoskeletal: Full ROM, 5/5 strength, normal gait.  ?Skin: Warm, dry without rashes, lesions, ecchymosis.  ?Neuro: Cranial nerves intact. Normal muscle tone, no cerebellar symptoms. Sensation intact.  ?Psych: Awake and oriented X 3, normal affect, Insight and Judgment appropriate.  ?  ? ?DMagda Bernheim NP ?11:27 AM ?[facility] ? ?

## 2021-06-09 ENCOUNTER — Encounter: Payer: Self-pay | Admitting: Nurse Practitioner

## 2021-06-09 ENCOUNTER — Other Ambulatory Visit: Payer: Self-pay | Admitting: Nurse Practitioner

## 2021-06-09 DIAGNOSIS — R7989 Other specified abnormal findings of blood chemistry: Secondary | ICD-10-CM

## 2021-06-12 ENCOUNTER — Ambulatory Visit
Admission: RE | Admit: 2021-06-12 | Discharge: 2021-06-12 | Disposition: A | Discharge status: Home / Self Care | Payer: 59 | Source: Ambulatory Visit | Attending: Nurse Practitioner | Admitting: Nurse Practitioner

## 2021-06-12 DIAGNOSIS — R7989 Other specified abnormal findings of blood chemistry: Secondary | ICD-10-CM

## 2021-06-15 ENCOUNTER — Encounter: Payer: Self-pay | Admitting: Nurse Practitioner

## 2021-06-15 ENCOUNTER — Ambulatory Visit: Payer: 59 | Admitting: Nurse Practitioner

## 2021-06-15 VITALS — BP 110/68 | HR 69 | Temp 97.9°F | Wt 106.2 lb

## 2021-06-15 DIAGNOSIS — R7989 Other specified abnormal findings of blood chemistry: Secondary | ICD-10-CM

## 2021-06-15 DIAGNOSIS — K769 Liver disease, unspecified: Secondary | ICD-10-CM | POA: Diagnosis not present

## 2021-06-15 DIAGNOSIS — R1031 Right lower quadrant pain: Secondary | ICD-10-CM | POA: Diagnosis not present

## 2021-06-15 NOTE — Progress Notes (Signed)
Assessment and Plan:  Patricia Ryan was seen today for follow-up.  Diagnoses and all orders for this visit:  Elevated LFTs/Liver disorder Ultrasound showed fatty liver vs. Hepatocellular disease.  [place] -     Hepatitis C RNA quantitative -     ANA -     Hepatitis A antibody, total -     Hepatitis B core antibody, total -     Hepatitis B e antibody -     Hepatitis C antibody -     Anti-smooth muscle antibody, IgG -     Epstein-Barr virus VCA antibody panel -     Hepatitis A Antibody, IGM -     Hepatitis B surface antibody -     COMPLETE METABOLIC PANEL WITH GFR   RLQ Abdominal Pain Continue to monitor. If all labs are normal and pain persists will order MRCP to evaluate dilated bile duct    Further disposition pending results of labs. Discussed med's effects and SE's.   Over 30 minutes of exam, counseling, chart review, and critical decision making was performed.   Future Appointments  Date Time Provider [place]  08/13/2021 11:00 AM Magda Bernheim, NP [facility] None  02/08/2022  2:15 PM Megan Salon, MD [facility]  02/12/2022 10:00 AM Magda Bernheim, NP [facility] None    ------------------------------------------------------------------------------------------------------------------   HPI BP 110/68   Pulse 69   Temp 97.9 F (36.6 C)   Wt 106 lb 3.2 oz (48.2 kg)   LMP 01/20/2011   SpO2 98%   BMI 18.81 kg/m   66 y.o.female presents for evaluation of elevated LFT's with ultrasound that showed: 1. Increased echogenicity of the liver parenchyma which may represent hepatic steatosis and/or other hepatocellular disease. 2. Mild biliary ductal dilatation which may be compensatory from cholecystectomy. Correlate clinically and consider MRCP if indicated. 3. Small left renal cysts.  She continues to have some upper right quadrant abdominal pain.  No further diarrhea. Denies nausea and vomiting.   BMI is Body mass index is 18.81 kg/m.,  she has not been working on diet and exercise. Wt Readings from Last 3 Encounters:  06/15/21 106 lb 3.2 oz (48.2 kg)  06/08/21 105 lb 3.2 oz (47.7 kg)  04/13/21 109 lb 9.6 oz (49.7 kg)     Past Medical History:  Diagnosis Date   Allergy    ANAL FISSURE, HX OF 09/24/2008   Qualifier: Diagnosis of  By: Tamala Julian CMA, Claiborne Billings     Anxiety    Arthritis    Fibroids    Uterine   GERD (gastroesophageal reflux disease)    HSV-2 (herpes simplex virus 2) infection    Hyperlipidemia    IBS (irritable bowel syndrome)    Interstitial cystitis    Kidney stone 2019   --Dr.Evans   Nevus    vulva nevus   Prediabetes      Allergies  Allergen Reactions   Sulfa Antibiotics Hives, Shortness Of Breath and Rash   Macrobid [Nitrofurantoin Macrocrystal] Nausea Only    Current Outpatient Medications on File Prior to Visit  Medication Sig   ALPRAZolam (XANAX) 0.25 MG tablet TAKE 1/2 TO 1 TABLET BY MOUTH 2 TO 3 TIMES A DAY ONLY IF NEEDED FOR ANXIETY ATTACK AND LIMIT TO 5 DAYS PER WEEK TO AVOID ADDICTION OR DEMENTIA   CALCIUM CITRATE PO Take 1,200 mg by mouth daily.   Cholecalciferol (VITAMIN D-3 PO) Take 1 capsule by mouth daily.   Cyanocobalamin (B-12 PO) Take by  mouth.   esomeprazole (NEXIUM) 40 MG capsule Take 1 capsule (40 mg total) by mouth 2 (two) times daily before a meal.   estradiol (ESTRACE) 0.5 MG tablet Take 0.5 tablets (0.25 mg total) by mouth daily.   hyoscyamine (LEVSIN) 0.125 MG tablet Take 1 tablet (0.125 mg total) by mouth every 4 (four) hours as needed for cramping (diarrhea, nausea).   medroxyPROGESTERone (PROVERA) 2.5 MG tablet Take 1 tablet (2.5 mg total) by mouth daily.   oxybutynin (DITROPAN-XL) 10 MG 24 hr tablet Take 10 mg by mouth at bedtime.   rosuvastatin (CRESTOR) 20 MG tablet Take  1 tablet  Daily  for Cholesterol   sucralfate (CARAFATE) 1 g tablet Take 1 tablet (1 g total) by mouth 4 (four) times daily.   valACYclovir (VALTREX) 500 MG tablet Take 1 tablet (500 mg total) by  mouth daily.   Cranberry-Vitamin C-Probiotic (AZO CRANBERRY PO) Take by mouth. PRN (Patient not taking: Reported on 06/15/2021)   No current facility-administered medications on file prior to visit.    ROS: all negative except above.   Physical Exam:  BP 110/68   Pulse 69   Temp 97.9 F (36.6 C)   Wt 106 lb 3.2 oz (48.2 kg)   LMP 01/20/2011   SpO2 98%   BMI 18.81 kg/m   General Appearance: Well nourished, in no apparent distress. Eyes: PERRLA, EOMs, conjunctiva no swelling or erythema Sinuses: No Frontal/maxillary tenderness ENT/Mouth: Ext aud canals clear, TMs without erythema, bulging. No erythema, swelling, or exudate on post pharynx.  Tonsils not swollen or erythematous. Hearing normal.  Neck: Supple, thyroid normal.  Respiratory: Respiratory effort normal, BS equal bilaterally without rales, rhonchi, wheezing or stridor.  Cardio: RRR with no MRGs. Brisk peripheral pulses without edema.  Abdomen: Soft, + BS.  Mild tenderness right lower quadrant Lymphatics: Non tender without lymphadenopathy.  Musculoskeletal: Full ROM, 5/5 strength, normal gait.  Skin: Warm, dry without rashes, lesions, ecchymosis.  Neuro: Cranial nerves intact. Normal muscle tone, no cerebellar symptoms. Sensation intact.  Psych: Awake and oriented X 3, normal affect, Insight and Judgment appropriate.     Magda Bernheim, NP 10:11 AM [facility]

## 2021-06-16 ENCOUNTER — Encounter: Payer: Self-pay | Admitting: Nurse Practitioner

## 2021-06-17 ENCOUNTER — Encounter: Payer: Self-pay | Admitting: Nurse Practitioner

## 2021-06-18 ENCOUNTER — Encounter: Payer: Self-pay | Admitting: Nurse Practitioner

## 2021-06-19 ENCOUNTER — Encounter: Payer: Self-pay | Admitting: Nurse Practitioner

## 2021-06-22 LAB — COMPLETE METABOLIC PANEL WITH GFR
AG Ratio: 2 (calc) (ref 1.0–2.5)
ALT: 42 U/L — ABNORMAL HIGH (ref 6–29)
AST: 17 U/L (ref 10–35)
Albumin: 4.2 g/dL (ref 3.6–5.1)
Alkaline phosphatase (APISO): 68 U/L (ref 37–153)
BUN: 8 mg/dL (ref 7–25)
CO2: 27 mmol/L (ref 20–32)
Calcium: 9.4 mg/dL (ref 8.6–10.4)
Chloride: 108 mmol/L (ref 98–110)
Creat: 0.57 mg/dL (ref 0.50–1.05)
Globulin: 2.1 g/dL (calc) (ref 1.9–3.7)
Glucose, Bld: 94 mg/dL (ref 65–99)
Potassium: 3.7 mmol/L (ref 3.5–5.3)
Sodium: 144 mmol/L (ref 135–146)
Total Bilirubin: 0.3 mg/dL (ref 0.2–1.2)
Total Protein: 6.3 g/dL (ref 6.1–8.1)
eGFR: 101 mL/min/{1.73_m2} (ref >=60)

## 2021-06-22 LAB — HEPATITIS C ANTIBODY
Hepatitis C Ab: NONREACTIVE
SIGNAL TO CUT-OFF: 0.22 (ref <1.00)

## 2021-06-22 LAB — ANTI-NUCLEAR AB-TITER (ANA TITER)
ANA TITER: 1:320 {titer} — ABNORMAL HIGH
ANA Titer 1: 1:80 {titer} — ABNORMAL HIGH

## 2021-06-22 LAB — EPSTEIN-BARR VIRUS VCA ANTIBODY PANEL
EBV NA IgG: 335 U/mL — ABNORMAL HIGH
EBV VCA IgG: 724 U/mL — ABNORMAL HIGH
EBV VCA IgM: <36 U/mL

## 2021-06-22 LAB — HEPATITIS A ANTIBODY, TOTAL: Hepatitis A AB,Total: REACTIVE — AB

## 2021-06-22 LAB — HEPATITIS B CORE ANTIBODY, TOTAL: Hep B Core Total Ab: NONREACTIVE

## 2021-06-22 LAB — HEPATITIS B SURFACE ANTIBODY,QUALITATIVE: Hep B S Ab: NONREACTIVE

## 2021-06-22 LAB — ANA: Anti Nuclear Antibody (ANA): POSITIVE — AB

## 2021-06-22 LAB — HEPATITIS A ANTIBODY, IGM: Hep A IgM: NONREACTIVE

## 2021-06-22 LAB — HEPATITIS B E ANTIBODY: Hep B E Ab: NONREACTIVE

## 2021-06-22 LAB — ANTI-SMOOTH MUSCLE ANTIBODY, IGG: Actin (Smooth Muscle) Antibody (IGG): <20 U (ref <20)

## 2021-06-22 LAB — HEPATITIS C RNA QUANTITATIVE
HCV Quantitative Log: <1.18 log IU/mL
HCV RNA, PCR, QN: <15 IU/mL

## 2021-06-29 NOTE — Progress Notes (Unsigned)
Assessment and Plan:  There are no diagnoses linked to this encounter.    Further disposition pending results of labs. Discussed med's effects and SE's.   Over 30 minutes of exam, counseling, chart review, and critical decision making was performed.   Future Appointments  Date Time Provider [place]  06/30/2021  3:00 PM Magda Bernheim, NP [facility] None  08/13/2021 11:00 AM Magda Bernheim, NP [facility] None  02/08/2022  2:15 PM Megan Salon, MD [facility]  02/12/2022 10:00 AM Magda Bernheim, NP [facility] None    ------------------------------------------------------------------------------------------------------------------   HPI LMP 01/20/2011  66 y.o.female presents for  Past Medical History:  Diagnosis Date   Allergy    ANAL FISSURE, HX OF 09/24/2008   Qualifier: Diagnosis of  By: Tamala Julian CMA, Claiborne Billings     Anxiety    Arthritis    Fibroids    Uterine   GERD (gastroesophageal reflux disease)    HSV-2 (herpes simplex virus 2) infection    Hyperlipidemia    IBS (irritable bowel syndrome)    Interstitial cystitis    Kidney stone 2019   --Dr.Evans   Nevus    vulva nevus   Prediabetes      Allergies  Allergen Reactions   Sulfa Antibiotics Hives, Shortness Of Breath and Rash   Macrobid [Nitrofurantoin Macrocrystal] Nausea Only    Current Outpatient Medications on File Prior to Visit  Medication Sig   ALPRAZolam (XANAX) 0.25 MG tablet TAKE 1/2 TO 1 TABLET BY MOUTH 2 TO 3 TIMES A DAY ONLY IF NEEDED FOR ANXIETY ATTACK AND LIMIT TO 5 DAYS PER WEEK TO AVOID ADDICTION OR DEMENTIA   CALCIUM CITRATE PO Take 1,200 mg by mouth daily.   Cholecalciferol (VITAMIN D-3 PO) Take 1 capsule by mouth daily.   Cranberry-Vitamin C-Probiotic (AZO CRANBERRY PO) Take by mouth. PRN (Patient not taking: Reported on 06/15/2021)   Cyanocobalamin (B-12 PO) Take by mouth.   esomeprazole (NEXIUM) 40 MG capsule Take 1 capsule (40 mg total) by mouth 2 (two) times daily before a meal.   estradiol  (ESTRACE) 0.5 MG tablet Take 0.5 tablets (0.25 mg total) by mouth daily.   hyoscyamine (LEVSIN) 0.125 MG tablet Take 1 tablet (0.125 mg total) by mouth every 4 (four) hours as needed for cramping (diarrhea, nausea).   medroxyPROGESTERone (PROVERA) 2.5 MG tablet Take 1 tablet (2.5 mg total) by mouth daily.   oxybutynin (DITROPAN-XL) 10 MG 24 hr tablet Take 10 mg by mouth at bedtime.   rosuvastatin (CRESTOR) 20 MG tablet Take  1 tablet  Daily  for Cholesterol   sucralfate (CARAFATE) 1 g tablet Take 1 tablet (1 g total) by mouth 4 (four) times daily.   valACYclovir (VALTREX) 500 MG tablet Take 1 tablet (500 mg total) by mouth daily.   No current facility-administered medications on file prior to visit.    ROS: all negative except above.   Physical Exam:  LMP 01/20/2011   General Appearance: Well nourished, in no apparent distress. Eyes: PERRLA, EOMs, conjunctiva no swelling or erythema Sinuses: No Frontal/maxillary tenderness ENT/Mouth: Ext aud canals clear, TMs without erythema, bulging. No erythema, swelling, or exudate on post pharynx.  Tonsils not swollen or erythematous. Hearing normal.  Neck: Supple, thyroid normal.  Respiratory: Respiratory effort normal, BS equal bilaterally without rales, rhonchi, wheezing or stridor.  Cardio: RRR with no MRGs. Brisk peripheral pulses without edema.  Abdomen: Soft, + BS.  Non tender, no guarding, rebound, hernias, masses. Lymphatics: Non tender without lymphadenopathy.  Musculoskeletal:  Full ROM, 5/5 strength, normal gait.  Skin: Warm, dry without rashes, lesions, ecchymosis.  Neuro: Cranial nerves intact. Normal muscle tone, no cerebellar symptoms. Sensation intact.  Psych: Awake and oriented X 3, normal affect, Insight and Judgment appropriate.     Magda Bernheim, NP 1:02 PM [facility]

## 2021-06-30 ENCOUNTER — Encounter: Payer: Self-pay | Admitting: Nurse Practitioner

## 2021-06-30 ENCOUNTER — Ambulatory Visit: Payer: 59 | Admitting: Nurse Practitioner

## 2021-06-30 VITALS — BP 108/66 | HR 67 | Temp 97.3°F | Wt 105.6 lb

## 2021-06-30 DIAGNOSIS — R768 Other specified abnormal immunological findings in serum: Secondary | ICD-10-CM

## 2021-06-30 DIAGNOSIS — R7689 Other specified abnormal immunological findings in serum: Secondary | ICD-10-CM | POA: Insufficient documentation

## 2021-06-30 DIAGNOSIS — R7989 Other specified abnormal findings of blood chemistry: Secondary | ICD-10-CM

## 2021-06-30 DIAGNOSIS — J309 Allergic rhinitis, unspecified: Secondary | ICD-10-CM | POA: Diagnosis not present

## 2021-07-01 ENCOUNTER — Encounter: Payer: Self-pay | Admitting: Nurse Practitioner

## 2021-07-01 LAB — COMPLETE METABOLIC PANEL WITH GFR
AG Ratio: 2.2 (calc) (ref 1.0–2.5)
ALT: 17 U/L (ref 6–29)
AST: 18 U/L (ref 10–35)
Albumin: 4.3 g/dL (ref 3.6–5.1)
Alkaline phosphatase (APISO): 51 U/L (ref 37–153)
BUN: 11 mg/dL (ref 7–25)
CO2: 27 mmol/L (ref 20–32)
Calcium: 9.6 mg/dL (ref 8.6–10.4)
Chloride: 108 mmol/L (ref 98–110)
Creat: 0.57 mg/dL (ref 0.50–1.05)
Globulin: 2 g/dL (calc) (ref 1.9–3.7)
Glucose, Bld: 84 mg/dL (ref 65–99)
Potassium: 4.4 mmol/L (ref 3.5–5.3)
Sodium: 143 mmol/L (ref 135–146)
Total Bilirubin: 0.4 mg/dL (ref 0.2–1.2)
Total Protein: 6.3 g/dL (ref 6.1–8.1)
eGFR: 101 mL/min/{1.73_m2} (ref >=60)

## 2021-07-06 ENCOUNTER — Telehealth: Payer: Self-pay

## 2021-07-06 NOTE — Telephone Encounter (Signed)
Alprazolam refill request 

## 2021-07-07 ENCOUNTER — Other Ambulatory Visit: Payer: Self-pay | Admitting: Nurse Practitioner

## 2021-07-07 DIAGNOSIS — R002 Palpitations: Secondary | ICD-10-CM

## 2021-07-07 MED ORDER — ALPRAZOLAM 0.25 MG PO TABS: ORAL_TABLET | ORAL | 0 refills | Status: DC

## 2021-07-07 NOTE — Progress Notes (Signed)
PDMP is reviewed and appropriate  

## 2021-08-03 ENCOUNTER — Other Ambulatory Visit: Payer: Self-pay | Admitting: Nurse Practitioner

## 2021-08-03 DIAGNOSIS — R002 Palpitations: Secondary | ICD-10-CM

## 2021-08-07 ENCOUNTER — Telehealth: Payer: 59 | Admitting: Physician Assistant

## 2021-08-07 DIAGNOSIS — N390 Urinary tract infection, site not specified: Secondary | ICD-10-CM

## 2021-08-07 NOTE — Progress Notes (Signed)
Because of recurring UTI symptoms after recent treatment, you will require an in-person evaluation with your PCP or back at local in-person urgent care for repeat urinalysis and culture to know what drugs the infection may now be resistant to. You may need a referral from your PCP for a urologist giving your medical history if you do not already have one.    NOTE: There will be NO CHARGE for this eVisit   If you are having a true medical emergency please call 911.      For an urgent face to face visit, [facility] has seven urgent care centers for your convenience:     [facility] [place] [facility] [place] Get Driving Directions [phone] [address] [place], [place] [address]    [facility]) Get Driving Directions [phone] [place], [place] [address]  [facility] [place] ([place]) Get Driving Directions [phone] [address] [place],  [place]  [address]  [facility] [place] [facility] - at [address] Get Driving Directions  [phone] [address] [place],  [place] [address]   [facility] Get Driving Directions [phone] [address] [place], [place], [place] [address]   [facility] Get Driving Directions  [phone] [address] [place], [place] [address]   [facility] [place] Get Driving Directions [phone] [address] [place], [place] [address]  Your MyChart E-visit questionnaire answers were reviewed by a board certified advanced clinical practitioner to complete your personal care plan based on your specific symptoms.  Thank you for using e-Visits.

## 2021-08-08 ENCOUNTER — Other Ambulatory Visit: Payer: Self-pay | Admitting: Internal Medicine

## 2021-08-08 MED ORDER — CIPROFLOXACIN HCL 500 MG PO TABS: 500.0000 mg | ORAL_TABLET | Freq: Two times a day (BID) | ORAL | 0 refills | Status: AC

## 2021-08-13 ENCOUNTER — Ambulatory Visit: Payer: 59 | Admitting: Nurse Practitioner

## 2021-09-01 ENCOUNTER — Other Ambulatory Visit: Payer: Self-pay | Admitting: Nurse Practitioner

## 2021-09-01 DIAGNOSIS — R002 Palpitations: Secondary | ICD-10-CM

## 2021-09-03 ENCOUNTER — Other Ambulatory Visit: Payer: Self-pay | Admitting: Nurse Practitioner

## 2021-09-03 ENCOUNTER — Telehealth: Payer: Self-pay

## 2021-09-03 DIAGNOSIS — R002 Palpitations: Secondary | ICD-10-CM

## 2021-09-03 MED ORDER — ALPRAZOLAM 0.25 MG PO TABS: ORAL_TABLET | ORAL | 0 refills | Status: DC

## 2021-09-03 NOTE — Telephone Encounter (Signed)
Patient is requesting a refill on Alprazolam.  This was denied on the 8th but uncertain why.

## 2021-09-04 ENCOUNTER — Other Ambulatory Visit: Payer: Self-pay | Admitting: Internal Medicine

## 2021-09-04 ENCOUNTER — Encounter: Payer: Self-pay | Admitting: Nurse Practitioner

## 2021-09-04 DIAGNOSIS — R002 Palpitations: Secondary | ICD-10-CM

## 2021-09-04 DIAGNOSIS — G4700 Insomnia, unspecified: Secondary | ICD-10-CM

## 2021-09-04 DIAGNOSIS — K219 Gastro-esophageal reflux disease without esophagitis: Secondary | ICD-10-CM

## 2021-09-04 MED ORDER — ALPRAZOLAM 0.25 MG PO TABS: ORAL_TABLET | ORAL | 0 refills | Status: DC

## 2021-09-04 MED ORDER — OMEPRAZOLE 40 MG PO CPDR: DELAYED_RELEASE_CAPSULE | ORAL | 3 refills | Status: DC

## 2021-09-10 ENCOUNTER — Encounter: Payer: Self-pay | Admitting: Nurse Practitioner

## 2021-09-10 ENCOUNTER — Other Ambulatory Visit: Payer: Self-pay | Admitting: Nurse Practitioner

## 2021-09-10 DIAGNOSIS — G4700 Insomnia, unspecified: Secondary | ICD-10-CM

## 2021-09-10 MED ORDER — ALPRAZOLAM 0.25 MG PO TABS: ORAL_TABLET | ORAL | 0 refills | Status: DC

## 2021-09-10 NOTE — Progress Notes (Signed)
PDMP is reviewed and appropriate  

## 2021-10-09 ENCOUNTER — Other Ambulatory Visit: Payer: Self-pay | Admitting: Nurse Practitioner

## 2021-10-09 DIAGNOSIS — G4700 Insomnia, unspecified: Secondary | ICD-10-CM

## 2021-10-11 IMAGING — MR MR 3D RECON AT [facility]
18 of 20 series · 42 of 48 positions shown · IV contrast (gadavist)
Comparison: CT abdomen/pelvis dated 05/05/2017

CLINICAL DATA: Right abdominal pain x6 months



[Series 3: T2 fat-sat · axial · 6.0mm · 1.14mm/px · 1 of 36 slices shown]
[im 1/36]
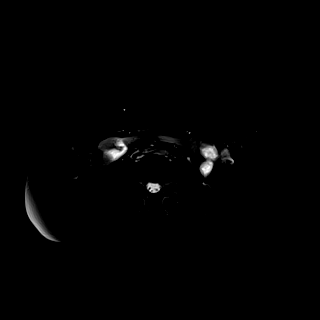

[Series 7: T2 · coronal · 6.0mm · 1.48mm/px · 1 of 30 slices shown (1 of 2)]
[im 1/30]
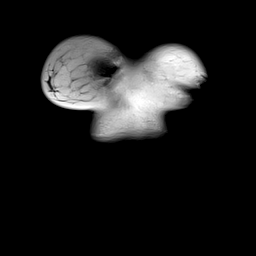

[Series 8: DWI · axial · 6.0mm · 1.36mm/px · z∈[-168,+84]mm · 3 of 72 slices shown (1 of 2)]
[im 1/72]
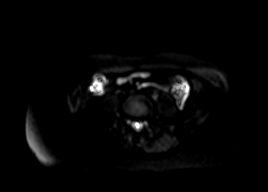
[im 36/72]
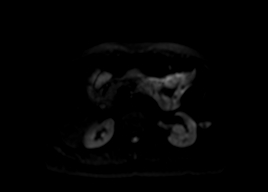
[im 72/72]
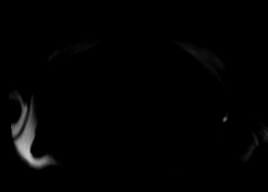

[Series 9: DWI · axial · 6.0mm · 1.36mm/px · 1 of 36 slices shown (2 of 2)]
[im 1/36]
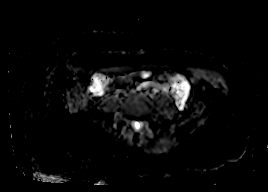

[Series 10: T1 · axial · 3.0mm · 1.06mm/px · z∈[-144,+69]mm · 3 of 72 slices shown (1 of 2)]
[im 1/72]
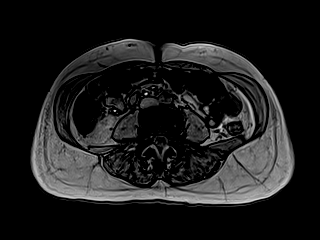
[im 36/72]
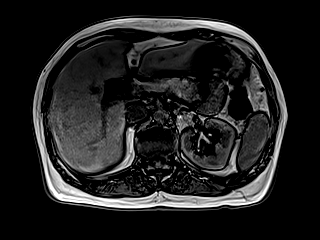
[im 72/72]
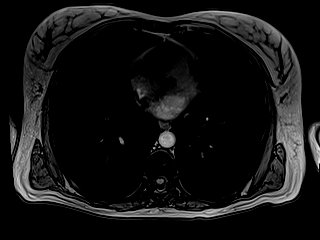

[Series 11: T1 · axial · 3.0mm · 1.06mm/px · z∈[-144,+69]mm · 3 of 72 slices shown (2 of 2)]
[im 1/72]
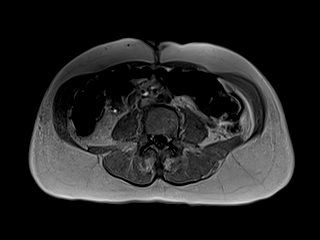
[im 36/72]
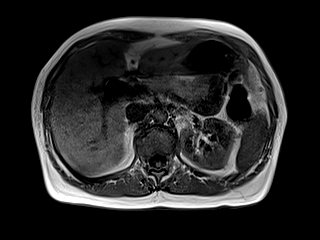
[im 72/72]
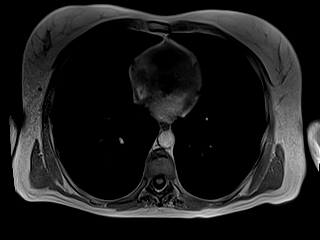

[Series 12: cor obl thk · sagittal · 50.0mm · 0.78mm/px · 1 of 9 slices shown]
[im 1/9]
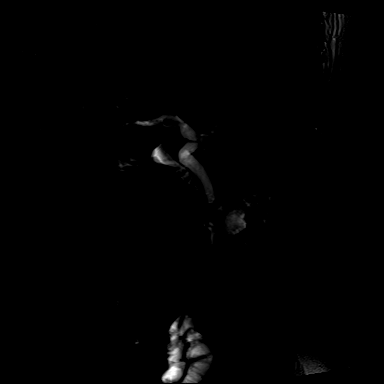

[Series 14: T2 · axial · 6.0mm · 1.33mm/px · 1 of 34 slices shown (2 of 2)]
[im 1/34]
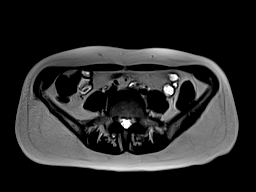

[Series 15: cor_3d_spc_trig · coronal · 1.0mm · 0.49mm/px · 3 of 88 slices shown]
[im 1/88]
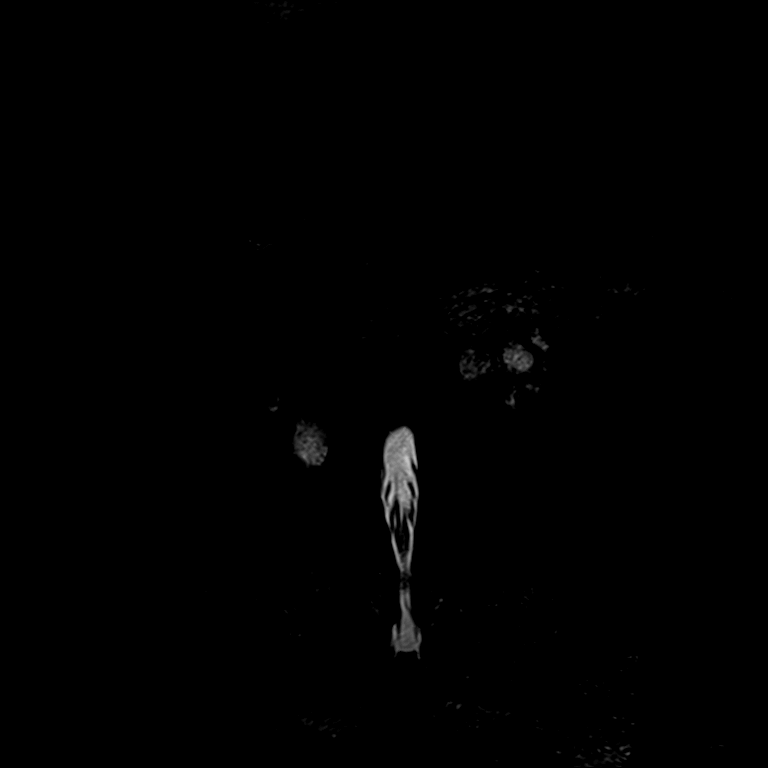
[im 44/88]
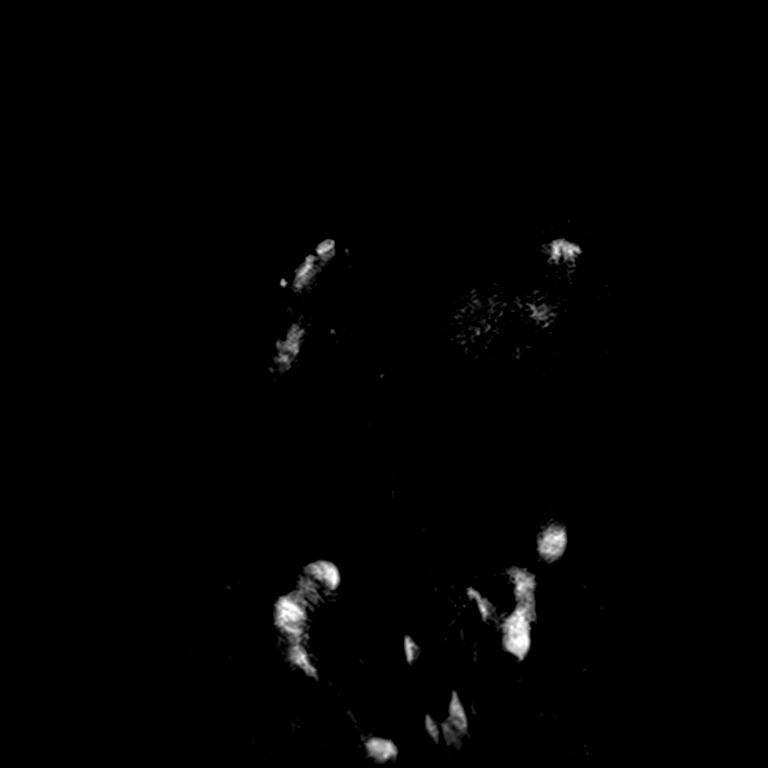
[im 88/88]
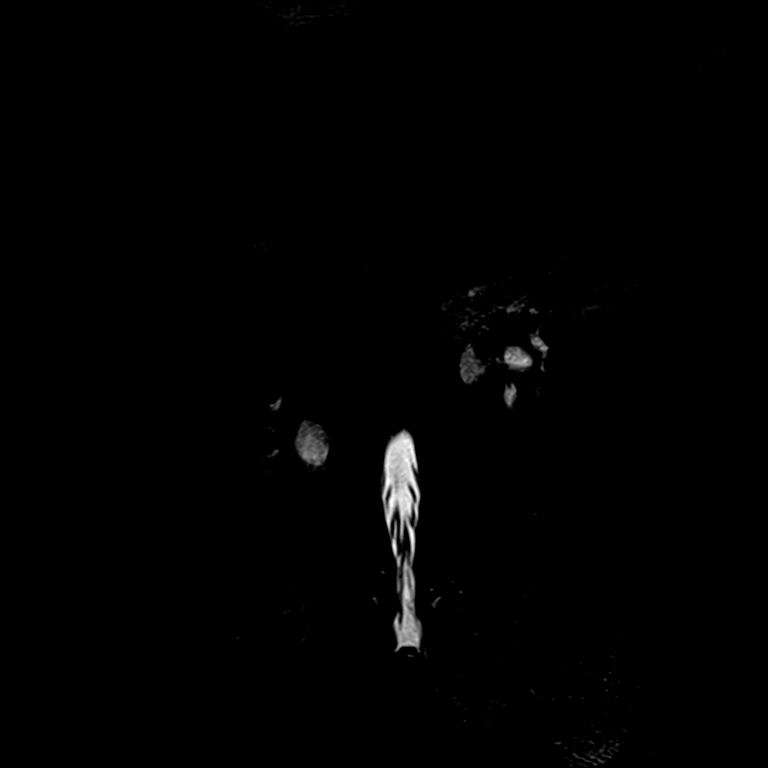

[Series 18: T1 dynamic · axial · 3.0mm · 1.06mm/px · z∈[-170,+67]mm · 3 of 80 slices shown (1 of 6)]
[im 1/80]
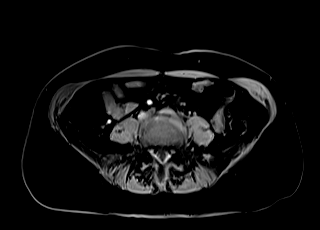
[im 40/80]
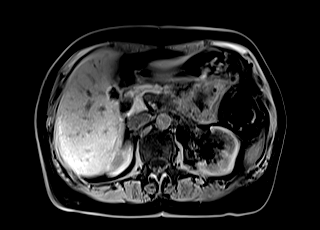
[im 80/80]
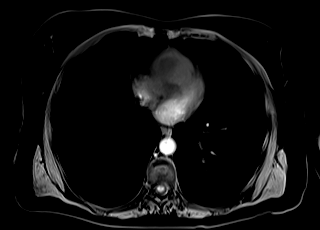

[Series 21: T1 dynamic · axial · 3.0mm · 1.06mm/px · z∈[-170,+67]mm · 3 of 80 slices shown (2 of 6)]
[im 1/80]
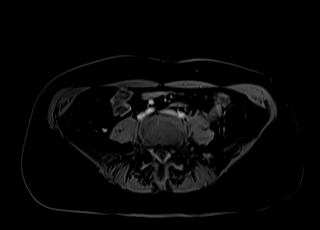
[im 40/80]
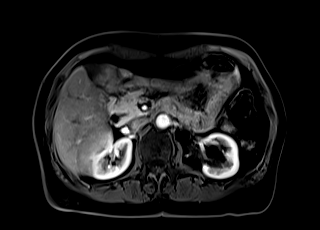
[im 80/80]
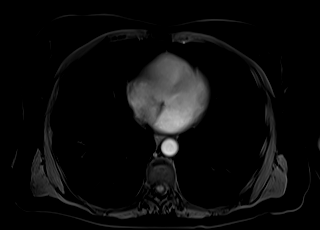

[Series 23: T1 dynamic · axial · 3.0mm · 1.06mm/px · z∈[-170,+67]mm · 3 of 80 slices shown (3 of 6)]
[im 1/80]
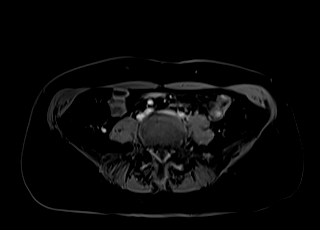
[im 40/80]
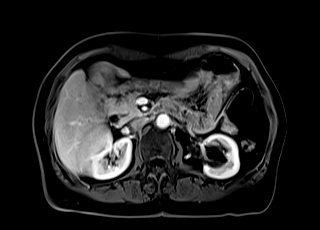
[im 80/80]
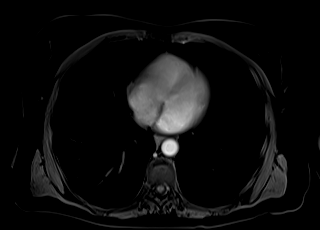

[Series 25: T1 dynamic · axial · 3.0mm · 1.06mm/px · z∈[-170,+67]mm · 3 of 80 slices shown (4 of 6)]
[im 1/80]
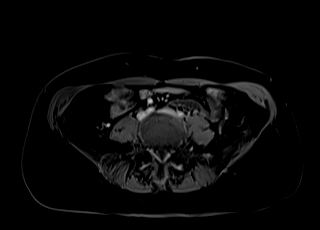
[im 40/80]
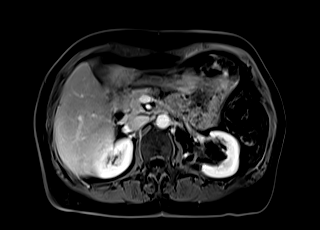
[im 80/80]
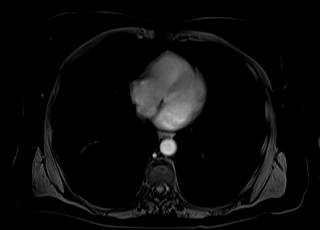

[Series 27: T1 dynamic · coronal · 3.0mm · 1.41mm/px · 3 of 72 slices shown (5 of 6)]
[im 1/72]
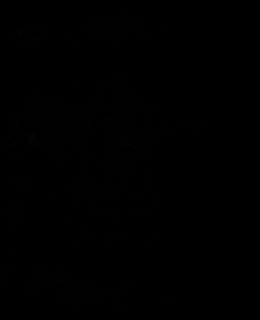
[im 36/72]
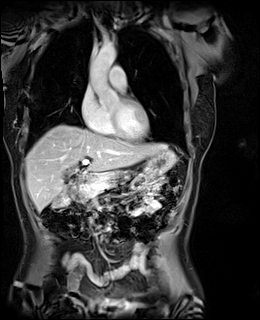
[im 72/72]
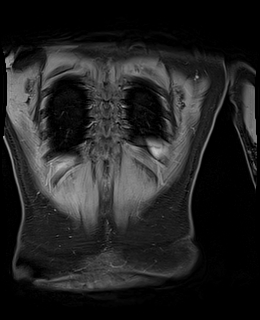

[Series 29: T1 dynamic · axial · 3.0mm · 1.06mm/px · z∈[-170,+67]mm · 3 of 80 slices shown (6 of 6)]
[im 1/80]
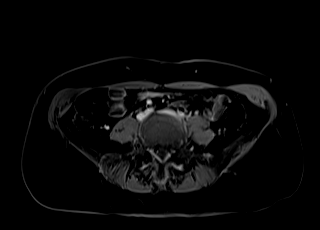
[im 40/80]
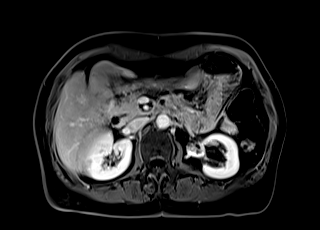
[im 80/80]
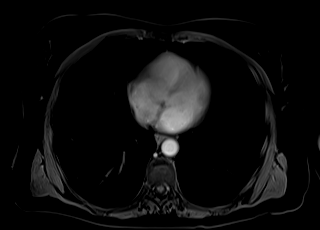

[Series 101: sub 20 sec · axial · 3.0mm · 1.06mm/px · z∈[-170,+67]mm · 3 of 80 slices shown]
[im 1/80]
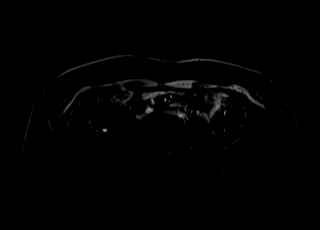
[im 40/80]
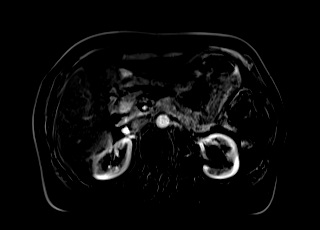
[im 80/80]
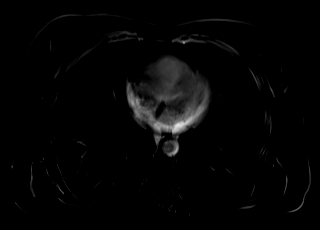

[Series 102: sub 45 sec · axial · 3.0mm · 1.06mm/px · z∈[-170,+67]mm · 3 of 80 slices shown]
[im 1/80]
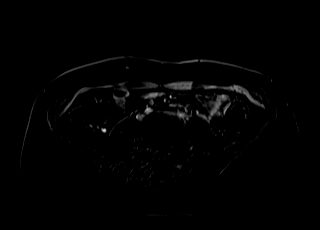
[im 40/80]
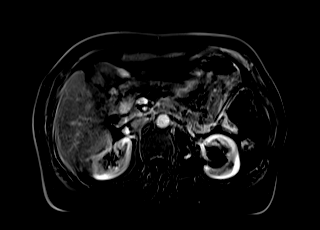
[im 80/80]
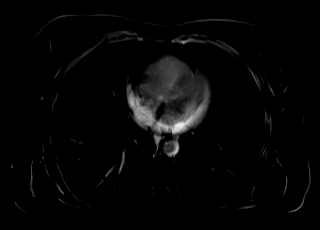

[Series 103: sub 90 sec · axial · 3.0mm · 1.06mm/px · 1 of 80 slices shown]
[im 1/80]
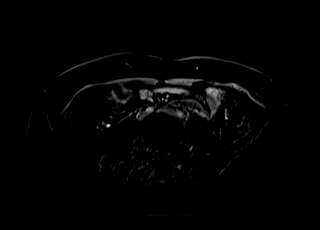

[42 of 48 positions shown; findings below may reference images not displayed]

FINDINGS: Lower chest: Lung bases are clear.

Hepatobiliary: 11 mm hemangioma in segment 4A (series 3/image 9).
Liver is otherwise within normal limits. No suspicious hepatic
lesions. No hepatic steatosis.

Status post cholecystectomy. No intrahepatic or extrahepatic ductal
dilatation. Common duct measures 8 mm. No choledocholithiasis is
seen.

Pancreas:  Within normal limits.

Spleen:  Within normal limits.

Adrenals/Urinary Tract:  Adrenal glands are within normal limits.

Left renal sinus cysts. Right kidney is within normal limits. No
hydronephrosis.

Stomach/Bowel: Stomach is within normal limits.

Visualized bowel is unremarkable.

Vascular/Lymphatic:  No evidence of abdominal aortic aneurysm.

No suspicious abdominal lymphadenopathy.

Other:  No abdominal ascites.

Musculoskeletal: No focal osseous lesions.
IMPRESSION: Status post cholecystectomy. No intrahepatic or extrahepatic ductal
dilatation. No choledocholithiasis is seen.

11 mm hepatic hemangioma, benign.

## 2021-10-13 ENCOUNTER — Encounter: Payer: Self-pay | Admitting: Nurse Practitioner

## 2021-10-13 NOTE — Telephone Encounter (Signed)
Please let pt know form is completed and signed

## 2021-10-13 NOTE — Telephone Encounter (Signed)
Did we get a preop form for this patient

## 2021-10-16 ENCOUNTER — Ambulatory Visit: Payer: 59 | Admitting: Nurse Practitioner

## 2021-10-19 ENCOUNTER — Ambulatory Visit: Payer: 59 | Admitting: Nurse Practitioner

## 2021-10-20 ENCOUNTER — Encounter: Payer: Self-pay | Admitting: Internal Medicine

## 2021-10-20 LAB — HM MAMMOGRAPHY

## 2021-10-22 ENCOUNTER — Encounter: Payer: Self-pay | Admitting: Nurse Practitioner

## 2021-10-27 ENCOUNTER — Encounter: Payer: Self-pay | Admitting: Internal Medicine

## 2021-11-06 ENCOUNTER — Other Ambulatory Visit: Payer: Self-pay | Admitting: Nurse Practitioner

## 2021-11-06 ENCOUNTER — Encounter: Payer: Self-pay | Admitting: Nurse Practitioner

## 2021-11-06 DIAGNOSIS — G4700 Insomnia, unspecified: Secondary | ICD-10-CM

## 2021-11-08 ENCOUNTER — Other Ambulatory Visit: Payer: Self-pay | Admitting: Nurse Practitioner

## 2021-11-10 MED ORDER — ALPRAZOLAM 0.25 MG PO TABS: ORAL_TABLET | ORAL | 0 refills | Status: DC

## 2021-12-11 ENCOUNTER — Other Ambulatory Visit: Payer: Self-pay | Admitting: Nurse Practitioner

## 2021-12-11 DIAGNOSIS — G4700 Insomnia, unspecified: Secondary | ICD-10-CM

## 2021-12-16 NOTE — Progress Notes (Addendum)
Anesthesia Review:  PCP: DR Unk Pinto  Cardiologist : none  Chest x-ray : EKG : 02/12/21  Monitor- 2021  Echo : Stress test: Cardiac Cath :  Activity level: can do a flight of stairs without difficulty  Sleep Study/ CPAP : none  Fasting Blood Sugar :      / Checks Blood Sugar -- times a day:   Blood Thinner/ Instructions /Last Dose: ASA / Instructions/ Last Dose :   Hx of Hep A 06/2021- in [place].

## 2021-12-21 NOTE — Patient Instructions (Addendum)
SURGICAL WAITING ROOM VISITATION Patients having surgery or a procedure may have no more than 2 support people in the waiting area - these visitors may rotate.   Children under the age of 22 must have an adult with them who is not the patient. If the patient needs to stay at the hospital during part of their recovery, the visitor guidelines for inpatient rooms apply. Pre-op nurse will coordinate an appropriate time for 1 support person to accompany patient in pre-op.  This support person may not rotate.    Please refer to the [facility] website for the visitor guidelines for Inpatients (after your surgery is over and you are in a regular room).       Your procedure is scheduled on:  01/04/2022    Report to [facility] Main Entrance    Report to admitting at   [phone]   Call this number if you have problems the morning of surgery [phone]   Do not eat food :After Midnight.   After Midnight you may have the following liquids until __ 0430____ Am  DAY OF SURGERY  Water Non-Citrus Juices (without pulp, NO RED) Carbonated Beverages Black Coffee (NO MILK/CREAM OR CREAMERS, sugar ok)  Clear Tea (NO MILK/CREAM OR CREAMERS, sugar ok) regular and decaf                             Plain Jell-O (NO RED)                                           Fruit ices (not with fruit pulp, NO RED)                                     Popsicles (NO RED)                                                               Sports drinks like Gatorade (NO RED)                 The day of surgery:  Drink ONE (1) Pre-Surgery Clear Ensure or G2 at   0430 AM   ( have completed by ) the morning of surgery. Drink in one sitting. Do not sip.  This drink was given to you during your hospital  pre-op appointment visit. Nothing else to drink after completing the  Pre-Surgery Clear Ensure or G2.          If you have questions, please contact your surgeon's office.   BOWEL PREP AND ANY ADDITIONAL PRE OP  INSTRUCTIONS YOU RECEIVED FROM YOUR SURGEON'S OFFICE!!!     Oral Hygiene is also important to reduce your risk of infection.                                    Remember - BRUSH YOUR TEETH THE MORNING OF SURGERY WITH YOUR REGULAR TOOTHPASTE   Do NOT smoke after Midnight   Take these medicines the morning  of surgery with A SIP OF WATER:   omeprazole   DO NOT TAKE ANY ORAL DIABETIC MEDICATIONS DAY OF YOUR SURGERY  Bring CPAP mask and tubing day of surgery.                              You may not have any metal on your body including hair pins, jewelry, and body piercing             Do not wear make-up, lotions, powders, perfumes/cologne, or deodorant  Do not wear nail polish including gel and S&S, artificial/acrylic nails, or any other type of covering on natural nails including finger and toenails. If you have artificial nails, gel coating, etc. that needs to be removed by a nail salon please have this removed prior to surgery or surgery may need to be canceled/ delayed if the surgeon/ anesthesia feels like they are unable to be safely monitored.   Do not shave  48 hours prior to surgery.               Men may shave face and neck.   Do not bring valuables to the hospital. [place].   Contacts, dentures or bridgework may not be worn into surgery.   Bring small overnight bag day of surgery.   DO NOT [place]. PHARMACY WILL DISPENSE MEDICATIONS LISTED ON YOUR MEDICATION LIST TO YOU DURING YOUR ADMISSION [place]!    Patients discharged on the day of surgery will not be allowed to drive home.  Someone NEEDS to stay with you for the first 24 hours after anesthesia.   Special Instructions: Bring a copy of your healthcare power of attorney and living will documents the day of surgery if you haven't scanned them before.              Please read over the following fact sheets you were given: IF [place] [phone]   If you received a COVID test during your pre-op visit  it is requested that you wear a mask when out in public, stay away from anyone that may not be feeling well and notify your surgeon if you develop symptoms. If you test positive for Covid or have been in contact with anyone that has tested positive in the last 10 days please notify you surgeon.    [facility] - Preparing for Surgery Before surgery, you can play an important role.  Because skin is not sterile, your skin needs to be as free of germs as possible.  You can reduce the number of germs on your skin by washing with CHG (chlorahexidine gluconate) soap before surgery.  CHG is an antiseptic cleaner which kills germs and bonds with the skin to continue killing germs even after washing. Please DO NOT use if you have an allergy to CHG or antibacterial soaps.  If your skin becomes reddened/irritated stop using the CHG and inform your nurse when you arrive at Short Stay. Do not shave (including legs and underarms) for at least 48 hours prior to the first CHG shower.  You may shave your face/neck. Please follow these instructions carefully:  1.  Shower with CHG Soap the night before surgery and the  morning of Surgery.  2.  If you  choose to wash your hair, wash your hair first as usual with your  normal  shampoo.  3.  After you shampoo, rinse your hair and body thoroughly to remove the  shampoo.                           4.  Use CHG as you would any other liquid soap.  You can apply chg directly  to the skin and wash                       Gently with a scrungie or clean washcloth.  5.  Apply the CHG Soap to your body ONLY FROM THE NECK DOWN.   Do not use on face/ open                           Wound or open sores. Avoid contact with eyes, ears mouth and genitals (private parts).                       Wash face,  Genitals (private parts) with your normal soap.             6.   Wash thoroughly, paying special attention to the area where your surgery  will be performed.  7.  Thoroughly rinse your body with warm water from the neck down.  8.  DO NOT shower/wash with your normal soap after using and rinsing off  the CHG Soap.                9.  Pat yourself dry with a clean towel.            10.  Wear clean pajamas.            11.  Place clean sheets on your bed the night of your first shower and do not  sleep with pets. Day of Surgery : Do not apply any lotions/deodorants the morning of surgery.  Please wear clean clothes to the hospital/surgery center.  FAILURE TO FOLLOW THESE INSTRUCTIONS MAY RESULT IN THE CANCELLATION OF YOUR SURGERY PATIENT SIGNATURE_________________________________  NURSE SIGNATURE__________________________________  ________________________________________________________________________

## 2021-12-21 NOTE — H&P (Signed)
HIP ARTHROPLASTY ADMISSION H&P  Patient ID: [name] MRN: [id number] DOB/AGE: 64/16/1957 64 y.o.  Chief Complaint: right hip pain.  Planned Procedure Date: 01/04/22 Medical Clearance by Lorenda Peck, PA  HPI: Patricia Ryan is a 66 y.o. female who presents for evaluation of OA RIGHT HIP. The patient has a history of pain and functional disability in the right hip due to arthritis and has failed non-surgical conservative treatments for greater than 12 weeks to include {nonsurgical conservative treatment (must select two):3049030}.  Onset of symptoms was {abrupt, gradual:20671}, starting {1->10 years:3049031} years ago with {stable, gradual worsening, rapidly worsening:3049032} course since that time. The patient noted {no past surgery, prior procedures:3049033} on the {Left/right:3049041} hip.  Patient currently rates pain at {1-10:3049035} out of 10 with activity. Patient has {night pain, worsening of pain with activity and weight bearing:3049036}.  Patient has evidence of {Radiographic or MRI evidence of (must document one of the below):3046104} by imaging studies.  There is no active infection.  Past Medical History:  Diagnosis Date   Allergy    ANAL FISSURE, HX OF 09/24/2008   Qualifier: Diagnosis of  By: Tamala Julian CMA, Claiborne Billings     Anxiety    Arthritis    Fibroids    Uterine   GERD (gastroesophageal reflux disease)    HSV-2 (herpes simplex virus 2) infection    Hyperlipidemia    IBS (irritable bowel syndrome)    Interstitial cystitis    Kidney stone 2019   --Dr.Evans   Nevus    vulva nevus   Prediabetes    Past Surgical History:  Procedure Laterality Date   BLADDER REPAIR     BUNIONECTOMY     CHOLECYSTECTOMY     COLONOSCOPY     LAPAROSCOPY  1985/ 1990   due to infertility   LITHOTRIPSY     TONSILLECTOMY     tubal reconstruction     tubalplasty  1985/ 1990    UPPER GASTROINTESTINAL ENDOSCOPY     VAGINAL DELIVERY     x2   Allergies  Allergen Reactions   Sulfa  Antibiotics Hives, Shortness Of Breath and Rash   Macrobid [Nitrofurantoin Macrocrystal] Nausea Only   Prior to Admission medications   Medication Sig Start Date End Date Taking? Authorizing Provider  ALPRAZolam (XANAX) 0.25 MG tablet TAKE 1/2 TO 1 TABLET BY MOUTH EVERY NIGHT AT BEDTIME ONLY AS NEEDED FOR SLEEP. LIMIT TO 5 DAYS PER WEEK TO AVOID ADDICTION AND DEMENTIA 12/11/21   Alycia Rossetti, NP  CALCIUM CITRATE PO Take 1,200 mg by mouth daily.    [provider]  Cholecalciferol (VITAMIN D-3 PO) Take 1 capsule by mouth daily.    [provider]  Cyanocobalamin (B-12 PO) Take by mouth.    [provider]  estradiol (ESTRACE) 0.5 MG tablet Take 0.5 tablets (0.25 mg total) by mouth daily. 01/29/21   Megan Salon, MD  hyoscyamine (LEVSIN) 0.125 MG tablet Take 1 tablet (0.125 mg total) by mouth every 4 (four) hours as needed for cramping (diarrhea, nausea). 06/08/21   Alycia Rossetti, NP  ibuprofen (ADVIL) 200 MG tablet Take 200 mg by mouth every 6 (six) hours as needed.    [provider]  medroxyPROGESTERone (PROVERA) 2.5 MG tablet Take 1 tablet (2.5 mg total) by mouth daily. 01/29/21   Megan Salon, MD  omeprazole (PRILOSEC) 40 MG capsule TAKE 1 CAPSULE DAILY TO PREVENT HEARTBURN & INDIGESTION 09/04/21   Unk Pinto, MD  oxybutynin (DITROPAN-XL) 10 MG 24 hr  tablet Take 10 mg by mouth at bedtime.    [provider]  rosuvastatin (CRESTOR) 20 MG tablet Take  1 tablet  Daily  for Cholesterol 05/09/21   Unk Pinto, MD  valACYclovir (VALTREX) 500 MG tablet Take 1 tablet (500 mg total) by mouth daily. 01/29/21   Megan Salon, MD   Social History   Socioeconomic History   Marital status: Married    Spouse name: Not on file   Number of children: Not on file   Years of education: Not on file   Highest education level: Not on file  Occupational History   Not on file  Tobacco Use   Smoking status: Former    Types: Cigarettes    Quit date:  12/26/2011    Years since quitting: 9.9   Smokeless tobacco: Never  Vaping Use   Vaping Use: Never used  Substance and Sexual Activity   Alcohol use: Yes    Alcohol/week: 5.0 standard drinks of alcohol    Types: 5 Glasses of wine per week    Comment: weekends mostly   Drug use: No   Sexual activity: Yes    Partners: Male    Birth control/protection: Post-menopausal  Other Topics Concern   Not on file  Social History Narrative   Not on file   Social Determinants of Health   Financial Resource Strain: Not on file  Food Insecurity: Not on file  Transportation Needs: Not on file  Physical Activity: Not on file  Stress: Not on file  Social Connections: Not on file   Family History  Problem Relation Age of Onset   Celiac disease Mother    Heart disease Father    Irritable bowel syndrome Sister    Thyroid disease Sister    Pancreatic cancer Maternal Grandmother    Lung cancer Maternal Grandmother    Diabetes Paternal Grandmother    Breast cancer Paternal Grandmother    Kidney disease Other        tumor   Cancer Sister 22       Melanoma   Heart disease Sister    Colon cancer Neg Hx    Esophageal cancer Neg Hx    Rectal cancer Neg Hx    Stomach cancer Neg Hx     ROS: Currently denies lightheadedness, dizziness, Fever, chills, CP, SOB. No personal history of DVT, PE, MI, or CVA. No loose teeth or dentures All other systems have been reviewed and were otherwise currently negative with the exception of those mentioned in the HPI and as above.  Objective: Vitals: Ht: *** Wt: *** lbs Temp: *** BP: *** Pulse: *** O2 ***% on room air.   Physical Exam: General: Alert, NAD. Trendelenberg Gait *** HEENT: EOMI, Good Neck Extension *** Pulm: No increased work of breathing.  Clear B/L A/P w/o crackle or wheeze. *** CV: RRR, No m/g/r appreciated *** GI: soft, NT, ND Neuro: Neuro without gross focal deficit.  Sensation intact distally Skin: No lesions in the area of chief  complaint MSK/Surgical Site: *** Hip pain with passive ROM.  Positive Stinchfield.  5/5 strength.  NVI.    Imaging Review Plain radiographs demonstrate severe degenerative joint disease of the right hip.   The bone quality appears to be adequate for age and reported activity level.  Preoperative templating of the joint replacement has been completed, documented, and submitted to the Operating Room personnel in order to optimize intra-operative equipment management.  Assessment: OA RIGHT HIP    Plan: Plan  for Procedure(s): TOTAL HIP ARTHROPLASTY  The patient history, physical exam, clinical judgement of the provider and imaging are consistent with end stage degenerative joint disease and total joint arthroplasty is deemed medically necessary. The treatment options including medical management, injection therapy, and arthroplasty were discussed at length. The risks and benefits of Procedure(s): TOTAL HIP ARTHROPLASTY were presented and reviewed.  The risks of nonoperative treatment, versus surgical intervention including but not limited to continued pain, aseptic loosening, stiffness, dislocation/subluxation, infection, bleeding, nerve injury, blood clots, cardiopulmonary complications, morbidity, mortality, among others were discussed. The patient verbalizes understanding and wishes to proceed with the plan.  Patient is being admitted for surgery, pain control, PT, prophylactic antibiotics, VTE prophylaxis, progressive ambulation, ADL's and discharge planning.   Dental prophylaxis discussed and recommended for 2 years postoperatively.  The patient does meet the criteria for TXA which will be used perioperatively.   ASA 81 mg BID  will be used postoperatively for DVT prophylaxis in addition to SCDs, and early ambulation.  The patient is planning to be discharged home with OPPT in care of her spouse    Jola Baptist 12/21/2021 4:51 PM

## 2021-12-23 ENCOUNTER — Other Ambulatory Visit: Payer: Self-pay

## 2021-12-23 ENCOUNTER — Encounter (HOSPITAL_COMMUNITY)
Admission: RE | Admit: 2021-12-23 | Discharge: 2021-12-23 | Disposition: A | Discharge status: Home / Self Care | Payer: 59 | Source: Ambulatory Visit | Attending: Orthopedic Surgery | Admitting: Orthopedic Surgery

## 2021-12-23 ENCOUNTER — Encounter (HOSPITAL_COMMUNITY): Payer: Self-pay

## 2021-12-23 VITALS — BP 125/68 | HR 64 | Temp 97.9°F | Resp 16 | Ht 63.0 in | Wt 105.0 lb

## 2021-12-23 DIAGNOSIS — Z01818 Encounter for other preprocedural examination: Secondary | ICD-10-CM

## 2021-12-23 DIAGNOSIS — Z01812 Encounter for preprocedural laboratory examination: Secondary | ICD-10-CM | POA: Diagnosis present

## 2021-12-23 HISTORY — DX: Inflammatory liver disease, unspecified: K75.9

## 2021-12-23 HISTORY — DX: Malignant (primary) neoplasm, unspecified: C80.1

## 2021-12-23 HISTORY — DX: Personal history of urinary calculi: Z87.442

## 2021-12-23 LAB — CBC
HCT: 40.2 % (ref 36.0–46.0)
Hemoglobin: 12.8 g/dL (ref 12.0–15.0)
MCH: 30.7 pg (ref 26.0–34.0)
MCHC: 31.8 g/dL (ref 30.0–36.0)
MCV: 96.4 fL (ref 80.0–100.0)
Platelets: 243 10*3/uL (ref 150–400)
RBC: 4.17 MIL/uL (ref 3.87–5.11)
RDW: 12.6 % (ref 11.5–15.5)
WBC: 6 10*3/uL (ref 4.0–10.5)
nRBC: 0 % (ref 0.0–0.2)

## 2021-12-23 LAB — COMPREHENSIVE METABOLIC PANEL
ALT: 23 U/L (ref 0–44)
AST: 25 U/L (ref 15–41)
Albumin: 3.8 g/dL (ref 3.5–5.0)
Alkaline Phosphatase: 49 U/L (ref 38–126)
Anion gap: 6 (ref 5–15)
BUN: 14 mg/dL (ref 8–23)
CO2: 27 mmol/L (ref 22–32)
Calcium: 9.4 mg/dL (ref 8.9–10.3)
Chloride: 109 mmol/L (ref 98–111)
Creatinine, Ser: 0.7 mg/dL (ref 0.44–1.00)
GFR, Estimated: >60 mL/min (ref >60)
Glucose, Bld: 101 mg/dL — ABNORMAL HIGH (ref 70–99)
Potassium: 4.4 mmol/L (ref 3.5–5.1)
Sodium: 142 mmol/L (ref 135–145)
Total Bilirubin: 0.5 mg/dL (ref 0.3–1.2)
Total Protein: 6.5 g/dL (ref 6.5–8.1)

## 2021-12-23 LAB — SURGICAL PCR SCREEN
MRSA, PCR: NEGATIVE
Staphylococcus aureus: NEGATIVE

## 2022-01-03 NOTE — Anesthesia Preprocedure Evaluation (Signed)
Anesthesia Evaluation  Patient identified by MRN, date of birth, ID band Patient awake    Reviewed: Allergy & Precautions, NPO status , Patient's Chart, lab work & pertinent test results  History of Anesthesia Complications Negative for: history of anesthetic complications  Airway Mallampati: II  TM Distance: >3 FB Neck ROM: Full    Dental no notable dental hx.    Pulmonary former smoker   Pulmonary exam normal        Cardiovascular Normal cardiovascular exam     Neuro/Psych   Anxiety     negative neurological ROS     GI/Hepatic Neg liver ROS,GERD  Medicated,,  Endo/Other  negative endocrine ROS    Renal/GU negative Renal ROS  negative genitourinary   Musculoskeletal  (+) Arthritis ,    Abdominal   Peds  Hematology negative hematology ROS (+)   Anesthesia Other Findings Day of surgery medications reviewed with patient.  Reproductive/Obstetrics negative OB ROS                              Anesthesia Physical Anesthesia Plan  ASA: 2  Anesthesia Plan: Spinal   Post-op Pain Management: Tylenol PO (pre-op)*   Induction:   PONV Risk Score and Plan: 3 and Treatment may vary due to age or medical condition, Ondansetron, Propofol infusion, Dexamethasone and Midazolam  Airway Management Planned: Natural Airway and Simple Face Mask  Additional Equipment: None  Intra-op Plan:   Post-operative Plan:   Informed Consent: I have reviewed the patients History and Physical, chart, labs and discussed the procedure including the risks, benefits and alternatives for the proposed anesthesia with the patient or authorized representative who has indicated his/her understanding and acceptance.       Plan Discussed with: CRNA  Anesthesia Plan Comments:         Anesthesia Quick Evaluation

## 2022-01-04 ENCOUNTER — Ambulatory Visit (HOSPITAL_COMMUNITY): Payer: 59

## 2022-01-04 ENCOUNTER — Encounter (HOSPITAL_COMMUNITY)
Admission: RE | Disposition: A | Discharge status: Home / Self Care | Payer: Self-pay | Source: Ambulatory Visit | Attending: Orthopedic Surgery

## 2022-01-04 ENCOUNTER — Other Ambulatory Visit: Payer: Self-pay

## 2022-01-04 ENCOUNTER — Encounter (HOSPITAL_COMMUNITY): Payer: Self-pay | Admitting: Orthopedic Surgery

## 2022-01-04 ENCOUNTER — Ambulatory Visit (HOSPITAL_COMMUNITY)
Admission: RE | Admit: 2022-01-04 | Discharge: 2022-01-04 | Disposition: A | Discharge status: Home / Self Care | Payer: 59 | Source: Ambulatory Visit | Attending: Orthopedic Surgery | Admitting: Orthopedic Surgery

## 2022-01-04 ENCOUNTER — Ambulatory Visit (HOSPITAL_BASED_OUTPATIENT_CLINIC_OR_DEPARTMENT_OTHER): Payer: 59 | Admitting: Certified Registered Nurse Anesthetist

## 2022-01-04 ENCOUNTER — Ambulatory Visit (HOSPITAL_COMMUNITY): Payer: 59 | Admitting: Physician Assistant

## 2022-01-04 DIAGNOSIS — K219 Gastro-esophageal reflux disease without esophagitis: Secondary | ICD-10-CM | POA: Diagnosis not present

## 2022-01-04 DIAGNOSIS — F419 Anxiety disorder, unspecified: Secondary | ICD-10-CM | POA: Diagnosis not present

## 2022-01-04 DIAGNOSIS — Z87891 Personal history of nicotine dependence: Secondary | ICD-10-CM | POA: Insufficient documentation

## 2022-01-04 DIAGNOSIS — M879 Osteonecrosis, unspecified: Secondary | ICD-10-CM | POA: Insufficient documentation

## 2022-01-04 DIAGNOSIS — Z01818 Encounter for other preprocedural examination: Secondary | ICD-10-CM

## 2022-01-04 DIAGNOSIS — M1611 Unilateral primary osteoarthritis, right hip: Secondary | ICD-10-CM | POA: Diagnosis present

## 2022-01-04 HISTORY — PX: TOTAL HIP ARTHROPLASTY: SHX124

## 2022-01-04 LAB — TYPE AND SCREEN
ABO/RH(D): A NEG
Antibody Screen: NEGATIVE

## 2022-01-04 SURGERY — ARTHROPLASTY, HIP, TOTAL,POSTERIOR APPROACH
Anesthesia: Spinal | Site: Hip | Laterality: Right

## 2022-01-04 MED ORDER — POVIDONE-IODINE 10 % EX SWAB
2.0000 | Freq: Once | CUTANEOUS | Status: AC
  Administered 2022-01-04: 2 via TOPICAL

## 2022-01-04 MED ORDER — PHENYLEPHRINE HCL-NACL 20-0.9 MG/250ML-% IV SOLN
INTRAVENOUS | Status: AC
  Filled 2022-01-04: qty 250

## 2022-01-04 MED ORDER — FENTANYL CITRATE PF 50 MCG/ML IJ SOSY
25.0000 ug | PREFILLED_SYRINGE | INTRAMUSCULAR | Status: DC | PRN
  Administered 2022-01-04: 25 ug via INTRAVENOUS

## 2022-01-04 MED ORDER — ONDANSETRON HCL 4 MG/2ML IJ SOLN
INTRAMUSCULAR | Status: AC
  Filled 2022-01-04: qty 2

## 2022-01-04 MED ORDER — SODIUM CHLORIDE (PF) 0.9 % IJ SOLN
INTRAMUSCULAR | Status: AC
  Filled 2022-01-04: qty 10

## 2022-01-04 MED ORDER — ACETAMINOPHEN 500 MG PO TABS
ORAL_TABLET | ORAL | Status: AC
  Administered 2022-01-04: 1000 mg via ORAL
  Filled 2022-01-04: qty 2

## 2022-01-04 MED ORDER — LACTATED RINGERS IV BOLUS
250.0000 mL | Freq: Once | INTRAVENOUS | Status: AC
  Administered 2022-01-04: 250 mL via INTRAVENOUS

## 2022-01-04 MED ORDER — MIDAZOLAM HCL 2 MG/2ML IJ SOLN
INTRAMUSCULAR | Status: AC
  Filled 2022-01-04: qty 2

## 2022-01-04 MED ORDER — OXYCODONE HCL 5 MG PO TABS
ORAL_TABLET | ORAL | Status: AC
  Administered 2022-01-04: 5 mg via ORAL
  Filled 2022-01-04: qty 1

## 2022-01-04 MED ORDER — ONDANSETRON HCL 4 MG PO TABS: 4.0000 mg | ORAL_TABLET | Freq: Four times a day (QID) | ORAL | Status: DC | PRN

## 2022-01-04 MED ORDER — ACETAMINOPHEN 500 MG PO TABS: 1000.0000 mg | ORAL_TABLET | Freq: Once | ORAL | Status: DC

## 2022-01-04 MED ORDER — ACETAMINOPHEN 500 MG PO TABS: 1000.0000 mg | ORAL_TABLET | Freq: Four times a day (QID) | ORAL | Status: DC

## 2022-01-04 MED ORDER — SODIUM CHLORIDE (PF) 0.9 % IJ SOLN
INTRAMUSCULAR | Status: DC | PRN
  Administered 2022-01-04: 10 mL
  Administered 2022-01-04: 50 mL

## 2022-01-04 MED ORDER — ISOPROPYL ALCOHOL 70 % SOLN
Status: AC
  Filled 2022-01-04: qty 480

## 2022-01-04 MED ORDER — POVIDONE-IODINE 10 % EX SWAB: 2.0000 | Freq: Once | CUTANEOUS | Status: DC

## 2022-01-04 MED ORDER — METHOCARBAMOL 500 MG PO TABS: 500.0000 mg | ORAL_TABLET | Freq: Three times a day (TID) | ORAL | 0 refills | Status: AC | PRN

## 2022-01-04 MED ORDER — ONDANSETRON HCL 4 MG/2ML IJ SOLN
INTRAMUSCULAR | Status: DC | PRN
  Administered 2022-01-04: 4 mg via INTRAVENOUS

## 2022-01-04 MED ORDER — METHOCARBAMOL 500 MG IVPB - SIMPLE MED
INTRAVENOUS | Status: AC
  Administered 2022-01-04: 500 mg via INTRAVENOUS
  Filled 2022-01-04: qty 55

## 2022-01-04 MED ORDER — KETOROLAC TROMETHAMINE 15 MG/ML IJ SOLN
INTRAMUSCULAR | Status: AC
  Administered 2022-01-04: 7.5 mg via INTRAVENOUS
  Filled 2022-01-04: qty 1

## 2022-01-04 MED ORDER — OXYCODONE HCL 5 MG/5ML PO SOLN: 5.0000 mg | Freq: Once | ORAL | Status: AC | PRN

## 2022-01-04 MED ORDER — FENTANYL CITRATE (PF) 100 MCG/2ML IJ SOLN
INTRAMUSCULAR | Status: DC | PRN
  Administered 2022-01-04: 50 ug via INTRAVENOUS

## 2022-01-04 MED ORDER — MIDAZOLAM HCL 5 MG/5ML IJ SOLN
INTRAMUSCULAR | Status: DC | PRN
  Administered 2022-01-04: 2 mg via INTRAVENOUS

## 2022-01-04 MED ORDER — SODIUM CHLORIDE 0.9 % IR SOLN
Status: DC | PRN
  Administered 2022-01-04: 1000 mL
  Administered 2022-01-04: 3000 mL

## 2022-01-04 MED ORDER — CEFAZOLIN SODIUM-DEXTROSE 2-4 GM/100ML-% IV SOLN
INTRAVENOUS | Status: AC
  Filled 2022-01-04: qty 100

## 2022-01-04 MED ORDER — TRANEXAMIC ACID-NACL 1000-0.7 MG/100ML-% IV SOLN
1000.0000 mg | INTRAVENOUS | Status: AC
  Administered 2022-01-04: 1000 mg via INTRAVENOUS
  Filled 2022-01-04: qty 100

## 2022-01-04 MED ORDER — ONDANSETRON HCL 4 MG/2ML IJ SOLN
4.0000 mg | Freq: Four times a day (QID) | INTRAMUSCULAR | Status: DC | PRN
  Administered 2022-01-04: 4 mg via INTRAVENOUS

## 2022-01-04 MED ORDER — 0.9 % SODIUM CHLORIDE (POUR BTL) OPTIME
TOPICAL | Status: DC | PRN
  Administered 2022-01-04: 1000 mL

## 2022-01-04 MED ORDER — OXYCODONE HCL 5 MG PO TABS: 5.0000 mg | ORAL_TABLET | ORAL | Status: DC | PRN

## 2022-01-04 MED ORDER — BUPIVACAINE LIPOSOME 1.3 % IJ SUSP: 10.0000 mL | Freq: Once | INTRAMUSCULAR | Status: DC

## 2022-01-04 MED ORDER — OXYCODONE HCL 5 MG PO TABS
5.0000 mg | ORAL_TABLET | Freq: Once | ORAL | Status: AC | PRN
  Administered 2022-01-04: 5 mg via ORAL

## 2022-01-04 MED ORDER — ACETAMINOPHEN 500 MG PO TABS
1000.0000 mg | ORAL_TABLET | Freq: Once | ORAL | Status: AC
  Administered 2022-01-04: 1000 mg via ORAL
  Filled 2022-01-04: qty 2

## 2022-01-04 MED ORDER — EPHEDRINE SULFATE-NACL 50-0.9 MG/10ML-% IV SOSY
PREFILLED_SYRINGE | INTRAVENOUS | Status: DC | PRN
  Administered 2022-01-04: 10 mg via INTRAVENOUS
  Administered 2022-01-04 (×2): 7.5 mg via INTRAVENOUS

## 2022-01-04 MED ORDER — ASPIRIN 81 MG PO TBEC: 81.0000 mg | DELAYED_RELEASE_TABLET | Freq: Two times a day (BID) | ORAL | 0 refills | Status: AC

## 2022-01-04 MED ORDER — AMISULPRIDE (ANTIEMETIC) 5 MG/2ML IV SOLN: 10.0000 mg | Freq: Once | INTRAVENOUS | Status: DC | PRN

## 2022-01-04 MED ORDER — BUPIVACAINE LIPOSOME 1.3 % IJ SUSP
INTRAMUSCULAR | Status: AC
  Filled 2022-01-04: qty 20

## 2022-01-04 MED ORDER — WATER FOR IRRIGATION, STERILE IR SOLN
Status: DC | PRN
  Administered 2022-01-04 (×2): 1000 mL

## 2022-01-04 MED ORDER — BUPIVACAINE HCL (PF) 0.5 % IJ SOLN
INTRAMUSCULAR | Status: DC | PRN
  Administered 2022-01-04: 3 mL via INTRATHECAL

## 2022-01-04 MED ORDER — PROPOFOL 10 MG/ML IV BOLUS
INTRAVENOUS | Status: DC | PRN
  Administered 2022-01-04 (×2): 20 mg via INTRAVENOUS

## 2022-01-04 MED ORDER — FENTANYL CITRATE (PF) 100 MCG/2ML IJ SOLN
INTRAMUSCULAR | Status: AC
  Filled 2022-01-04: qty 2

## 2022-01-04 MED ORDER — METHOCARBAMOL 500 MG IVPB - SIMPLE MED: 500.0000 mg | Freq: Four times a day (QID) | INTRAVENOUS | Status: DC | PRN

## 2022-01-04 MED ORDER — POVIDONE-IODINE 7.5 % EX SOLN: Freq: Once | CUTANEOUS | Status: DC

## 2022-01-04 MED ORDER — ACETAMINOPHEN 500 MG PO TABS: 1000.0000 mg | ORAL_TABLET | Freq: Three times a day (TID) | ORAL | 0 refills | Status: AC | PRN

## 2022-01-04 MED ORDER — DEXAMETHASONE SODIUM PHOSPHATE 10 MG/ML IJ SOLN
8.0000 mg | Freq: Once | INTRAMUSCULAR | Status: AC
  Administered 2022-01-04: 4 mg via INTRAVENOUS

## 2022-01-04 MED ORDER — PHENYLEPHRINE 80 MCG/ML (10ML) SYRINGE FOR IV PUSH (FOR BLOOD PRESSURE SUPPORT)
PREFILLED_SYRINGE | INTRAVENOUS | Status: DC | PRN
  Administered 2022-01-04: 40 ug via INTRAVENOUS

## 2022-01-04 MED ORDER — PROPOFOL 1000 MG/100ML IV EMUL
INTRAVENOUS | Status: AC
  Filled 2022-01-04: qty 100

## 2022-01-04 MED ORDER — FENTANYL CITRATE PF 50 MCG/ML IJ SOSY
PREFILLED_SYRINGE | INTRAMUSCULAR | Status: AC
  Administered 2022-01-04: 25 ug via INTRAVENOUS
  Filled 2022-01-04: qty 1

## 2022-01-04 MED ORDER — IBUPROFEN 800 MG PO TABS: 800.0000 mg | ORAL_TABLET | Freq: Three times a day (TID) | ORAL | 0 refills | Status: DC | PRN

## 2022-01-04 MED ORDER — CEFAZOLIN SODIUM-DEXTROSE 2-4 GM/100ML-% IV SOLN
2.0000 g | Freq: Four times a day (QID) | INTRAVENOUS | Status: DC
  Administered 2022-01-04: 2 g via INTRAVENOUS

## 2022-01-04 MED ORDER — ONDANSETRON HCL 4 MG PO TABS: 4.0000 mg | ORAL_TABLET | Freq: Three times a day (TID) | ORAL | 0 refills | Status: AC | PRN

## 2022-01-04 MED ORDER — CHLORHEXIDINE GLUCONATE 0.12 % MT SOLN
15.0000 mL | Freq: Once | OROMUCOSAL | Status: AC
  Administered 2022-01-04: 15 mL via OROMUCOSAL

## 2022-01-04 MED ORDER — MELOXICAM 15 MG PO TABS
15.0000 mg | ORAL_TABLET | Freq: Once | ORAL | Status: DC
  Filled 2022-01-04: qty 1

## 2022-01-04 MED ORDER — OXYCODONE HCL 5 MG PO TABS
ORAL_TABLET | ORAL | Status: AC
  Filled 2022-01-04: qty 1

## 2022-01-04 MED ORDER — SODIUM CHLORIDE (PF) 0.9 % IJ SOLN
INTRAMUSCULAR | Status: AC
  Filled 2022-01-04: qty 50

## 2022-01-04 MED ORDER — KETOROLAC TROMETHAMINE 15 MG/ML IJ SOLN: 7.5000 mg | Freq: Four times a day (QID) | INTRAMUSCULAR | Status: DC

## 2022-01-04 MED ORDER — BUPIVACAINE LIPOSOME 1.3 % IJ SUSP
INTRAMUSCULAR | Status: DC | PRN
  Administered 2022-01-04: 20 mL

## 2022-01-04 MED ORDER — METHOCARBAMOL 500 MG PO TABS: 500.0000 mg | ORAL_TABLET | Freq: Four times a day (QID) | ORAL | Status: DC | PRN

## 2022-01-04 MED ORDER — LACTATED RINGERS IV SOLN: INTRAVENOUS | Status: DC

## 2022-01-04 MED ORDER — OXYCODONE HCL 5 MG PO TABS: 5.0000 mg | ORAL_TABLET | ORAL | 0 refills | Status: AC | PRN

## 2022-01-04 MED ORDER — ORAL CARE MOUTH RINSE: 15.0000 mL | Freq: Once | OROMUCOSAL | Status: AC

## 2022-01-04 MED ORDER — PROPOFOL 500 MG/50ML IV EMUL
INTRAVENOUS | Status: DC | PRN
  Administered 2022-01-04: 50 ug/kg/min via INTRAVENOUS

## 2022-01-04 MED ORDER — CEFAZOLIN SODIUM-DEXTROSE 2-4 GM/100ML-% IV SOLN
2.0000 g | INTRAVENOUS | Status: AC
  Administered 2022-01-04: 2 g via INTRAVENOUS
  Filled 2022-01-04: qty 100

## 2022-01-04 MED ORDER — LACTATED RINGERS IV BOLUS
500.0000 mL | Freq: Once | INTRAVENOUS | Status: AC
  Administered 2022-01-04: 500 mL via INTRAVENOUS

## 2022-01-04 MED ORDER — DEXAMETHASONE SODIUM PHOSPHATE 10 MG/ML IJ SOLN
INTRAMUSCULAR | Status: AC
  Filled 2022-01-04: qty 1

## 2022-01-04 SURGICAL SUPPLY — 65 items
BAG COUNTER SPONGE SURGICOUNT (BAG) IMPLANT
BAG DECANTER FOR FLEXI CONT (MISCELLANEOUS) ×1 IMPLANT
BAG ZIPLOCK 12X15 (MISCELLANEOUS) ×1 IMPLANT
BLADE SAW SAG 25X90X1.19 (BLADE) ×1 IMPLANT
CHLORAPREP W/TINT 26 (MISCELLANEOUS) ×2 IMPLANT
COVER SURGICAL LIGHT HANDLE (MISCELLANEOUS) ×1 IMPLANT
DERMABOND ADVANCED .7 DNX12 (GAUZE/BANDAGES/DRESSINGS) ×1 IMPLANT
DRAPE HIP W/POCKET STRL (MISCELLANEOUS) ×1 IMPLANT
DRAPE INCISE IOBAN 66X45 STRL (DRAPES) ×1 IMPLANT
DRAPE INCISE IOBAN 85X60 (DRAPES) ×1 IMPLANT
DRAPE POUCH INSTRU U-SHP 10X18 (DRAPES) ×1 IMPLANT
DRAPE SHEET LG 3/4 BI-LAMINATE (DRAPES) ×3 IMPLANT
DRAPE SURG 17X11 SM STRL (DRAPES) ×1 IMPLANT
DRAPE U-SHAPE 47X51 STRL (DRAPES) ×2 IMPLANT
DRESSING AQUACEL AG SP 3.5X10 (GAUZE/BANDAGES/DRESSINGS) ×1 IMPLANT
DRSG AQUACEL AG SP 3.5X10 (GAUZE/BANDAGES/DRESSINGS) ×1
ELECT BLADE TIP CTD 4 INCH (ELECTRODE) ×1 IMPLANT
ELECT REM PT RETURN 15FT ADLT (MISCELLANEOUS) ×1 IMPLANT
GLOVE BIO SURGEON STRL SZ7.5 (GLOVE) ×2 IMPLANT
GLOVE BIOGEL PI IND STRL 7.5 (GLOVE) ×1 IMPLANT
GLOVE BIOGEL PI IND STRL 8 (GLOVE) ×1 IMPLANT
GLOVE SURG ORTHO 8.0 STRL STRW (GLOVE) ×2 IMPLANT
GOWN STRL REUS W/ TWL XL LVL3 (GOWN DISPOSABLE) ×2 IMPLANT
GOWN STRL REUS W/TWL XL LVL3 (GOWN DISPOSABLE) ×2
HANDPIECE INTERPULSE COAX TIP (DISPOSABLE)
HEAD BIOLOX HIP 36/-2.5 (Joint) IMPLANT
HIP BIOLOX HD 36/-2.5 (Joint) ×1 IMPLANT
HOLDER FOLEY CATH W/STRAP (MISCELLANEOUS) ×1 IMPLANT
HOOD PEEL AWAY T7 (MISCELLANEOUS) ×3 IMPLANT
INSERT 0 DEGREE 36 (Miscellaneous) IMPLANT
JET LAVAGE IRRISEPT WOUND (IRRIGATION / IRRIGATOR)
KIT BASIN OR (CUSTOM PROCEDURE TRAY) ×1 IMPLANT
KIT TURNOVER KIT A (KITS) IMPLANT
LAVAGE JET IRRISEPT WOUND (IRRIGATION / IRRIGATOR) IMPLANT
MANIFOLD NEPTUNE II (INSTRUMENTS) ×1 IMPLANT
MARKER SKIN DUAL TIP RULER LAB (MISCELLANEOUS) ×1 IMPLANT
NEEDLE HYPO 22GX1.5 SAFETY (NEEDLE) IMPLANT
NS IRRIG 1000ML POUR BTL (IV SOLUTION) ×1 IMPLANT
PACK TOTAL JOINT (CUSTOM PROCEDURE TRAY) ×1 IMPLANT
PRESSURIZER FEMORAL UNIV (MISCELLANEOUS) IMPLANT
PROTECTOR NERVE ULNAR (MISCELLANEOUS) ×1 IMPLANT
RETRIEVER SUT HEWSON (MISCELLANEOUS) ×1 IMPLANT
SCREW HEX LP 6.5X15 (Screw) IMPLANT
SCREW HEX LP 6.5X35 (Screw) IMPLANT
SEALER BIPOLAR AQUA 6.0 (INSTRUMENTS) ×1 IMPLANT
SET HNDPC FAN SPRY TIP SCT (DISPOSABLE) IMPLANT
SHELL ACETAB TRIDENT 48 (Shell) IMPLANT
SPIKE FLUID TRANSFER (MISCELLANEOUS) ×3 IMPLANT
STEM HIP 4 127DEG (Stem) IMPLANT
SUCTION FRAZIER HANDLE 12FR (TUBING) ×1
SUCTION TUBE FRAZIER 12FR DISP (TUBING) ×1 IMPLANT
SUT BONE WAX W31G (SUTURE) ×1 IMPLANT
SUT ETHIBOND #5 BRAIDED 30INL (SUTURE) ×1 IMPLANT
SUT MNCRL AB 3-0 PS2 18 (SUTURE) ×1 IMPLANT
SUT STRATAFIX 0 PDS 27 VIOLET (SUTURE) ×1
SUT STRATAFIX PDO 1 14 VIOLET (SUTURE) ×1
SUT STRATFX PDO 1 14 VIOLET (SUTURE) ×1
SUT VIC AB 2-0 CT2 27 (SUTURE) ×2 IMPLANT
SUTURE STRATFX 0 PDS 27 VIOLET (SUTURE) ×1 IMPLANT
SUTURE STRATFX PDO 1 14 VIOLET (SUTURE) ×1 IMPLANT
SYR 20ML LL LF (SYRINGE) ×2 IMPLANT
TOWEL OR 17X26 10 PK STRL BLUE (TOWEL DISPOSABLE) ×1 IMPLANT
TRAY FOLEY MTR SLVR 16FR STAT (SET/KITS/TRAYS/PACK) ×1 IMPLANT
UNDERPAD 30X36 HEAVY ABSORB (UNDERPADS AND DIAPERS) ×1 IMPLANT
WATER STERILE IRR 1000ML POUR (IV SOLUTION) ×2 IMPLANT

## 2022-01-04 NOTE — Op Note (Signed)
01/04/2022  9:06 AM  PATIENT:  Patricia Ryan   MRN: [id number]  PRE-OPERATIVE DIAGNOSIS: Right hip avascular necrosis POST-OPERATIVE DIAGNOSIS:  same  PROCEDURE:  Procedure(s): TOTAL HIP ARTHROPLASTY  PREOPERATIVE INDICATIONS:    Patricia Ryan is an 66 y.o. female who has a diagnosis of right hip avascular necrosis and elected for surgical management after failing conservative treatment.  The risks benefits and alternatives were discussed with the patient including but not limited to the risks of nonoperative treatment, versus surgical intervention including infection, bleeding, nerve injury, periprosthetic fracture, the need for revision surgery, dislocation, leg length discrepancy, blood clots, cardiopulmonary complications, morbidity, mortality, among others, and they were willing to proceed.     OPERATIVE REPORT     SURGEON:  Charlies Constable, MD    ASSISTANT: Izola Price, RNFA, (Present throughout the entire procedure,  necessary for completion of procedure in a timely manner, assisting with retraction, instrumentation, and closure)     ANESTHESIA: Spinal  ESTIMATED BLOOD LOSS: 527PO    COMPLICATIONS:  None.    COMPONENTS:   Stryker 48 mm Trident 2 acetabular cluster hole shell, 6.5 hex screws x 2, Accolade 2 size 4 with 127 degree neck angle, 36-2.38m femoral head Implant Name Type Inv. Item Serial No. Manufacturer Lot No. LRB No. Used Action  SHELL ACETAB TRIDENT 48 - [id number] Shell SHELL ACETAB TRIDENT 48  [employer] [id number] Right 1 Implanted  SCREW HEX LP 6.5X35 - [id number] SCREW HEX LP 6.5X35  [employer] FRL Right 1 Implanted  SCREW HEX LP 6.5X15 - [id number] SCREW HEX LP 6.5X15  [employer] FGTH Right 1 Implanted  INSERT 0 DEGREE 36 - [id number] INSERT 0 DEGREE 36  [employer] [id number] Right 1 Implanted  STEM HIP 4 127DEG - [id number] STEM HIP 4 127DEG  [employer] [id number] 1  Implanted  HIP BIOLOX HD 36/-2.5 - [id number] HIP BIOLOX HD 36/-2.5  [employer]  Right 1 Implanted      PROCEDURE IN DETAIL:   The patient was met in the holding area and  identified.  The appropriate hip was identified and marked at the operative site.  The patient was then transported to the OR  and  placed under anesthesia.  At that point, the patient was  placed in the lateral decubitus position with the operative side up and  secured to the operating room table  and all bony prominences padded. A subaxillary role was also placed.    The operative lower extremity was prepped from the iliac crest to the distal leg.  Sterile draping was performed.  Preoperative antibiotics, 2 gm of ancef,1 gm of Tranexamic Acid, and 8 mg of Decadron administered. Time out was performed prior to incision.      A routine posterolateral approach was utilized via sharp dissection  carried down to the subcutaneous tissue.  Gross bleeders were Bovie coagulated.  The iliotibial band was identified and incised along the length of the skin incision through the glute max fascia.  Charnley retractor was placed with care to protect the sciatic nerve posteriorly.  With the hip internally rotated, the piriformis tendon was identified and released from the femoral insertion and tagged with a #5 Ethibond.  A capsulotomy was then performed off the femoral insertion and also tagged with a #5 Ethibond.    The femoral neck was exposed, and I resected the femoral neck based on preoperative templating relative to the lesser trochanter.    I then  exposed the deep acetabulum, cleared out any tissue including the ligamentum teres.  After adequate visualization, I excised the labrum.  I then started reaming with a 44 mm reamer, first medializing to the floor of the cotyloid fossa, and then in the position of the cup aiming towards the greater sciatic notch, matching the version of the transverse acetabular ligament and tucked  under the anterior wall. I reamed up to 48 mm reamer with good bony bed preparation and a 48 mm cup was chosen.  The real cup was then impacted into place.  Appropriate version and inclination was confirmed clinically matching their bony anatomy, and also with the use of the jig.  I placed 2 screws in the posterior superior quadrant to augment fixation.  A neutral liner was placed and impacted. It was confirmed to be appropriately seated and the acetabular retractors were removed.    I then prepared the proximal femur using the box cutter, Charnley awl, and then sequentially broached starting with 0 up to a size 3.  A trial broach, neck, and head was utilized, and I reduced the hip and it was found to have excellent stability.  There was no impingement with full extension and 90 degrees external rotation.  The hip was stable at the position of sleep and with 90 degrees flexion and 90 degrees of internal rotation.  Leg lengths were also clinically assessed in the lateral position and felt to be equal. Intra-Op flatplate was obtained and confirmed appropriate component positions.  Good fell overall with a size 3 broach but felt that we could still work up to a #4.Marland Kitchen  And restoration of leg length and offset. No evidence or concern for fracture.  Broached again now getting a size 4 nicely seated and very stable.  Again trialed and felt that we had excellent stability with a -2.5 head ball.  A final femoral prosthesis size 4 was selected. I then impacted the real femoral prosthesis into place.I again trialed and selected a 36 -2.5 mm ball. The hip was then reduced and taken through a range of motion. There was no impingement with full extension and 90 degrees external rotation.  The hip was stable at the position of sleep and with 90 degrees flexion and 90 degrees of internal rotation. Leg lengths were  again assessed and felt to be restored.  We then opened, and I impacted the real head ball into place.  The  posterior capsule was then closed with #5 Ethibond.  The piriformis was repaired through the base of the abductor tendon using a [place] suture passer.  I then irrigated the hip copiously with dilute Betadine and with normal saline pulse lavage. Periarticular injection was then performed with Exparel.   We repaired the fascia #1 barbed suture, followed by 0 barbed suture for the subcutaneous fat.  Skin was closed with 2-0 Vicryl and 3-0 Monocryl.  Dermabond and Aquacel dressing were applied. The patient was then awakened and returned to PACU in stable and satisfactory condition.  Leg lengths in the supine position were assessed and felt to be clinically equal. There were no complications.  Post op recs: WB: WBAT RLE, No formal hip precautions Abx: ancef Imaging: PACU pelvis Xray Dressing: Aquacell, keep intact until follow up DVT prophylaxis: Aspirin 81BID starting POD1 Follow up: 2 weeks after surgery for a wound check with Dr. Zachery Dakins at [facility].  Address: [address] [place], [place], [place] [address]  Office Phone: [phone]  Charlies Constable, MD Orthopedic Surgeon

## 2022-01-04 NOTE — Evaluation (Signed)
Physical Therapy Evaluation Patient Details Name: Patricia Ryan MRN: [id number] DOB: 1955/11/15 Today's Date: 01/04/2022  History of Present Illness  Pt is a 66yo female presenting s/p R-THA, posterior approach on 01/04/22. PMH: GERD, HLD, IBS.  Clinical Impression  Patricia Ryan is a 66 y.o. female POD 0 s/p R-THA, posterior approach without formal hip precautions. Patient reports independence with mobility at baseline. Patient is now limited by functional impairments (see PT problem list below) and requires min guard for transfers and gait with RW. Patient was able to ambulate 80 feet with RW and min guard and cues for safe walker management. Patient educated on safe sequencing for stair mobility and verbalized safe guarding position for people assisting with mobility. Patient instructed in exercises to facilitate ROM and circulation. Patient will benefit from continued skilled PT interventions to address impairments and progress towards PLOF. Patient has met mobility goals at adequate level for discharge home; will continue to follow if pt continues acute stay to progress towards Mod I goals.       Recommendations for follow up therapy are one component of a multi-disciplinary discharge planning process, led by the attending physician.  Recommendations may be updated based on patient status, additional functional criteria and insurance authorization.  Follow Up Recommendations Follow physician's recommendations for discharge plan and follow up therapies      Assistance Recommended at Discharge Frequent or constant Supervision/Assistance  Patient can return home with the following  A little help with walking and/or transfers;A little help with bathing/dressing/bathroom;Assistance with cooking/housework;Assist for transportation;Help with stairs or ramp for entrance    Equipment Recommendations Rolling walker (2 wheels)  Recommendations for Other Services       Functional Status  Assessment Patient has had a recent decline in their functional status and demonstrates the ability to make significant improvements in function in a reasonable and predictable amount of time.     Precautions / Restrictions Precautions Precautions: Fall Precaution Comments: no formal posterior precautions, WBAT Restrictions Weight Bearing Restrictions: No Other Position/Activity Restrictions: wbat      Mobility  Bed Mobility Overal bed mobility: Needs Assistance Bed Mobility: Supine to Sit     Supine to sit: Min guard     General bed mobility comments: For safety only, no physical assist required, VCs for sequencing. EDucation provided on use of gait belt to self-assist    Transfers Overall transfer level: Needs assistance Equipment used: Rolling walker (2 wheels) Transfers: Sit to/from Stand Sit to Stand: Min guard, From elevated surface           General transfer comment: For safety only, VCs for sequencing and use of hands to push off bed    Ambulation/Gait Ambulation/Gait assistance: Min guard Gait Distance (Feet): 80 Feet Assistive device: Rolling walker (2 wheels) Gait Pattern/deviations: Step-to pattern Gait velocity: decreased     General Gait Details: pt ambulated with RW and min guard, no physical assist required or overt LOB noted.  Stairs Stairs: Yes Stairs assistance: Min guard Stair Management: One rail Right, Step to pattern, Sideways Number of Stairs: 2 General stair comments: Educated pt on safe stair mobility, pt verbalized understanding. Encouraged use of straight chair at home for resting prior to/after stair mobility. Pt completed stair mobility sideways with RW and min guard, VCs for sequencing, no overt LOb noted or physical assist required.  Wheelchair Mobility    Modified Rankin (Stroke Patients Only)       Balance Overall balance assessment: Needs assistance Sitting-balance support: Feet  supported, No upper extremity  supported Sitting balance-Leahy Scale: Good     Standing balance support: Reliant on assistive device for balance, During functional activity, Bilateral upper extremity supported Standing balance-Leahy Scale: Poor                               Pertinent Vitals/Pain Pain Assessment Pain Assessment: 0-10 Pain Score: 5  Pain Location: left hip and right low back Pain Descriptors / Indicators: Operative site guarding Pain Intervention(s): Limited activity within patient's tolerance, Monitored during session, Repositioned    Home Living Family/patient expects to be discharged to:: Private residence Living Arrangements: Spouse/significant other Available Help at Discharge: Family;Available 24 hours/day Type of Home: House Home Access: Stairs to enter Entrance Stairs-Rails: Psychiatric nurse of Steps: 5 Alternate Level Stairs-Number of Steps: 13 Home Layout: Two level Home Equipment: Cane - single point      Prior Function Prior Level of Function : Independent/Modified Independent             Mobility Comments: IND ADLs Comments: IND     Hand Dominance        Extremity/Trunk Assessment   Upper Extremity Assessment Upper Extremity Assessment: Overall WFL for tasks assessed    Lower Extremity Assessment Lower Extremity Assessment: RLE deficits/detail;LLE deficits/detail RLE Deficits / Details: MMT ank DF/PF 5/5 RLE Sensation: WNL LLE Deficits / Details: MMT ank DF/PF 5/5 LLE Sensation: WNL    Cervical / Trunk Assessment Cervical / Trunk Assessment: Normal  Communication   Communication: No difficulties  Cognition Arousal/Alertness: Awake/alert Behavior During Therapy: WFL for tasks assessed/performed Overall Cognitive Status: Within Functional Limits for tasks assessed                                          General Comments General comments (skin integrity, edema, etc.): Husband present    Exercises Total  Joint Exercises Ankle Circles/Pumps: AROM, Both, 20 reps Quad Sets: AROM, Right, Other reps (comment) (2) Short Arc Quad: AROM, Right, Other reps (comment) (2) Heel Slides: AROM, Right, Other reps (comment) (2) Hip ABduction/ADduction: AROM, Right, Other reps (comment) (2)   Assessment/Plan    PT Assessment Patient needs continued PT services  PT Problem List Decreased strength;Decreased range of motion;Decreased activity tolerance;Decreased balance;Decreased mobility;Decreased coordination;Pain       PT Treatment Interventions DME instruction;Gait training;Stair training;Functional mobility training;Therapeutic activities;Therapeutic exercise;Balance training;Neuromuscular re-education;Patient/family education    PT Goals (Current goals can be found in the Care Plan section)  Acute Rehab PT Goals Patient Stated Goal: To go home PT Goal Formulation: With patient Time For Goal Achievement: 01/11/22 Potential to Achieve Goals: Good    Frequency 7X/week     Co-evaluation               AM-PAC PT "6 Clicks" Mobility  Outcome Measure Help needed turning from your back to your side while in a flat bed without using bedrails?: None Help needed moving from lying on your back to sitting on the side of a flat bed without using bedrails?: A Little Help needed moving to and from a bed to a chair (including a wheelchair)?: A Little Help needed standing up from a chair using your arms (e.g., wheelchair or bedside chair)?: A Little Help needed to walk in hospital room?: A Little Help needed climbing 3-5 steps with a railing? :  A Little 6 Click Score: 19    End of Session Equipment Utilized During Treatment: Gait belt Activity Tolerance: Patient tolerated treatment well;No increased pain Patient left: in chair;with call bell/phone within reach;with family/visitor present Nurse Communication: Mobility status PT Visit Diagnosis: Pain;Difficulty in walking, not elsewhere classified  (R26.2) Pain - Right/Left: Right Pain - part of body: Hip    Time: 1250-1330 PT Time Calculation (min) (ACUTE ONLY): 40 min   Charges:   PT Evaluation $PT Eval Low Complexity: 1 Low PT Treatments $Gait Training: 8-22 mins $Therapeutic Exercise: 8-22 mins        Coolidge Breeze, PT, DPT [facility] Department Office: [phone] Weekend pager: [phone]  Coolidge Breeze 01/04/2022, 1:40 PM

## 2022-01-04 NOTE — Anesthesia Procedure Notes (Signed)
Spinal  Patient location during procedure: OR Start time: 01/04/2022 7:25 AM End time: 01/04/2022 7:28 AM Reason for block: surgical anesthesia Staffing Performed: anesthesiologist  Anesthesiologist: Brennan Bailey, MD Performed by: Brennan Bailey, MD Authorized by: Brennan Bailey, MD   Preanesthetic Checklist Completed: patient identified, IV checked, risks and benefits discussed, surgical consent, monitors and equipment checked, pre-op evaluation and timeout performed Spinal Block Patient position: sitting Prep: DuraPrep and site prepped and draped Patient monitoring: continuous pulse ox, blood pressure and heart rate Approach: midline Location: L3-4 Injection technique: single-shot Needle Needle type: Pencan  Needle gauge: 24 G Needle length: 9 cm Assessment Events: CSF return Additional Notes Risks, benefits, and alternative discussed. Patient gave consent to procedure. Prepped and draped in sitting position. Patient sedated but responsive to voice. Clear CSF obtained after one needle pass. Positive terminal aspiration. No pain or paraesthesias with injection. Patient tolerated procedure well. Vital signs stable. Tawny Asal, MD

## 2022-01-04 NOTE — Anesthesia Procedure Notes (Addendum)
Procedure Name: MAC Date/Time: 01/04/2022 7:22 AM  Performed by: West Pugh, CRNAPre-anesthesia Checklist: Patient identified, Emergency Drugs available, Suction available, Patient being monitored and Timeout performed Patient Re-evaluated:Patient Re-evaluated prior to induction Oxygen Delivery Method: Simple face mask Preoxygenation: Pre-oxygenation with 100% oxygen Placement Confirmation: positive ETCO2 Dental Injury: Teeth and Oropharynx as per pre-operative assessment

## 2022-01-04 NOTE — Discharge Instructions (Signed)

## 2022-01-04 NOTE — Interval H&P Note (Signed)
The patient has been re-examined, and the chart reviewed, and there have been no interval changes to the documented history and physical.    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   The risks, benefits, and alternatives have been discussed at length with patient, and the patient is willing to proceed.  Right hip marked. Consent has been signed.

## 2022-01-04 NOTE — Anesthesia Postprocedure Evaluation (Signed)
Anesthesia Post Note  Patient: Patricia Ryan  Procedure(s) Performed: TOTAL HIP ARTHROPLASTY (Right: Hip)     Patient location during evaluation: PACU Anesthesia Type: Spinal Level of consciousness: awake and alert Pain management: pain level controlled Vital Signs Assessment: post-procedure vital signs reviewed and stable Respiratory status: spontaneous breathing, nonlabored ventilation and respiratory function stable Cardiovascular status: blood pressure returned to baseline Postop Assessment: no apparent nausea or vomiting, spinal receding, no headache and no backache Anesthetic complications: no   No notable events documented.  Last Vitals:  Vitals:   01/04/22 1100 01/04/22 1115  BP: 126/61 130/68  Pulse: 74 81  Resp: 14 (!) 22  Temp: 36.6 C   SpO2: 96% 100%    Last Pain:  Vitals:   01/04/22 1100  TempSrc:   PainSc: 0-No pain                 Marthenia Rolling

## 2022-01-04 NOTE — Transfer of Care (Signed)
Immediate Anesthesia Transfer of Care Note  Patient: Patricia Ryan  Procedure(s) Performed: TOTAL HIP ARTHROPLASTY (Right: Hip)  Patient Location: PACU  Anesthesia Type:MAC and Spinal  Level of Consciousness: awake, alert , oriented, and patient cooperative  Airway & Oxygen Therapy: Patient Spontanous Breathing and Patient connected to nasal cannula oxygen  Post-op Assessment: Report given to RN and Post -op Vital signs reviewed and stable  Post vital signs: Reviewed and stable  Last Vitals:  Vitals Value Taken Time  BP 105/58 01/04/22 0919  Temp    Pulse 72 01/04/22 0922  Resp 17 01/04/22 0922  SpO2 100 % 01/04/22 0922  Vitals shown include unvalidated device data.  Last Pain:  Vitals:   01/04/22 0617  TempSrc:   PainSc: 8          Complications: No notable events documented.

## 2022-01-07 ENCOUNTER — Encounter (HOSPITAL_COMMUNITY): Payer: Self-pay | Admitting: Orthopedic Surgery

## 2022-01-11 ENCOUNTER — Other Ambulatory Visit: Payer: Self-pay | Admitting: Nurse Practitioner

## 2022-01-11 DIAGNOSIS — G4700 Insomnia, unspecified: Secondary | ICD-10-CM

## 2022-01-29 ENCOUNTER — Other Ambulatory Visit (HOSPITAL_BASED_OUTPATIENT_CLINIC_OR_DEPARTMENT_OTHER): Payer: Self-pay | Admitting: Obstetrics & Gynecology

## 2022-01-29 DIAGNOSIS — B009 Herpesviral infection, unspecified: Secondary | ICD-10-CM

## 2022-02-08 ENCOUNTER — Other Ambulatory Visit (HOSPITAL_COMMUNITY)
Admission: RE | Admit: 2022-02-08 | Discharge: 2022-02-08 | Disposition: A | Discharge status: Home / Self Care | Payer: 59 | Source: Ambulatory Visit | Attending: Obstetrics & Gynecology | Admitting: Obstetrics & Gynecology

## 2022-02-08 ENCOUNTER — Ambulatory Visit (INDEPENDENT_AMBULATORY_CARE_PROVIDER_SITE_OTHER): Payer: 59 | Admitting: Obstetrics & Gynecology

## 2022-02-08 ENCOUNTER — Encounter (HOSPITAL_BASED_OUTPATIENT_CLINIC_OR_DEPARTMENT_OTHER): Payer: Self-pay | Admitting: Obstetrics & Gynecology

## 2022-02-08 VITALS — BP 131/65 | HR 69 | Ht 63.0 in | Wt 109.6 lb

## 2022-02-08 DIAGNOSIS — B009 Herpesviral infection, unspecified: Secondary | ICD-10-CM | POA: Diagnosis not present

## 2022-02-08 DIAGNOSIS — Z1151 Encounter for screening for human papillomavirus (HPV): Secondary | ICD-10-CM | POA: Diagnosis present

## 2022-02-08 DIAGNOSIS — Z79890 Hormone replacement therapy (postmenopausal): Secondary | ICD-10-CM | POA: Diagnosis not present

## 2022-02-08 DIAGNOSIS — Z01419 Encounter for gynecological examination (general) (routine) without abnormal findings: Secondary | ICD-10-CM

## 2022-02-08 DIAGNOSIS — B977 Papillomavirus as the cause of diseases classified elsewhere: Secondary | ICD-10-CM

## 2022-02-08 DIAGNOSIS — N390 Urinary tract infection, site not specified: Secondary | ICD-10-CM

## 2022-02-08 MED ORDER — ESTRADIOL 0.5 MG PO TABS: 0.2500 mg | ORAL_TABLET | ORAL | 3 refills | Status: DC

## 2022-02-08 MED ORDER — MEDROXYPROGESTERONE ACETATE 2.5 MG PO TABS: 1.2500 mg | ORAL_TABLET | ORAL | 3 refills | Status: DC

## 2022-02-08 MED ORDER — VALACYCLOVIR HCL 500 MG PO TABS: 500.0000 mg | ORAL_TABLET | Freq: Every day | ORAL | 3 refills | Status: DC

## 2022-02-08 NOTE — Progress Notes (Signed)
67 y.o. G2P2 Married White or Caucasian female here for annual exam.  Doing well.  Had hip surgery in December.  Has done well.   Denies vaginal bleeding.    She is on HRT.  She stopped the just before surgery and is back on her current dosage.    Using estradiol cream as recommended by urology.  Has not had a UTI since started using the vaginal estrogen cream  Patient's last menstrual period was 01/20/2011.          Sexually active: Yes.   Not currently due to recent hip surgery.  The current method of family planning is post menopausal status.    Exercising: doing PT right now with recent hip surgery Smoker:  no  Health Maintenance: Pap:  01/29/2021 Positive HPV History of abnormal Pap:  yes MMG:  10/20/2021 Colonoscopy:  07/12/2012, follow up 10 years BMD:   10/17/2020 Osteopenia Screening Labs: on Friday with PCP   reports that she quit smoking about 10 years ago. Her smoking use included cigarettes. She has never used smokeless tobacco. She reports current alcohol use of about 5.0 standard drinks of alcohol per week. She reports that she does not use drugs.  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   History of kidney stones    HSV-2 (herpes simplex virus 2) infection    Hyperlipidemia    IBS (irritable bowel syndrome)    Interstitial cystitis    Nevus    vulva nevus   Prediabetes     Past Surgical History:  Procedure Laterality Date   BLADDER REPAIR     BUNIONECTOMY     CHOLECYSTECTOMY     COLONOSCOPY     LAPAROSCOPY  1985/ 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 LIMIT TO 5 DAYS A WEEK TO AVOID ADDICTION AND DEMENTIA 25 tablet 0   ascorbic acid (VITAMIN C) 500 MG tablet Take 250 mg by mouth every other day.     CALCIUM CITRATE PO Take 1 tablet by mouth daily.     Cholecalciferol (VITAMIN D) 50 MCG (2000 UT) tablet Take 2,000 Units by mouth daily.     Cyanocobalamin (B-12 PO) Take by mouth.     hyoscyamine (LEVSIN) 0.125 MG tablet Take 1 tablet (0.125 mg total) by mouth every 4 (four) hours as needed for cramping (diarrhea, nausea). 50 tablet 2   ibuprofen (ADVIL) 800 MG tablet Take 1 tablet (800 mg total) by mouth every 8 (eight) hours as needed. 30 tablet 0   omeprazole (PRILOSEC) 40  MG capsule TAKE 1 CAPSULE DAILY TO PREVENT HEARTBURN & INDIGESTION 90 capsule 3   oxybutynin (DITROPAN-XL) 10 MG 24 hr tablet Take 10 mg by mouth at bedtime.     rosuvastatin (CRESTOR) 20 MG tablet Take  1 tablet  Daily  for Cholesterol (Patient taking differently: Take 10 mg by mouth daily.) 90 tablet 3   estradiol (ESTRACE) 0.5 MG tablet Take 0.5 tablets (0.25 mg total) by mouth every other day. 45 tablet 3   medroxyPROGESTERone (PROVERA) 2.5 MG tablet Take 0.5 tablets (1.25 mg total) by mouth every other day. 45 tablet 3   valACYclovir (VALTREX) 500 MG tablet Take 1 tablet (500 mg total) by mouth daily. 90 tablet 3   No current facility-administered medications for this visit.    Family History  Problem Relation Age of Onset   Celiac disease Mother    Heart disease Father    Irritable bowel syndrome Sister    Thyroid disease Sister    Pancreatic cancer Maternal Grandmother    Lung cancer Maternal Grandmother     Diabetes Paternal Grandmother    Breast cancer Paternal Grandmother    Kidney disease Other        tumor   Cancer Sister 36       Melanoma   Heart disease Sister    Colon cancer Neg Hx    Esophageal cancer Neg Hx    Rectal cancer Neg Hx    Stomach cancer Neg Hx     ROS: Constitutional: negative Genitourinary:negative  Exam:   BP 131/65 (BP Location: Right Arm, Patient Position: Sitting, Cuff Size: Normal)   Pulse 69   Ht '5\' 3"'$  (1.6 m) Comment: Reported  Wt 109 lb 9.6 oz (49.7 kg)   LMP 01/20/2011   BMI 19.41 kg/m   Height: '5\' 3"'$  (160 cm) (Reported)  General appearance: alert, cooperative and appears stated age Head: Normocephalic, without obvious abnormality, atraumatic Neck: no adenopathy, supple, symmetrical, trachea midline and thyroid normal to inspection and palpation Lungs: clear to auscultation bilaterally Breasts: normal appearance, no masses or tenderness Heart: regular rate and rhythm Abdomen: soft, non-tender; bowel sounds normal; no masses,  no organomegaly Extremities: extremities normal, atraumatic, no cyanosis or edema Skin: Skin color, texture, turgor normal. No rashes or lesions Lymph nodes: Cervical, supraclavicular, and axillary nodes normal. No abnormal inguinal nodes palpated Neurologic: Grossly normal   Pelvic: External genitalia:  no lesions              Urethra:  normal appearing urethra with no masses, tenderness or lesions              Bartholins and Skenes: normal                 Vagina: normal appearing vagina with normal color and no discharge, no lesions              Cervix: no lesions              Pap taken: Yes.   Bimanual Exam:  Uterus:  normal size, contour, position, consistency, mobility, non-tender              Adnexa: normal adnexa and no mass, fullness, tenderness               Rectovaginal: Confirms               Anus:  normal sphincter tone, no lesions  Chaperone, Octaviano Batty, CMA, was present for  exam.  Assessment/Plan: 1. Well  woman exam with routine gynecological exam - Pap smear obtained today - Mammogram 09/2021 - Colonoscopy 2014.  Due this year.  Discussed with pt. - Bone mineral density 2022 with osteopenia - lab work will be done on Friday - vaccines reviewed/updated  2. Hormone replacement therapy (HRT) - estradiol (ESTRACE) 0.5 MG tablet; Take 0.5 tablets (0.25 mg total) by mouth every other day.  Dispense: 45 tablet; Refill: 3 - medroxyPROGESTERone (PROVERA) 2.5 MG tablet; Take 0.5 tablets (1.25 mg total) by mouth every other day.  Dispense: 45 tablet; Refill: 3  3. HSV-2 infection - valACYclovir (VALTREX) 500 MG tablet; Take 1 tablet (500 mg total) by mouth daily.  Dispense: 90 tablet; Refill: 3  4. High risk HPV infection - Cytology - PAP( [facility])  5. Recurrent UTI - on estrogen cream and this has really helped.

## 2022-02-11 LAB — CYTOLOGY - PAP
Comment: NEGATIVE
Comment: NEGATIVE
Comment: NEGATIVE
Diagnosis: UNDETERMINED — AB
HPV 16: NEGATIVE
HPV 18 / 45: NEGATIVE
High risk HPV: POSITIVE — AB

## 2022-02-11 NOTE — Progress Notes (Signed)
 COMPLETE PHYSICAL  Assessment and Plan:  Encounter for general adult medical examination with abnormal findings Due Yearly  Epigastric pain  Normal endoscopy 09/14/19- continue to follow with Dr. Rosana Hoes Continue dietary modifications, medication and continue to monitor   Mixed hyperlipidemia Continue medications:rosuvastatin '10mg'$  Discussed dietary and exercise modifications Low fat diet       -     Lipid panel -     EKG 12-Lead   Irritable bowel syndrome with constipation Increase fiber, diet discussed Discussed metamucil Uses miralax prn  Anxiety state -continue medications: Alprazolam 0.'25mg'$  half to one prn. , stress management techniques discussed, increase water, good sleep hygiene discussed, increase exercise, and increase veggies.   Chronic interstitial cystitis Follows with Urology Continue to monitor  Abnormal glucose Discussed disease progression and risks Discussed diet/exercise, weight management and risk modification -     Defer A1C today, weight stable, normal A1C last visit  HSV-2 infection Continue medications  Allergic state, sequela Continue OTC meds  Vitamin D deficiency .Continue supplementation to maintain goal of 70-100 Taking Vitamin D 2,000 IU daily Recent increasel -     VITAMIN D 25 Hydroxy (Vit-D Deficiency, Fractures)  B12 def  Taking 1,'000mg'$  every other day supplementation -B12  Vasmotor symptom  Estradiol 0.'5mg'$  half tablet, progesterone 2.'5mg'$  half tablet - increase to daily  Medication management -     CBC with Differential/Platelet -    CMP -     Magnesium  Abnormal TSH -     TSH  Hx of Hepatitis A - Monitor liver functions  Screening ischemic heart disease -EKG  Screening blood protein in Urine -UA routine with reflex microscopic - Microalbumin/creatinine urine ratio  Screening for AAA - U/S ABD Retroperitoneal LTD  BMI 19.52 Add on protein powder, start to work out again.   Vaginal atrophy Encouraged to take  estrace and Provera daily to determine if this helps with recurrent UTI symptoms.   Begin Fem Dophilus probiotic that was recommended by GYN    Discussed med's effects and SE's. Screening labs and tests as requested with regular follow-up as recommended. Future Appointments  Date Time Provider [place]  02/10/2023  2:15 PM Megan Salon, MD [facility]  02/14/2023  9:00 AM Alycia Rossetti, NP [facility] None    HPI  67 y.o. female  presents for a complete physical.  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   She sees Dr. Zachery Dakins next week for follow up.  Good ROM and very little pain.  Frequent UTI's, she has interstitial cystitis.  Frequency remains present over urgency.  She is taking oxybutinin which is helping with frequency and spasms.  She follows with Dr Evens Q79month  She also had kidney stones. Last urology appt was 11/27/21 and was advised to avoid cranberry and Vit C as increase acidity and can worsen IC, continue vaginal premarin. Last GYN appt 02/08/22 with Dr. MSabra Heck Pap was done at that visit- revealed ASCUS- continues to follow with GYN  She has had severe epigastric pressure that goes over her RUQ, will happen mainly at night, non exertional, every couple of months. Has had her gallbladder removed in the past, states it feels like that. She is on omeprazole every day. EGD 2014.  She does wine/mixed drink 3 days a week. She is following with Dr. DLoletha Carrow       Her blood pressure has been controlled at home, today their BP is BP: 116/62.  BP Readings from Last 3 Encounters:  02/12/22 116/62  02/08/22 131/65  01/04/22 128/68   She does workout. She denies chest pain, shortness of breath, dizziness.   BMI is Body mass index is 19.08 kg/m., she is working on diet and exercise. Wt Readings from Last 3 Encounters:  02/12/22 109 lb 6.4 oz (49.6 kg)  02/08/22 109 lb 9.6 oz (49.7 kg)  01/04/22 105 lb (47.6 kg)     She is on cholesterol medication. She is doing 20 mg 1/2 every day. Her cholesterol is at goal. The cholesterol last visit was:  Lab Results  Component Value Date   CHOL 189 02/12/2021   HDL 75 02/12/2021   LDLCALC 98 02/12/2021   TRIG 72 02/12/2021   CHOLHDL 2.5 02/12/2021   She has been working on diet and exercise for prediabetes, she is on bASA, she is not on ACE/ARB and denies foot ulcerations, hyperglycemia, hypoglycemia , increased appetite, nausea, paresthesia of the feet, polydipsia, polyuria, visual disturbances, vomiting and weight loss. Last A1C in the office was:  Lab Results  Component Value Date   HGBA1C 5.8 (H) 02/12/2021   Lab Results  Component Value Date   EGFR 101 06/30/2021    Patient is on Vitamin D supplement.   Lab Results  Component Value Date   VD25OH 31 02/12/2021       She is very stressed, keeps her grand babies and another baby 3 days a week, 1 under 2 one is 2 and one is 65 years old. She mainly takes xanax 0.25 1/2-1 tab as needed at night, none during the day.   Current Medications:   Current Outpatient Medications (Endocrine & Metabolic):    estradiol (ESTRACE) 0.5 MG tablet, Take 0.5 tablets (0.25 mg total) by mouth every other day.   medroxyPROGESTERone (PROVERA) 2.5 MG tablet, Take 0.5 tablets (1.25 mg total) by mouth every other day.  Current Outpatient Medications (Cardiovascular):    rosuvastatin (CRESTOR) 20 MG tablet, Take  1 tablet  Daily  for Cholesterol (Patient taking differently: Take 10 mg by mouth daily.)   Current Outpatient Medications (Analgesics):    ibuprofen (ADVIL) 800 MG tablet, Take 1 tablet (800 mg total) by mouth every 8 (eight) hours as needed. (Patient not taking: Reported on 02/12/2022)  Current Outpatient Medications (Hematological):    Cyanocobalamin (B-12 PO), Take by mouth.  Current Outpatient Medications (Other):    ALPRAZolam (XANAX) 0.25 MG tablet, TAKE 1/2 TO 1 TABLET BY MOUTH AT BEDTIME ONLY AS  NEEDED FOR SLEEP. LIMIT TO 5 DAYS A WEEK TO AVOID ADDICTION AND DEMENTIA   ascorbic acid (VITAMIN C) 500 MG tablet, Take 250 mg by mouth every other day.   CALCIUM CITRATE PO, Take 1 tablet by mouth daily.   Cholecalciferol (VITAMIN D) 50 MCG (2000 UT) tablet, Take 2,000 Units by mouth daily.   estradiol (ESTRACE) 0.1 MG/GM vaginal cream, Place 1 Applicatorful vaginally at bedtime.   hyoscyamine (LEVSIN) 0.125 MG tablet, Take 1 tablet (0.125 mg total) by mouth every 4 (four) hours as needed for cramping (diarrhea, nausea).   omeprazole (PRILOSEC) 40 MG capsule, TAKE 1 CAPSULE DAILY TO PREVENT HEARTBURN & INDIGESTION   oxybutynin (DITROPAN-XL) 10 MG 24 hr tablet, Take 10 mg by mouth at bedtime.   valACYclovir (VALTREX) 500 MG tablet, Take 1 tablet (500 mg total) by mouth daily.  Health Maintenance:   Immunization History  Administered Date(s) Administered   DTaP 01/26/2004   Influenza Inj Mdck Quad Pf 10/29/2016, 10/17/2017, 10/20/2018  Influenza,inj,Quad PF,6+ Mos 10/20/2018, 10/22/2019   Influenza-Unspecified 10/22/2015, 10/29/2016, 10/17/2017, 10/20/2018, 10/23/2020, 11/05/2020, 10/29/2021   PFIZER Comirnaty(Gray Top)Covid-19 Tri-Sucrose Vaccine 04/17/2019, 05/08/2019   Pfizer Covid-19 Vaccine Bivalent Booster 62yr & up 01/01/2020   Tdap 01/28/2014   Zoster, Live 01/26/2007   Tetanus: 2016 Flu vaccine: 11/05/20 Zostavax: 2009 Shingrix: Discussed with patient, at age 67  Pap: 01/2022 Dr. MSabra Heck ASCUS high risk HPV MGM: 10/20/21 DEXA 10/17/20, osteoporosis Colonoscopy: 2014, due 2024 Dr. DRosana HoesLast Dental Exam:  Dr. HEnnis FortsLast Eye Exam: Dr. YEarl GalaHep C: Negative  Patient Care Team: WAlycia Rossetti NP as PCP - General (Nurse Practitioner) BLafayette Dragon MD (Inactive) as Consulting Physician (Gastroenterology)    Medical History:  Past Medical History:  Diagnosis Date   Allergy    ANAL FISSURE, HX OF 09/24/2008   Qualifier: Diagnosis of  By: STamala JulianCMA, Kelly      Anxiety    Arthritis    Cancer ([facility]    skin cancer   Fibroids    Uterine   GERD (gastroesophageal reflux disease)    Hepatitis    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   History of kidney stones    HSV-2 (herpes simplex virus 2) infection    Hyperlipidemia    IBS (irritable bowel syndrome)    Interstitial cystitis    Nevus    vulva nevus   Prediabetes    Allergies Allergies  Allergen Reactions   Sulfa Antibiotics Hives, Shortness Of Breath and Rash   Codeine Nausea And Vomiting   Macrobid [Nitrofurantoin Macrocrystal] Nausea Only    SURGICAL HISTORY She  has a past surgical history that includes Cholecystectomy; tubal reconstruction; Bladder repair; Tonsillectomy; Vaginal delivery; laparoscopy (1985/ 1990); tubalplasty (1985/ 1990 ); Bunionectomy; Lithotripsy; Colonoscopy; Upper gastrointestinal endoscopy; and Total hip arthroplasty (Right, 01/04/2022). FAMILY HISTORY Her family history includes Breast cancer in her paternal grandmother; Cancer (age of onset: 533 in her sister; Celiac disease in her mother; Diabetes in her paternal grandmother; Heart disease in her father and sister; Irritable bowel syndrome in her sister; Kidney disease in an other family member; Lung cancer in her maternal grandmother; Pancreatic cancer in her maternal grandmother; Thyroid disease in her sister. SOCIAL HISTORY She  reports that she quit smoking about 10 years ago. Her smoking use included cigarettes. She has never used smokeless tobacco. She reports current alcohol use of about 5.0 standard drinks of alcohol per week. She reports that she does not use drugs.  Review of Systems: Review of Systems  Constitutional:  Negative for chills and fever.  HENT:  Negative for congestion, hearing loss, sinus pain, sore throat and tinnitus.   Eyes:  Negative for blurred vision and double vision.  Respiratory:  Negative for cough, hemoptysis, sputum production, shortness of breath and wheezing.    Cardiovascular:  Negative for chest pain, palpitations and leg swelling.  Gastrointestinal:  Positive for heartburn. Negative for abdominal pain, constipation, diarrhea, nausea and vomiting.  Genitourinary:  Negative for dysuria and urgency.  Musculoskeletal:  Positive for joint pain (right hip). Negative for back pain, falls, myalgias and neck pain.  Skin:  Negative for rash.  Neurological:  Negative for dizziness, tingling, tremors, weakness and headaches.  Endo/Heme/Allergies:  Does not bruise/bleed easily.  Psychiatric/Behavioral:  Negative for depression and suicidal ideas. The patient is nervous/anxious and has insomnia.     Physical Exam: Estimated body mass index is 19.08 kg/m as calculated from the following:   Height as of this encounter: 5'  3.5" (1.613 m).   Weight as of this encounter: 109 lb 6.4 oz (49.6 kg). BP 116/62   Pulse 73   Temp (!) 97.3 F (36.3 C)   Ht 5' 3.5" (1.613 m)   Wt 109 lb 6.4 oz (49.6 kg)   LMP 01/20/2011   SpO2 99%   BMI 19.08 kg/m   General Appearance: Very pleasant thin female in no apparent distress.  Eyes: PERRLA, EOMs, conjunctiva no swelling or erythema ENT/Mouth: Ear canals normal without obstruction, swelling, erythema, or discharge.  TMs normal bilaterally with no erythema, bulging, retraction, or loss of landmark.  Oropharynx moist and clear with no exudate, erythema, or swelling.   Neck: Supple, thyroid normal. No bruits.  No cervical adenopathy Respiratory: Respiratory effort normal, Breath sounds clear A&P without wheeze, rhonchi, rales.   Cardio: RRR without murmurs, rubs or gallops. Brisk peripheral pulses without edema.  Chest: symmetric, with normal excursions Breasts: Deferred to GYN Abdomen: Soft, nontender, no guarding, rebound, hernias, masses, or organomegaly.  Lymphatics: Non tender without lymphadenopathy.  Musculoskeletal: Full ROM all peripheral extremities,5/5 strength, and normal gait. Right hip mild tenderness Skin:  Warm, dry without rashes, lesions, ecchymosis. Neuro: Awake and oriented X 3, Cranial nerves intact, reflexes equal bilaterally. Normal muscle tone, no cerebellar symptoms. Sensation intact.  Psych:  normal affect, Insight and Judgment appropriate.   EKG: NSR, no ST changes AAA: < 3 cm  Over 40 minutes of exam, counseling, chart review and critical decision making was performed  Kemaya Dorner E  3:54 PM [facility]

## 2022-02-12 ENCOUNTER — Encounter: Payer: Self-pay | Admitting: Nurse Practitioner

## 2022-02-12 ENCOUNTER — Ambulatory Visit (INDEPENDENT_AMBULATORY_CARE_PROVIDER_SITE_OTHER): Payer: 59 | Admitting: Nurse Practitioner

## 2022-02-12 DIAGNOSIS — E782 Mixed hyperlipidemia: Secondary | ICD-10-CM

## 2022-02-12 DIAGNOSIS — Z1389 Encounter for screening for other disorder: Secondary | ICD-10-CM

## 2022-02-12 DIAGNOSIS — B009 Herpesviral infection, unspecified: Secondary | ICD-10-CM

## 2022-02-12 DIAGNOSIS — N952 Postmenopausal atrophic vaginitis: Secondary | ICD-10-CM

## 2022-02-12 DIAGNOSIS — I10 Essential (primary) hypertension: Secondary | ICD-10-CM | POA: Diagnosis not present

## 2022-02-12 DIAGNOSIS — Z0000 Encounter for general adult medical examination without abnormal findings: Secondary | ICD-10-CM | POA: Diagnosis not present

## 2022-02-12 DIAGNOSIS — Z0001 Encounter for general adult medical examination with abnormal findings: Secondary | ICD-10-CM

## 2022-02-12 DIAGNOSIS — Z136 Encounter for screening for cardiovascular disorders: Secondary | ICD-10-CM

## 2022-02-12 DIAGNOSIS — R7989 Other specified abnormal findings of blood chemistry: Secondary | ICD-10-CM

## 2022-02-12 DIAGNOSIS — Z8619 Personal history of other infectious and parasitic diseases: Secondary | ICD-10-CM

## 2022-02-12 DIAGNOSIS — K581 Irritable bowel syndrome with constipation: Secondary | ICD-10-CM

## 2022-02-12 DIAGNOSIS — I700 Atherosclerosis of aorta: Secondary | ICD-10-CM | POA: Diagnosis not present

## 2022-02-12 DIAGNOSIS — G4700 Insomnia, unspecified: Secondary | ICD-10-CM

## 2022-02-12 DIAGNOSIS — F411 Generalized anxiety disorder: Secondary | ICD-10-CM

## 2022-02-12 DIAGNOSIS — Z681 Body mass index (BMI) 19 or less, adult: Secondary | ICD-10-CM

## 2022-02-12 DIAGNOSIS — N3010 Interstitial cystitis (chronic) without hematuria: Secondary | ICD-10-CM

## 2022-02-12 DIAGNOSIS — K219 Gastro-esophageal reflux disease without esophagitis: Secondary | ICD-10-CM

## 2022-02-12 DIAGNOSIS — Z79899 Other long term (current) drug therapy: Secondary | ICD-10-CM

## 2022-02-12 DIAGNOSIS — R7309 Other abnormal glucose: Secondary | ICD-10-CM

## 2022-02-12 DIAGNOSIS — E559 Vitamin D deficiency, unspecified: Secondary | ICD-10-CM

## 2022-02-12 MED ORDER — ALPRAZOLAM 0.25 MG PO TABS: ORAL_TABLET | ORAL | 0 refills | Status: DC

## 2022-02-12 NOTE — Patient Instructions (Signed)

## 2022-02-13 LAB — CBC WITH DIFFERENTIAL/PLATELET
Absolute Monocytes: 419 cells/uL (ref 200–950)
Basophils Absolute: 28 cells/uL (ref 0–200)
Basophils Relative: 0.6 %
Eosinophils Absolute: 120 cells/uL (ref 15–500)
Eosinophils Relative: 2.6 %
HCT: 40 % (ref 35.0–45.0)
Hemoglobin: 13.5 g/dL (ref 11.7–15.5)
Lymphs Abs: 1247 cells/uL (ref 850–3900)
MCH: 30.8 pg (ref 27.0–33.0)
MCHC: 33.8 g/dL (ref 32.0–36.0)
MCV: 91.3 fL (ref 80.0–100.0)
MPV: 10.2 fL (ref 7.5–12.5)
Monocytes Relative: 9.1 %
Neutro Abs: 2788 cells/uL (ref 1500–7800)
Neutrophils Relative %: 60.6 %
Platelets: 253 10*3/uL (ref 140–400)
RBC: 4.38 10*6/uL (ref 3.80–5.10)
RDW: 11.4 % (ref 11.0–15.0)
Total Lymphocyte: 27.1 %
WBC: 4.6 10*3/uL (ref 3.8–10.8)

## 2022-02-13 LAB — COMPLETE METABOLIC PANEL WITH GFR
AG Ratio: 2.4 (calc) (ref 1.0–2.5)
ALT: 14 U/L (ref 6–29)
AST: 16 U/L (ref 10–35)
Albumin: 4.8 g/dL (ref 3.6–5.1)
Alkaline phosphatase (APISO): 78 U/L (ref 37–153)
BUN: 10 mg/dL (ref 7–25)
CO2: 27 mmol/L (ref 20–32)
Calcium: 10 mg/dL (ref 8.6–10.4)
Chloride: 104 mmol/L (ref 98–110)
Creat: 0.53 mg/dL (ref 0.50–1.05)
Globulin: 2 g/dL (calc) (ref 1.9–3.7)
Glucose, Bld: 92 mg/dL (ref 65–99)
Potassium: 3.9 mmol/L (ref 3.5–5.3)
Sodium: 142 mmol/L (ref 135–146)
Total Bilirubin: 0.4 mg/dL (ref 0.2–1.2)
Total Protein: 6.8 g/dL (ref 6.1–8.1)
eGFR: 103 mL/min/{1.73_m2} (ref >=60)

## 2022-02-13 LAB — LIPID PANEL
Cholesterol: 176 mg/dL (ref <200)
HDL: 75 mg/dL (ref >=50)
LDL Cholesterol (Calc): 84 mg/dL (calc)
Non-HDL Cholesterol (Calc): 101 mg/dL (calc) (ref <130)
Total CHOL/HDL Ratio: 2.3 (calc) (ref <5.0)
Triglycerides: 77 mg/dL (ref <150)

## 2022-02-13 LAB — MICROALBUMIN / CREATININE URINE RATIO
Creatinine, Urine: 11 mg/dL — ABNORMAL LOW (ref 20–275)
Microalb, Ur: <0.2 mg/dL

## 2022-02-13 LAB — URINALYSIS W MICROSCOPIC + REFLEX CULTURE
Bacteria, UA: NONE SEEN /HPF
Bilirubin Urine: NEGATIVE
Glucose, UA: NEGATIVE
Hgb urine dipstick: NEGATIVE
Hyaline Cast: NONE SEEN /LPF
Ketones, ur: NEGATIVE
Leukocyte Esterase: NEGATIVE
Nitrites, Initial: NEGATIVE
Protein, ur: NEGATIVE
RBC / HPF: NONE SEEN /HPF (ref 0–2)
Specific Gravity, Urine: 1.004 (ref 1.001–1.035)
Squamous Epithelial / HPF: NONE SEEN /HPF (ref <=5)
WBC, UA: NONE SEEN /HPF (ref 0–5)
pH: 7 (ref 5.0–8.0)

## 2022-02-13 LAB — NO CULTURE INDICATED

## 2022-02-13 LAB — HEMOGLOBIN A1C
Hgb A1c MFr Bld: 5.6 % of total Hgb (ref <5.7)
Mean Plasma Glucose: 114 mg/dL
eAG (mmol/L): 6.3 mmol/L

## 2022-02-13 LAB — MAGNESIUM: Magnesium: 2 mg/dL (ref 1.5–2.5)

## 2022-02-13 LAB — TSH: TSH: 0.56 mIU/L (ref 0.40–4.50)

## 2022-02-13 LAB — VITAMIN D 25 HYDROXY (VIT D DEFICIENCY, FRACTURES): Vit D, 25-Hydroxy: 61 ng/mL (ref 30–100)

## 2022-02-15 ENCOUNTER — Encounter (HOSPITAL_BASED_OUTPATIENT_CLINIC_OR_DEPARTMENT_OTHER): Payer: Self-pay | Admitting: Obstetrics & Gynecology

## 2022-02-20 ENCOUNTER — Encounter: Payer: Self-pay | Admitting: Nurse Practitioner

## 2022-02-23 ENCOUNTER — Ambulatory Visit (HOSPITAL_BASED_OUTPATIENT_CLINIC_OR_DEPARTMENT_OTHER): Payer: 59 | Admitting: Obstetrics & Gynecology

## 2022-02-23 ENCOUNTER — Other Ambulatory Visit (HOSPITAL_COMMUNITY)
Admission: RE | Admit: 2022-02-23 | Discharge: 2022-02-23 | Disposition: A | Discharge status: Home / Self Care | Payer: 59 | Source: Ambulatory Visit | Attending: Obstetrics & Gynecology | Admitting: Obstetrics & Gynecology

## 2022-02-23 ENCOUNTER — Encounter (HOSPITAL_BASED_OUTPATIENT_CLINIC_OR_DEPARTMENT_OTHER): Payer: Self-pay | Admitting: Obstetrics & Gynecology

## 2022-02-23 VITALS — BP 136/71 | HR 78 | Ht 63.0 in | Wt 109.4 lb

## 2022-02-23 DIAGNOSIS — N871 Moderate cervical dysplasia: Secondary | ICD-10-CM | POA: Diagnosis not present

## 2022-02-23 DIAGNOSIS — R87810 Cervical high risk human papillomavirus (HPV) DNA test positive: Secondary | ICD-10-CM

## 2022-02-23 DIAGNOSIS — R87610 Atypical squamous cells of undetermined significance on cytologic smear of cervix (ASC-US): Secondary | ICD-10-CM

## 2022-02-23 NOTE — Progress Notes (Signed)
67 y.o. G2P2 Married Not Hispanic or Latino female here for colposcopy with possible biopsies and/or ECC due to ASCUS Pap with HR HPV obtained 02/08/2021.     Patient's last menstrual period was 01/20/2011.          Sexually active: Yes.    The current method of family planning is post menopausal status.     Patient has been counseled about results and procedure.  Risks and benefits have bene reviewed including immediate and/or delayed bleeding, infection, cervical scaring from procedure, possibility of needing additional follow up as well as treatment.  Rare risks of missing a lesion discussed as well.  All questions answered.  Pt ready to proceed.  Consent obtained.  BP (!) 141/62 (BP Location: Right Arm, Patient Position: Sitting, Cuff Size: Normal)   Pulse 73   Ht '5\' 3"'$  (1.6 m) Comment: Reported  Wt 109 lb 6.4 oz (49.6 kg)   LMP 01/20/2011   BMI 19.38 kg/m   General appearance: alert, cooperative and appears stated age Lymph nodes: No abnormal inguinal nodes palpated Neurologic: Grossly normal  Pelvic: External genitalia:  no lesions              Urethra:  normal appearing urethra with no masses, tenderness or lesions              Bartholins and Skenes: normal                 Vagina: normal appearing vagina with normal color and no discharge, no lesions               Physical Exam Constitutional:      Appearance: Normal appearance.  Genitourinary:    General: Normal vulva.     Vagina: Normal.     Cervix: Normal.  Neurological:     General: No focal deficit present.     Mental Status: She is alert.  Psychiatric:        Mood and Affect: Mood normal.    Speculum placed.  3% acetic acid applied to cervix for >45 seconds.  Cervix visualized with both 7.5X and 15X magnification.  Green filter also used.  Lugols solution was used.  Findings:  no abnormal staining.  Biopsy:  not obtained.  ECC:  was performed.  Monsel's was not needed.  Excellent hemostasis was present.  Pt  tolerated procedure well and all instruments were removed.   Chaperone, Octaviano Batty, CMA, was present during procedure.  Assessment/Plan: 1. ASCUS with positive high risk HPV cervical - Surgical pathology( [place]/ [facility]) - Pathology results will be called to patient and follow-up planned pending results.

## 2022-02-25 LAB — SURGICAL PATHOLOGY

## 2022-03-12 ENCOUNTER — Encounter (HOSPITAL_BASED_OUTPATIENT_CLINIC_OR_DEPARTMENT_OTHER): Payer: Self-pay | Admitting: Obstetrics & Gynecology

## 2022-03-12 ENCOUNTER — Ambulatory Visit (HOSPITAL_BASED_OUTPATIENT_CLINIC_OR_DEPARTMENT_OTHER): Payer: Self-pay | Admitting: Obstetrics & Gynecology

## 2022-03-12 VITALS — BP 120/70 | HR 88 | Ht 63.0 in | Wt 108.6 lb

## 2022-03-12 DIAGNOSIS — N871 Moderate cervical dysplasia: Secondary | ICD-10-CM

## 2022-03-12 DIAGNOSIS — R300 Dysuria: Secondary | ICD-10-CM

## 2022-03-15 ENCOUNTER — Encounter: Payer: Self-pay | Admitting: *Deleted

## 2022-03-15 ENCOUNTER — Encounter (HOSPITAL_BASED_OUTPATIENT_CLINIC_OR_DEPARTMENT_OTHER): Payer: Self-pay | Admitting: Obstetrics & Gynecology

## 2022-03-15 LAB — URINE CULTURE: Organism ID, Bacteria: NO GROWTH

## 2022-03-15 NOTE — Progress Notes (Signed)
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  Grounding pad cannot be safely applied to skin/connected with equipement and therefore procedure cannot be done.  Discussed this with pt and her husband.  Questions answered.  Will be rescheduled as soon as we know we have ordered grounding pad.    Pt is having some dysuria and would like to have this tested today.  Urine sample provided and culture ordered.

## 2022-03-16 ENCOUNTER — Other Ambulatory Visit: Payer: Self-pay | Admitting: Nurse Practitioner

## 2022-03-16 DIAGNOSIS — G4700 Insomnia, unspecified: Secondary | ICD-10-CM

## 2022-03-25 ENCOUNTER — Other Ambulatory Visit (HOSPITAL_COMMUNITY)
Admission: RE | Admit: 2022-03-25 | Discharge: 2022-03-25 | Disposition: A | Discharge status: Home / Self Care | Payer: 59 | Source: Ambulatory Visit | Attending: Obstetrics & Gynecology | Admitting: Obstetrics & Gynecology

## 2022-03-25 ENCOUNTER — Ambulatory Visit (INDEPENDENT_AMBULATORY_CARE_PROVIDER_SITE_OTHER): Payer: 59 | Admitting: Obstetrics & Gynecology

## 2022-03-25 ENCOUNTER — Encounter (HOSPITAL_BASED_OUTPATIENT_CLINIC_OR_DEPARTMENT_OTHER): Payer: Self-pay | Admitting: Obstetrics & Gynecology

## 2022-03-25 VITALS — BP 130/62 | HR 75 | Ht 63.0 in | Wt 108.2 lb

## 2022-03-25 DIAGNOSIS — N871 Moderate cervical dysplasia: Secondary | ICD-10-CM

## 2022-03-28 NOTE — Progress Notes (Signed)
67 y.o. 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  Pap obtained before colposcopy showed ASCUS with +HR HPV.        Patient's last menstrual period was 01/20/2011.          Sexually active: Yes.    The current method of family planning is post menopausal status.     Pre-procedure vitals: Blood pressure 130/62, pulse 75, height '5\' 3"'$  (1.6 m), weight 108 lb 3.2 oz (49.1 kg), last menstrual period 01/20/2011.   Procedure explained and patient's questions were invited and answered.   Consent form signed.   Procedure Set-up: Grounding pad located left thigh (due to prior right hip surgery).  Cautery settings: blend 45 cut/45 coagulation.  Suction applied to coated speculum.  Procedure:  Speculum placed with good visualization of the cervix.  Colposcopy performed showing:  no visible lesions.  Cervix anesthetized using 2% Xylocaine with 1:100,000units Epinephrine.  8 cc's used.  Entire transition zone excised with 12 x 15 loop in 1 pass.  Then second top hat specimen obtained with 10 x 10 loop in one pass.  ECC obtained above LEEP specimen.  Specimen(s) placed on cork and labeled for pathology.  Larger first LEEP specimen labeled with pin at 12 o'clock position.  Hemostasis obtained with ball cautery and Monsel's solution.  EBL:  Minimal  Complications:  none.  Patient tolerated procedure well and left the office in satisfactory condition.  Assessment/Plan: 1. Dysplasia of cervix, high grade CIN 2 - Surgical pathology( [place]/ [facility]) - Follow up 1 month for recheck - Pathology will be called to pt and follow up Pap smear planned at that time. - Post procedure instructions reviewed including pelvic rest until follow up

## 2022-03-29 LAB — SURGICAL PATHOLOGY

## 2022-03-31 ENCOUNTER — Encounter (HOSPITAL_BASED_OUTPATIENT_CLINIC_OR_DEPARTMENT_OTHER): Payer: Self-pay | Admitting: Obstetrics & Gynecology

## 2022-04-06 ENCOUNTER — Encounter (HOSPITAL_BASED_OUTPATIENT_CLINIC_OR_DEPARTMENT_OTHER): Payer: Self-pay | Admitting: Obstetrics & Gynecology

## 2022-04-06 ENCOUNTER — Telehealth (HOSPITAL_BASED_OUTPATIENT_CLINIC_OR_DEPARTMENT_OTHER): Payer: Self-pay | Admitting: *Deleted

## 2022-04-06 ENCOUNTER — Other Ambulatory Visit (HOSPITAL_BASED_OUTPATIENT_CLINIC_OR_DEPARTMENT_OTHER): Payer: Self-pay | Admitting: Obstetrics & Gynecology

## 2022-04-06 MED ORDER — METRONIDAZOLE 500 MG PO TABS: 500.0000 mg | ORAL_TABLET | Freq: Two times a day (BID) | ORAL | 0 refills | Status: DC

## 2022-04-06 NOTE — Telephone Encounter (Signed)
Pt called with complaints of passing some red tissue on and off the last few days after having a LEEP procedure done on 03/25/22.  She reports some cramping and a slight odor. She states that Dr. Sabra Heck told her to call if there was an odor. Advised that she should come in for evaluation Pt can not come today or tomorrow. Pt scheduled for appt on Thursday. Advised that provider will send rx to pharmacy. Pt verbalized understanding.

## 2022-04-08 ENCOUNTER — Encounter (HOSPITAL_BASED_OUTPATIENT_CLINIC_OR_DEPARTMENT_OTHER): Payer: Self-pay | Admitting: Obstetrics & Gynecology

## 2022-04-08 ENCOUNTER — Ambulatory Visit (INDEPENDENT_AMBULATORY_CARE_PROVIDER_SITE_OTHER): Payer: 59 | Admitting: Obstetrics & Gynecology

## 2022-04-08 VITALS — BP 129/77 | HR 75 | Ht 60.0 in | Wt 109.0 lb

## 2022-04-08 DIAGNOSIS — N898 Other specified noninflammatory disorders of vagina: Secondary | ICD-10-CM

## 2022-04-08 DIAGNOSIS — Z9889 Other specified postprocedural states: Secondary | ICD-10-CM

## 2022-04-11 NOTE — Progress Notes (Signed)
GYNECOLOGY  VISIT  CC:   discharge, spotting after discharge  HPI: 67 y.o. G2P2 Married White or Caucasian female here for complaint of some discharge, spotting and odor after having LEEP procedure.  Bleeding is not heavy and has been primarily dark in color.  She called earlier this week and was prescribed flagyl 500mg  bid and has started this.  She was offered earlier appointment this week but could not come.  Denies fever.  Pathology reviewed.  Does have follow up pap scheduled for 6 months.     Past Medical History:  Diagnosis Date   Allergy    ANAL FISSURE, HX OF 09/24/2008   Qualifier: Diagnosis of  By: Tamala Julian CMA, Claiborne Billings     Anxiety    Arthritis    Cancer [facility])    skin cancer   Fibroids    Uterine   GERD (gastroesophageal reflux disease)    Hepatitis    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   History of kidney stones    HSV-2 (herpes simplex virus 2) infection    Hyperlipidemia    IBS (irritable bowel syndrome)    Interstitial cystitis    Nevus    vulva nevus   Prediabetes     MEDS:   Current Outpatient Medications on File Prior to Visit  Medication Sig Dispense Refill   ALPRAZolam (XANAX) 0.25 MG tablet TAKE 1/2 TO 1 TABLET BY MOUTH AT BEDTIME ONLY AS NEEDED FOR SLEEP. LIMIT TO 5 DAYS PER WEEK TO AVOID ADDICTION AND DEMENTIA. 25 tablet 0   ascorbic acid (VITAMIN C) 500 MG tablet Take 250 mg by mouth every other day.     CALCIUM CITRATE PO Take 1 tablet by mouth daily.     Cholecalciferol (VITAMIN D) 50 MCG (2000 UT) tablet Take 2,000 Units by mouth daily.     Cyanocobalamin (B-12 PO) Take by mouth.     estradiol (ESTRACE) 0.1 MG/GM vaginal cream Place 1 Applicatorful vaginally at bedtime.     estradiol (ESTRACE) 0.5 MG tablet Take 0.5 tablets (0.25 mg total) by mouth every other day. 45 tablet 3   hyoscyamine (LEVSIN) 0.125 MG tablet Take 1 tablet (0.125 mg total) by mouth every 4 (four) hours as needed for cramping (diarrhea, nausea). 50 tablet 2    medroxyPROGESTERone (PROVERA) 2.5 MG tablet Take 0.5 tablets (1.25 mg total) by mouth every other day. 45 tablet 3   metroNIDAZOLE (FLAGYL) 500 MG tablet Take 1 tablet (500 mg total) by mouth 2 (two) times daily. 14 tablet 0   omeprazole (PRILOSEC) 40 MG capsule TAKE 1 CAPSULE DAILY TO PREVENT HEARTBURN & INDIGESTION 90 capsule 3   oxybutynin (DITROPAN-XL) 10 MG 24 hr tablet Take 10 mg by mouth at bedtime.     rosuvastatin (CRESTOR) 20 MG tablet Take  1 tablet  Daily  for Cholesterol (Patient taking differently: Take 10 mg by mouth daily.) 90 tablet 3   valACYclovir (VALTREX) 500 MG tablet Take 1 tablet (500 mg total) by mouth daily. 90 tablet 3   No current facility-administered medications on file prior to visit.    ALLERGIES: Sulfa antibiotics, Codeine, and Macrobid [nitrofurantoin macrocrystal]  SH:  married, non smoker  Review of Systems  Genitourinary:        Discharge and odor    PHYSICAL EXAMINATION:    BP 129/77 (BP Location: Right Arm, Patient Position: Sitting, Cuff Size: Normal)   Pulse 75   Ht 5' (1.524 m)   Wt 109 lb (49.4 kg)  LMP 01/20/2011   BMI 21.29 kg/m     General appearance: alert, cooperative and appears stated age Lymph:  no inguinal LAD noted  Pelvic: External genitalia:  no lesions              Urethra:  normal appearing urethra with no masses, tenderness or lesions              Bartholins and Skenes: normal                 Vagina: normal appearing vagina with normal color and discharge, no lesions              Cervix:  s/p leep with healing cone bed, minimal spotting, normal discharge noted, no abnormal odor              Chaperone, Octaviano Batty, CMA, was present for exam.  Assessment/Plan: 1. S/P LEEP - cone bed is healing well, odor is normal, no evidence of infection.  Advised pt to stop flaygl now.   - will follow up for 1 month post procedure visit as scheduled  2. Vaginal discharge - reassured pt that exam findings are normal

## 2022-04-13 ENCOUNTER — Other Ambulatory Visit: Payer: Self-pay | Admitting: Nurse Practitioner

## 2022-04-13 DIAGNOSIS — G4700 Insomnia, unspecified: Secondary | ICD-10-CM

## 2022-04-14 ENCOUNTER — Encounter: Payer: Self-pay | Admitting: Nurse Practitioner

## 2022-04-16 ENCOUNTER — Encounter (HOSPITAL_BASED_OUTPATIENT_CLINIC_OR_DEPARTMENT_OTHER): Payer: Self-pay | Admitting: Obstetrics & Gynecology

## 2022-04-22 ENCOUNTER — Ambulatory Visit (HOSPITAL_BASED_OUTPATIENT_CLINIC_OR_DEPARTMENT_OTHER): Payer: 59 | Admitting: Obstetrics & Gynecology

## 2022-04-27 ENCOUNTER — Encounter: Payer: Self-pay | Admitting: Gastroenterology

## 2022-04-30 ENCOUNTER — Encounter (HOSPITAL_BASED_OUTPATIENT_CLINIC_OR_DEPARTMENT_OTHER): Payer: Self-pay | Admitting: Obstetrics & Gynecology

## 2022-05-07 ENCOUNTER — Other Ambulatory Visit (HOSPITAL_BASED_OUTPATIENT_CLINIC_OR_DEPARTMENT_OTHER): Payer: Self-pay | Admitting: Obstetrics & Gynecology

## 2022-05-07 ENCOUNTER — Encounter (HOSPITAL_BASED_OUTPATIENT_CLINIC_OR_DEPARTMENT_OTHER): Payer: Self-pay | Admitting: Obstetrics & Gynecology

## 2022-05-07 DIAGNOSIS — Z79890 Hormone replacement therapy (postmenopausal): Secondary | ICD-10-CM

## 2022-05-20 ENCOUNTER — Other Ambulatory Visit: Payer: Self-pay

## 2022-05-20 DIAGNOSIS — E782 Mixed hyperlipidemia: Secondary | ICD-10-CM

## 2022-05-20 MED ORDER — ROSUVASTATIN CALCIUM 20 MG PO TABS: ORAL_TABLET | ORAL | 3 refills | Status: DC

## 2022-05-24 NOTE — Progress Notes (Signed)
Assessment and Plan:  Patricia Ryan was seen today for abdominal pain.  Diagnoses and all orders for this visit:  Hyperlipidemia, mixed Continue Rosuvastatin , diet and exercise  Body mass index (BMI) 19.9 or less, adult Concentrate on high protein, nutrient rich foods  Right hip pain Continue to follow with orthopedics  Lower abdominal pain Try Bentyl before meals and at bedtime Continue diet modifications Keep upcoming GI appointment -     dicyclomine (BENTYL) 10 MG capsule; Take 1 capsule (10 mg total) by mouth 4 (four) times daily -  before meals and at bedtime.  Gastroesophageal reflux disease without esophagitis Stop Omeprazole and begin Protonix Monitor symptoms and continue diet modifications -     pantoprazole (PROTONIX) 40 MG tablet; Take 1 tablet (40 mg total) by mouth daily.  Upper abdominal pain Keep upcoming GI appointment, will try to see if endoscopy can be done with colonoscopy -     dicyclomine (BENTYL) 10 MG capsule; Take 1 capsule (10 mg total) by mouth 4 (four) times daily -  before meals and at bedtime. -     pantoprazole (PROTONIX) 40 MG tablet; Take 1 tablet (40 mg total) by mouth daily. -     Ambulatory referral to Gastroenterology -     CBC with Differential/Platelet -     COMPLETE METABOLIC PANEL WITH GFR  Medication management -     CBC with Differential/Platelet -     COMPLETE METABOLIC PANEL WITH GFR       Further disposition pending results of labs. Discussed med's effects and SE's.   Over 30 minutes of exam, counseling, chart review, and critical decision making was performed.   Future Appointments  Date Time Provider Department Center  06/28/2022 10:00 AM [facility]  07/15/2022  8:00 AM [name], Andreas Blower, MD [facility]  08/13/2022 10:30 AM Raynelle Dick, NP [facility] None  10/25/2022  3:15 PM Jerene Bears, MD [facility]  02/10/2023  2:15 PM Jerene Bears, MD [facility]  02/14/2023  9:00 AM  Raynelle Dick, NP [facility] None    ------------------------------------------------------------------------------------------------------------------   HPI BP 110/68   Pulse 66   Temp (!) 97.3 F (36.3 C)   Ht 5' 3.5" (1.613 m)   Wt 105 lb 12.8 oz (48 kg)   LMP 01/20/2011   SpO2 97%   BMI 18.45 kg/m   67 y.o.female presents for complaints of upper abdominal pain- describes as pressure diffuse lower abdominal pain and spreads upward.  Patricia Ryan has colonoscopy 07/15/22 is scheduled.  Patricia Ryan will use Mylanta, Gas X and omeprazole and none are helping.  Patricia Ryan is moving her bowels but a little more mushy. Patricia Ryan has been eating well. Eating can make the pain worse in abdomen.   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  Continues to have pain with IT band on that side- continues to follow with orthopedics and had steroid injection 05/06/22.    BP is well controlled without medication. Denies headaches, chest pain, shortness of breath and dizziness BP Readings from Last 3 Encounters:  05/25/22 110/68  04/08/22 129/77  03/25/22 130/62   BMI is Body mass index is 18.45 kg/m., Patricia Ryan has been working on diet and exercise. Wt Readings from Last 3 Encounters:  05/25/22 105 lb 12.8 oz (48 kg)  04/08/22 109 lb (49.4 kg)  03/25/22 108 lb 3.2 oz (49.1 kg)    Patricia Ryan is currently on Rosuvastatin 20 mg 1/2  tab  QD and cholesterol is close to goal. 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   History of kidney stones    HSV-2 (herpes simplex  virus 2) infection    Hyperlipidemia    IBS (irritable bowel syndrome)    Interstitial cystitis    Nevus    vulva nevus   Prediabetes      Allergies  Allergen Reactions   Sulfa Antibiotics Hives, Shortness Of Breath and Rash   Codeine Nausea And Vomiting   Macrobid [Nitrofurantoin Macrocrystal] Nausea Only    Current Outpatient Medications on File Prior to Visit  Medication Sig   ALPRAZolam (XANAX) 0.25 MG tablet TAKE 1/2 TO 1 TABLET BY MOUTH AT BEDTIME ONLY AS NEEDED FOR SLEEP LIMIT TO 5 DAYS PER WEEK TO AVOID ADDICTION AND DEMENTIA   ascorbic acid (VITAMIN C) 500 MG tablet Take 250 mg by mouth every other day.   bimatoprost (LATISSE) 0.03 % ophthalmic solution Apply 1 drop to eye at bedtime.   CALCIUM CITRATE PO Take 1 tablet by mouth daily.   Cholecalciferol (VITAMIN D) 50 MCG (2000 UT) tablet Take 2,000 Units by mouth daily.   Cyanocobalamin (B-12 PO) Take by mouth.   Docusate Calcium (STOOL SOFTENER PO) Take by mouth.   estradiol (ESTRACE) 0.1 MG/GM vaginal cream Place 1 Applicatorful vaginally at bedtime.   estradiol (ESTRACE) 0.5 MG tablet Take 0.5 tablets (0.25 mg total) by mouth every other day.   hyoscyamine (LEVSIN) 0.125 MG tablet Take 1 tablet (0.125 mg total) by mouth every 4 (four) hours as needed for cramping (diarrhea, nausea).   medroxyPROGESTERone (PROVERA) 2.5 MG tablet Take 0.5 tablets (1.25 mg total) by mouth every other day.   omeprazole (PRILOSEC) 40 MG capsule TAKE 1 CAPSULE DAILY TO PREVENT HEARTBURN & INDIGESTION   oxybutynin (DITROPAN-XL) 10 MG 24 hr tablet Take 10 mg by mouth at bedtime.   polyethylene glycol powder (GLYCOLAX/MIRALAX) 17 GM/SCOOP powder as needed.   rosuvastatin (CRESTOR) 20 MG tablet Take  1 tablet  Daily  for Cholesterol   valACYclovir (VALTREX) 500 MG tablet Take 1 tablet (500 mg total) by mouth daily.   metroNIDAZOLE (FLAGYL) 500 MG tablet Take 1 tablet (500 mg total) by mouth 2 (two) times daily. (Patient not taking: Reported on  05/25/2022)   No current facility-administered medications on file prior to visit.    ROS: all negative except above.   Physical Exam:  BP 110/68   Pulse 66   Temp (!) 97.3 F (36.3 C)   Ht 5' 3.5" (1.613 m)   Wt 105 lb 12.8 oz (48 kg)   LMP 01/20/2011   SpO2 97%   BMI 18.45 kg/m   General Appearance: Well nourished, in no apparent distress. Eyes: PERRLA, EOMs, conjunctiva no swelling or erythema Sinuses: No Frontal/maxillary tenderness ENT/Mouth: Ext aud canals clear, TMs without erythema, bulging. No erythema, swelling, or exudate on  post pharynx.  Tonsils not swollen or erythematous. Hearing normal.  Neck: Supple, thyroid normal.  Respiratory: Respiratory effort normal, BS equal bilaterally without rales, rhonchi, wheezing or stridor.  Cardio: RRR with no MRGs. Brisk peripheral pulses without edema.  Abdomen: Soft, + BS. Very mild tenderness throughout abdomen Lymphatics: Non tender without lymphadenopathy.  Musculoskeletal: Full ROM, 5/5 strength, normal gait.  Skin: Warm, dry without rashes, lesions, ecchymosis.  Neuro: Cranial nerves intact. Normal muscle tone, no cerebellar symptoms. Sensation intact.  Psych: Awake and oriented X 3, normal affect, Insight and Judgment appropriate.     Raynelle Dick, NP 4:16 PM [facility]

## 2022-05-25 ENCOUNTER — Ambulatory Visit (INDEPENDENT_AMBULATORY_CARE_PROVIDER_SITE_OTHER): Payer: Medicare Other | Admitting: Nurse Practitioner

## 2022-05-25 ENCOUNTER — Encounter: Payer: Self-pay | Admitting: Nurse Practitioner

## 2022-05-25 VITALS — BP 110/68 | HR 66 | Temp 97.3°F | Ht 63.5 in | Wt 105.8 lb

## 2022-05-25 DIAGNOSIS — Z681 Body mass index (BMI) 19 or less, adult: Secondary | ICD-10-CM | POA: Diagnosis not present

## 2022-05-25 DIAGNOSIS — M25551 Pain in right hip: Secondary | ICD-10-CM

## 2022-05-25 DIAGNOSIS — R1030 Lower abdominal pain, unspecified: Secondary | ICD-10-CM

## 2022-05-25 DIAGNOSIS — E782 Mixed hyperlipidemia: Secondary | ICD-10-CM | POA: Diagnosis not present

## 2022-05-25 DIAGNOSIS — R1010 Upper abdominal pain, unspecified: Secondary | ICD-10-CM

## 2022-05-25 DIAGNOSIS — Z79899 Other long term (current) drug therapy: Secondary | ICD-10-CM

## 2022-05-25 DIAGNOSIS — K219 Gastro-esophageal reflux disease without esophagitis: Secondary | ICD-10-CM

## 2022-05-25 LAB — CBC WITH DIFFERENTIAL/PLATELET
Eosinophils Absolute: 78 cells/uL (ref 15–500)
Eosinophils Relative: 1.1 %
HCT: 44 % (ref 35.0–45.0)
Lymphs Abs: 1896 cells/uL (ref 850–3900)
MCH: 28.9 pg (ref 27.0–33.0)
RBC: 5.01 10*6/uL (ref 3.80–5.10)
Total Lymphocyte: 26.7 %
WBC: 7.1 10*3/uL (ref 3.8–10.8)

## 2022-05-25 MED ORDER — PANTOPRAZOLE SODIUM 40 MG PO TBEC
40.0000 mg | DELAYED_RELEASE_TABLET | Freq: Every day | ORAL | 1 refills | Status: DC
Start: 2022-05-25 — End: 2022-07-22

## 2022-05-25 MED ORDER — DICYCLOMINE HCL 10 MG PO CAPS
10.0000 mg | ORAL_CAPSULE | Freq: Three times a day (TID) | ORAL | 1 refills | Status: DC
Start: 2022-05-25 — End: 2023-07-26

## 2022-05-25 NOTE — Patient Instructions (Signed)
Take Dicyclomine before meals and at bedtime as needed  Stop omeprazole and start Protonix daily   Dicyclomine Capsules or Tablets What is this medication? DICYCLOMINE (dye SYE kloe meen) treats irritable bowel syndrome (IBS). It works by relaxing the muscles of your stomach and bowel, which reduces cramping. It belongs to a group of medications called antispasmodics. This medicine may be used for other purposes; ask your health care provider or pharmacist if you have questions. COMMON BRAND NAME(S): Bentyl What should I tell my care team before I take this medication? They need to know if you have any of these conditions: Difficulty passing urine Esophagus problems or heartburn Glaucoma Heart disease, or previous heart attack Myasthenia gravis Prostate trouble Stomach infection, or obstruction Ulcerative colitis An unusual or allergic reaction to dicyclomine, other medications, foods, dyes, or preservatives Pregnant or trying to get pregnant Breast-feeding How should I use this medication? Take this medication by mouth with a glass of water. Follow the directions on the prescription label. It is best to take this medication on an empty stomach, 30 minutes to 1 hour before meals. Take your medication at regular intervals. Do not take your medication more often than directed. Talk to your care team about the use of this medication in children. Special care may be needed. While this medication may be prescribed for children as young as 21 months of age for selected conditions, precautions do apply. Patients over 37 years old may have a stronger reaction and need a smaller dose. Overdosage: If you think you have taken too much of this medicine contact a poison control center or emergency room at once. NOTE: This medicine is only for you. Do not share this medicine with others. What if I miss a dose? If you miss a dose, take it as soon as you can. If it is almost time for your next dose,  take only that dose. Do not take double or extra doses. What may interact with this medication? Amantadine Antacids Benztropine Digoxin Disopyramide Medications for allergies, colds and breathing difficulties Medications for Alzheimer's disease Medications for anxiety or sleeping problems Medications for depression or mental health conditions Medications for diarrhea Medications for pain Metoclopramide Tegaserod This list may not describe all possible interactions. Give your health care provider a list of all the medicines, herbs, non-prescription drugs, or dietary supplements you use. Also tell them if you smoke, drink alcohol, or use illegal drugs. Some items may interact with your medicine. What should I watch for while using this medication? You may get drowsy, dizzy, or have blurred vision. Do not drive, use machinery, or do anything that needs mental alertness until you know how this medication affects you. To reduce the risk of dizzy or fainting spells, do not sit or stand up quickly, especially if you are an older patient. Alcohol can make you more drowsy, avoid alcoholic drinks. Stay out of bright light and wear sunglasses if this medication makes your eyes more sensitive to light. Avoid extreme heat (hot tubs, saunas). This medication can cause you to sweat less than normal. Your body temperature could increase to dangerous levels, which may lead to heat stroke. Antacids can stop this medication from working. If you get an upset stomach and want to take an antacid, make sure there is an interval of at least 1 to 2 hours before or after you take this medication. Your mouth may get dry. Chewing sugarless gum or sucking hard candy, and drinking plenty of water may help. Contact  your care team if the problem does not go away or is severe. What side effects may I notice from receiving this medication? Side effects that you should report to your care team as soon as possible: Allergic  reactions--skin rash, itching, hives, swelling of the face, lips, tongue, or throat Anticholinergic toxicity--flushed face, blurry vision, dry mouth and skin, confusion, fast or irregular heartbeat, trouble passing urine, constipation Bowel blockage--stomach cramping, unable to have a bowel movement or pass gas, loss of appetite, vomiting Side effects that usually do not require medical attention (report to your care team if they continue or are bothersome): Blurry vision Confusion Constipation Dizziness Drowsiness Dry mouth Nausea This list may not describe all possible side effects. Call your doctor for medical advice about side effects. You may report side effects to FDA at 1-800-FDA-1088. Where should I keep my medication? Keep out of the reach of children and pets. Store at room temperature below 30 degrees C (86 degrees F). Protect from light. Throw away any unused medication after the expiration date. NOTE: This sheet is a summary. It may not cover all possible information. If you have questions about this medicine, talk to your doctor, pharmacist, or health care provider.  2023 Elsevier/Gold Standard (2020-09-11 00:00:00)

## 2022-05-26 ENCOUNTER — Other Ambulatory Visit: Payer: Self-pay | Admitting: Nurse Practitioner

## 2022-05-26 DIAGNOSIS — E875 Hyperkalemia: Secondary | ICD-10-CM

## 2022-05-26 LAB — COMPLETE METABOLIC PANEL WITH GFR
AG Ratio: 1.9 (calc) (ref 1.0–2.5)
ALT: 15 U/L (ref 6–29)
AST: 13 U/L (ref 10–35)
Albumin: 4.5 g/dL (ref 3.6–5.1)
Alkaline phosphatase (APISO): 74 U/L (ref 37–153)
BUN: 21 mg/dL (ref 7–25)
CO2: 31 mmol/L (ref 20–32)
Calcium: 10.2 mg/dL (ref 8.6–10.4)
Chloride: 103 mmol/L (ref 98–110)
Creat: 0.67 mg/dL (ref 0.50–1.05)
Globulin: 2.4 g/dL (calc) (ref 1.9–3.7)
Glucose, Bld: 98 mg/dL (ref 65–99)
Potassium: 5.7 mmol/L — ABNORMAL HIGH (ref 3.5–5.3)
Sodium: 142 mmol/L (ref 135–146)
Total Bilirubin: 0.4 mg/dL (ref 0.2–1.2)
Total Protein: 6.9 g/dL (ref 6.1–8.1)
eGFR: 97 mL/min/{1.73_m2} (ref >=60)

## 2022-05-26 LAB — CBC WITH DIFFERENTIAL/PLATELET
Absolute Monocytes: 738 cells/uL (ref 200–950)
Basophils Absolute: 50 cells/uL (ref 0–200)
Basophils Relative: 0.7 %
Hemoglobin: 14.5 g/dL (ref 11.7–15.5)
MCHC: 33 g/dL (ref 32.0–36.0)
MCV: 87.8 fL (ref 80.0–100.0)
MPV: 10.2 fL (ref 7.5–12.5)
Monocytes Relative: 10.4 %
Neutro Abs: 4338 cells/uL (ref 1500–7800)
Neutrophils Relative %: 61.1 %
Platelets: 256 10*3/uL (ref 140–400)
RDW: 12.6 % (ref 11.0–15.0)

## 2022-05-30 ENCOUNTER — Other Ambulatory Visit: Payer: Self-pay | Admitting: Nurse Practitioner

## 2022-05-30 DIAGNOSIS — G4700 Insomnia, unspecified: Secondary | ICD-10-CM

## 2022-06-07 ENCOUNTER — Ambulatory Visit (INDEPENDENT_AMBULATORY_CARE_PROVIDER_SITE_OTHER): Payer: Medicare Other

## 2022-06-07 ENCOUNTER — Encounter: Payer: Self-pay | Admitting: Nurse Practitioner

## 2022-06-07 DIAGNOSIS — E875 Hyperkalemia: Secondary | ICD-10-CM | POA: Diagnosis not present

## 2022-06-07 NOTE — Progress Notes (Signed)
Patient presents to the office for a nurse visit to check potassium levels. Patient states that she is having extreme palpitations. Provider will be notified. Patient will also send a MyChart message.

## 2022-06-08 ENCOUNTER — Ambulatory Visit: Payer: Medicare Other

## 2022-06-08 ENCOUNTER — Encounter: Payer: Self-pay | Admitting: Nurse Practitioner

## 2022-06-08 LAB — BASIC METABOLIC PANEL WITH GFR
BUN: 11 mg/dL (ref 7–25)
CO2: 30 mmol/L (ref 20–32)
Calcium: 10.1 mg/dL (ref 8.6–10.4)
Chloride: 103 mmol/L (ref 98–110)
Creat: 0.64 mg/dL (ref 0.50–1.05)
Glucose, Bld: 100 mg/dL — ABNORMAL HIGH (ref 65–99)
Potassium: 5 mmol/L (ref 3.5–5.3)
Sodium: 143 mmol/L (ref 135–146)
eGFR: 98 mL/min/{1.73_m2} (ref >=60)

## 2022-06-10 NOTE — Progress Notes (Signed)
Assessment and Plan:  There are no diagnoses linked to this encounter. Patricia Ryan was seen today for palpitations.  Diagnoses and all orders for this visit:  Palpitations Continue to monitor and use Diltiazem as needed for palpitations and have referral to cardiology -     EKG 12-Lead -     Ambulatory referral to Cardiology -     diltiazem (CARDIZEM) 30 MG tablet; Take 1 tab twice a day as needed for palpitations  Anxiety state Continue Xanax 0.25 mg daily as needed Monitor symptoms  Family history of early CAD Monitor symptoms and see cardiology for evaluation -     Ambulatory referral to Cardiology  BMI 19 or less in adult Reviewed high protein , nutrient dense foods Monitor   Further disposition pending results of labs. Discussed med's effects and SE's.   Over 30 minutes of exam, counseling, chart review, and critical decision making was performed.   Future Appointments  Date Time Provider Department Center  06/28/2022 10:00 AM [facility] PRE[facility] 52 [facility]  07/15/2022  8:00 AM [name], Andreas Blower, MD [facility]  08/13/2022 10:30 AM Raynelle Dick, NP [facility] None  10/25/2022  3:15 PM Jerene Bears, MD [facility]  02/10/2023  2:15 PM Jerene Bears, MD [facility]  02/14/2023  9:00 AM Raynelle Dick, NP [facility] None    ------------------------------------------------------------------------------------------------------------------   HPI BP 98/62   Pulse 73   Temp 97.6 F (36.4 C)   Ht 5\' 3"  (1.6 m)   Wt 104 lb 3.2 oz (47.3 kg)   LMP 01/20/2011   SpO2 99%   BMI 18.46 kg/m  67 y.o.female presents for evaluation of heart palpitations.  On 06/07/22 pt sent message stating "Over the past 2 weeks since I came in I have been having what feels like terrible heart palpatations. These are very uncomfortable to me and feel bad. I am stressing alot nowadays, but I usually have some anxiety. Thats just my life. I use to take meds years ago for the  palpitations and did so for about 5 years. Got off the meds and have not had anything for years. But it seems to be getting worse than it has been. I only take 1/2 Xanax at night. Do not wish to take during the day. My dad has had strokes and heart by pass and mother suffers from Angina and some congestive heart failure. Should I be checked for this beyond palpitation meds "  She previously used diltiazem 30 mg BID as needed for palpitations in 2021.   BP is well controlled without medication. Has been experiencing above mentioned palpitations BP Readings from Last 3 Encounters:  06/11/22 98/62  06/07/22 128/72  05/25/22 110/68   BMI is Body mass index is 18.46 kg/m., she has been working on diet and exercise.States she is eating but continues to lose weight.  Has upcoming colonoscopy/endoscopy Wt Readings from Last 3 Encounters:  06/11/22 104 lb 3.2 oz (47.3 kg)  06/07/22 105 lb 3.2 oz (47.7 kg)  05/25/22 105 lb 12.8 oz (48 kg)   She does have a history of anxiety and is currently on Alprazolam 0.25 mg 1/2 tab qd as needed.  Has never taken SSRI medication.   Past Medical History:  Diagnosis Date   Allergy    ANAL FISSURE, HX OF 09/24/2008   Qualifier: Diagnosis of  By: Katrinka Blazing CMA, Kelly     Anxiety    Arthritis    Cancer [facility])  skin cancer   Fibroids    Uterine   GERD (gastroesophageal reflux disease)    Hepatitis    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   History of kidney stones    HSV-2 (herpes simplex virus 2) infection    Hyperlipidemia    IBS (irritable bowel syndrome)    Interstitial cystitis    Nevus    vulva nevus   Prediabetes      Allergies  Allergen Reactions   Sulfa Antibiotics Hives, Shortness Of Breath and Rash   Codeine Nausea And Vomiting   Macrobid [Nitrofurantoin Macrocrystal] Nausea Only    Current Outpatient Medications on File Prior to Visit  Medication Sig   ALPRAZolam (XANAX) 0.25 MG tablet TAKE 1/2 TO 1 TABLET BY MOUTH AT BEDTIME  ONLY AS NEEDED FOR SLEEP. LIMIT TO 5 DAYS PER WEEK TO AVOID ADDICTION AND DEMENTIA.   ascorbic acid (VITAMIN C) 500 MG tablet Take 250 mg by mouth every other day.   bimatoprost (LATISSE) 0.03 % ophthalmic solution Apply 1 drop to eye at bedtime.   CALCIUM CITRATE PO Take 1 tablet by mouth daily.   Cholecalciferol (VITAMIN D) 50 MCG (2000 UT) tablet Take 2,000 Units by mouth daily.   Cyanocobalamin (B-12 PO) Take by mouth.   dicyclomine (BENTYL) 10 MG capsule Take 1 capsule (10 mg total) by mouth 4 (four) times daily -  before meals and at bedtime.   Docusate Calcium (STOOL SOFTENER PO) Take by mouth.   estradiol (ESTRACE) 0.1 MG/GM vaginal cream Place 1 Applicatorful vaginally at bedtime.   estradiol (ESTRACE) 0.5 MG tablet Take 0.5 tablets (0.25 mg total) by mouth every other day.   hyoscyamine (LEVSIN) 0.125 MG tablet Take 1 tablet (0.125 mg total) by mouth every 4 (four) hours as needed for cramping (diarrhea, nausea).   metroNIDAZOLE (FLAGYL) 500 MG tablet Take 1 tablet (500 mg total) by mouth 2 (two) times daily.   oxybutynin (DITROPAN-XL) 10 MG 24 hr tablet Take 10 mg by mouth at bedtime.   pantoprazole (PROTONIX) 40 MG tablet Take 1 tablet (40 mg total) by mouth daily.   polyethylene glycol powder (GLYCOLAX/MIRALAX) 17 GM/SCOOP powder as needed.   rosuvastatin (CRESTOR) 20 MG tablet Take  1 tablet  Daily  for Cholesterol   valACYclovir (VALTREX) 500 MG tablet Take 1 tablet (500 mg total) by mouth daily.   medroxyPROGESTERone (PROVERA) 2.5 MG tablet Take 0.5 tablets (1.25 mg total) by mouth every other day.   omeprazole (PRILOSEC) 40 MG capsule TAKE 1 CAPSULE DAILY TO PREVENT HEARTBURN & INDIGESTION   No current facility-administered medications on file prior to visit.    ROS: all negative except above.   Physical Exam:  BP 98/62   Pulse 73   Temp 97.6 F (36.4 C)   Ht 5\' 3"  (1.6 m)   Wt 104 lb 3.2 oz (47.3 kg)   LMP 01/20/2011   SpO2 99%   BMI 18.46 kg/m   General  Appearance: Pleasant thin female, in no apparent distress. Eyes: PERRLA, EOMs, conjunctiva no swelling or erythema Sinuses: No Frontal/maxillary tenderness ENT/Mouth: Ext aud canals clear, TMs without erythema, bulging. No erythema, swelling, or exudate on post pharynx.  Tonsils not swollen or erythematous. Hearing normal.  Neck: Supple, thyroid normal.  Respiratory: Respiratory effort normal, BS equal bilaterally without rales, rhonchi, wheezing or stridor.  Cardio: RRR with no MRGs. Brisk peripheral pulses without edema.  Abdomen: Soft, + BS.  Non tender, no guarding, rebound, hernias, masses. Lymphatics: Non  tender without lymphadenopathy.  Musculoskeletal: Full ROM, 5/5 strength, normal gait.  Skin: Warm, dry without rashes, lesions, ecchymosis.  Neuro: Cranial nerves intact. Normal muscle tone, no cerebellar symptoms. Sensation intact.  Psych: Awake and oriented X 3, normal affect, Insight and Judgment appropriate.  EKG: NSR, no ST changes  Tammela Bales Hollie Salk, NP 10:15 AM [facility]

## 2022-06-11 ENCOUNTER — Encounter: Payer: Self-pay | Admitting: Nurse Practitioner

## 2022-06-11 ENCOUNTER — Ambulatory Visit (INDEPENDENT_AMBULATORY_CARE_PROVIDER_SITE_OTHER): Payer: Medicare Other | Admitting: Nurse Practitioner

## 2022-06-11 VITALS — BP 98/62 | HR 73 | Temp 97.6°F | Ht 63.0 in | Wt 104.2 lb

## 2022-06-11 DIAGNOSIS — F411 Generalized anxiety disorder: Secondary | ICD-10-CM | POA: Diagnosis not present

## 2022-06-11 DIAGNOSIS — Z681 Body mass index (BMI) 19 or less, adult: Secondary | ICD-10-CM | POA: Diagnosis not present

## 2022-06-11 DIAGNOSIS — Z8249 Family history of ischemic heart disease and other diseases of the circulatory system: Secondary | ICD-10-CM | POA: Diagnosis not present

## 2022-06-11 DIAGNOSIS — R002 Palpitations: Secondary | ICD-10-CM

## 2022-06-11 MED ORDER — DILTIAZEM HCL 30 MG PO TABS
ORAL_TABLET | ORAL | 1 refills | Status: DC
Start: 2022-06-11 — End: 2023-07-26

## 2022-06-11 NOTE — Patient Instructions (Signed)
Diltiazem 30 mg BID as needed for palpitations Monitor BP and symptoms  Refer to cardiology  for further evaluation  Palpitations Palpitations are feelings that your heartbeat is irregular or is faster than normal. It may feel like your heart is fluttering or skipping a beat. Palpitations may be caused by many things, including smoking, caffeine, alcohol, stress, and certain medicines or drugs. Most causes of palpitations are not serious.  However, some palpitations can be a sign of a serious problem. Further tests and a thorough medical history will be done to find the cause of your palpitations. Your provider may order tests such as an ECG, labs, an echocardiogram, or an ambulatory continuous ECG monitor. Follow these instructions at home: Pay attention to any changes in your symptoms. Let your health care provider know about them. Take these actions to help manage your symptoms: Eating and drinking Follow instructions from your health care provider about eating or drinking restrictions. You may need to avoid foods and drinks that may cause palpitations. These may include: Caffeinated coffee, tea, soft drinks, and energy drinks. Chocolate. Alcohol. Diet pills. Lifestyle     Take steps to reduce your stress and anxiety. Things that can help you relax include: Yoga. Mind-body activities, such as deep breathing, meditation, or using words and images to create positive thoughts (guided imagery). Physical activity, such as swimming, jogging, or walking. Tell your health care provider if your palpitations increase with activity. If you have chest pain or shortness of breath with activity, do not continue the activity until you are seen by your health care provider. Biofeedback. This is a method that helps you learn to use your mind to control things in your body, such as your heartbeat. Get plenty of rest and sleep. Keep a regular bed time. Do not use drugs, including cocaine or ecstasy. Do not  use marijuana. Do not use any products that contain nicotine or tobacco. These products include cigarettes, chewing tobacco, and vaping devices, such as e-cigarettes. If you need help quitting, ask your health care provider. General instructions Take over-the-counter and prescription medicines only as told by your health care provider. Keep all follow-up visits. This is important. These may include visits for further testing if palpitations do not go away or get worse. Contact a health care provider if: You continue to have a fast or irregular heartbeat for a long period of time. You notice that your palpitations occur more often. Get help right away if: You have chest pain or shortness of breath. You have a severe headache. You feel dizzy or you faint. These symptoms may represent a serious problem that is an emergency. Do not wait to see if the symptoms will go away. Get medical help right away. Call your local emergency services (911 in the [place] Do not drive yourself to the hospital. Summary Palpitations are feelings that your heartbeat is irregular or is faster than normal. It may feel like your heart is fluttering or skipping a beat. Palpitations may be caused by many things, including smoking, caffeine, alcohol, stress, certain medicines, and drugs. Further tests and a thorough medical history may be done to find the cause of your palpitations. Get help right away if you faint or have chest pain, shortness of breath, severe headache, or dizziness. This information is not intended to replace advice given to you by your health care provider. Make sure you discuss any questions you have with your health care provider. Document Revised: 06/04/2020 Document Reviewed: 06/04/2020 Elsevier Patient Education  2023 Elsevier Inc[employer]

## 2022-07-07 ENCOUNTER — Encounter: Payer: Self-pay | Admitting: Nurse Practitioner

## 2022-07-10 ENCOUNTER — Other Ambulatory Visit: Payer: Self-pay | Admitting: Nurse Practitioner

## 2022-07-10 DIAGNOSIS — G4700 Insomnia, unspecified: Secondary | ICD-10-CM

## 2022-07-13 NOTE — Progress Notes (Signed)
  Cardiology Office Note:   Date:  07/20/2022  ID:  Patricia Ryan, DOB 1955-05-26, MRN [id number]  History of Present Illness:   Patricia Ryan is a 67 y.o. female anxiety, HLD, IBS and GERD who was referred by Anda Kraft, NP for further evaluation of palpitations.  Patient seen on 07/13/22. Note reviewed. Patient reported episodes of palpitations requiring dilt as needed. She now presents to clinic for follow-up.  Today, the patient states that she is overall doing well today. Has a long history of palpitations but symptoms have increased recently. Previously was on diltiazem scheduled for symptoms but this was weaned off several years ago. She was doing well until about 6 months ago when the flutters became stronger. 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 When the episodes occur, they can last intermittently all day. Symptoms can wake her up at night. Has about 2-3 episodes per week. No significant caffeine intake.  Otherwise, no chest pain, SOB, orthopnea or PND. Remains active without exertional symptoms.  Family history: mother and father with stroke, sister with stroke  Past Medical History:  Diagnosis Date   Allergy    ANAL FISSURE, HX OF 09/24/2008   Qualifier: Diagnosis of  By: Katrinka Blazing CMA, Tresa Endo     Anxiety    Arthritis    Cancer [facility])    skin cancer   Fibroids    Uterine   GERD (gastroesophageal reflux disease)    Hepatitis    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   History of kidney stones    HSV-2 (herpes simplex virus 2) infection    Hyperlipidemia    IBS (irritable bowel syndrome)    Interstitial cystitis    Nevus    vulva nevus   Prediabetes      ROS: As per HPI  Studies Reviewed:    EKG:  No new tracing  Cardiac Studies & Procedures         MONITORS  LONG TERM MONITOR (3-14 DAYS) 06/14/2019  Narrative  Normal sinus rhythm with rare PACs and  PVCs.  No pathologic arrhythmias.            Risk Assessment/Calculations:              Physical Exam:   VS:  BP 132/78   Pulse 62   Ht 5\' 3"  (1.6 m)   Wt 106 lb 3.2 oz (48.2 kg)   LMP 01/20/2011   SpO2 98%   BMI 18.81 kg/m    Wt Readings from Last 3 Encounters:  07/20/22 106 lb 3.2 oz (48.2 kg)  06/11/22 104 lb 3.2 oz (47.3 kg)  06/07/22 105 lb 3.2 oz (47.7 kg)     GEN: Well nourished, well developed in no acute distress NECK: No JVD; No carotid bruits CARDIAC: RRR, no murmurs, rubs, gallops RESPIRATORY:  Clear to auscultation without rales, wheezing or rhonchi  ABDOMEN: Soft, non-tender, non-distended EXTREMITIES:  No edema; No deformity   ASSESSMENT AND PLAN:   #Palpitations: -Cardiac monitor in 05/2019 with rare PACs and Pvcs -Now with increase in palpitations for the past 6 months -Plan for 7 day zio patch -Check TTE -Continue dilt 30mg  BID prn -Increase hydration -Cut back on caffeine  #HLD: -Continue crestor 20mg  daily -LDL 84        Signed, Meriam Sprague, MD

## 2022-07-15 ENCOUNTER — Encounter: Payer: Medicare Other | Admitting: Gastroenterology

## 2022-07-20 ENCOUNTER — Encounter: Payer: Self-pay | Admitting: Cardiology

## 2022-07-20 ENCOUNTER — Ambulatory Visit (INDEPENDENT_AMBULATORY_CARE_PROVIDER_SITE_OTHER): Payer: Medicare Other

## 2022-07-20 ENCOUNTER — Telehealth: Payer: Self-pay | Admitting: *Deleted

## 2022-07-20 ENCOUNTER — Ambulatory Visit: Payer: Medicare Other | Attending: Cardiology | Admitting: Cardiology

## 2022-07-20 VITALS — BP 132/78 | HR 62 | Ht 63.0 in | Wt 106.2 lb

## 2022-07-20 DIAGNOSIS — R002 Palpitations: Secondary | ICD-10-CM

## 2022-07-20 DIAGNOSIS — E782 Mixed hyperlipidemia: Secondary | ICD-10-CM

## 2022-07-20 NOTE — Telephone Encounter (Signed)
-----   Message from Ernst Bowler sent at 07/20/2022  9:18 AM EDT ----- Regarding: RE: 7 DAY ZIO PER DR. Shari Prows done ----- Message ----- From: Loa Socks, LPN Sent: 02/24/8655   9:10 AM EDT To: Ernst Bowler; Katrina Claria Dice Subject: 7 DAY ZIO PER DR. Shari Prows                    Dr. Shari Prows ordered a 7 day zio for palps  Please enroll and let me know when you do?  Thanks [employer]

## 2022-07-20 NOTE — Progress Notes (Unsigned)
Enrolled for [employer] to mail a ZIO XT long term holter monitor to the patients address on file.  

## 2022-07-20 NOTE — Patient Instructions (Signed)
Medication Instructions:   Your physician recommends that you continue on your current medications as directed. Please refer to the Current Medication list given to you today.  *If you need a refill on your cardiac medications before your next appointment, please call your pharmacy*   Testing/Procedures:  Your physician has requested that you have an echocardiogram. Echocardiography is a painless test that uses sound waves to create images of your heart. It provides your doctor with information about the size and shape of your heart and how well your heart's chambers and valves are working. This procedure takes approximately one hour. There are no restrictions for this procedure. Please do NOT wear cologne, perfume, aftershave, or lotions (deodorant is allowed). Please arrive 15 minutes prior to your appointment time.    ZIO XT- Long Term Monitor Instructions  Your physician has requested you wear a ZIO patch monitor for 7  days.  This is a single patch monitor. [employer] supplies one patch monitor per enrollment. Additional stickers are not available. Please do not apply patch if you will be having a Nuclear Stress Test,  Echocardiogram, Cardiac CT, MRI, or Chest Xray during the period you would be wearing the  monitor. The patch cannot be worn during these tests. You cannot remove and re-apply the  ZIO XT patch monitor.  Your ZIO patch monitor will be mailed 3 day USPS to your address on file. It may take 3-5 days  to receive your monitor after you have been enrolled.  Once you have received your monitor, please review the enclosed instructions. Your monitor  has already been registered assigning a specific monitor serial # to you.  Billing and Patient Assistance Program Information  We have supplied [employer] with any of your insurance information on file for billing purposes. [employer] offers a sliding scale Patient Assistance Program for patients that do not have  insurance, or whose  insurance does not completely cover the cost of the ZIO monitor.  You must apply for the Patient Assistance Program to qualify for this discounted rate.  To apply, please call [employer] at [phone], select option 4, select option 2, ask to apply for  Patient Assistance Program. Meredeth Ide will ask your household income, and how many people  are in your household. They will quote your out-of-pocket cost based on that information.  [employer] will also be able to set up a 67-month, interest-free payment plan if needed.  Applying the monitor   Shave hair from upper left chest.  Hold abrader disc by orange tab. Rub abrader in 40 strokes over the upper left chest as  indicated in your monitor instructions.  Clean area with 4 enclosed alcohol pads. Let dry.  Apply patch as indicated in monitor instructions. Patch will be placed under collarbone on left  side of chest with arrow pointing upward.  Rub patch adhesive wings for 2 minutes. Remove white label marked "1". Remove the white  label marked "2". Rub patch adhesive wings for 2 additional minutes.  While looking in a mirror, press and release button in center of patch. A small green light will  flash 3-4 times. This will be your only indicator that the monitor has been turned on.  Do not shower for the first 24 hours. You may shower after the first 24 hours.  Press the button if you feel a symptom. You will hear a small click. Record Date, Time and  Symptom in the Patient Logbook.  When you are ready to remove the patch, follow  instructions on the last 2 pages of Patient  Logbook. Stick patch monitor onto the last page of Patient Logbook.  Place Patient Logbook in the blue and white box. Use locking tab on box and tape box closed  securely. The blue and white box has prepaid postage on it. Please place it in the mailbox as  soon as possible. Your physician should have your test results approximately 7 days after the  monitor has been mailed back  to [facility].  Call [facility] Customer Care at [phone] if you have questions regarding  your ZIO XT patch monitor. Call them immediately if you see an orange light blinking on your  monitor.  If your monitor falls off in less than 4 days, contact our Monitor department at [phone].  If your monitor becomes loose or falls off after 4 days call [employer] at [phone] for  suggestions on securing your monitor    Follow-Up: At [facility], you and your health needs are our priority.  As part of our continuing mission to provide you with exceptional heart care, we have created designated Provider Care Teams.  These Care Teams include your primary Cardiologist (physician) and Advanced Practice Providers (APPs -  Physician Assistants and Nurse Practitioners) who all work together to provide you with the care you need, when you need it.  We recommend signing up for the patient portal called "MyChart".  Sign up information is provided on this After Visit Summary.  MyChart is used to connect with patients for Virtual Visits (Telemedicine).  Patients are able to view lab/test results, encounter notes, upcoming appointments, etc.  Non-urgent messages can be sent to your provider as well.   To learn more about what you can do with MyChart, go to [email or web address].    Your next appointment:   1 year(s)  Provider:   Dr. Antoine Poche

## 2022-07-22 ENCOUNTER — Other Ambulatory Visit: Payer: Self-pay | Admitting: Nurse Practitioner

## 2022-07-22 DIAGNOSIS — K219 Gastro-esophageal reflux disease without esophagitis: Secondary | ICD-10-CM

## 2022-07-22 DIAGNOSIS — R1010 Upper abdominal pain, unspecified: Secondary | ICD-10-CM

## 2022-07-23 DIAGNOSIS — R002 Palpitations: Secondary | ICD-10-CM | POA: Diagnosis not present

## 2022-07-30 ENCOUNTER — Ambulatory Visit: Payer: Self-pay | Admitting: Cardiology

## 2022-08-06 NOTE — Progress Notes (Signed)
08/12/2022 Patricia Ryan [id number] 08-Nov-1955  Referring provider: Raynelle Dick, NP Primary GI doctor: Dr. Myrtie Neither  ASSESSMENT AND PLAN:   History of colonic polyps 06/2012 colonoscopy with 1 sessile polyp recall 10 years. Will schedule for repeat colonoscopy with endoscopy at [facility] We have discussed the risks of bleeding, infection, perforation, medication reactions, and remote risk of death associated with colonoscopy. All questions were answered and the patient acknowledges these risk and wishes to proceed.  Irritable bowel syndrome with constipation - Increase fiber/ water intake, decrease caffeine, increase activity level. -Will add on Miralax daily and Benefiber  RUQ abdominal pain,  dyspepsia, GERD, intermittent dysphagia, history of elevated LFTs 09/14/2019 EGD unremarkable biopsies negative H. Pylori 06/12/2021 right upper quadrant abdominal ultrasound hepatic steatosis mild biliary ductal dilation likely from previous cholecystectomy, 09/2019 MRCP without CBD With new symptoms we will plan on repeat right upper quadrant ultrasound, check LFTs Increase pantoprazole to twice daily GERD information given in AVS Patient also has follow-up with cardiology was having palpitations, while the symptoms seem nonexertional, patient has echocardiogram 7/23, will schedule EGD and colonoscopy after that to evaluate cardiac cause. EGD with possible dilation since patient is having intermittent dysphagia, appropriate for LEC with Dr. Myrtie Neither.  I discussed risks of EGD with patient today, including risk of sedation, bleeding or perforation.  Patient provides understanding and gave verbal consent to proceed.  Recurrent UTI Patient has recurrent UTIs, gets antibiotics intermittently, has abdominal bloating, increased gas, constipation, can consider SIBO testing or empiric treatment is high risk with repeated antibiotic exposure.    Patient Care Team: Raynelle Dick, NP as PCP -  General (Nurse Practitioner) Hart Carwin, MD (Inactive) as Consulting Physician (Gastroenterology)  HISTORY OF PRESENT ILLNESS: 67 y.o. female with a past medical history of IBS, ICS, constipation, B12 deficiency and others listed below presents for evaluation of dyspepsia.   07/12/2012 colonoscopy sessile polyp 3 to 5 mm rectum otherwise unremarkable, good movie prep recall 10 years 09/14/2019 EGD normal biopsies negative for H. Pylori 09/2019 MRCP no CBD stone 08/11/2020 office visit with Dr. Myrtie Neither for right lower quadrant pain 06/12/2021 abdominal ultrasound for elevated LFTs, ALT 42, AST 17, alk phos 68, total bilirubin 0.3,  LFTs have been unremarkable since that time.   Showed increased echogenicity of liver may present hepatic steatosis, mild biliary ductal dilation which may be compensatory from cholecystectomy correlate and consider MRCP  She had colon scheduled June 20th due to her current symptoms.  Patient reports GERD symptoms worsening for 4-6 months. Sometimes burning into her esophagus, most of the time a tense epigastric pressure occ radiation to her back, can radiate into chest and RUQ.  She has associated nausea but no vomiting, took ondansetron that helped.  Having symptoms daily, no food specific foods.  She had ginger cookie last night and felt a lot of pressure distal esophagus and feels a fullness/pressure or he has trapped air.  Feels if she can just burp would feel better.   She switched from omeprazole to pantoprazole 40 mg once a day in the AM right before food for 2 months, no help.  She has occ dysphagia with meats and breads, vitamin C pills.  No Melena.  She stays constipated, will have BM every 2-3 days. She took suppository. She will take miralax  No unintentional weight loss, no night sweats.  She has had palpitation and had zio patch with PVCs, getting echocardiogram Tuesday.  She has recurrent UTI, on amoxicillin  since 07/11.   She reports NSAID use, a  few times a week.  She reports ETOH use, 3 x week a cocktail on weekends.  She denies family history of gallbladder issues.  She denies tobacco use.  She denies drug use.    She  reports that she quit smoking about 10 years ago. Her smoking use included cigarettes. She has never used smokeless tobacco. She reports current alcohol use of about 5.0 standard drinks of alcohol per week. She reports that she does not use drugs.  RELEVANT LABS AND IMAGING: CBC    Component Value Date/Time   WBC 7.1 05/25/2022 1641   RBC 5.01 05/25/2022 1641   HGB 14.5 05/25/2022 1641   HCT 44.0 05/25/2022 1641   PLT 256 05/25/2022 1641   MCV 87.8 05/25/2022 1641   MCV 94.1 12/29/2013 1032   MCH 28.9 05/25/2022 1641   MCHC 33.0 05/25/2022 1641   RDW 12.6 05/25/2022 1641   LYMPHSABS 1,896 05/25/2022 1641   MONOABS 616 02/09/2016 0921   EOSABS 78 05/25/2022 1641   BASOSABS 50 05/25/2022 1641   Recent Labs    12/23/21 1346 02/12/22 0000 05/25/22 1641  HGB 12.8 13.5 14.5    CMP     Component Value Date/Time   NA 143 06/07/2022 1148   K 5.0 06/07/2022 1148   CL 103 06/07/2022 1148   CO2 30 06/07/2022 1148   GLUCOSE 100 (H) 06/07/2022 1148   BUN 11 06/07/2022 1148   CREATININE 0.64 06/07/2022 1148   CALCIUM 10.1 06/07/2022 1148   PROT 6.9 05/25/2022 1641   ALBUMIN 3.8 12/23/2021 1417   AST 13 05/25/2022 1641   ALT 15 05/25/2022 1641   ALKPHOS 49 12/23/2021 1417   BILITOT 0.4 05/25/2022 1641   GFRNONAA >60 12/23/2021 1417   GFRNONAA 94 02/12/2020 1559   GFRAA 109 02/12/2020 1559      Latest Ref Rng & Units 05/25/2022    4:41 PM 02/12/2022   12:00 AM 12/23/2021    2:17 PM  Hepatic Function  Total Protein 6.1 - 8.1 g/dL 6.9  6.8  6.5   Albumin 3.5 - 5.0 g/dL   3.8   AST 10 - 35 U/L 13  16  25    ALT 6 - 29 U/L 15  14  23    Alk Phosphatase 38 - 126 U/L   49   Total Bilirubin 0.2 - 1.2 mg/dL 0.4  0.4  0.5       Latest Ref Rng & Units 06/15/2021   10:27 AM  Hepatitis C  HCV  Quanitative Log log IU/mL <1.18     Current Medications:   Current Outpatient Medications (Endocrine & Metabolic):    estradiol (ESTRACE) 0.5 MG tablet, Take 0.5 tablets (0.25 mg total) by mouth every other day.  Current Outpatient Medications (Cardiovascular):    diltiazem (CARDIZEM) 30 MG tablet, Take 1 tab twice a day as needed for palpitations (Patient taking differently: Take 30 mg by mouth as needed. Take 1 tab twice a day as needed for palpitations)   rosuvastatin (CRESTOR) 20 MG tablet, Take  1 tablet  Daily  for Cholesterol    Current Outpatient Medications (Hematological):    Cyanocobalamin (B-12 PO), Take by mouth.  Current Outpatient Medications (Other):    ALPRAZolam (XANAX) 0.25 MG tablet, TAKE 1/2 TO 1 TABLET BY MOUTH EVERY NIGHT AT BEDTIME ONLY AS NEEDED FOR SLEEP LIMIT TO 5 DAYS PER WEEK TO AVOID ADDICTION AND DEMENTIA   ascorbic acid (VITAMIN C)  500 MG tablet, Take 250 mg by mouth every other day.   bimatoprost (LATISSE) 0.03 % ophthalmic solution, Apply 1 drop to eye at bedtime.   CALCIUM CITRATE PO, Take 1 tablet by mouth daily.   Cholecalciferol (VITAMIN D) 50 MCG (2000 UT) tablet, Take 2,000 Units by mouth daily.   dicyclomine (BENTYL) 10 MG capsule, Take 1 capsule (10 mg total) by mouth 4 (four) times daily -  before meals and at bedtime. (Patient taking differently: Take 10 mg by mouth as needed.)   Docusate Calcium (STOOL SOFTENER PO), Take by mouth.   estradiol (ESTRACE) 0.1 MG/GM vaginal cream, Place 1 Applicatorful vaginally as needed.   hyoscyamine (LEVSIN) 0.125 MG tablet, Take 1 tablet (0.125 mg total) by mouth every 4 (four) hours as needed for cramping (diarrhea, nausea).   oxybutynin (DITROPAN-XL) 10 MG 24 hr tablet, Take 10 mg by mouth at bedtime.   polyethylene glycol powder (GLYCOLAX/MIRALAX) 17 GM/SCOOP powder, as needed.   valACYclovir (VALTREX) 500 MG tablet, Take 1 tablet (500 mg total) by mouth daily.   pantoprazole (PROTONIX) 40 MG tablet, Take  1 tablet (40 mg total) by mouth 2 (two) times daily before a meal.  Medical History:  Past Medical History:  Diagnosis Date   Allergy    ANAL FISSURE, HX OF 09/24/2008   Qualifier: Diagnosis of  By: Katrinka Blazing CMA, Kelly     Anxiety    Arthritis    Cancer [facility])    skin cancer   Fibroids    Uterine   GERD (gastroesophageal reflux disease)    Hepatitis    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   History of kidney stones    HSV-2 (herpes simplex virus 2) infection    Hyperlipidemia    IBS (irritable bowel syndrome)    Interstitial cystitis    Nevus    vulva nevus   Prediabetes    Allergies:  Allergies  Allergen Reactions   Sulfa Antibiotics Hives, Shortness Of Breath and Rash   Codeine Nausea And Vomiting   Macrobid [Nitrofurantoin Macrocrystal] Nausea Only     Surgical History:  She  has a past surgical history that includes Cholecystectomy; tubal reconstruction; Bladder repair; Tonsillectomy; Vaginal delivery; laparoscopy (1985/ 1990); tubalplasty (1985/ 1990 ); Bunionectomy; Lithotripsy; Colonoscopy; Upper gastrointestinal endoscopy; and Total hip arthroplasty (Right, 01/04/2022). Family History:  Her family history includes Breast cancer in her paternal grandmother; Cancer (age of onset: 40) in her sister; Celiac disease in her mother; Diabetes in her paternal grandmother; Heart disease in her father and sister; Irritable bowel syndrome in her sister; Kidney disease in an other family member; Lung cancer in her maternal grandmother; Pancreatic cancer in her maternal grandmother; Thyroid disease in her sister.  REVIEW OF SYSTEMS  : All other systems reviewed and negative except where noted in the History of Present Illness.  PHYSICAL EXAM: BP 120/72   Pulse 71   Ht 5\' 3"  (1.6 m)   Wt 105 lb 3.2 oz (47.7 kg)   LMP 01/20/2011   SpO2 98%   BMI 18.64 kg/m  General Appearance: Well nourished, in no apparent distress. Head:   Normocephalic and atraumatic. Eyes:  sclerae  anicteric,conjunctive pink  Respiratory: Respiratory effort normal, BS equal bilaterally without rales, rhonchi, wheezing. Cardio: RRR with no MRGs. Peripheral pulses intact.  Abdomen: Soft,  Non-distended ,active bowel sounds. Mild to moderate tenderness in the epigastrium and in the RUQ. Without guarding and Without rebound. No masses. Rectal: Not evaluated Musculoskeletal: Full ROM,  Normal gait. Without edema. Skin:  Dry and intact without significant lesions or rashes Neuro: Alert and  oriented x4;  No focal deficits. Psych:  Cooperative. Normal mood and affect.    Doree Albee, PA-C 9:32 AM

## 2022-08-10 ENCOUNTER — Other Ambulatory Visit: Payer: Self-pay | Admitting: Nurse Practitioner

## 2022-08-10 DIAGNOSIS — G4700 Insomnia, unspecified: Secondary | ICD-10-CM

## 2022-08-12 ENCOUNTER — Other Ambulatory Visit: Payer: Medicare Other

## 2022-08-12 ENCOUNTER — Ambulatory Visit (INDEPENDENT_AMBULATORY_CARE_PROVIDER_SITE_OTHER): Payer: Medicare Other | Admitting: Physician Assistant

## 2022-08-12 ENCOUNTER — Encounter: Payer: Self-pay | Admitting: Physician Assistant

## 2022-08-12 VITALS — BP 120/72 | HR 71 | Ht 63.0 in | Wt 105.2 lb

## 2022-08-12 DIAGNOSIS — K581 Irritable bowel syndrome with constipation: Secondary | ICD-10-CM

## 2022-08-12 DIAGNOSIS — R1011 Right upper quadrant pain: Secondary | ICD-10-CM

## 2022-08-12 DIAGNOSIS — Z86010 Personal history of colonic polyps: Secondary | ICD-10-CM | POA: Diagnosis not present

## 2022-08-12 DIAGNOSIS — R1013 Epigastric pain: Secondary | ICD-10-CM

## 2022-08-12 DIAGNOSIS — N390 Urinary tract infection, site not specified: Secondary | ICD-10-CM

## 2022-08-12 DIAGNOSIS — R1010 Upper abdominal pain, unspecified: Secondary | ICD-10-CM

## 2022-08-12 DIAGNOSIS — K219 Gastro-esophageal reflux disease without esophagitis: Secondary | ICD-10-CM

## 2022-08-12 LAB — COMPREHENSIVE METABOLIC PANEL
ALT: 16 U/L (ref 0–35)
AST: 17 U/L (ref 0–37)
Albumin: 4.5 g/dL (ref 3.5–5.2)
Alkaline Phosphatase: 56 U/L (ref 39–117)
BUN: 13 mg/dL (ref 6–23)
CO2: 31 mEq/L (ref 19–32)
Calcium: 10.2 mg/dL (ref 8.4–10.5)
Chloride: 103 mEq/L (ref 96–112)
Creatinine, Ser: 0.71 mg/dL (ref 0.40–1.20)
GFR: 89.35 mL/min (ref >60.00)
Glucose, Bld: 106 mg/dL — ABNORMAL HIGH (ref 70–99)
Potassium: 4.6 mEq/L (ref 3.5–5.1)
Sodium: 142 mEq/L (ref 135–145)
Total Bilirubin: 0.5 mg/dL (ref 0.2–1.2)
Total Protein: 7 g/dL (ref 6.0–8.3)

## 2022-08-12 LAB — CBC WITH DIFFERENTIAL/PLATELET
Basophils Absolute: 0 10*3/uL (ref 0.0–0.1)
Basophils Relative: 1 % (ref 0.0–3.0)
Eosinophils Absolute: 0.1 10*3/uL (ref 0.0–0.7)
Eosinophils Relative: 1.7 % (ref 0.0–5.0)
HCT: 43 % (ref 36.0–46.0)
Hemoglobin: 14.3 g/dL (ref 12.0–15.0)
Lymphocytes Relative: 28.2 % (ref 12.0–46.0)
Lymphs Abs: 1.4 10*3/uL (ref 0.7–4.0)
MCHC: 33.3 g/dL (ref 30.0–36.0)
MCV: 90.5 fl (ref 78.0–100.0)
Monocytes Absolute: 0.6 10*3/uL (ref 0.1–1.0)
Monocytes Relative: 12.2 % — ABNORMAL HIGH (ref 3.0–12.0)
Neutro Abs: 2.9 10*3/uL (ref 1.4–7.7)
Neutrophils Relative %: 56.9 % (ref 43.0–77.0)
Platelets: 268 10*3/uL (ref 150.0–400.0)
RBC: 4.75 Mil/uL (ref 3.87–5.11)
RDW: 13.6 % (ref 11.5–15.5)
WBC: 5.1 10*3/uL (ref 4.0–10.5)

## 2022-08-12 LAB — TSH: TSH: 0.89 u[IU]/mL (ref 0.35–5.50)

## 2022-08-12 LAB — SEDIMENTATION RATE: Sed Rate: 3 mm/hr (ref 0–30)

## 2022-08-12 MED ORDER — NA SULFATE-K SULFATE-MG SULF 17.5-3.13-1.6 GM/177ML PO SOLN: 1.0000 | Freq: Once | ORAL | 0 refills | Status: AC

## 2022-08-12 MED ORDER — PANTOPRAZOLE SODIUM 40 MG PO TBEC
40.0000 mg | DELAYED_RELEASE_TABLET | Freq: Two times a day (BID) | ORAL | 1 refills | Status: AC
Start: 2022-08-12 — End: ?

## 2022-08-12 NOTE — Progress Notes (Signed)
____________________________________________________________  Attending physician addendum:  Thank you for sending this case to me. I have reviewed the entire note and agree with the plan.   Henry [name], MD  ____________________________________________________________  

## 2022-08-12 NOTE — Patient Instructions (Signed)
Your provider has requested that you go to the basement level for lab work before leaving today. Press "B" on the elevator. The lab is located at the first door on the left as you exit the elevator.  You have been scheduled for an abdominal ultrasound at [facility] (1st floor of hospital) on 08/19/2022 at 8:30am. Please arrive 30 minutes prior to your appointment for registration. Make certain not to have anything to eat or drink 6 hours prior to your appointment. Should you need to reschedule your appointment, please contact radiology at [phone]. This test typically takes about 30 minutes to perform.  We have sent the following medications to your pharmacy for you to pick up at your convenience: Protonix Suprep   Please take your proton pump inhibitor medication, TWICE DAY  Please take this medication 30 minutes to 1 hour before meals- this makes it more effective.  Avoid spicy and acidic foods Avoid fatty foods Limit your intake of coffee, tea, alcohol, and carbonated drinks Work to maintain a healthy weight Keep the head of the bed elevated at least 3 inches with blocks or a wedge pillow if you are having any nighttime symptoms Stay upright for 2 hours after eating Avoid meals and snacks three to four hours before bedtime  Miralax is an osmotic laxative.  It only brings more water into the stool.  This is safe to take daily.  Can take up to 17 gram of miralax twice a day.  Mix with juice or coffee.  Start 1 capful at night for 3-4 days and reassess your response in 3-4 days.  You can increase and decrease the dose based on your response.  Remember, it can take up to 3-4 days to take effect OR for the effects to wear off.   I often pair this with benefiber in the morning to help assure the stool is not too loose.   Can do linzess if you are not better Linzess  *IBS-C patients may begin to experience relief from belly pain and overall abdominal symptoms (pain,  discomfort, and bloating) in about 1 week,  with symptoms typically improving over 12 weeks.  Take at least 30 minutes before the first meal of the day on an empty stomach You can have a loose stool if you eat a high-fat breakfast. Give it at least 7 days, may have more bowel movements during that time.   The diarrhea should go away and you should start having normal, complete, full bowel movements.  It may be helpful to start treatment when you can be near the comfort of your own bathroom, such as a weekend.  After you are out we can send in a prescription if you did well, there is a prescription card  _______________________________________________________  If your blood pressure at your visit was 140/90 or greater, please contact your primary care physician to follow up on this.  _______________________________________________________  If you are age 70 or older, your body mass index should be between 23-30. Your Body mass index is 18.64 kg/m. If this is out of the aforementioned range listed, please consider follow up with your Primary Care Provider.  If you are age 86 or younger, your body mass index should be between 19-25. Your Body mass index is 18.64 kg/m. If this is out of the aformentioned range listed, please consider follow up with your Primary Care Provider.   ________________________________________________________  The [place] [facility] providers would like to encourage you to use [facility] to communicate  with providers for non-urgent requests or questions.  Due to long hold times on the telephone, sending your provider a message by [facility] may be a faster and more efficient way to get a response.  Please allow 48 business hours for a response.  Please remember that this is for non-urgent requests.  _______________________________________________________ It was a pleasure to see you today!  Thank you for trusting me with your gastrointestinal care!

## 2022-08-12 NOTE — Progress Notes (Signed)
 Welcome to Medicare and Follow up  Assessment and Plan:  Encounter for welcome to medicare Due Yearly Colonoscopy 2014 due this year- scheduling with Dr. Myrtie Neither CBC and CMP done yesterday and are normal  Mixed hyperlipidemia Continue medications:rosuvastatin 10mg  Discussed dietary and exercise modifications Low fat diet       -     Lipid panel  GERD Followed by Dr. Myrtie Neither, GI Protonix increased yesterday to 40 mg BID  Irritable bowel syndrome with constipation Increase fiber, diet discussed Discussed metamucil Uses miralax prn  Anxiety state -Discussed will be starting to wean off alprazolam, if unable to tolerate she is to establish with psychiatry  stress management techniques discussed, increase water, good sleep hygiene discussed, increase exercise, and increase veggies.   Chronic interstitial cystitis Follows with Urology Continue to monitor  Abnormal glucose Discussed disease progression and risks Discussed diet/exercise, weight management and risk modification Glucose yesterday was 106  HSV-2 infection Continue medications  Allergic state, sequela Continue OTC meds  Vitamin D deficiency .Continue supplementation to maintain goal of 70-100 Taking Vitamin D 2,000 IU daily Recent increasel -     VITAMIN D 25 Hydroxy (Vit-D Deficiency, Fractures)  B12 def  Taking 1,000mg  every other day supplementation -B12  Vasmotor symptom  Estradiol 0.5mg  half tablet, progesterone 2.5mg  half tablet - increase to daily  Medication management -     Magnesium   BMI 18.38 Add on protein powder, start to work out again.   Vaginal atrophy Encouraged to take estrace and Provera daily to determine if this helps with recurrent UTI symptoms.   Begin Fem Dophilus probiotic that was recommended by GYN  Former Smoker Quit smoking 9 years ago.  Discussed med's effects and SE's. Screening labs and tests as requested with regular follow-up as recommended. Future Appointments   Date Time Provider Department Center  08/17/2022  3:05 PM [facility] ECHO 1 [id number]  08/19/2022  8:30 AM [facility]-US 2 [facility]-US [place]  09/23/2022  2:00 PM [name], Andreas Blower, MD [facility]  10/25/2022  3:15 PM Jerene Bears, MD [facility]  02/10/2023  2:15 PM Jerene Bears, MD [facility]  02/14/2023  9:00 AM Raynelle Dick, NP [facility] None  08/17/2023 11:30 AM Raynelle Dick, NP [facility] None   Plan:   During the course of the visit the patient was educated and counseled about appropriate screening and preventive services including:   Pneumococcal vaccine  Prevnar 13 Influenza vaccine Td vaccine Screening electrocardiogram Bone densitometry screening Colorectal cancer screening Diabetes screening Glaucoma screening Nutrition counseling  Advanced directives: requested   HPI  67 y.o. female  presents for a complete physical.  Frequent UTI's, she has interstitial cystitis.  Frequency remains present over urgency.  She is taking oxybutinin which is helping with frequency and spasms.  She follows with Dr Evens Q41month.  She also had kidney stones. She was recently seen for annual GYN visit and they have started  She has had severe epigastric pressure that goes over her RUQ, will happen mainly at night, non exertional, every couple of months. Has had her gallbladder removed in the past, states it feels like that. She is on omeprazole every day. EGD 2014.  She is not on tumeric over the counter. She does wine/mixed drink 3 days a week. She is following with Dr. Myrtie Neither.       Her blood pressure has been controlled at home, today their BP is BP: 102/68.   BP Readings from Last  3 Encounters:  08/13/22 102/68  08/12/22 120/72  07/20/22 132/78   She does workout. She denies chest pain, shortness of breath, dizziness.   BMI is Body mass index is 18.38 kg/m., she is working on diet and exercise. Wt Readings from Last 3 Encounters:  08/13/22 105 lb 6.4 oz  (47.8 kg)  08/12/22 105 lb 3.2 oz (47.7 kg)  07/20/22 106 lb 3.2 oz (48.2 kg)    She is on cholesterol medication. She is doing 20 mg every other day. Her cholesterol is at goal. The cholesterol last visit was:  Lab Results  Component Value Date   CHOL 176 02/12/2022   HDL 75 02/12/2022   LDLCALC 84 02/12/2022   TRIG 77 02/12/2022   CHOLHDL 2.3 02/12/2022   She has been working on diet and exercise for prediabetes, she is on bASA, she is not on ACE/ARB and denies foot ulcerations, hyperglycemia, hypoglycemia , increased appetite, nausea, paresthesia of the feet, polydipsia, polyuria, visual disturbances, vomiting and weight loss. Last A1C in the office was:  Lab Results  Component Value Date   HGBA1C 5.6 02/12/2022   Patient is on Vitamin D supplement.   Lab Results  Component Value Date   VD25OH 89 02/12/2022      She is very stressed, keeps her grand babies and another baby 3 days a week, 1 under 2 one is 2 and one is 65 years old. She mainly takes xanax 0.25 1/2-1 tab as needed at night, none during the day.   Current Medications:   Current Outpatient Medications (Endocrine & Metabolic):    estradiol (ESTRACE) 0.5 MG tablet, Take 0.5 tablets (0.25 mg total) by mouth every other day.  Current Outpatient Medications (Cardiovascular):    diltiazem (CARDIZEM) 30 MG tablet, Take 1 tab twice a day as needed for palpitations (Patient taking differently: Take 30 mg by mouth as needed. Take 1 tab twice a day as needed for palpitations)   rosuvastatin (CRESTOR) 20 MG tablet, Take  1 tablet  Daily  for Cholesterol    Current Outpatient Medications (Hematological):    Cyanocobalamin (B-12 PO), Take by mouth.  Current Outpatient Medications (Other):    ALPRAZolam (XANAX) 0.25 MG tablet, TAKE 1/2 TO 1 TABLET BY MOUTH EVERY NIGHT AT BEDTIME ONLY AS NEEDED FOR SLEEP LIMIT TO 5 DAYS PER WEEK TO AVOID ADDICTION AND DEMENTIA   ascorbic acid (VITAMIN C) 500 MG tablet, Take 250 mg by mouth  every other day.   bimatoprost (LATISSE) 0.03 % ophthalmic solution, Apply 1 drop to eye at bedtime.   CALCIUM CITRATE PO, Take 1 tablet by mouth daily.   Cholecalciferol (VITAMIN D) 50 MCG (2000 UT) tablet, Take 2,000 Units by mouth daily.   dicyclomine (BENTYL) 10 MG capsule, Take 1 capsule (10 mg total) by mouth 4 (four) times daily -  before meals and at bedtime. (Patient taking differently: Take 10 mg by mouth as needed.)   Docusate Calcium (STOOL SOFTENER PO), Take by mouth.   estradiol (ESTRACE) 0.1 MG/GM vaginal cream, Place 1 Applicatorful vaginally as needed.   hyoscyamine (LEVSIN) 0.125 MG tablet, Take 1 tablet (0.125 mg total) by mouth every 4 (four) hours as needed for cramping (diarrhea, nausea).   oxybutynin (DITROPAN-XL) 10 MG 24 hr tablet, Take 10 mg by mouth at bedtime.   pantoprazole (PROTONIX) 40 MG tablet, Take 1 tablet (40 mg total) by mouth 2 (two) times daily before a meal.   polyethylene glycol powder (GLYCOLAX/MIRALAX) 17 GM/SCOOP powder, as needed.  valACYclovir (VALTREX) 500 MG tablet, Take 1 tablet (500 mg total) by mouth daily.  Health Maintenance:   Immunization History  Administered Date(s) Administered   DTaP 01/26/2004   Influenza Inj Mdck Quad Pf 10/29/2016, 10/17/2017, 10/20/2018   Influenza,inj,Quad PF,6+ Mos 10/20/2018, 10/22/2019   Influenza-Unspecified 10/22/2015, 10/29/2016, 10/17/2017, 10/20/2018, 10/23/2020, 11/05/2020, 10/29/2021   PFIZER Comirnaty(Gray Top)Covid-19 Tri-Sucrose Vaccine 04/17/2019, 05/08/2019   Pfizer Covid-19 Vaccine Bivalent Booster 14yrs & up 01/01/2020   Tdap 01/28/2014   Zoster, Live 01/26/2007   Health Maintenance  Topic Date Due   COVID-19 Vaccine (4 - 2023-24 season) 08/29/2022 (Originally 09/25/2021)   Colonoscopy  10/14/2022 (Originally 07/13/2022)   Zoster Vaccines- Shingrix (1 of 2) 11/13/2022 (Originally 05/23/1976)   Pneumonia Vaccine 33+ Years old (1 of 1 - PCV) 08/13/2023 (Originally 05/24/2022)   HIV Screening   08/13/2023 (Originally 05/23/1972)   INFLUENZA VACCINE  08/26/2022   Medicare Annual Wellness (AWV)  08/13/2023   MAMMOGRAM  10/21/2023   DTaP/Tdap/Td (3 - Td or Tdap) 01/29/2024   PAP SMEAR-Modifier  02/08/2025   DEXA SCAN  Completed   Hepatitis C Screening  Completed   HPV VACCINES  Aged Out   EYE Doctor: Dr. Dione Booze 2023 Dentist: Dr. Lawrence Marseilles 2024 q 6 months  Patient Care Team: Raynelle Dick, NP as PCP - General (Nurse Practitioner) Hart Carwin, MD (Inactive) as Consulting Physician (Gastroenterology)  MEDICARE WELLNESS OBJECTIVES: Physical activity:  Walks 2 miles every day Cardiac risk factors: Cardiac Risk Factors include: advanced age (>49men, >19 women) Depression/mood screen:      08/13/2022   10:49 AM  Depression screen PHQ 2/9  Decreased Interest 0  Down, Depressed, Hopeless 0  PHQ - 2 Score 0    ADLs:     08/13/2022   10:48 AM 12/23/2021    1:55 PM  In your present state of health, do you have any difficulty performing the following activities:  Hearing? 0   Vision? 0   Difficulty concentrating or making decisions? 0   Walking or climbing stairs? 1   Dressing or bathing? 0   Doing errands, shopping? 0 0     Cognitive Testing  Alert? Yes  Normal Appearance?Yes  Oriented to person? Yes  Place? Yes   Time? Yes  Recall of three objects?  Yes  Can perform simple calculations? Yes  Displays appropriate judgment?Yes  Can read the correct time from a watch face?Yes  EOL planning: Does Patient Have a Medical Advance Directive?: Yes Type of Advance Directive: Living will, Healthcare Power of Attorney Does patient want to make changes to medical advance directive?: No - Patient declined Copy of Healthcare Power of Attorney in Chart?: No - copy requested     Medical History:  Past Medical History:  Diagnosis Date   Allergy    ANAL FISSURE, HX OF 09/24/2008   Qualifier: Diagnosis of  By: Katrinka Blazing CMA, Kelly     Anxiety    Arthritis    Cancer [facility])     skin cancer   Fibroids    Uterine   GERD (gastroesophageal reflux disease)    Hepatitis    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   History of kidney stones    HSV-2 (herpes simplex virus 2) infection    Hyperlipidemia    IBS (irritable bowel syndrome)    Interstitial cystitis    Nevus    vulva nevus   Prediabetes    Allergies Allergies  Allergen Reactions   Sulfa Antibiotics Hives, Shortness  Of Breath and Rash   Codeine Nausea And Vomiting   Macrobid [Nitrofurantoin Macrocrystal] Nausea Only    SURGICAL HISTORY She  has a past surgical history that includes Cholecystectomy; tubal reconstruction; Bladder repair; Tonsillectomy; Vaginal delivery; laparoscopy (1985/ 1990); tubalplasty (1985/ 1990 ); Bunionectomy; Lithotripsy; Colonoscopy; Upper gastrointestinal endoscopy; and Total hip arthroplasty (Right, 01/04/2022). FAMILY HISTORY Her family history includes Breast cancer in her paternal grandmother; Cancer (age of onset: 63) in her sister; Celiac disease in her mother; Diabetes in her paternal grandmother; Heart disease in her father and sister; Irritable bowel syndrome in her sister; Kidney disease in an other family member; Lung cancer in her maternal grandmother; Pancreatic cancer in her maternal grandmother; Thyroid disease in her sister. SOCIAL HISTORY She  reports that she quit smoking about 10 years ago. Her smoking use included cigarettes. She has never used smokeless tobacco. She reports current alcohol use of about 5.0 standard drinks of alcohol per week. She reports that she does not use drugs.  Review of Systems: Review of Systems  Constitutional:  Negative for chills and fever.  HENT:  Negative for congestion, hearing loss, sinus pain, sore throat and tinnitus.   Eyes:  Negative for blurred vision and double vision.  Respiratory:  Negative for cough, hemoptysis, sputum production, shortness of breath and wheezing.   Cardiovascular:  Negative for chest pain,  palpitations and leg swelling.  Gastrointestinal:  Positive for heartburn. Negative for abdominal pain, constipation, diarrhea, nausea and vomiting.  Genitourinary:  Negative for dysuria and urgency.  Musculoskeletal:  Negative for back pain, falls, joint pain, myalgias and neck pain.  Skin:  Negative for rash.  Neurological:  Negative for dizziness, tingling, tremors, weakness and headaches.  Endo/Heme/Allergies:  Does not bruise/bleed easily.  Psychiatric/Behavioral:  Negative for depression and suicidal ideas. The patient is nervous/anxious and has insomnia.     Physical Exam: Estimated body mass index is 18.38 kg/m as calculated from the following:   Height as of this encounter: 5' 3.5" (1.613 m).   Weight as of this encounter: 105 lb 6.4 oz (47.8 kg). BP 102/68   Pulse 72   Temp 97.7 F (36.5 C)   Ht 5' 3.5" (1.613 m)   Wt 105 lb 6.4 oz (47.8 kg)   LMP 01/20/2011   SpO2 98%   BMI 18.38 kg/m   General Appearance: Very pleasant thin female in no apparent distress.  Eyes: PERRLA, EOMs, conjunctiva no swelling or erythema ENT/Mouth: Ear canals normal without obstruction, swelling, erythema, or discharge.  TMs normal bilaterally with no erythema, bulging, retraction, or loss of landmark.  Oropharynx moist and clear with no exudate, erythema, or swelling.   Neck: Supple, thyroid normal. No bruits.  No cervical adenopathy Respiratory: Respiratory effort normal, Breath sounds clear A&P without wheeze, rhonchi, rales.   Cardio: RRR without murmurs, rubs or gallops. Brisk peripheral pulses without edema.  Chest: symmetric, with normal excursions Breasts: Deferred to GYN Abdomen: Soft, nontender, no guarding, rebound, hernias, masses, or organomegaly.  Lymphatics: Non tender without lymphadenopathy.  Musculoskeletal: Full ROM all peripheral extremities,5/5 strength, and normal gait.  Skin: Warm, dry without rashes, lesions, ecchymosis. Neuro: Awake and oriented X 3, Cranial nerves  intact, reflexes equal bilaterally. Normal muscle tone, no cerebellar symptoms. Sensation intact.  Psych:  normal affect, Insight and Judgment appropriate.    Medicare Attestation I have personally reviewed: The patient's medical and social history Their use of alcohol, tobacco or illicit drugs Their current medications and supplements The patient's functional ability including  ADLs,fall risks, home safety risks, cognitive, and hearing and visual impairment Diet and physical activities Evidence for depression or mood disorders  The patient's weight, height, BMI, and visual acuity have been recorded in the chart.  I have made referrals, counseling, and provided education to the patient based on review of the above and I have provided the patient with a written personalized care plan for preventive services.     Over 40 minutes of exam, counseling, chart review and critical decision making was performed  Aidynn Krenn E  3:54 PM [facility]

## 2022-08-13 ENCOUNTER — Encounter: Payer: Self-pay | Admitting: Nurse Practitioner

## 2022-08-13 ENCOUNTER — Ambulatory Visit (INDEPENDENT_AMBULATORY_CARE_PROVIDER_SITE_OTHER): Payer: Medicare Other | Admitting: Nurse Practitioner

## 2022-08-13 VITALS — BP 102/68 | HR 72 | Temp 97.7°F | Ht 63.5 in | Wt 105.4 lb

## 2022-08-13 DIAGNOSIS — N3010 Interstitial cystitis (chronic) without hematuria: Secondary | ICD-10-CM | POA: Diagnosis not present

## 2022-08-13 DIAGNOSIS — J309 Allergic rhinitis, unspecified: Secondary | ICD-10-CM

## 2022-08-13 DIAGNOSIS — Z87891 Personal history of nicotine dependence: Secondary | ICD-10-CM

## 2022-08-13 DIAGNOSIS — R6889 Other general symptoms and signs: Secondary | ICD-10-CM | POA: Diagnosis not present

## 2022-08-13 DIAGNOSIS — I10 Essential (primary) hypertension: Secondary | ICD-10-CM

## 2022-08-13 DIAGNOSIS — K219 Gastro-esophageal reflux disease without esophagitis: Secondary | ICD-10-CM

## 2022-08-13 DIAGNOSIS — E782 Mixed hyperlipidemia: Secondary | ICD-10-CM

## 2022-08-13 DIAGNOSIS — Z136 Encounter for screening for cardiovascular disorders: Secondary | ICD-10-CM | POA: Diagnosis not present

## 2022-08-13 DIAGNOSIS — B009 Herpesviral infection, unspecified: Secondary | ICD-10-CM

## 2022-08-13 DIAGNOSIS — Z681 Body mass index (BMI) 19 or less, adult: Secondary | ICD-10-CM

## 2022-08-13 DIAGNOSIS — Z0001 Encounter for general adult medical examination with abnormal findings: Secondary | ICD-10-CM

## 2022-08-13 DIAGNOSIS — R7309 Other abnormal glucose: Secondary | ICD-10-CM

## 2022-08-13 DIAGNOSIS — E559 Vitamin D deficiency, unspecified: Secondary | ICD-10-CM

## 2022-08-13 DIAGNOSIS — K581 Irritable bowel syndrome with constipation: Secondary | ICD-10-CM

## 2022-08-13 DIAGNOSIS — Z79899 Other long term (current) drug therapy: Secondary | ICD-10-CM

## 2022-08-13 DIAGNOSIS — E538 Deficiency of other specified B group vitamins: Secondary | ICD-10-CM

## 2022-08-13 DIAGNOSIS — F411 Generalized anxiety disorder: Secondary | ICD-10-CM

## 2022-08-13 DIAGNOSIS — N952 Postmenopausal atrophic vaginitis: Secondary | ICD-10-CM

## 2022-08-13 DIAGNOSIS — Z0000 Encounter for general adult medical examination without abnormal findings: Secondary | ICD-10-CM

## 2022-08-13 DIAGNOSIS — R7989 Other specified abnormal findings of blood chemistry: Secondary | ICD-10-CM

## 2022-08-13 NOTE — Patient Instructions (Signed)

## 2022-08-14 ENCOUNTER — Encounter: Payer: Self-pay | Admitting: Nurse Practitioner

## 2022-08-14 LAB — LIPID PANEL
Cholesterol: 195 mg/dL (ref <200)
HDL: 66 mg/dL (ref >=50)
LDL Cholesterol (Calc): 113 mg/dL (calc) — ABNORMAL HIGH
Non-HDL Cholesterol (Calc): 129 mg/dL (calc) (ref <130)
Total CHOL/HDL Ratio: 3 (calc) (ref <5.0)
Triglycerides: 75 mg/dL (ref <150)

## 2022-08-14 LAB — VITAMIN B12: Vitamin B-12: 898 pg/mL (ref 200–1100)

## 2022-08-14 LAB — VITAMIN D 25 HYDROXY (VIT D DEFICIENCY, FRACTURES): Vit D, 25-Hydroxy: 64 ng/mL (ref 30–100)

## 2022-08-14 LAB — MAGNESIUM: Magnesium: 2.3 mg/dL (ref 1.5–2.5)

## 2022-08-17 ENCOUNTER — Ambulatory Visit (HOSPITAL_COMMUNITY): Payer: Medicare Other | Attending: Cardiology

## 2022-08-17 DIAGNOSIS — I5030 Unspecified diastolic (congestive) heart failure: Secondary | ICD-10-CM | POA: Diagnosis not present

## 2022-08-17 DIAGNOSIS — E785 Hyperlipidemia, unspecified: Secondary | ICD-10-CM | POA: Insufficient documentation

## 2022-08-17 DIAGNOSIS — R002 Palpitations: Secondary | ICD-10-CM | POA: Diagnosis not present

## 2022-08-17 DIAGNOSIS — R0602 Shortness of breath: Secondary | ICD-10-CM | POA: Insufficient documentation

## 2022-08-17 LAB — ECHOCARDIOGRAM COMPLETE
Area-P 1/2: 4.35 cm2
S' Lateral: 2.7 cm

## 2022-08-18 ENCOUNTER — Encounter (HOSPITAL_BASED_OUTPATIENT_CLINIC_OR_DEPARTMENT_OTHER): Payer: Self-pay | Admitting: Obstetrics & Gynecology

## 2022-08-19 ENCOUNTER — Ambulatory Visit (INDEPENDENT_AMBULATORY_CARE_PROVIDER_SITE_OTHER): Payer: Medicare Other | Admitting: *Deleted

## 2022-08-19 ENCOUNTER — Ambulatory Visit (HOSPITAL_COMMUNITY)
Admission: RE | Admit: 2022-08-19 | Discharge: 2022-08-19 | Disposition: A | Discharge status: Home / Self Care | Payer: Medicare Other | Source: Ambulatory Visit | Attending: Physician Assistant | Admitting: Physician Assistant

## 2022-08-19 VITALS — BP 120/59 | HR 69 | Ht 63.0 in | Wt 107.6 lb

## 2022-08-19 DIAGNOSIS — K581 Irritable bowel syndrome with constipation: Secondary | ICD-10-CM | POA: Insufficient documentation

## 2022-08-19 DIAGNOSIS — R1011 Right upper quadrant pain: Secondary | ICD-10-CM | POA: Insufficient documentation

## 2022-08-19 DIAGNOSIS — R1013 Epigastric pain: Secondary | ICD-10-CM | POA: Diagnosis present

## 2022-08-19 DIAGNOSIS — R300 Dysuria: Secondary | ICD-10-CM | POA: Diagnosis not present

## 2022-08-19 LAB — POCT URINALYSIS DIPSTICK
Appearance: NORMAL
Bilirubin, UA: NEGATIVE
Glucose, UA: NEGATIVE
Ketones, UA: NEGATIVE
Nitrite, UA: POSITIVE
Odor: ABNORMAL
Protein, UA: POSITIVE — AB
Spec Grav, UA: 1.02 (ref 1.010–1.025)
Urobilinogen, UA: 0.2 E.U./dL
pH, UA: 5.5 (ref 5.0–8.0)

## 2022-08-19 MED ORDER — DOXYCYCLINE HYCLATE 100 MG PO CAPS: 100.0000 mg | ORAL_CAPSULE | Freq: Two times a day (BID) | ORAL | 0 refills | Status: DC

## 2022-08-19 NOTE — Progress Notes (Signed)
Pt presents to office with complaints of burning with urination. Was recently treated for UTI and symptoms have returned. Clean catch urine obtained and sent for culture. Urine dip positive for nitrates. Rx sent to pharmacy. Advised we would contact pt if abx need to be changed after culture has resulted. Pt verbalized understanding.

## 2022-08-20 ENCOUNTER — Other Ambulatory Visit (HOSPITAL_BASED_OUTPATIENT_CLINIC_OR_DEPARTMENT_OTHER): Payer: Self-pay | Admitting: Obstetrics & Gynecology

## 2022-08-20 NOTE — Telephone Encounter (Signed)
Called pt.  Reviewed urine culture done in another office showing enterococcus.  She has allergies that limit choices of antibiotics and she already took Augmentin for treatment.  Advised I can change abs to PCN or amoxicillin but feel this is less likely to work.  Symptoms are better.  She does have zofran to help.  Is going to stay on this until urine culture is finalized and can offer more recommendations.

## 2022-08-24 ENCOUNTER — Telehealth: Payer: Self-pay | Admitting: Obstetrics and Gynecology

## 2022-08-24 NOTE — Telephone Encounter (Signed)
Called and confirmed ID [id number].   Has on more day of doxycycline left and no longer having as much as urinary urgency though notes a history of interstitial cystitis. Pain has improved. Has 1 day left of antibiotic. Has had abdominal discomfort with doxycycline. Will complete course and follow up with [facility]. Noted if symptoms worsen/return, to reach out.   Lorriane Shire, MD, FACOG Minimally Invasive Gynecologic Surgery  Obstetrics and Gynecology, [facility] for [facility], [facility] 08/24/2022

## 2022-09-08 ENCOUNTER — Encounter: Payer: Self-pay | Admitting: Nurse Practitioner

## 2022-09-13 ENCOUNTER — Other Ambulatory Visit: Payer: Self-pay | Admitting: Nurse Practitioner

## 2022-09-13 DIAGNOSIS — G4700 Insomnia, unspecified: Secondary | ICD-10-CM

## 2022-09-14 ENCOUNTER — Encounter: Payer: Self-pay | Admitting: Gastroenterology

## 2022-09-21 LAB — HM MAMMOGRAPHY

## 2022-09-23 ENCOUNTER — Ambulatory Visit (AMBULATORY_SURGERY_CENTER): Payer: Medicare Other | Admitting: Gastroenterology

## 2022-09-23 ENCOUNTER — Encounter: Payer: Self-pay | Admitting: Gastroenterology

## 2022-09-23 VITALS — BP 110/82 | HR 65 | Temp 98.4°F | Resp 10 | Ht 63.0 in | Wt 105.0 lb

## 2022-09-23 DIAGNOSIS — Z1211 Encounter for screening for malignant neoplasm of colon: Secondary | ICD-10-CM | POA: Diagnosis not present

## 2022-09-23 DIAGNOSIS — R1013 Epigastric pain: Secondary | ICD-10-CM | POA: Diagnosis not present

## 2022-09-23 DIAGNOSIS — R140 Abdominal distension (gaseous): Secondary | ICD-10-CM

## 2022-09-23 DIAGNOSIS — D122 Benign neoplasm of ascending colon: Secondary | ICD-10-CM

## 2022-09-23 DIAGNOSIS — R1011 Right upper quadrant pain: Secondary | ICD-10-CM

## 2022-09-23 DIAGNOSIS — K2950 Unspecified chronic gastritis without bleeding: Secondary | ICD-10-CM

## 2022-09-23 MED ORDER — SODIUM CHLORIDE 0.9 % IV SOLN: 500.0000 mL | Freq: Once | INTRAVENOUS | Status: DC

## 2022-09-23 NOTE — Op Note (Signed)
[facility] Patient Name: Patricia Ryan Procedure Date: 09/23/2022 1:52 PM MRN: [id number] Endoscopist: Sherilyn Cooter L. Myrtie Neither , MD, [id number] Age: 67 Referring MD:  Date of Birth: August 17, 1955 Gender: Female Account #: [id number] Procedure:                Colonoscopy Indications:              Screening for colorectal malignant neoplasm                           No adenomatous or serrated polyps on last                            colonoscopy in 2014 Medicines:                Monitored Anesthesia Care Procedure:                Pre-Anesthesia Assessment:                           - Prior to the procedure, a History and Physical                            was performed, and patient medications and                            allergies were reviewed. The patient's tolerance of                            previous anesthesia was also reviewed. The risks                            and benefits of the procedure and the sedation                            options and risks were discussed with the patient.                            All questions were answered, and informed consent                            was obtained. Prior Anticoagulants: The patient has                            taken no anticoagulant or antiplatelet agents. ASA                            Grade Assessment: II - A patient with mild systemic                            disease. After reviewing the risks and benefits,                            the patient was deemed in satisfactory condition to  undergo the procedure.                           After obtaining informed consent, the colonoscope                            was passed under direct vision. Throughout the                            procedure, the patient's blood pressure, pulse, and                            oxygen saturations were monitored continuously. The                            Olympus PCF-H190DL (#[id number]) Colonoscope was                             introduced through the anus and advanced to the the                            terminal ileum, with identification of the                            appendiceal orifice and IC valve. The colonoscopy                            was performed without difficulty. The patient                            tolerated the procedure well. The quality of the                            bowel preparation was good. The ileocecal valve,                            appendiceal orifice, and rectum were photographed. Scope In: 2:16:07 PM Scope Out: 2:27:58 PM Scope Withdrawal Time: 0 hours 8 minutes 19 seconds  Total Procedure Duration: 0 hours 11 minutes 51 seconds  Findings:                 The perianal and digital rectal examinations were                            normal.                           Repeat examination of right colon under NBI                            performed.                           A diminutive polyp was found in the ascending  colon. The polyp was sessile. The polyp was removed                            with a cold snare. Resection and retrieval were                            complete.                           The exam was otherwise without abnormality on                            direct and retroflexion views. Complications:            No immediate complications. Estimated Blood Loss:     Estimated blood loss was minimal. Impression:               - One diminutive polyp in the ascending colon,                            removed with a cold snare. Resected and retrieved.                           - The examination was otherwise normal on direct                            and retroflexion views. Recommendation:           - Patient has a contact number available for                            emergencies. The signs and symptoms of potential                            delayed complications were discussed with the                             patient. Return to normal activities tomorrow.                            Written discharge instructions were provided to the                            patient.                           - Resume previous diet.                           - Continue present medications.                           - Await pathology results.                           - Repeat colonoscopy is recommended for  surveillance. The colonoscopy date will be                            determined after pathology results from today's                            exam become available for review.                           - See the other procedure note for documentation of                            additional recommendations. Ariyon Mittleman L. Myrtie Neither, MD 09/23/2022 2:37:07 PM This report has been signed electronically.

## 2022-09-23 NOTE — Patient Instructions (Addendum)
1 polyp removed and gastric biopsies                            - Resume previous diet.                           - Continue present medications.                           - Await pathology results.                            - Repeat colonoscopy is recommended for                            surveillance. The colonoscopy date will be                            determined after pathology results from today's                            exam become available for review.                           - See the other procedure note for documentation of                            additional recommendations.  YOU HAD AN ENDOSCOPIC PROCEDURE TODAY AT THE [facility]:   Refer to the procedure report that was given to you for any specific questions about what was found during the examination.  If the procedure report does not answer your questions, please call your gastroenterologist to clarify.  If you requested that your care partner not be given the details of your procedure findings, then the procedure report has been included in a sealed envelope for you to review at your convenience later.  YOU SHOULD EXPECT: Some feelings of bloating in the abdomen. Passage of more gas than usual.  Walking can help get rid of the air that was put into your GI tract during the procedure and reduce the bloating. If you had a lower endoscopy (such as a colonoscopy or flexible sigmoidoscopy) you may notice spotting of blood in your stool or on the toilet paper. If you underwent a bowel prep for your procedure, you may not have a normal bowel movement for a few days.  Please Note:  You might notice some irritation and congestion in your nose or some drainage.  This is from the oxygen used during your procedure.  There is no need for concern and it should clear up in a day or so.  SYMPTOMS TO REPORT IMMEDIATELY:  Following lower endoscopy (colonoscopy or flexible  sigmoidoscopy):  Excessive amounts of blood in the stool  Significant tenderness or worsening of abdominal pains  Swelling of the abdomen that is new, acute  Fever of 100F or higher  Following upper endoscopy (EGD)  Vomiting of blood or coffee ground material  New chest pain or pain under the shoulder blades  Painful or persistently difficult swallowing  New shortness of breath  Fever of 100F or higher  Black, tarry-looking stools  For urgent or emergent issues, a gastroenterologist can be reached at any hour by calling [phone]. Do not use MyChart messaging for urgent concerns.    DIET:  We do recommend a small meal at first, but then you may proceed to your regular diet.  Drink plenty of fluids but you should avoid alcoholic beverages for 24 hours.  ACTIVITY:  You should plan to take it easy for the rest of today and you should NOT DRIVE or use heavy machinery until tomorrow (because of the sedation medicines used during the test).    FOLLOW UP: Our staff will call the number listed on your records the next business day following your procedure.  We will call around 7:15- 8:00 am to check on you and address any questions or concerns that you may have regarding the information given to you following your procedure. If we do not reach you, we will leave a message.     If any biopsies were taken you will be contacted by phone or by letter within the next 1-3 weeks.  Please call us at [phone] if you have not heard about the biopsies in 3 weeks.    SIGNATURES/CONFIDENTIALITY: You and/or your care partner have signed paperwork which will be entered into your electronic medical record.  These signatures attest to the fact that that the information above on your After Visit Summary has been reviewed and is understood.  Full responsibility of the confidentiality of this discharge information lies with you and/or your care-partner.

## 2022-09-23 NOTE — Op Note (Signed)
[facility] Patient Name: Patricia Ryan Procedure Date: 09/23/2022 1:56 PM MRN: [id number] Endoscopist: Sherilyn Cooter L. Myrtie Neither , MD, [id number] Age: 67 Referring MD:  Date of Birth: 02-09-1955 Gender: Female Account #: [id number] Procedure:                Upper GI endoscopy Indications:              Epigastric abdominal pain, Abdominal bloating Medicines:                Monitored Anesthesia Care Procedure:                Pre-Anesthesia Assessment:                           - Prior to the procedure, a History and Physical                            was performed, and patient medications and                            allergies were reviewed. The patient's tolerance of                            previous anesthesia was also reviewed. The risks                            and benefits of the procedure and the sedation                            options and risks were discussed with the patient.                            All questions were answered, and informed consent                            was obtained. Prior Anticoagulants: The patient has                            taken no anticoagulant or antiplatelet agents. ASA                            Grade Assessment: II - A patient with mild systemic                            disease. After reviewing the risks and benefits,                            the patient was deemed in satisfactory condition to                            undergo the procedure.                           After obtaining informed consent, the endoscope was  passed under direct vision. Throughout the                            procedure, the patient's blood pressure, pulse, and                            oxygen saturations were monitored continuously. The                            GIF [id number] #[id number] was introduced through the                            mouth, and advanced to the second part of duodenum.                            The  upper GI endoscopy was accomplished without                            difficulty. The patient tolerated the procedure                            well. Scope In: Scope Out: Findings:                 The esophagus was normal.                           Striped erythematous mucosa without bleeding was                            found in the entire examined stomach. Biopsies were                            taken with a cold forceps for histology to rule out                            H. pylori. Bilious fluid, bile staining of the                            gastric mucosa.                           The exam of the stomach was otherwise normal. The                            stomach distended well with air.                           The cardia and gastric fundus were normal on                            retroflexion.                           The examined duodenum was normal. Complications:  No immediate complications. Estimated Blood Loss:     Estimated blood loss was minimal. Impression:               - Normal esophagus.                           - Erythematous mucosa in the stomach. Biopsied.                           - Normal examined duodenum.                           The reported symptoms do not sound biliary.                            Biopsies pending for H. pylori, though the                            nonspecific gas mucosal findings may be related to                            bile staining and of no clinical significance.                            Patient was negative for H. pylori and duodenal                            changes of celiac sprue in 2014.                           Seems most likely functional/motility, perhaps                            related to patient's underlying IBS-C. Frequent                            antibiotics for UTI, so consider SIBO. Recommendation:           - Patient has a contact number available for                             emergencies. The signs and symptoms of potential                            delayed complications were discussed with the                            patient. Return to normal activities tomorrow.                            Written discharge instructions were provided to the                            patient.                           -  Resume previous diet.                           - Continue present medications.                           - Await pathology results.                           - See the other procedure note for documentation of                            additional recommendations. Jehan Ranganathan L. Myrtie Neither, MD 09/23/2022 2:34:47 PM This report has been signed electronically.

## 2022-09-23 NOTE — Progress Notes (Signed)
History and Physical:  This patient presents for endoscopic testing for: Encounter Diagnoses  Name Primary?   Special screening for malignant neoplasms, colon Yes   Dyspepsia    RUQ pain    Abdominal bloating     Several UGI symptoms outlined in 08/12/22 office consult note. Chronic constipation.  Hyperplastic colon polyp in 2014, due for CRC screening. Duodenal Bx neg for celiac in 2014, gastric Bx neg for H pylori in 2014 Patient is otherwise without complaints or active issues today.   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   History of kidney stones    HSV-2 (herpes simplex virus 2) infection    Hyperlipidemia    IBS (irritable bowel syndrome)    Interstitial cystitis    Nevus    vulva nevus   Prediabetes      Past Surgical History: Past Surgical History:  Procedure Laterality Date   BLADDER REPAIR     BUNIONECTOMY     CHOLECYSTECTOMY     COLONOSCOPY     LAPAROSCOPY  1985/ 1990   due to infertility   LITHOTRIPSY     TONSILLECTOMY     TOTAL HIP ARTHROPLASTY Right 01/04/2022   Procedure: TOTAL HIP ARTHROPLASTY;  Surgeon: Joen Laura, MD;  Location: [facility];  Service: Orthopedics;  Laterality: Right;   tubal reconstruction     tubalplasty  1985/ 1990    UPPER GASTROINTESTINAL ENDOSCOPY     VAGINAL DELIVERY     x2    Allergies: Allergies  Allergen Reactions   Sulfa Antibiotics Hives, Shortness Of Breath and Rash   Codeine Nausea And Vomiting   Macrobid [Nitrofurantoin Macrocrystal] Nausea Only    Outpatient Meds: Current Outpatient Medications  Medication Sig Dispense Refill   ALPRAZolam (XANAX) 0.25 MG tablet TAKE 1/2 TO 1 TABLET BY MOUTH EVERY  NIGHT AT BEDTIME ONLY AS NEEDED FOR SLEEP. LIMIT TO 5 DAYS PER WEEK TO AVOID ADDICTION AND DEMENTIA. 20 tablet 0   ascorbic acid (VITAMIN C) 500 MG tablet Take 250 mg by mouth every other day.     bimatoprost (LATISSE) 0.03 % ophthalmic solution Apply 1 drop to eye at bedtime.     CALCIUM CITRATE PO Take 1 tablet by mouth daily.     Cholecalciferol (VITAMIN D) 50 MCG (2000 UT) tablet Take 2,000 Units by mouth daily.     Cyanocobalamin (B-12 PO) Take by mouth.     estradiol (ESTRACE) 0.1 MG/GM vaginal cream Place 1 Applicatorful vaginally as needed.     estradiol (ESTRACE) 0.5 MG tablet Take 0.5 tablets (0.25 mg total) by mouth every other day. 45 tablet 3   oxybutynin (DITROPAN-XL) 10 MG 24 hr tablet Take 10 mg by mouth at bedtime.     pantoprazole (PROTONIX) 40 MG tablet Take 1 tablet (40 mg total) by mouth 2 (two) times daily before a meal. 60 tablet 1   polyethylene glycol powder (GLYCOLAX/MIRALAX) 17 GM/SCOOP powder as needed.     rosuvastatin (CRESTOR) 20 MG tablet Take  1 tablet  Daily  for Cholesterol 90 tablet 3   valACYclovir (VALTREX) 500  MG tablet Take 1 tablet (500 mg total) by mouth daily. 90 tablet 3   dicyclomine (BENTYL) 10 MG capsule Take 1 capsule (10 mg total) by mouth 4 (four) times daily -  before meals and at bedtime. (Patient taking differently: Take 10 mg by mouth as needed.) 90 capsule 1   diltiazem (CARDIZEM) 30 MG tablet Take 1 tab twice a day as needed for palpitations (Patient taking differently: Take 30 mg by mouth as needed. Take 1 tab twice a day as needed for palpitations) 30 tablet 1   hyoscyamine (LEVSIN) 0.125 MG tablet Take 1 tablet (0.125 mg total) by mouth every 4 (four) hours as needed for cramping (diarrhea, nausea). 50 tablet 2   Current Facility-Administered Medications  Medication Dose Route Frequency Provider Last Rate Last Admin   0.9 %  sodium chloride infusion  500 mL Intravenous Once Sherrilyn Rist, MD           ___________________________________________________________________ Objective   Exam:  BP 135/76   Pulse 75   Temp 98.4 F (36.9 C)   Resp 11   Ht 5\' 3"  (1.6 m)   Wt 105 lb (47.6 kg)   LMP 01/20/2011   SpO2 100%   BMI 18.60 kg/m   CV: regular , S1/S2 Resp: clear to auscultation bilaterally, normal RR and effort noted GI: soft, no tenderness, with active bowel sounds.   Assessment: Encounter Diagnoses  Name Primary?   Special screening for malignant neoplasms, colon Yes   Dyspepsia    RUQ pain    Abdominal bloating      Plan: Colonoscopy EGD  The benefits and risks of the planned procedure were described in detail with the patient or (when appropriate) their health care proxy.  Risks were outlined as including, but not limited to, bleeding, infection, perforation, adverse medication reaction leading to cardiac or pulmonary decompensation, pancreatitis (if ERCP).  The limitation of incomplete mucosal visualization was also discussed.  No guarantees or warranties were given.  The patient is appropriate for an endoscopic procedure in the ambulatory setting.   - Amada Jupiter, MD

## 2022-09-23 NOTE — Progress Notes (Signed)
Uneventful anesthetic. Report to pacu rn. Vss. Care resumed by rn. 

## 2022-09-24 ENCOUNTER — Telehealth: Payer: Self-pay

## 2022-09-24 NOTE — Telephone Encounter (Signed)
  Follow up Call-     09/23/2022    1:15 PM  Call back number  Post procedure Call Back phone  # [phone]  Permission to leave phone message Yes     Patient questions:  Do you have a fever, pain , or abdominal swelling? No. Pain Score  0 *  Have you tolerated food without any problems? Yes.    Have you been able to return to your normal activities? Yes.    Do you have any questions about your discharge instructions: Diet   No. Medications  No. Follow up visit  No.  Do you have questions or concerns about your Care? No.  Actions: * If pain score is 4 or above: No action needed, pain <4.

## 2022-10-05 ENCOUNTER — Encounter: Payer: Self-pay | Admitting: Gastroenterology

## 2022-10-07 ENCOUNTER — Encounter: Payer: Self-pay | Admitting: Gastroenterology

## 2022-10-14 ENCOUNTER — Other Ambulatory Visit: Payer: Self-pay | Admitting: Nurse Practitioner

## 2022-10-14 DIAGNOSIS — G4700 Insomnia, unspecified: Secondary | ICD-10-CM

## 2022-10-21 ENCOUNTER — Encounter (HOSPITAL_BASED_OUTPATIENT_CLINIC_OR_DEPARTMENT_OTHER): Payer: Self-pay | Admitting: Obstetrics & Gynecology

## 2022-10-21 ENCOUNTER — Other Ambulatory Visit (HOSPITAL_COMMUNITY)
Admission: RE | Admit: 2022-10-21 | Discharge: 2022-10-21 | Disposition: A | Discharge status: Home / Self Care | Payer: Medicare Other | Source: Ambulatory Visit | Attending: Obstetrics & Gynecology | Admitting: Obstetrics & Gynecology

## 2022-10-21 ENCOUNTER — Ambulatory Visit (HOSPITAL_BASED_OUTPATIENT_CLINIC_OR_DEPARTMENT_OTHER): Payer: Medicare Other | Admitting: Obstetrics & Gynecology

## 2022-10-21 VITALS — BP 153/68 | HR 58 | Ht 63.0 in | Wt 110.6 lb

## 2022-10-21 DIAGNOSIS — Z01411 Encounter for gynecological examination (general) (routine) with abnormal findings: Secondary | ICD-10-CM | POA: Diagnosis present

## 2022-10-21 DIAGNOSIS — N871 Moderate cervical dysplasia: Secondary | ICD-10-CM

## 2022-10-21 DIAGNOSIS — Z1151 Encounter for screening for human papillomavirus (HPV): Secondary | ICD-10-CM | POA: Diagnosis not present

## 2022-10-21 DIAGNOSIS — N390 Urinary tract infection, site not specified: Secondary | ICD-10-CM

## 2022-10-21 MED ORDER — ESTRADIOL 0.1 MG/GM VA CREA
TOPICAL_CREAM | VAGINAL | 2 refills | Status: DC
Start: 2022-10-21 — End: 2023-04-21

## 2022-10-21 MED ORDER — FOSFOMYCIN TROMETHAMINE 3 G PO PACK: 3.0000 g | PACK | Freq: Once | ORAL | 0 refills | Status: AC

## 2022-10-21 NOTE — Progress Notes (Signed)
GYNECOLOGY  VISIT  CC:   follow up pap smear  HPI: 67 y.o. G2P2 Married White or Caucasian female here for follow up pap smear.  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  She did have a positive margin.  6 month follow up was recommended due to CIN 2.  She is here for this today.  HR HPV testing will be obtained as well.  Pt is having recurrent UTIs.  She's has four UTIs since July and one of these took weeks to go away.  She has recently had a colonoscopy and is having symptoms.  I prescribed vaginal estrogen for her.  She isn't using right now as had a leak in her home and she is now having to undergo extensive renovations.  Daily use, small amount, discussed.  She does have a urologist and does have follow up.  Doesn't really want to be on daily antibiotics if necessary.     Past Medical History:  Diagnosis Date   Allergy    ANAL FISSURE, HX OF 09/24/2008   Qualifier: Diagnosis of  By: Katrinka Blazing CMA, Tresa Endo     Anxiety    Arthritis    Cancer [facility])    skin cancer   Fibroids    Uterine   GERD (gastroesophageal reflux disease)    Hepatitis    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   History of kidney stones    HSV-2 (herpes simplex virus 2) infection    Hyperlipidemia    IBS (irritable bowel syndrome)    Interstitial cystitis    Nevus    vulva nevus   Prediabetes     MEDS:   Current Outpatient Medications on File Prior to Visit  Medication Sig Dispense Refill   ALPRAZolam (XANAX) 0.25 MG tablet TAKE 1/2 TO 1 TABLET BY MOUTH EVERY NIGHT AT BEDTIME ONLY AS NEEDED FOR SLEEP. LIMIT TO 5 DAYS PER WEEK TO AVOID ADDICTION AND DEMENTIA 20 tablet 0   ascorbic acid (VITAMIN C) 500 MG tablet Take 250 mg by mouth every other day.     bimatoprost (LATISSE) 0.03 % ophthalmic solution Apply 1 drop to eye at bedtime.     CALCIUM CITRATE PO Take 1 tablet by mouth daily.     Cholecalciferol (VITAMIN D) 50 MCG (2000 UT) tablet Take 2,000 Units by mouth daily.     Cyanocobalamin (B-12 PO) Take  by mouth.     dicyclomine (BENTYL) 10 MG capsule Take 1 capsule (10 mg total) by mouth 4 (four) times daily -  before meals and at bedtime. (Patient taking differently: Take 10 mg by mouth as needed.) 90 capsule 1   diltiazem (CARDIZEM) 30 MG tablet Take 1 tab twice a day as needed for palpitations (Patient taking differently: Take 30 mg by mouth as needed. Take 1 tab twice a day as needed for palpitations) 30 tablet 1   estradiol (ESTRACE) 0.1 MG/GM vaginal cream Place 1 Applicatorful vaginally as needed.     estradiol (ESTRACE) 0.5 MG tablet Take 0.5 tablets (0.25 mg total) by mouth every other day. 45 tablet 3   hyoscyamine (LEVSIN) 0.125 MG tablet Take 1 tablet (0.125 mg total) by mouth every 4 (four) hours as needed for cramping (diarrhea, nausea). 50 tablet 2   oxybutynin (DITROPAN-XL) 10 MG 24 hr tablet Take 10 mg by mouth at bedtime.     pantoprazole (PROTONIX) 40 MG tablet Take 1 tablet (40 mg total) by mouth 2 (two) times daily before a meal.  60 tablet 1   polyethylene glycol powder (GLYCOLAX/MIRALAX) 17 GM/SCOOP powder as needed.     rosuvastatin (CRESTOR) 20 MG tablet Take  1 tablet  Daily  for Cholesterol 90 tablet 3   valACYclovir (VALTREX) 500 MG tablet Take 1 tablet (500 mg total) by mouth daily. 90 tablet 3   No current facility-administered medications on file prior to visit.    ALLERGIES: Sulfa antibiotics, Codeine, and Macrobid [nitrofurantoin macrocrystal]  SH:  married, non smoker  Review of Systems  Constitutional: Negative.   Genitourinary:  Positive for urgency.    PHYSICAL EXAMINATION:    BP (!) 153/68 (BP Location: Left Arm, Patient Position: Sitting, Cuff Size: Normal)   Pulse (!) 58   Ht 5\' 3"  (1.6 m)   Wt 110 lb 9.6 oz (50.2 kg)   LMP 01/20/2011   BMI 19.59 kg/m     General appearance: alert, cooperative and appears stated age Lymph:  no inguinal LAD noted  Pelvic: External genitalia:  no lesions              Urethra:  normal appearing urethra with  no masses, tenderness or lesions              Bartholins and Skenes: normal                 Vagina: normal appearing vagina with normal color and discharge, no lesions              Cervix: no lesions          Chaperone, Hendricks Milo, CMA, was present for exam.  Assessment/Plan: 1. Recurrent UTI - estradiol (ESTRACE) 0.1 MG/GM vaginal cream; Use small amount externally nightly  Dispense: 42.5 g; Refill: 2 - fosfomycin (MONUROL) 3 g PACK; Take 3 g by mouth once for 1 dose.  Dispense: 3 g; Refill: 0 - Urine Culture   2. Dysplasia of cervix, high grade CIN 2 - Cytology - PAP( [place])

## 2022-10-22 LAB — URINE CULTURE

## 2022-10-23 ENCOUNTER — Other Ambulatory Visit: Payer: Self-pay | Admitting: Physician Assistant

## 2022-10-23 DIAGNOSIS — K219 Gastro-esophageal reflux disease without esophagitis: Secondary | ICD-10-CM

## 2022-10-23 DIAGNOSIS — R1010 Upper abdominal pain, unspecified: Secondary | ICD-10-CM

## 2022-10-25 ENCOUNTER — Ambulatory Visit (HOSPITAL_BASED_OUTPATIENT_CLINIC_OR_DEPARTMENT_OTHER): Payer: 59 | Admitting: Obstetrics & Gynecology

## 2022-10-27 ENCOUNTER — Encounter: Payer: Self-pay | Admitting: Internal Medicine

## 2022-11-01 ENCOUNTER — Encounter (HOSPITAL_BASED_OUTPATIENT_CLINIC_OR_DEPARTMENT_OTHER): Payer: Self-pay | Admitting: Obstetrics & Gynecology

## 2022-11-01 LAB — CYTOLOGY - PAP
Comment: NEGATIVE
Diagnosis: NEGATIVE
High risk HPV: NEGATIVE

## 2022-11-11 ENCOUNTER — Encounter (HOSPITAL_BASED_OUTPATIENT_CLINIC_OR_DEPARTMENT_OTHER): Payer: Self-pay | Admitting: Obstetrics & Gynecology

## 2022-11-17 ENCOUNTER — Other Ambulatory Visit: Payer: Self-pay | Admitting: Nurse Practitioner

## 2022-11-17 DIAGNOSIS — G4700 Insomnia, unspecified: Secondary | ICD-10-CM

## 2022-12-14 NOTE — Progress Notes (Signed)
12/14/2022 Patricia Ryan [id number] 1955/08/16  Referring provider: Raynelle Dick, NP Primary GI doctor: Patricia Ryan  ASSESSMENT AND PLAN:   History of colonic polyps Colonoscopy diminutive adenomatous polyp ascending colon otherwise unremarkable, recall 7 years.  Irritable bowel syndrome with constipation - Increase fiber/ water intake, decrease caffeine, increase activity level. -Will add on Miralax daily and Benefiber - given fodmap - will test for SIBO with history of recurrent UTI/ABX exposure. - consider linzess  RUQ abdominal pain,  GERD, intermittent dysphagia, history of elevated LFTs 09/14/2019 EGD unremarkable biopsies negative H. Pylori 06/12/2021 right upper quadrant abdominal ultrasound hepatic steatosis mild biliary ductal dilation likely from previous cholecystectomy, 09/2019 MRCP without CBD EGD negative H pylori Symptoms improved with PPI twice daily, can do trial of once daily with pepcid at night and diet changes.   Recurrent UTI Patient has recurrent UTIs, gets antibiotics intermittently, has abdominal bloating, increased gas, constipation Will test for SIBO     Patient Care Team: Patricia Dick, NP as PCP - General (Nurse Practitioner) Hart Carwin, MD (Inactive) as Consulting Physician (Gastroenterology)  HISTORY OF PRESENT ILLNESS: 67 y.o. female with a past medical history of IBS, ICS, constipation, B12 deficiency and others listed below presents for evaluation of dyspepsia.   07/12/2012 colonoscopy sessile polyp 3 to 5 mm rectum otherwise unremarkable, good movie prep recall 10 years 09/14/2019 EGD normal biopsies negative for H. Pylori 09/2019 MRCP no CBD stone 08/11/2020 office visit with Patricia Ryan for right lower quadrant pain 06/12/2021 abdominal ultrasound for elevated LFTs, ALT 42, AST 17, alk phos 68, total bilirubin 0.3,  Showed increased echogenicity of liver may present hepatic steatosis, mild biliary ductal dilation which may be  compensatory from cholecystectomy correlate and consider MRCP LFTs have been unremarkable since that time.    09/23/2022 EGD and colonoscopy with Patricia Ryan via epigastric pain, bloating, screening colonoscopy EGD showed normal esophagus, erythematous mucosa in the stomach, duodenum, previous negative celiac sprue 2014 Negative H. pylori Colonoscopy diminutive adenomatous polyp ascending colon otherwise unremarkable, recall 7 years. Consider SIBO empiric treatment or testing.  GERD symptoms improved on PPI twice daily, patient has osteoporosis and would like to try once a day.  She denies dysphagia, melena.  She has associated nausea but no vomiting, took ondansetron that helped.  She continues to have bloating, increase gas, and AB pain. will have BM every 2-3 days. She took suppository. She will take miralax  No unintentional weight loss, no night sweats. Pending urine culture right now for UTI symptoms  She reports NSAID use, a few times a week.  She reports ETOH use, 3 x week a cocktail on weekends.  She denies family history of gallbladder issues.  She denies tobacco use.  She denies drug use.    She  reports that she quit smoking about 10 years ago. Her smoking use included cigarettes. She has never used smokeless tobacco. She reports current alcohol use of about 5.0 standard drinks of alcohol per week. She reports that she does not use drugs.  RELEVANT LABS AND IMAGING: CBC    Component Value Date/Time   WBC 5.1 08/12/2022 0953   RBC 4.75 08/12/2022 0953   HGB 14.3 08/12/2022 0953   HCT 43.0 08/12/2022 0953   PLT 268.0 08/12/2022 0953   MCV 90.5 08/12/2022 0953   MCV 94.1 12/29/2013 1032   MCH 28.9 05/25/2022 1641   MCHC 33.3 08/12/2022 0953   RDW 13.6 08/12/2022 0953   LYMPHSABS 1.4  08/12/2022 0953   MONOABS 0.6 08/12/2022 0953   EOSABS 0.1 08/12/2022 0953   BASOSABS 0.0 08/12/2022 0953   Recent Labs    12/23/21 1346 02/12/22 0000 05/25/22 1641 08/12/22 0953   HGB 12.8 13.5 14.5 14.3    CMP     Component Value Date/Time   NA 142 08/12/2022 0953   K 4.6 08/12/2022 0953   CL 103 08/12/2022 0953   CO2 31 08/12/2022 0953   GLUCOSE 106 (H) 08/12/2022 0953   BUN 13 08/12/2022 0953   CREATININE 0.71 08/12/2022 0953   CREATININE 0.64 06/07/2022 1148   CALCIUM 10.2 08/12/2022 0953   PROT 7.0 08/12/2022 0953   ALBUMIN 4.5 08/12/2022 0953   AST 17 08/12/2022 0953   ALT 16 08/12/2022 0953   ALKPHOS 56 08/12/2022 0953   BILITOT 0.5 08/12/2022 0953   GFRNONAA >60 12/23/2021 1417   GFRNONAA 94 02/12/2020 1559   GFRAA 109 02/12/2020 1559      Latest Ref Rng & Units 08/12/2022    9:53 AM 05/25/2022    4:41 PM 02/12/2022   12:00 AM  Hepatic Function  Total Protein 6.0 - 8.3 g/dL 7.0  6.9  6.8   Albumin 3.5 - 5.2 g/dL 4.5     AST 0 - 37 U/L 17  13  16    ALT 0 - 35 U/L 16  15  14    Alk Phosphatase 39 - 117 U/L 56     Total Bilirubin 0.2 - 1.2 mg/dL 0.5  0.4  0.4       Latest Ref Rng & Units 06/15/2021   10:27 AM  Hepatitis C  HCV Quanitative Log log IU/mL <1.18     Current Medications:   Current Outpatient Medications (Endocrine & Metabolic):    estradiol (ESTRACE) 0.5 MG tablet, Take 0.5 tablets (0.25 mg total) by mouth every other day.  Current Outpatient Medications (Cardiovascular):    diltiazem (CARDIZEM) 30 MG tablet, Take 1 tab twice a day as needed for palpitations (Patient taking differently: Take 30 mg by mouth as needed. Take 1 tab twice a day as needed for palpitations)   rosuvastatin (CRESTOR) 20 MG tablet, Take  1 tablet  Daily  for Cholesterol    Current Outpatient Medications (Hematological):    Cyanocobalamin (B-12 PO), Take by mouth.  Current Outpatient Medications (Other):    ALPRAZolam (XANAX) 0.25 MG tablet, TAKE 1/2 TO 1 TABLET BY MOUTH EVERY NIGHT AT BEDTIME ONLY AS NEEDED FOR SLEEP. LIMIT TO 5 DAYS PER WEEK TO AVOID ADDICTION AND DEMENTIA   amoxicillin-clavulanate (AUGMENTIN) 875-125 MG tablet, Take 1 tablet  by mouth 2 (two) times daily.   ascorbic acid (VITAMIN C) 500 MG tablet, Take 250 mg by mouth every other day.   bimatoprost (LATISSE) 0.03 % ophthalmic solution, Apply 1 drop to eye at bedtime.   CALCIUM CITRATE PO, Take 1 tablet by mouth daily.   Cholecalciferol (VITAMIN D) 50 MCG (2000 UT) tablet, Take 2,000 Units by mouth daily.   dicyclomine (BENTYL) 10 MG capsule, Take 1 capsule (10 mg total) by mouth 4 (four) times daily -  before meals and at bedtime. (Patient taking differently: Take 10 mg by mouth as needed.)   estradiol (ESTRACE) 0.1 MG/GM vaginal cream, Use small amount externally nightly   hyoscyamine (LEVSIN) 0.125 MG tablet, Take 1 tablet (0.125 mg total) by mouth every 4 (four) hours as needed for cramping (diarrhea, nausea).   oxybutynin (DITROPAN-XL) 10 MG 24 hr tablet, Take 10 mg by mouth  at bedtime.   pantoprazole (PROTONIX) 40 MG tablet, TAKE 1 TABLET BY MOUTH 2 TIMES A DAY BEFORE A MEAL   polyethylene glycol powder (GLYCOLAX/MIRALAX) 17 GM/SCOOP powder, as needed.   valACYclovir (VALTREX) 500 MG tablet, Take 1 tablet (500 mg total) by mouth daily.  Medical History:  Past Medical History:  Diagnosis Date   Allergy    ANAL FISSURE, HX OF 09/24/2008   Qualifier: Diagnosis of  By: Katrinka Blazing CMA, Tresa Endo     Anxiety    Arthritis    Cancer [facility])    skin cancer   Fibroids    Uterine   GERD (gastroesophageal reflux disease)    Hepatitis    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   History of kidney stones    HSV-2 (herpes simplex virus 2) infection    Hyperlipidemia    IBS (irritable bowel syndrome)    Interstitial cystitis    Nevus    vulva nevus   Prediabetes    Allergies:  Allergies  Allergen Reactions   Sulfa Antibiotics Hives, Shortness Of Breath and Rash   Codeine Nausea And Vomiting   Doxycycline Nausea Only   Macrobid [Nitrofurantoin Macrocrystal] Nausea Only     Surgical History:  She  has a past surgical history that includes Cholecystectomy;  tubal reconstruction; Bladder repair; Tonsillectomy; Vaginal delivery; laparoscopy (1985/ 1990); tubalplasty (1985/ 1990 ); Bunionectomy; Lithotripsy; Colonoscopy; Upper gastrointestinal endoscopy; and Total hip arthroplasty (Right, 01/04/2022). Family History:  Her family history includes Breast cancer in her paternal grandmother; Cancer (age of onset: 17) in her sister; Celiac disease in her mother; Diabetes in her paternal grandmother; Heart disease in her father and sister; Irritable bowel syndrome in her sister; Kidney disease in an other family member; Lung cancer in her maternal grandmother; Pancreatic cancer in her maternal grandmother; Thyroid disease in her sister.  REVIEW OF SYSTEMS  : All other systems reviewed and negative except where noted in the History of Present Illness.  PHYSICAL EXAM: LMP 01/20/2011  General Appearance: Well nourished, in no apparent distress. Head:   Normocephalic and atraumatic. Eyes:  sclerae anicteric,conjunctive pink  Respiratory: Respiratory effort normal, BS equal bilaterally without rales, rhonchi, wheezing. Cardio: RRR with no MRGs. Peripheral pulses intact.  Abdomen: Soft,  Non-distended ,active bowel sounds. Mild to moderate tenderness in the epigastrium and in the RUQ. Without guarding and Without rebound. No masses. Rectal: Not evaluated Musculoskeletal: Full ROM, Normal gait. Without edema. Skin:  Dry and intact without significant lesions or rashes Neuro: Alert and  oriented x4;  No focal deficits. Psych:  Cooperative. Normal mood and affect.    Doree Albee, PA-C 2:55 PM

## 2022-12-17 ENCOUNTER — Encounter: Payer: Self-pay | Admitting: Physician Assistant

## 2022-12-17 ENCOUNTER — Ambulatory Visit (INDEPENDENT_AMBULATORY_CARE_PROVIDER_SITE_OTHER): Payer: Medicare Other | Admitting: Physician Assistant

## 2022-12-17 VITALS — BP 102/60 | HR 65 | Ht 63.0 in | Wt 111.0 lb

## 2022-12-17 DIAGNOSIS — K219 Gastro-esophageal reflux disease without esophagitis: Secondary | ICD-10-CM

## 2022-12-17 DIAGNOSIS — R1011 Right upper quadrant pain: Secondary | ICD-10-CM

## 2022-12-17 DIAGNOSIS — Z860100 Personal history of colon polyps, unspecified: Secondary | ICD-10-CM

## 2022-12-17 DIAGNOSIS — R1013 Epigastric pain: Secondary | ICD-10-CM | POA: Diagnosis not present

## 2022-12-17 DIAGNOSIS — K581 Irritable bowel syndrome with constipation: Secondary | ICD-10-CM

## 2022-12-17 DIAGNOSIS — R140 Abdominal distension (gaseous): Secondary | ICD-10-CM | POA: Diagnosis not present

## 2022-12-17 MED ORDER — FAMOTIDINE 40 MG PO TABS: 40.0000 mg | ORAL_TABLET | Freq: Every day | ORAL | 3 refills | Status: AC

## 2022-12-17 NOTE — Progress Notes (Signed)
____________________________________________________________  Attending physician addendum:  Thank you for sending this case to me. I have reviewed the entire note and agree with the plan.   Malyssa Maris [name], MD  ____________________________________________________________  

## 2022-12-17 NOTE — Patient Instructions (Addendum)
You have been given a testing kit to check for small intestine bacterial overgrowth (SIBO) which is completed by a company named [facility]. Make sure to return your test in the mail using the return mailing label given to you along with the kit. The test order, your demographic and insurance information have all already been sent to the company. [facility] will collect an upfront charge of $99.74 for commercial insurance plans and $209.74 if you are paying cash. Make sure to discuss with [facility] PRIOR to having the test to see if they have gotten information from your insurance company as to how much your testing will cost out of pocket, if any. Please contact [facility] at phone number [phone] to get instructions regarding how to perform the test as our office is unable to give specific testing instructions.  We may want to evaluate you for small intestinal bacterial overgrowth, this can cause increase gas, bloating, loose stools or constipation.  There is a test for this we can do or sometimes we will treat a patient with an antibiotic to see if it helps.   Can drop down to protonix 40 mg once daily 30 mins-1 hour before food and take pepcid 40 mg at nightly or as needed Please take this medication 30 minutes to 1 hour before meals- this makes it more effective.  Avoid spicy and acidic foods Avoid fatty foods Limit your intake of coffee, tea, alcohol, and carbonated drinks Work to maintain a healthy weight Keep the head of the bed elevated at least 3 inches with blocks or a wedge pillow if you are having any nighttime symptoms Stay upright for 2 hours after eating Avoid meals and snacks three to four hours before bedtime   Abdominal bloating and discomfort may be due to intestinal sensitivity or symptoms of irritable bowel syndrome. To relieve symptoms, avoid:  Broccoli  Baked beans  Cabbage  Carbonated drinks  Cauliflower  Chewing gum  Hard candy Abdominal  distention resulting from weak abdominal muscles:  Is better in the morning  Gets worse as the day progresses  Is relieved by lying down Flatulence is gas created through bacterial action in the bowel and passed rectally. Keep in mind that:  10-18 passages per day are normal  Primary gases are harmless and odorless  Noticeable smells are trace gases related to food intake Foods to AVOID that are likely to form gas include:  Milk, dairy products, and medications that contain lactose--If your body doesn't produce the enzyme (lactase) to break it down.  Certain vegetables--baked beans, cauliflower, broccoli, cabbage  Certain starches--wheat, oats, corn, potatoes. Rice is a good substitute. Identify offending foods. Reduce or eliminate these gas-forming foods from your diet. Can look at the FODMAP diet.     FODMAP stands for fermentable oligo-, di-, mono-saccharides and polyols (1). These are the scientific terms used to classify groups of carbs that are difficult for our body to digest and that are notorious for triggering digestive symptoms like bloating, gas, loose stools and stomach pain.   You can try low FODMAP diet  - start with eliminating just one column at a time that you feel may be a trigger for you. - the table at the very bottom contains foods that are low in FODMAPs   Sometimes trying to eliminate the FODMAP's from your diet is difficult or tricky, if you are stuggling with trying to do the elimination diet you can try an enzyme.  There is a food enzymes that you sprinkle  in or on your food that helps break down the FODMAP. You can read more about the enzyme by going to this site: https://fodzyme.com/

## 2022-12-20 ENCOUNTER — Other Ambulatory Visit: Payer: Self-pay | Admitting: Nurse Practitioner

## 2022-12-20 DIAGNOSIS — G4700 Insomnia, unspecified: Secondary | ICD-10-CM

## 2023-01-01 ENCOUNTER — Other Ambulatory Visit: Payer: Self-pay | Admitting: Physician Assistant

## 2023-01-01 DIAGNOSIS — K219 Gastro-esophageal reflux disease without esophagitis: Secondary | ICD-10-CM

## 2023-01-01 DIAGNOSIS — R1010 Upper abdominal pain, unspecified: Secondary | ICD-10-CM

## 2023-01-20 ENCOUNTER — Other Ambulatory Visit: Payer: Self-pay | Admitting: Nurse Practitioner

## 2023-01-20 DIAGNOSIS — G4700 Insomnia, unspecified: Secondary | ICD-10-CM

## 2023-02-10 ENCOUNTER — Ambulatory Visit (HOSPITAL_BASED_OUTPATIENT_CLINIC_OR_DEPARTMENT_OTHER): Payer: 59 | Admitting: Obstetrics & Gynecology

## 2023-02-13 NOTE — Progress Notes (Deleted)
 COMPLETE PHYSICAL  Assessment and Plan:  Encounter for general adult medical examination with abnormal findings Due Yearly  Epigastric pain  Normal endoscopy 09/14/19- continue to follow with Dr. Earlene Plater Continue dietary modifications, medication and continue to monitor   Mixed hyperlipidemia Continue medications:rosuvastatin 10mg  Discussed dietary and exercise modifications Low fat diet       -     Lipid panel -     EKG 12-Lead   Irritable bowel syndrome with constipation Increase fiber, diet discussed Discussed metamucil Uses miralax prn  Anxiety state -continue medications: Alprazolam 0.25mg  half to one prn. , stress management techniques discussed, increase water, good sleep hygiene discussed, increase exercise, and increase veggies.   Chronic interstitial cystitis Follows with Urology Continue to monitor  Abnormal glucose Discussed disease progression and risks Discussed diet/exercise, weight management and risk modification -     Defer A1C today, weight stable, normal A1C last visit  HSV-2 infection Continue medications  Allergic state, sequela Continue OTC meds  Abnormal Glucose Continue diet and exercise - A1c  Vitamin D deficiency .Continue supplementation to maintain goal of 70-100 Taking Vitamin D 2,000 IU daily Recent increasel -     VITAMIN D 25 Hydroxy (Vit-D Deficiency, Fractures)  B12 def  Taking 1,000mg  every other day supplementation -B12  Vasmotor symptom  Estradiol 0.5mg  half tablet, progesterone 2.5mg  half tablet - increase to daily  Medication management -     CBC with Differential/Platelet -    CMP -     Magnesium  Abnormal TSH -     TSH  Screening ischemic heart disease -EKG  Screening blood protein in Urine -UA routine with reflex microscopic - Microalbumin/creatinine urine ratio  BMI 19.52 Add on protein powder, start to work out again.   Vaginal atrophy Encouraged to take estrace and Provera daily to determine if  this helps with recurrent UTI symptoms.   Begin Fem Dophilus probiotic that was recommended by GYN  Quit smoking 9 years ago.  Discussed med's effects and SE's. Screening labs and tests as requested with regular follow-up as recommended. Future Appointments  Date Time Provider Department Center  02/14/2023  9:00 AM Raynelle Dick, NP [facility] None  04/21/2023  2:15 PM Jerene Bears, MD [facility]  08/17/2023 11:30 AM Raynelle Dick, NP [facility] None  02/14/2024 10:00 AM Raynelle Dick, NP [facility] None    HPI  68 y.o. female  presents for a complete physical.  Frequent UTI's, she has interstitial cystitis.  Frequency remains present over urgency.  She is taking oxybutinin which is helping with frequency and spasms.  She follows with Dr Evens Q34month.  She also had kidney stones. She was recently seen for annual GYN visit and they have started  She has had severe epigastric pressure that goes over her RUQ, will happen mainly at night, non exertional, every couple of months. Has had her gallbladder removed in the past, states it feels like that. She is on omeprazole every day. EGD 2014.  She is not on tumeric over the counter. She does wine/mixed drink 3 days a week. She is following with Dr. Myrtie Neither.       Her blood pressure has been controlled at home, today their BP is  .   BP Readings from Last 3 Encounters:  12/17/22 102/60  10/21/22 (!) 153/68  09/23/22 110/82   She does workout. She denies chest pain, shortness of breath, dizziness.   BMI is There is no height or weight on file to  calculate BMI., she is working on diet and exercise. Wt Readings from Last 3 Encounters:  12/17/22 111 lb (50.3 kg)  10/21/22 110 lb 9.6 oz (50.2 kg)  09/23/22 105 lb (47.6 kg)    She is on cholesterol medication. She is doing 20 mg every other day. Her cholesterol is at goal. The cholesterol last visit was:  Lab Results  Component Value Date   CHOL 195 08/13/2022   HDL 66  08/13/2022   LDLCALC 113 (H) 08/13/2022   TRIG 75 08/13/2022   CHOLHDL 3.0 08/13/2022   She has been working on diet and exercise for prediabetes, she is on bASA, she is not on ACE/ARB and denies foot ulcerations, hyperglycemia, hypoglycemia , increased appetite, nausea, paresthesia of the feet, polydipsia, polyuria, visual disturbances, vomiting and weight loss. Last A1C in the office was:  Lab Results  Component Value Date   HGBA1C 5.6 02/12/2022   Patient is on Vitamin D supplement.   Lab Results  Component Value Date   VD25OH 68 08/13/2022      She is very stressed, keeps her grand babies and another baby 3 days a week, 1 under 2 one is 2 and one is 66 years old. She mainly takes xanax 0.25 1/2-1 tab as needed at night, none during the day.   Current Medications:   Current Outpatient Medications (Endocrine & Metabolic):    estradiol (ESTRACE) 0.5 MG tablet, Take 0.5 tablets (0.25 mg total) by mouth every other day.  Current Outpatient Medications (Cardiovascular):    diltiazem (CARDIZEM) 30 MG tablet, Take 1 tab twice a day as needed for palpitations (Patient taking differently: Take 30 mg by mouth as needed. Take 1 tab twice a day as needed for palpitations)   rosuvastatin (CRESTOR) 20 MG tablet, Take  1 tablet  Daily  for Cholesterol    Current Outpatient Medications (Hematological):    Cyanocobalamin (B-12 PO), Take by mouth.  Current Outpatient Medications (Other):    ALPRAZolam (XANAX) 0.25 MG tablet, TAKE A HALF TO 1 TABLET BY MOUTH AT BEDTIME ONLY AS NEEDED FOR SLEEP LIMIT TO 5 DAYS PER WEEK TO AVOID ADDICTION AND DEMENTIA   ascorbic acid (VITAMIN C) 500 MG tablet, Take 250 mg by mouth every other day.   bimatoprost (LATISSE) 0.03 % ophthalmic solution, Apply 1 drop to eye at bedtime.   CALCIUM CITRATE PO, Take 1 tablet by mouth daily.   Cholecalciferol (VITAMIN D) 50 MCG (2000 UT) tablet, Take 2,000 Units by mouth daily.   dicyclomine (BENTYL) 10 MG capsule, Take 1  capsule (10 mg total) by mouth 4 (four) times daily -  before meals and at bedtime. (Patient taking differently: Take 10 mg by mouth as needed.)   estradiol (ESTRACE) 0.1 MG/GM vaginal cream, Use small amount externally nightly   famotidine (PEPCID) 40 MG tablet, Take 1 tablet (40 mg total) by mouth at bedtime.   hyoscyamine (LEVSIN) 0.125 MG tablet, Take 1 tablet (0.125 mg total) by mouth every 4 (four) hours as needed for cramping (diarrhea, nausea).   oxybutynin (DITROPAN-XL) 10 MG 24 hr tablet, Take 10 mg by mouth at bedtime.   pantoprazole (PROTONIX) 40 MG tablet, TAKE 1 TABLET BY MOUTH 2 TIMES A DAY BEFORE A MEAL   polyethylene glycol powder (GLYCOLAX/MIRALAX) 17 GM/SCOOP powder, as needed.   valACYclovir (VALTREX) 500 MG tablet, Take 1 tablet (500 mg total) by mouth daily.  Health Maintenance:   Immunization History  Administered Date(s) Administered   DTaP 01/26/2004   Influenza  Inj Mdck Quad Pf 10/29/2016, 10/17/2017, 10/20/2018   Influenza,inj,Quad PF,6+ Mos 10/20/2018, 10/22/2019   Influenza-Unspecified 10/22/2015, 10/29/2016, 10/17/2017, 10/20/2018, 10/23/2020, 11/05/2020, 10/29/2021   PFIZER Comirnaty(Gray Top)Covid-19 Tri-Sucrose Vaccine 04/17/2019, 05/08/2019   Pfizer Covid-19 Vaccine Bivalent Booster 33yrs & up 01/01/2020   Tdap 01/28/2014   Zoster, Live 01/26/2007   Tetanus: 2016 Flu vaccine: 11/05/20 Zostavax: 2009 Shingrix: Discussed with patient, at age 34.  Pap: 2017 Dr. Hyacinth Meeker MGM: 10/03/20 DEXA 10/17/20, osteoporosis Colonoscopy: 2014, due 2024 Dr. Earlene Plater Last Dental Exam:  Dr. Autumn Messing Last Eye Exam: Dr. Abel Presto Hep C: Negative  Patient Care Team: Raynelle Dick, NP as PCP - General (Nurse Practitioner) Hart Carwin, MD (Inactive) as Consulting Physician (Gastroenterology)    Medical History:  Past Medical History:  Diagnosis Date   Allergy    ANAL FISSURE, HX OF 09/24/2008   Qualifier: Diagnosis of  By: Katrinka Blazing CMA, Kelly     Anxiety    Arthritis     Cancer [facility])    skin cancer   Fibroids    Uterine   GERD (gastroesophageal reflux disease)    Hepatitis    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   History of kidney stones    HSV-2 (herpes simplex virus 2) infection    Hyperlipidemia    IBS (irritable bowel syndrome)    Interstitial cystitis    Nevus    vulva nevus   Prediabetes    Allergies Allergies  Allergen Reactions   Sulfa Antibiotics Hives, Shortness Of Breath and Rash   Codeine Nausea And Vomiting   Doxycycline Nausea Only   Macrobid [Nitrofurantoin Macrocrystal] Nausea Only    SURGICAL HISTORY She  has a past surgical history that includes Cholecystectomy; tubal reconstruction; Bladder repair; Tonsillectomy; Vaginal delivery; laparoscopy (1985/ 1990); tubalplasty (1985/ 1990 ); Bunionectomy; Lithotripsy; Colonoscopy; Upper gastrointestinal endoscopy; and Total hip arthroplasty (Right, 01/04/2022). FAMILY HISTORY Her family history includes Breast cancer in her paternal grandmother; Cancer in her father; Cancer (age of onset: 30) in her sister; Celiac disease in her mother; Diabetes in her paternal grandmother; Heart disease in her father and sister; Irritable bowel syndrome in her sister; Kidney disease in an other family member; Lung cancer in her maternal grandmother; Pancreatic cancer in her maternal grandmother; Thyroid disease in her sister. SOCIAL HISTORY She  reports that she quit smoking about 11 years ago. Her smoking use included cigarettes. She has never used smokeless tobacco. She reports current alcohol use of about 5.0 standard drinks of alcohol per week. She reports that she does not use drugs.  Review of Systems: Review of Systems  Constitutional:  Negative for chills and fever.  HENT:  Negative for congestion, hearing loss, sinus pain, sore throat and tinnitus.   Eyes:  Negative for blurred vision and double vision.  Respiratory:  Negative for cough, hemoptysis, sputum production, shortness of  breath and wheezing.   Cardiovascular:  Negative for chest pain, palpitations and leg swelling.  Gastrointestinal:  Positive for heartburn. Negative for abdominal pain, constipation, diarrhea, nausea and vomiting.  Genitourinary:  Negative for dysuria and urgency.  Musculoskeletal:  Negative for back pain, falls, joint pain, myalgias and neck pain.  Skin:  Negative for rash.  Neurological:  Negative for dizziness, tingling, tremors, weakness and headaches.  Endo/Heme/Allergies:  Does not bruise/bleed easily.  Psychiatric/Behavioral:  Negative for depression and suicidal ideas. The patient is nervous/anxious and has insomnia.     Physical Exam: Estimated body mass index is 19.66 kg/m as calculated from  the following:   Height as of 12/17/22: 5\' 3"  (1.6 m).   Weight as of 12/17/22: 111 lb (50.3 kg). LMP 01/20/2011   General Appearance: Very pleasant thin female in no apparent distress.  Eyes: PERRLA, EOMs, conjunctiva no swelling or erythema ENT/Mouth: Ear canals normal without obstruction, swelling, erythema, or discharge.  TMs normal bilaterally with no erythema, bulging, retraction, or loss of landmark.  Oropharynx moist and clear with no exudate, erythema, or swelling.   Neck: Supple, thyroid normal. No bruits.  No cervical adenopathy Respiratory: Respiratory effort normal, Breath sounds clear A&P without wheeze, rhonchi, rales.   Cardio: RRR without murmurs, rubs or gallops. Brisk peripheral pulses without edema.  Chest: symmetric, with normal excursions Breasts: Deferred to GYN Abdomen: Soft, nontender, no guarding, rebound, hernias, masses, or organomegaly.  Lymphatics: Non tender without lymphadenopathy.  Musculoskeletal: Full ROM all peripheral extremities,5/5 strength, and normal gait.  Skin: Warm, dry without rashes, lesions, ecchymosis. Neuro: Awake and oriented X 3, Cranial nerves intact, reflexes equal bilaterally. Normal muscle tone, no cerebellar symptoms. Sensation  intact.  Psych:  normal affect, Insight and Judgment appropriate.   EKG: NSR, no ST changes   Over 40 minutes of exam, counseling, chart review and critical decision making was performed  Noha Karasik E  3:54 PM [facility]

## 2023-02-14 ENCOUNTER — Encounter: Payer: Medicare Other | Admitting: Nurse Practitioner

## 2023-02-14 DIAGNOSIS — Z87891 Personal history of nicotine dependence: Secondary | ICD-10-CM

## 2023-02-14 DIAGNOSIS — R002 Palpitations: Secondary | ICD-10-CM

## 2023-02-14 DIAGNOSIS — K219 Gastro-esophageal reflux disease without esophagitis: Secondary | ICD-10-CM

## 2023-02-14 DIAGNOSIS — Z681 Body mass index (BMI) 19 or less, adult: Secondary | ICD-10-CM

## 2023-02-14 DIAGNOSIS — Z0001 Encounter for general adult medical examination with abnormal findings: Secondary | ICD-10-CM

## 2023-02-14 DIAGNOSIS — N3010 Interstitial cystitis (chronic) without hematuria: Secondary | ICD-10-CM

## 2023-02-14 DIAGNOSIS — J309 Allergic rhinitis, unspecified: Secondary | ICD-10-CM

## 2023-02-14 DIAGNOSIS — E559 Vitamin D deficiency, unspecified: Secondary | ICD-10-CM

## 2023-02-14 DIAGNOSIS — E782 Mixed hyperlipidemia: Secondary | ICD-10-CM

## 2023-02-14 DIAGNOSIS — R7309 Other abnormal glucose: Secondary | ICD-10-CM

## 2023-02-14 DIAGNOSIS — Z1329 Encounter for screening for other suspected endocrine disorder: Secondary | ICD-10-CM

## 2023-02-14 DIAGNOSIS — B009 Herpesviral infection, unspecified: Secondary | ICD-10-CM

## 2023-02-14 DIAGNOSIS — N952 Postmenopausal atrophic vaginitis: Secondary | ICD-10-CM

## 2023-02-14 DIAGNOSIS — K581 Irritable bowel syndrome with constipation: Secondary | ICD-10-CM

## 2023-02-14 DIAGNOSIS — Z1389 Encounter for screening for other disorder: Secondary | ICD-10-CM

## 2023-02-14 DIAGNOSIS — Z136 Encounter for screening for cardiovascular disorders: Secondary | ICD-10-CM

## 2023-02-14 DIAGNOSIS — F411 Generalized anxiety disorder: Secondary | ICD-10-CM

## 2023-02-14 DIAGNOSIS — E538 Deficiency of other specified B group vitamins: Secondary | ICD-10-CM

## 2023-02-14 DIAGNOSIS — Z79899 Other long term (current) drug therapy: Secondary | ICD-10-CM

## 2023-02-16 ENCOUNTER — Encounter: Payer: Self-pay | Admitting: Nurse Practitioner

## 2023-02-20 ENCOUNTER — Other Ambulatory Visit: Payer: Self-pay | Admitting: Nurse Practitioner

## 2023-02-20 DIAGNOSIS — G4700 Insomnia, unspecified: Secondary | ICD-10-CM

## 2023-02-21 ENCOUNTER — Encounter: Payer: Medicare Other | Admitting: Nurse Practitioner

## 2023-03-14 ENCOUNTER — Other Ambulatory Visit: Payer: Self-pay | Admitting: Family

## 2023-03-14 DIAGNOSIS — G4700 Insomnia, unspecified: Secondary | ICD-10-CM

## 2023-03-14 MED ORDER — ALPRAZOLAM 0.25 MG PO TABS
ORAL_TABLET | ORAL | 0 refills | Status: DC
Start: 2023-03-14 — End: 2023-04-12

## 2023-03-15 ENCOUNTER — Encounter: Payer: Medicare Other | Admitting: Nurse Practitioner

## 2023-04-12 ENCOUNTER — Encounter: Payer: Self-pay | Admitting: Family

## 2023-04-12 ENCOUNTER — Ambulatory Visit (INDEPENDENT_AMBULATORY_CARE_PROVIDER_SITE_OTHER): Payer: Medicare Other | Admitting: Family

## 2023-04-12 VITALS — BP 121/57 | HR 60 | Temp 97.5°F | Ht 64.0 in | Wt 110.2 lb

## 2023-04-12 DIAGNOSIS — Z87898 Personal history of other specified conditions: Secondary | ICD-10-CM

## 2023-04-12 DIAGNOSIS — Z79890 Hormone replacement therapy (postmenopausal): Secondary | ICD-10-CM | POA: Insufficient documentation

## 2023-04-12 DIAGNOSIS — K589 Irritable bowel syndrome without diarrhea: Secondary | ICD-10-CM

## 2023-04-12 DIAGNOSIS — G4700 Insomnia, unspecified: Secondary | ICD-10-CM | POA: Insufficient documentation

## 2023-04-12 DIAGNOSIS — N3010 Interstitial cystitis (chronic) without hematuria: Secondary | ICD-10-CM

## 2023-04-12 DIAGNOSIS — R350 Frequency of micturition: Secondary | ICD-10-CM

## 2023-04-12 DIAGNOSIS — E782 Mixed hyperlipidemia: Secondary | ICD-10-CM

## 2023-04-12 LAB — POCT URINALYSIS DIPSTICK
Bilirubin, UA: NEGATIVE
Blood, UA: NEGATIVE
Glucose, UA: NEGATIVE
Ketones, UA: NEGATIVE
Leukocytes, UA: NEGATIVE
Nitrite, UA: NEGATIVE
Protein, UA: NEGATIVE
Spec Grav, UA: 1.015 (ref 1.010–1.025)
Urobilinogen, UA: 0.2 U/dL
pH, UA: 6 (ref 5.0–8.0)

## 2023-04-12 MED ORDER — ALPRAZOLAM 0.25 MG PO TABS: ORAL_TABLET | ORAL | 2 refills | Status: DC

## 2023-04-12 MED ORDER — ALPRAZOLAM 0.25 MG PO TABS
ORAL_TABLET | ORAL | Status: DC
Start: 2023-04-12 — End: 2023-04-12

## 2023-04-12 NOTE — Patient Instructions (Signed)
 Welcome to [facility], It was a pleasure meeting you today!    Your urine is negative for infection today. Continue to drink at least 2 liters of water daily and keep your upcoming Urology appointment.  You can schedule a physical with fasting labs at your convenience today.    PLEASE NOTE: If you had any LAB tests please let us know if you have not heard back within a few days. You may see your results on MyChart before we have a chance to review them but we will give you a call once they are reviewed by [place]. If we ordered any REFERRALS today, please let us know if you have not heard from their office within the next week.  Let us know through MyChart if you are needing REFILLS, or have your pharmacy send [place] the request. You can also use MyChart to communicate with me or any office staff.  Please try these tips to maintain a healthy lifestyle: It is important that you exercise regularly at least 30 minutes 5 times a week. Think about what you will eat, plan ahead. Choose whole foods, & think  "clean, green, fresh or frozen" over canned, processed or packaged foods which are more sugary, salty, and fatty. 70 to 75% of food eaten should be fresh vegetables and protein. 2-3  meals daily with healthy snacks between meals, but must be whole fruit, protein or vegetables. Aim to eat over a 10 hour period when you are active, for example, 7am to 5pm, and then STOP after your last meal of the day, drinking only water.  Shorter eating windows, 6-8 hours, are showing benefits in heart disease and blood sugar regulation. Drink water every day! Shoot for 64 ounces daily = 8 cups, no other drink is as healthy! Fruit juice is best enjoyed in a healthy way, by EATING the fruit.

## 2023-04-12 NOTE — Assessment & Plan Note (Signed)
 Recurrent UTIs with persistent symptoms despite negative UA today. Reports last UTI in 02/2023. Interstitial cystitis complicates diagnosis. Has allergy or intolerance to several antibiotics. - Consider prophylactic low-dose antibiotic therapy with Macrobid 50 mg or every other day dosing, if tolerated, discuss with Urology at upcoming appointment. -Continue to drink at least 2L water qd. - Continue Pyridium for symptomatic relief as needed. - Follow-up with urology for further management of recurrent UTIs and interstitial cystitis.

## 2023-04-12 NOTE — Assessment & Plan Note (Signed)
 Slightly elevated glucose levels noted in July 2024. Advised to monitor carbohydrate intake. - Schedule a physical exam to include glucose testing to monitor blood sugar levels.

## 2023-04-12 NOTE — Assessment & Plan Note (Signed)
 Condition managed with Crestor. No recent lab results available. - Schedule a physical exam to include lipid panel to assess current cholesterol levels.

## 2023-04-12 NOTE — Assessment & Plan Note (Signed)
 Chronic insomnia managed with Xanax. Reports disturbed sleep with frequent nocturnal awakenings to urinate. Discussed sleep hygiene practices. Reports her dose has been reduced to every other night and she is unsure if due to insurance or provider choice.  - Consider adjusting timing of oxybutynin to nighttime to potentially reduce nocturnal awakenings. - Discuss sleep hygiene practices, including avoiding caffeine and maintaining a dark, cool sleeping environment.  -Refill Xanax with refills so monthly calls for refills not needed.

## 2023-04-12 NOTE — Progress Notes (Signed)
 New Patient Office Visit  Subjective:  Patient ID: Patricia Ryan, female    DOB: 10/09/1955  Age: 68 y.o. MRN: [id number]  CC:  Chief Complaint  Patient presents with   New Patient (Initial Visit)   Recurrent UTI'S    Pt c/o Urinary frequency and discomfort, Pt does have a urologist. Has tried AZO, which did relieve sx slightly.    HPI Patricia Ryan presents for establishing care today.  Discussed the use of AI scribe software for clinical note transcription with the patient, who gave verbal consent to proceed.  History of Present Illness   The patient, with a history of recurrent UTIs, interstitial cystitis, high cholesterol, stomach acid issues, and hormone therapy, presents to establish care and with having ongoing urinary issues. She reports frequent urination, especially at night, and occasional abdominal and back pain. She also mentions a history of kidney stones. The patient has been on oxybutynin for urinary issues and has been taking it in the morning. She also reports a history of E. coli infections and a rare bacterial infection starting with a K, possibly Klebsiella. The patient also has a history of high cholesterol, for which she is on medication. She reports stomach acid issues and is on Protonix for this. She has seen a gastroenterologist and has had tests for H. pylori ordered, but she has yet to do this (has kit at home). She has a hx of the infection. She also mentions having had hepatitis A in the past. The patient is on hormone therapy, including estradiol and Provera, for HRT. She has been on this therapy for over two years and has tried to reduce her dosage. She also reports a history of osteoporosis and is taking calcium and D3 supplements. The patient also mentions having a history of anxiety and takes Xanax to help with sleep. She reports disturbed sleep and frequent nighttime urination.      Assessment & Plan:     Chronic IC - Urinary sx - Recurrent UTIs  with persistent symptoms despite negative UA today. Interstitial cystitis complicates diagnosis. Has allergy or intolerance to several antibiotics. - Consider prophylactic low-dose antibiotic therapy with Macrobid 50 mg or every other day dosing, if tolerated, discuss with Urology at upcoming appointment. -Continue to drink at least 2L water qd. - Continue Pyridium for symptomatic relief as needed. - Follow-up with urology for further management of recurrent UTIs and interstitial cystitis.  Gastroesophageal Reflux Disease (GERD) - Chronic acid reflux managed with Protonix. H. pylori breath test pending due to inconvenience and dietary restrictions. - Encourage completion of the H. pylori breath test as recommended by gastroenterologist.  Hyperlipidemia - Condition managed with Crestor. No recent lab results available. - Schedule a physical exam to include lipid panel to assess current cholesterol levels.  Borderline DM  - Slightly elevated glucose levels noted in July 2024. Advised to monitor carbohydrate intake, continue to exercise. - Schedule a physical exam to include glucose testing to monitor blood sugar levels.  Hormone Replacement Therapy (HRT) - Followed by GYN - On estradiol and medroxyprogesterone. Concerns about long-term use and potential risks. Reviewed benefits including cardiovascular, dementia, and bone health. - Discuss HRT management with gynecologist during upcoming appointment.  Insomnia - Chronic insomnia managed with Xanax. Reports disturbed sleep with frequent nocturnal awakenings to urinate. Discussed sleep hygiene practices, and importance of using Xanax only prn d/t risk of dementia and inability to sleep w/o med. Reports her dose has been reduced to every other night  and she is unsure if due to insurance or provider choice.  - Consider adjusting timing of oxybutynin to nighttime to potentially reduce nocturnal awakenings. - Discuss sleep hygiene practices,  including avoiding caffeine and maintaining a dark, cool sleeping environment.  -Refill Xanax with refills so monthly calls for refills not needed.     Subjective:    Outpatient Medications Prior to Visit  Medication Sig Dispense Refill   ascorbic acid (VITAMIN C) 500 MG tablet Take 250 mg by mouth every other day.     bimatoprost (LATISSE) 0.03 % ophthalmic solution Apply 1 drop to eye at bedtime.     CALCIUM CITRATE PO Take 1 tablet by mouth daily. 600 +D     Cyanocobalamin (B-12 PO) Take 500 mcg by mouth daily.     dicyclomine (BENTYL) 10 MG capsule Take 1 capsule (10 mg total) by mouth 4 (four) times daily -  before meals and at bedtime. (Patient taking differently: Take 10 mg by mouth as needed.) 90 capsule 1   diltiazem (CARDIZEM) 30 MG tablet Take 1 tab twice a day as needed for palpitations (Patient taking differently: Take 30 mg by mouth as needed. Take 1 tab twice a day as needed for palpitations) 30 tablet 1   estradiol (ESTRACE) 0.1 MG/GM vaginal cream Use small amount externally nightly 42.5 g 2   estradiol (ESTRACE) 0.5 MG tablet Take 0.5 tablets (0.25 mg total) by mouth every other day. 45 tablet 3   famotidine (PEPCID) 40 MG tablet Take 1 tablet (40 mg total) by mouth at bedtime. 90 tablet 3   hyoscyamine (LEVSIN) 0.125 MG tablet Take 1 tablet (0.125 mg total) by mouth every 4 (four) hours as needed for cramping (diarrhea, nausea). 50 tablet 2   oxybutynin (DITROPAN-XL) 10 MG 24 hr tablet Take 10 mg by mouth at bedtime.     pantoprazole (PROTONIX) 40 MG tablet TAKE 1 TABLET BY MOUTH 2 TIMES A DAY BEFORE A MEAL 60 tablet 1   polyethylene glycol powder (GLYCOLAX/MIRALAX) 17 GM/SCOOP powder as needed.     rosuvastatin (CRESTOR) 20 MG tablet Take  1 tablet  Daily  for Cholesterol 90 tablet 3   valACYclovir (VALTREX) 500 MG tablet Take 1 tablet (500 mg total) by mouth daily. (Patient taking differently: Take 500 mg by mouth daily. Half tab) 90 tablet 3   ALPRAZolam (XANAX) 0.25 MG  tablet TAKE A HALF TO 1 TABLET BY MOUTH AT BEDTIME ONLY AS NEEDED FOR SLEEP LIMIT TO 5 DAYS PER WEEK TO AVOID ADDICTION AND DEMENTIA 18 tablet 0   Cholecalciferol (VITAMIN D) 50 MCG (2000 UT) tablet Take 2,000 Units by mouth daily. (Patient not taking: Reported on 04/12/2023)     medroxyPROGESTERone (PROVERA) 2.5 MG tablet Take 2.5 mg by mouth daily.     No facility-administered medications prior to visit.   Past Medical History:  Diagnosis Date   Allergy    Altered bowel habits 09/07/2019   ANAL FISSURE, HX OF 09/24/2008   Qualifier: Diagnosis of  By: Katrinka Blazing CMA, Kelly     Anxiety    Anxiety state 09/24/2008   Qualifier: Diagnosis of   By: Katrinka Blazing CMA, Tresa Endo         Arthritis    Atypical chest pain 09/07/2019   Cancer (HCC)    skin cancer   Constipation 02/06/2010   Qualifier: Diagnosis of   By: Katrinka Blazing CMA, Tresa Endo         Fibroids    Uterine   GERD (gastroesophageal reflux  disease)    Hepatitis    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   History of kidney stones    HSV-2 (herpes simplex virus 2) infection    Hyperlipidemia    IBS (irritable bowel syndrome)    Interstitial cystitis    Nevus    vulva nevus   Prediabetes    RUQ abdominal pain 09/07/2019   UTI (urinary tract infection) 05/23/2020   Past Surgical History:  Procedure Laterality Date   BLADDER REPAIR     BUNIONECTOMY     CHOLECYSTECTOMY     COLONOSCOPY     LAPAROSCOPY  1985/ 1990   due to infertility   LITHOTRIPSY     TONSILLECTOMY     TOTAL HIP ARTHROPLASTY Right 01/04/2022   Procedure: TOTAL HIP ARTHROPLASTY;  Surgeon: Joen Laura, MD;  Location: [facility];  Service: Orthopedics;  Laterality: Right;   tubal reconstruction     tubalplasty  1985/ 1990    UPPER GASTROINTESTINAL ENDOSCOPY     VAGINAL DELIVERY     x2    Objective:   Today's Vitals: BP (!) 121/57 (BP Location: Left Arm, Patient Position: Sitting, Cuff Size: Normal)   Pulse 60   Temp (!) 97.5 F (36.4 C) (Temporal)   Ht 5\' 4"   (1.626 m)   Wt 110 lb 3.2 oz (50 kg)   LMP 01/20/2011   SpO2 99%   BMI 18.92 kg/m   Physical Exam Vitals and nursing note reviewed.  Constitutional:      Appearance: Normal appearance.  Cardiovascular:     Rate and Rhythm: Normal rate and regular rhythm.  Pulmonary:     Effort: Pulmonary effort is normal.     Breath sounds: Normal breath sounds.  Musculoskeletal:        General: Normal range of motion.  Skin:    General: Skin is warm and dry.  Neurological:     Mental Status: She is alert.  Psychiatric:        Mood and Affect: Mood normal.        Behavior: Behavior normal.     Meds ordered this encounter  Medications   DISCONTD: ALPRAZolam (XANAX) 0.25 MG tablet    Sig: TAKE A HALF TO 1 TABLET BY MOUTH AT BEDTIME ONLY AS NEEDED FOR SLEEP LIMIT TO 5 DAYS PER WEEK TO AVOID ADDICTION AND DEMENTIA    Supervising Provider:   ANDY, CAMILLE L [[id number]]   ALPRAZolam (XANAX) 0.25 MG tablet    Sig: TAKE A HALF TO 1 TABLET BY MOUTH AT BEDTIME ONLY AS NEEDED FOR SLEEP LIMIT USE TO AVOID ADDICTION AND DEMENTIA    Dispense:  18 tablet    Refill:  2    Supervising Provider:   ANDY, CAMILLE L [[id number]]    Dulce Sellar, NP

## 2023-04-15 ENCOUNTER — Other Ambulatory Visit (HOSPITAL_BASED_OUTPATIENT_CLINIC_OR_DEPARTMENT_OTHER): Payer: Self-pay | Admitting: Obstetrics & Gynecology

## 2023-04-15 DIAGNOSIS — B009 Herpesviral infection, unspecified: Secondary | ICD-10-CM

## 2023-04-19 ENCOUNTER — Encounter: Payer: Self-pay | Admitting: Family

## 2023-04-19 ENCOUNTER — Ambulatory Visit (INDEPENDENT_AMBULATORY_CARE_PROVIDER_SITE_OTHER): Admitting: Family

## 2023-04-19 VITALS — BP 130/67 | HR 60 | Temp 97.6°F | Ht 64.0 in | Wt 110.6 lb

## 2023-04-19 DIAGNOSIS — M810 Age-related osteoporosis without current pathological fracture: Secondary | ICD-10-CM | POA: Diagnosis not present

## 2023-04-19 DIAGNOSIS — Z0001 Encounter for general adult medical examination with abnormal findings: Secondary | ICD-10-CM | POA: Diagnosis not present

## 2023-04-19 DIAGNOSIS — Z0000 Encounter for general adult medical examination without abnormal findings: Secondary | ICD-10-CM

## 2023-04-19 DIAGNOSIS — K219 Gastro-esophageal reflux disease without esophagitis: Secondary | ICD-10-CM

## 2023-04-19 DIAGNOSIS — K581 Irritable bowel syndrome with constipation: Secondary | ICD-10-CM | POA: Diagnosis not present

## 2023-04-19 DIAGNOSIS — M2550 Pain in unspecified joint: Secondary | ICD-10-CM

## 2023-04-19 DIAGNOSIS — Z87891 Personal history of nicotine dependence: Secondary | ICD-10-CM

## 2023-04-19 DIAGNOSIS — K5909 Other constipation: Secondary | ICD-10-CM

## 2023-04-19 DIAGNOSIS — B009 Herpesviral infection, unspecified: Secondary | ICD-10-CM

## 2023-04-19 LAB — CBC WITH DIFFERENTIAL/PLATELET
Basophils Absolute: 0 10*3/uL (ref 0.0–0.1)
Basophils Relative: 0.9 % (ref 0.0–3.0)
Eosinophils Absolute: 0.2 10*3/uL (ref 0.0–0.7)
Eosinophils Relative: 3 % (ref 0.0–5.0)
HCT: 41.6 % (ref 36.0–46.0)
Hemoglobin: 14 g/dL (ref 12.0–15.0)
Lymphocytes Relative: 29.8 % (ref 12.0–46.0)
Lymphs Abs: 1.5 10*3/uL (ref 0.7–4.0)
MCHC: 33.6 g/dL (ref 30.0–36.0)
MCV: 91.3 fl (ref 78.0–100.0)
Monocytes Absolute: 0.5 10*3/uL (ref 0.1–1.0)
Monocytes Relative: 8.8 % (ref 3.0–12.0)
Neutro Abs: 3 10*3/uL (ref 1.4–7.7)
Neutrophils Relative %: 57.5 % (ref 43.0–77.0)
Platelets: 256 10*3/uL (ref 150.0–400.0)
RBC: 4.56 Mil/uL (ref 3.87–5.11)
RDW: 13.1 % (ref 11.5–15.5)
WBC: 5.2 10*3/uL (ref 4.0–10.5)

## 2023-04-19 LAB — LIPID PANEL
Cholesterol: 168 mg/dL (ref 0–200)
HDL: 52.8 mg/dL (ref >39.00)
LDL Cholesterol: 93 mg/dL (ref 0–99)
NonHDL: 114.91
Total CHOL/HDL Ratio: 3
Triglycerides: 111 mg/dL (ref 0.0–149.0)
VLDL: 22.2 mg/dL (ref 0.0–40.0)

## 2023-04-19 LAB — COMPREHENSIVE METABOLIC PANEL
ALT: 15 U/L (ref 0–35)
AST: 19 U/L (ref 0–37)
Albumin: 4.5 g/dL (ref 3.5–5.2)
Alkaline Phosphatase: 60 U/L (ref 39–117)
BUN: 13 mg/dL (ref 6–23)
CO2: 31 meq/L (ref 19–32)
Calcium: 10 mg/dL (ref 8.4–10.5)
Chloride: 104 meq/L (ref 96–112)
Creatinine, Ser: 0.53 mg/dL (ref 0.40–1.20)
GFR: 96.72 mL/min (ref >60.00)
Glucose, Bld: 96 mg/dL (ref 70–99)
Potassium: 3.8 meq/L (ref 3.5–5.1)
Sodium: 142 meq/L (ref 135–145)
Total Bilirubin: 0.5 mg/dL (ref 0.2–1.2)
Total Protein: 6.8 g/dL (ref 6.0–8.3)

## 2023-04-19 LAB — TSH: TSH: 0.67 u[IU]/mL (ref 0.35–5.50)

## 2023-04-19 NOTE — Progress Notes (Unsigned)
 Phone [phone]  Subjective:   Patient is a 68 y.o. female presenting for annual physical.    Chief Complaint  Patient presents with  . Annual Exam    Fasting w/ labs   Discussed the use of AI scribe software for clinical note transcription with the patient, who gave verbal consent to proceed.  History of Present Illness The patient, with a past medical history of genital herpes, osteoporosis, arthritis, and a recent gum graft and hip surgery, presents with ongoing arthritis pain and gastrointestinal issues. The patient reports that the arthritis pain is more severe in the hands and is worse in the morning, improving slightly throughout the day. The patient also mentions that the hands occasionally swell in the summer. The patient has been using Voltaren ointment for pain management.  The patient also reports gastrointestinal issues, including constipation and indigestion. The patient has been taking MiraLAX daily and occasionally uses dicyclomine for stomach cramping. The patient also reports taking pantoprazole for acid reflux. The patient mentions a history of smoking and is currently on hormone therapy. The patient also discusses a recent gum graft and hip surgery, the latter of which has resulted in ongoing pain.  See problem oriented charting- ROS- full  review of systems was completed and negative except for: what is noted in HPI above.  The following were reviewed and entered/updated in epic: Past Medical History:  Diagnosis Date  . Allergy   . Altered bowel habits 09/07/2019  . ANAL FISSURE, HX OF 09/24/2008   Qualifier: Diagnosis of  By: Katrinka Blazing CMA, Tresa Endo    . Anxiety   . Anxiety state 09/24/2008   Qualifier: Diagnosis of   By: Katrinka Blazing CMA, Tresa Endo        . Arthritis   . Atypical chest pain 09/07/2019  . Cancer (HCC)    skin cancer  . Constipation 02/06/2010   Qualifier: Diagnosis of   By: Katrinka Blazing CMA, Tresa Endo        . Fibroids    Uterine  . GERD (gastroesophageal reflux  disease)   . Hepatitis    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  . History of kidney stones   . HSV-2 (herpes simplex virus 2) infection   . Hyperlipidemia   . IBS (irritable bowel syndrome)   . Interstitial cystitis   . Nevus    vulva nevus  . Prediabetes   . RUQ abdominal pain 09/07/2019  . UTI (urinary tract infection) 05/23/2020   Patient Active Problem List   Diagnosis Date Noted  . History of borderline diabetes mellitus 04/12/2023  . Insomnia 04/12/2023  . Hormone replacement therapy (HRT) 04/12/2023  . 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 Polyarthralgia 02/01/2021  . Urinary tract infection due to ESBL Klebsiella 12/09/2020  . Osteoporosis 10/21/2020  . B12 deficiency 02/12/2019  . Vitamin D deficiency 02/12/2019  . 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 IBS (irritable bowel syndrome)   . Hyperlipidemia   . Fibroids   . Chronic interstitial cystitis 11/08/2012   Past Surgical History:  Procedure Laterality Date  . BLADDER REPAIR    . BUNIONECTOMY    . CHOLECYSTECTOMY    . COLONOSCOPY    . LAPAROSCOPY  1985/ 1990   due to infertility  . LITHOTRIPSY    . TONSILLECTOMY    . TOTAL HIP ARTHROPLASTY Right 01/04/2022   Procedure: TOTAL HIP ARTHROPLASTY;  Surgeon: Joen Laura, MD;  Location: [facility];  Service: Orthopedics;  Laterality: Right;  . tubal reconstruction    .  tubalplasty  1985/ 1990   . UPPER GASTROINTESTINAL ENDOSCOPY    . VAGINAL DELIVERY     x2    Family History  Problem Relation Age of Onset  . Celiac disease Mother   . Heart disease Father   . Cancer Father        biospy to be done 12/20/2022  . Irritable bowel syndrome Sister   . Thyroid disease Sister   . Cancer Sister 58       Melanoma  . Heart disease Sister   . Pancreatic cancer Maternal Grandmother   . Lung cancer Maternal Grandmother   . Diabetes Paternal Grandmother   . Breast cancer Paternal Grandmother   . Kidney disease Other        tumor  . Colon cancer Neg  Hx   . Esophageal cancer Neg Hx   . Rectal cancer Neg Hx   . Stomach cancer Neg Hx     Medications- reviewed and updated Current Outpatient Medications  Medication Sig Dispense Refill  . ALPRAZolam (XANAX) 0.25 MG tablet TAKE A HALF TO 1 TABLET BY MOUTH AT BEDTIME ONLY AS NEEDED FOR SLEEP LIMIT USE TO AVOID ADDICTION AND DEMENTIA 18 tablet 2  . ascorbic acid (VITAMIN C) 500 MG tablet Take 250 mg by mouth every other day.    . bimatoprost (LATISSE) 0.03 % ophthalmic solution Apply 1 drop to eye at bedtime.    Marland Kitchen CALCIUM CITRATE PO Take 1 tablet by mouth daily. 600 +D    . Cholecalciferol (VITAMIN D) 50 MCG (2000 UT) tablet Take 2,000 Units by mouth daily.    . Cyanocobalamin (B-12 PO) Take 500 mcg by mouth daily.    Marland Kitchen dicyclomine (BENTYL) 10 MG capsule Take 1 capsule (10 mg total) by mouth 4 (four) times daily -  before meals and at bedtime. (Patient taking differently: Take 10 mg by mouth as needed.) 90 capsule 1  . diltiazem (CARDIZEM) 30 MG tablet Take 1 tab twice a day as needed for palpitations (Patient taking differently: Take 30 mg by mouth as needed. Take 1 tab twice a day as needed for palpitations) 30 tablet 1  . estradiol (ESTRACE) 0.1 MG/GM vaginal cream Use small amount externally nightly 42.5 g 2  . estradiol (ESTRACE) 0.5 MG tablet Take 0.5 tablets (0.25 mg total) by mouth every other day. 45 tablet 3  . famotidine (PEPCID) 40 MG tablet Take 1 tablet (40 mg total) by mouth at bedtime. 90 tablet 3  . hyoscyamine (LEVSIN) 0.125 MG tablet Take 1 tablet (0.125 mg total) by mouth every 4 (four) hours as needed for cramping (diarrhea, nausea). 50 tablet 2  . medroxyPROGESTERone (PROVERA) 2.5 MG tablet Take 2.5 mg by mouth daily.    Marland Kitchen oxybutynin (DITROPAN-XL) 10 MG 24 hr tablet Take 10 mg by mouth at bedtime.    . pantoprazole (PROTONIX) 40 MG tablet TAKE 1 TABLET BY MOUTH 2 TIMES A DAY BEFORE A MEAL 60 tablet 1  . polyethylene glycol powder (GLYCOLAX/MIRALAX) 17 GM/SCOOP powder as  needed.    . rosuvastatin (CRESTOR) 20 MG tablet Take  1 tablet  Daily  for Cholesterol 90 tablet 3  . valACYclovir (VALTREX) 500 MG tablet TAKE 1 TABLET BY MOUTH DAILY 90 tablet 3   No current facility-administered medications for this visit.    Allergies-reviewed and updated Allergies  Allergen Reactions  . Sulfa Antibiotics Hives, Shortness Of Breath and Rash  . Codeine Nausea And Vomiting  . Doxycycline Nausea Only  . Macrobid [  Nitrofurantoin Macrocrystal] Nausea Only    Social History   Social History Narrative  . Not on file    Objective:  BP 130/67 (BP Location: Left Arm, Patient Position: Sitting, Cuff Size: Normal)   Pulse 60   Temp 97.6 F (36.4 C) (Temporal)   Ht 5\' 4"  (1.626 m)   Wt 110 lb 9.6 oz (50.2 kg)   LMP 01/20/2011   SpO2 99%   BMI 18.98 kg/m  Physical Exam Vitals and nursing note reviewed.  Constitutional:      Appearance: Normal appearance.     Interventions: Face mask in place.  HENT:     Head: Normocephalic.     Right Ear: Tympanic membrane and ear canal normal.     Left Ear: Tympanic membrane and ear canal normal.     Nose:     Right Sinus: No frontal sinus tenderness.     Left Sinus: No frontal sinus tenderness.     Mouth/Throat:     Mouth: Mucous membranes are moist.     Pharynx: No pharyngeal swelling, oropharyngeal exudate, posterior oropharyngeal erythema or uvula swelling.     Tonsils: No tonsillar exudate or tonsillar abscesses.  Eyes:     Pupils: Pupils are equal, round, and reactive to light.  Cardiovascular:     Rate and Rhythm: Normal rate and regular rhythm.  Pulmonary:     Effort: Pulmonary effort is normal.     Breath sounds: Normal breath sounds.  Musculoskeletal:        General: Normal range of motion.     Cervical back: Normal range of motion.  Lymphadenopathy:     Head:     Right side of head: No preauricular or posterior auricular adenopathy.     Left side of head: No preauricular or posterior auricular  adenopathy.     Cervical: No cervical adenopathy.  Skin:    General: Skin is warm and dry.  Neurological:     Mental Status: She is alert.  Psychiatric:        Mood and Affect: Mood normal.        Behavior: Behavior normal.    Assessment and Plan   Health Maintenance counseling: 1. Anticipatory guidance: Patient counseled regarding regular dental exams q6 months, eye exams,  avoiding smoking and second hand smoke, limiting alcohol to 1 beverage per day, no illicit drugs.   2. Risk factor reduction:  Advised patient of need for regular exercise and diet rich with fruits and vegetables to reduce risk of heart attack and stroke. Exercise- mostly w/cleaning house and for a friend.  Wt Readings from Last 3 Encounters:  04/19/23 110 lb 9.6 oz (50.2 kg)  04/12/23 110 lb 3.2 oz (50 kg)  12/17/22 111 lb (50.3 kg)   3. Immunizations/screenings/ancillary studies Immunization History  Administered Date(s) Administered  . DTaP 01/26/2004  . Influenza Inj Mdck Quad Pf 10/29/2016, 10/17/2017, 10/20/2018  . Influenza,inj,Quad PF,6+ Mos 10/20/2018, 10/22/2019  . Influenza-Unspecified 10/22/2015, 10/29/2016, 10/17/2017, 10/20/2018, 10/23/2020, 11/05/2020, 10/29/2021  . PFIZER Comirnaty(Gray Top)Covid-19 Tri-Sucrose Vaccine 04/17/2019, 05/08/2019  . [employer] officer, [employer] 35yrs & up 01/01/2020  . Tdap 01/28/2014  . Zoster, Live 01/26/2007   Health Maintenance Due  Topic Date Due  . Zoster Vaccines- Shingrix (1 of 2) 05/23/1976  . COVID-19 Vaccine (4 - 2024-25 season) 09/26/2022    4. Cervical cancer screening-  done 2024 5. Breast cancer screening-  mammogram last done 2024 6. Colon cancer screening - done 2024 7. Skin cancer screening-  advised regular sunscreen use. Denies worrisome, changing, or new skin lesions.  8. Birth control/STD check- N/A postmenopausal  9. Osteoporosis screening- done 2022 10. Alcohol screening: 1 glass wine/night 11. Smoking associated screening  (lung cancer screening, AAA screen 65-75, UA)- non- smoker - hx of at least 66yr 1/2ppd.   Assessment & Plan Chronic Hip and Back Pain - with slight improvement from therapy and dry needling. MRI scheduled to investigate potential causes, possibly related to the back. - Proceed with MRI of hip and back. - Consider steroid injection based on MRI results.  Osteoarthritis - Joint pain and nodules in hands. Voltaren gel provides relief. Turmeric curcumin suggested as a natural anti-inflammatory alternative. - Consider turmeric curcumin as a natural anti-inflammatory. - Continue using Voltaren gel.  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 Shingrix vaccine confirmed safe with Valacyclovir. Vaccine is 98% effective with potential mild side effects. - Continue Valacyclovir 500 mg daily. - Receive Shingrix vaccine at a convenient time when she can rest if needed.  Osteoporosis - Risk factors include Caucasian ethnicity, thin body habitus, and smoking history. Hormone therapy may help bone density. Regular DEXA scans necessary. - Order DEXA scan to assess current bone density. - Continue vitamin D and calcium supplementation. - Encourage weight-bearing exercises.  Gastroesophageal Reflux Disease (GERD) - Managed with pantoprazole and occasional Pepcid. Intermittent upper abdominal pain possibly related to GERD or gas. Dietary modifications and increased water intake may help. - Continue pantoprazole daily. - Use Pepcid as needed. - Consider increasing water intake and dietary modifications to manage symptoms.  Constipation - Chronic constipation managed with MiraLAX. Hard, pasty stools and occasional difficulty with bowel movements. Adjusting MiraLAX dosage and increasing water intake may improve stool consistency. - Continue MiraLAX daily, adjust dose as needed for stool consistency. - Increase water intake to aid bowel movements.  General Health Maintenance - Up  to date on preventive screenings and vaccinations. Quit smoking in 2013. Discussed potential benefit of low-dose CT scan for lung cancer screening due to smoking history. - Consider low-dose CT scan for lung cancer screening. - Encourage regular exercise and healthy lifestyle choices.  Recommended follow up:  No follow-ups on file. Future Appointments  Date Time Provider Department Center  04/21/2023  2:15 PM Jerene Bears, MD [facility]    Lab/Order associations:  fasting   Dulce Sellar, NP

## 2023-04-19 NOTE — Patient Instructions (Addendum)
 It was very nice to see you today!   I will review your lab results via MyChart in a few days.  Go ahead and get the Shringix vaccine at your pharmacy, it is safe to get while still taking the Valacyclovir.   For your CONSTIPATION:  Continue taking MiraLAX daily and adjust the dose as needed to achieve the desired stool consistency 1/2 scoop to a full scoop daily or twice a day.  Increasing your water intake can also help (at least 2 liters daily) improve bowel movements.   Turmeric (Curcumin) with Bioperine (1500-2,000mg /day in divided doses) is an anti-inflammatory to help with arthritis pain/stiffness. Magnesium glycinate, L-threonate, or taurate can help with anxiety, maintaining sleep (if taken at night), muscle recovery, good bowel function, hot flashes, and maintaining blood pressure. For magnesium look for chelated form (better absorbability) and for any over the counter supplement or vitamin, look for organic or has a 3rd party seal from [employer] international, [employer] or [employer].  This authenticates the quality but not the efficacy (since not FDA approved).      PLEASE NOTE:  If you had any lab tests please let us know if you have not heard back within a few days. You may see your results on MyChart before we have a chance to review them but we will give you a call once they are reviewed by [place]. If we ordered any referrals today, please let us know if you have not heard from their office within the next week.

## 2023-04-20 DIAGNOSIS — K219 Gastro-esophageal reflux disease without esophagitis: Secondary | ICD-10-CM | POA: Insufficient documentation

## 2023-04-20 NOTE — Assessment & Plan Note (Signed)
 Chronic constipation managed with MiraLAX. Hard, pasty stools and occasional difficulty with bowel movements. Adjusting MiraLAX dosage and increasing water intake may improve stool consistency. - Continue MiraLAX daily, adjust dose as needed for stool consistency. - Increase water intake to aid bowel movements.

## 2023-04-20 NOTE — Assessment & Plan Note (Signed)
 Chronic HSV-2 managed with Valacyclovir. Shingrix vaccine confirmed safe with Valacyclovir and daily use of Valtrex does not protect against Shingles. Vaccine is 98% effective with potential mild side effects. - Continue Valacyclovir 500 mg daily. - Receive Shingrix vaccine at a convenient time when she can rest if needed.

## 2023-04-20 NOTE — Assessment & Plan Note (Signed)
 Managed with pantoprazole and occasional Pepcid. Intermittent upper abdominal pain possibly related to GERD or gas. Dietary modifications and increased water intake may help. - Continue pantoprazole daily. - Use Pepcid as needed. - Consider increasing water intake and dietary modifications to manage symptoms.

## 2023-04-20 NOTE — Assessment & Plan Note (Signed)
 with slight improvement from therapy and dry needling. MRI scheduled to investigate potential causes, possibly related to the back. - Proceed with MRI of hip and back. - Consider steroid injection based on MRI results.

## 2023-04-20 NOTE — Assessment & Plan Note (Signed)
 Risk factors include Caucasian ethnicity, thin body habitus, and smoking history. Hormone therapy may help bone density. Regular DEXA scans necessary. Fosamax suggested by previous PCP but not discussed/started. - Order DEXA scan to assess current bone density. - Continue vitamin D3 4,000units qd and calcium supplementation. - Encourage weight-bearing exercises.

## 2023-04-21 ENCOUNTER — Encounter: Payer: Self-pay | Admitting: Family

## 2023-04-21 ENCOUNTER — Ambulatory Visit (HOSPITAL_BASED_OUTPATIENT_CLINIC_OR_DEPARTMENT_OTHER): Payer: Medicare Other | Admitting: Obstetrics & Gynecology

## 2023-04-21 ENCOUNTER — Telehealth: Payer: Self-pay

## 2023-04-21 ENCOUNTER — Telehealth: Payer: Self-pay | Admitting: Family

## 2023-04-21 ENCOUNTER — Other Ambulatory Visit: Payer: Self-pay | Admitting: Family

## 2023-04-21 ENCOUNTER — Other Ambulatory Visit (HOSPITAL_COMMUNITY)
Admission: RE | Admit: 2023-04-21 | Discharge: 2023-04-21 | Disposition: A | Discharge status: Home / Self Care | Source: Ambulatory Visit | Attending: Obstetrics & Gynecology | Admitting: Obstetrics & Gynecology

## 2023-04-21 ENCOUNTER — Encounter (HOSPITAL_BASED_OUTPATIENT_CLINIC_OR_DEPARTMENT_OTHER): Payer: Self-pay | Admitting: Obstetrics & Gynecology

## 2023-04-21 VITALS — BP 123/61 | HR 64 | Ht 64.0 in | Wt 110.6 lb

## 2023-04-21 DIAGNOSIS — N871 Moderate cervical dysplasia: Secondary | ICD-10-CM | POA: Insufficient documentation

## 2023-04-21 DIAGNOSIS — Z9889 Other specified postprocedural states: Secondary | ICD-10-CM | POA: Insufficient documentation

## 2023-04-21 DIAGNOSIS — N390 Urinary tract infection, site not specified: Secondary | ICD-10-CM

## 2023-04-21 DIAGNOSIS — B009 Herpesviral infection, unspecified: Secondary | ICD-10-CM

## 2023-04-21 DIAGNOSIS — Z9189 Other specified personal risk factors, not elsewhere classified: Secondary | ICD-10-CM | POA: Diagnosis not present

## 2023-04-21 DIAGNOSIS — Z1151 Encounter for screening for human papillomavirus (HPV): Secondary | ICD-10-CM | POA: Diagnosis not present

## 2023-04-21 DIAGNOSIS — M810 Age-related osteoporosis without current pathological fracture: Secondary | ICD-10-CM

## 2023-04-21 DIAGNOSIS — Z124 Encounter for screening for malignant neoplasm of cervix: Secondary | ICD-10-CM | POA: Diagnosis not present

## 2023-04-21 DIAGNOSIS — Z79890 Hormone replacement therapy (postmenopausal): Secondary | ICD-10-CM

## 2023-04-21 DIAGNOSIS — Z01411 Encounter for gynecological examination (general) (routine) with abnormal findings: Secondary | ICD-10-CM | POA: Diagnosis present

## 2023-04-21 MED ORDER — VALACYCLOVIR HCL 500 MG PO TABS: 500.0000 mg | ORAL_TABLET | Freq: Every day | ORAL | 3 refills | Status: AC

## 2023-04-21 MED ORDER — ESTRADIOL 0.5 MG PO TABS: 0.2500 mg | ORAL_TABLET | ORAL | 3 refills | Status: AC

## 2023-04-21 MED ORDER — MEDROXYPROGESTERONE ACETATE 2.5 MG PO TABS: 2.5000 mg | ORAL_TABLET | Freq: Every day | ORAL | 12 refills | Status: AC

## 2023-04-21 MED ORDER — ESTRADIOL 0.1 MG/GM VA CREA: TOPICAL_CREAM | VAGINAL | 2 refills | Status: DC

## 2023-04-21 NOTE — Telephone Encounter (Signed)
 Discussed labs in result message.

## 2023-04-21 NOTE — Patient Instructions (Signed)
D-Mannose 2 grams daily

## 2023-04-21 NOTE — Telephone Encounter (Signed)
 Copied from CRM [id number]. Topic: Clinical - Lab/Test Results >> Apr 21, 2023  9:14 AM Pascal Lux wrote: Reason for CRM: Patient is requesting a call back for clarity on her labs.      Please review labs.   Pt does use MyChart.

## 2023-04-21 NOTE — Telephone Encounter (Signed)
 Copied from CRM [id number]. Topic: General - Other >> Apr 21, 2023 10:52 AM Gurney Maxin H wrote: Reason for CRM: Patient called in to advise office that [facility] will be sending over a request for a an authorization for her bone density test, states she already has a mammogram scheduled with them for October 14th and the can do the bone density test as well the same day, patient stated she didn't need to put in a referral request but wanted to notify office that Gar[facility] Braver would be sending over the authorization request.  Elice [phone]

## 2023-04-21 NOTE — Progress Notes (Signed)
 Breast and Pelvic Exam Patient name: Patricia Ryan MRN [id number]  Date of birth: 05-13-1955 Chief Complaint:   Breast and Pelvic Exam  History of Present Illness:   Patricia Ryan is a 68 y.o. G2P2 Caucasian female being seen today for a routine annual exam. H/o LEEP last February with CIN 1 and 2 on pathology.  Endocervical margins were negative.  Follow up pap smear 6 months was negative with negative HR HPV.  On HRT.  Doing well.    Last UTI was in January or February.    Denies vaginal bleeding.    Still having issues with her right hip.  Does have MRI scheduled for tomorrow.    Patient's last menstrual period was 01/20/2011.  Last pap 09/2022. H/o LEEP  02/2022. Last mammogram: 08/2022. Results were: normal.  Last colonoscopy: 08/2022. Results were: abnormal with adenomatous polyps . Family h/o colorectal cancer: no BMD:  scheduled 11/07/2023     04/21/2023    2:38 PM 10/21/2022    1:23 PM 08/13/2022   10:49 AM 04/08/2022   11:40 AM 03/25/2022    4:14 PM  Depression screen PHQ 2/9  Decreased Interest 0 0 0 0 0  Down, Depressed, Hopeless 0 0 0 0 0  PHQ - 2 Score 0 0 0 0 0    Review of Systems:   Pertinent items are noted in HPI  Denies any new urinary issues, new bowel issues, or new breast issues Pertinent History Reviewed:  Reviewed past medical,surgical, social and family history.   Reviewed problem list, medications and allergies. Physical Assessment:   Vitals:   04/21/23 1432  BP: 123/61  Pulse: 64  Weight: 110 lb 9.6 oz (50.2 kg)  Height: 5\' 4"  (1.626 m)  Body mass index is 18.98 kg/m.        Physical Examination:   General appearance - well appearing, and in no distress  Mental status - alert, oriented to person, place, and time  Psych:  She has a normal mood and affect  Skin - warm and dry, normal color, no suspicious lesions noted  Chest - effort normal, all lung fields clear to auscultation bilaterally  Heart - normal rate and regular  rhythm  Neck:  midline trachea, no thyromegaly or nodules  Breasts - breasts appear normal, no suspicious masses, no skin or nipple changes or  axillary nodes  Abdomen - soft, nontender, nondistended, no masses or organomegaly  Pelvic - VULVA: normal appearing vulva with no masses, tenderness or lesions    VAGINA: normal appearing vagina with normal color and discharge, no lesions    CERVIX: normal appearing cervix without discharge or lesions, no CMT  Thin prep pap is done with HR HPV cotesting  UTERUS: uterus is felt to be normal size, shape, consistency and nontender   ADNEXA: No adnexal masses or tenderness noted.  Rectal - normal rectal, good sphincter tone, no masses felt.  Extremities:  No swelling or varicosities noted  Chaperone present for exam, Hendricks Milo, CMA  Assessment & Plan:  1. GYN exam for high-risk Medicare patient (Primary) - Pap smear and HR HPV obtained otday - Mammogram 08/2022 - Colonoscopy 09/23/2022.  Follow up 7 years. - Bone mineral density scheduled already - lab work done with PCP, Judeth Cornfield Hudness - vaccines reviewed/updated  2. Cervical cancer screening - Cytology - PAP( [place])  3. History of loop electrical excision procedure (LEEP)  4. Dysplasia of cervix, high grade CIN 2  5. Age-related osteoporosis without  current pathological fracture - Dexa is scheduled  6. Hormone replacement therapy (HRT) - estradiol (ESTRACE) 0.5 MG tablet; Take 0.5 tablets (0.25 mg total) by mouth every other day.  Dispense: 45 tablet; Refill: 3 - medroxyPROGESTERone (PROVERA) 2.5 MG tablet; Take 1 tablet (2.5 mg total) by mouth daily.  Dispense: 30 tablet; Refill: 12  7. Recurrent UTI - estradiol (ESTRACE) 0.1 MG/GM vaginal cream; Use small amount externally nightly  Dispense: 42.5 g; Refill: 2  8. HSV-2 infection - valACYclovir (VALTREX) 500 MG tablet; Take 1 tablet (500 mg total) by mouth daily.  Dispense: 90 tablet; Refill: 3    Follow-up: No  follow-ups on file.  Jerene Bears, MD 04/21/2023 3:06 PM

## 2023-04-22 NOTE — Telephone Encounter (Signed)
 Noted.

## 2023-04-26 LAB — CYTOLOGY - PAP
Comment: NEGATIVE
High risk HPV: NEGATIVE

## 2023-04-27 ENCOUNTER — Encounter (HOSPITAL_BASED_OUTPATIENT_CLINIC_OR_DEPARTMENT_OTHER): Payer: Self-pay | Admitting: Obstetrics & Gynecology

## 2023-04-27 NOTE — Telephone Encounter (Signed)
 Called pt and set up appt for repeat PAP

## 2023-04-30 ENCOUNTER — Encounter (HOSPITAL_BASED_OUTPATIENT_CLINIC_OR_DEPARTMENT_OTHER): Payer: Self-pay | Admitting: Obstetrics & Gynecology

## 2023-05-05 ENCOUNTER — Other Ambulatory Visit: Payer: Self-pay | Admitting: Physician Assistant

## 2023-05-05 DIAGNOSIS — R1010 Upper abdominal pain, unspecified: Secondary | ICD-10-CM

## 2023-05-05 DIAGNOSIS — K219 Gastro-esophageal reflux disease without esophagitis: Secondary | ICD-10-CM

## 2023-05-13 ENCOUNTER — Ambulatory Visit
Admission: RE | Admit: 2023-05-13 | Discharge: 2023-05-13 | Disposition: A | Discharge status: Home / Self Care | Source: Ambulatory Visit | Attending: Family | Admitting: Family

## 2023-05-13 DIAGNOSIS — Z87891 Personal history of nicotine dependence: Secondary | ICD-10-CM

## 2023-05-23 ENCOUNTER — Encounter: Payer: Self-pay | Admitting: Family

## 2023-05-23 ENCOUNTER — Ambulatory Visit (INDEPENDENT_AMBULATORY_CARE_PROVIDER_SITE_OTHER): Admitting: Family

## 2023-05-23 VITALS — BP 126/78 | HR 53 | Temp 98.1°F | Wt 111.0 lb

## 2023-05-23 DIAGNOSIS — R300 Dysuria: Secondary | ICD-10-CM | POA: Diagnosis not present

## 2023-05-23 LAB — POCT URINALYSIS DIPSTICK
Bilirubin, UA: NEGATIVE
Blood, UA: NEGATIVE
Glucose, UA: NEGATIVE
Ketones, UA: NEGATIVE
Leukocytes, UA: NEGATIVE
Nitrite, UA: NEGATIVE
Protein, UA: NEGATIVE
Spec Grav, UA: 1.01 (ref 1.010–1.025)
Urobilinogen, UA: 0.2 U/dL
pH, UA: 6 (ref 5.0–8.0)

## 2023-05-23 MED ORDER — PHENAZOPYRIDINE HCL 100 MG PO TABS: 100.0000 mg | ORAL_TABLET | Freq: Three times a day (TID) | ORAL | 0 refills | Status: AC | PRN

## 2023-05-23 NOTE — Progress Notes (Signed)
 Patient ID: Patricia Ryan, female    DOB: 12-28-1955, 68 y.o.   MRN: [id number]  Chief Complaint  Patient presents with   Urinary Tract Infection    Frequent urination  Discussed the use of AI scribe software for clinical note transcription with the patient, who gave verbal consent to proceed.  History of Present Illness The patient, with a history of interstitial cystitis and a bladder sling, presents with increasing bladder discomfort and spasms. The discomfort is described as 'real crampy' and is located in the middle of the bladder, sometimes radiating up either side of the groin. She has a history of kidney stones, with the most recent being a 7mm stone that was blasted. She is currently taking oxybutynin  for the interstitial cystitis, but is experiencing dry mouth as a side effect. She has also tried amitriptyline  in the past, but discontinued due to grogginess from the medication. She has been using estradiol  cream and has recently tried over-the-counter Azo, which she believes may not be as effective as prescription Pyridium . She has a follow-up appointment with her urologist in June, but is considering asking for an earlier appointment due to the ongoing discomfort.  Assessment & Plan Interstitial cystitis Current dysuria, UA in office negative. Chronic interstitial cystitis with bladder discomfort, cramping, spasms, and dysuria.  Oxybutynin  efficacy and side effects are concerns. Pyridium  provides relief.  - Continue oxybutynin  for bladder spasms. - Prescribe Pyridium  for short term relief, up to three times daily for three days, allow splitting 100 mg tablet. - Consult urologist about oxybutynin  change if symptoms persist, considering cost and insurance for alternatives like Myrbetriq .  Bladder spasms Bladder spasms linked to interstitial cystitis. Oxybutynin  may be no longer effective. Evaluate bladder sling's role. She is hesitant about sling removal due to potential replacement  surgery. - Consult urologist sooner if possible than scheduled June f/u visit, about bladder sling, consider imaging to assess status. - Consider adjusting oxybutynin  dosage or switching medication if symptoms persist. - Encourage contacting urologist for earlier appointment due to symptoms.     Subjective:    Outpatient Medications Prior to Visit  Medication Sig Dispense Refill   ALPRAZolam  (XANAX ) 0.25 MG tablet TAKE A HALF TO 1 TABLET BY MOUTH AT BEDTIME ONLY AS NEEDED FOR SLEEP LIMIT USE TO AVOID ADDICTION AND DEMENTIA 18 tablet 2   ascorbic acid (VITAMIN C) 500 MG tablet Take 250 mg by mouth every other day.     bimatoprost (LATISSE) 0.03 % ophthalmic solution Apply 1 drop to eye at bedtime.     CALCIUM  CITRATE PO Take 1 tablet by mouth daily. 600 +D     Cholecalciferol (VITAMIN D ) 50 MCG (2000 UT) tablet Take 2,000 Units by mouth daily.     Cyanocobalamin (B-12 PO) Take 500 mcg by mouth daily.     diltiazem  (CARDIZEM ) 30 MG tablet Take 1 tab twice a day as needed for palpitations (Patient taking differently: Take 30 mg by mouth as needed. Take 1 tab twice a day as needed for palpitations) 30 tablet 1   estradiol  (ESTRACE ) 0.1 MG/GM vaginal cream Use small amount externally nightly 42.5 g 2   estradiol  (ESTRACE ) 0.5 MG tablet Take 0.5 tablets (0.25 mg total) by mouth every other day. 45 tablet 3   famotidine  (PEPCID ) 40 MG tablet Take 1 tablet (40 mg total) by mouth at bedtime. 90 tablet 3   hyoscyamine  (LEVSIN) 0.125 MG tablet Take 1 tablet (0.125 mg total) by mouth every 4 (four) hours as needed  for cramping (diarrhea, nausea). 50 tablet 2   medroxyPROGESTERone  (PROVERA ) 2.5 MG tablet Take 1 tablet (2.5 mg total) by mouth daily. 30 tablet 12   oxybutynin  (DITROPAN -XL) 10 MG 24 hr tablet Take 10 mg by mouth at bedtime.     pantoprazole  (PROTONIX ) 40 MG tablet TAKE 1 TABLET BY MOUTH 2 TIMES A DAY BEFORE A MEAL 60 tablet 6   polyethylene glycol powder (GLYCOLAX /MIRALAX ) 17 GM/SCOOP powder  as needed.     rosuvastatin  (CRESTOR ) 20 MG tablet Take  1 tablet  Daily  for Cholesterol 90 tablet 3   valACYclovir  (VALTREX ) 500 MG tablet Take 1 tablet (500 mg total) by mouth daily. 90 tablet 3   dicyclomine  (BENTYL ) 10 MG capsule Take 1 capsule (10 mg total) by mouth 4 (four) times daily -  before meals and at bedtime. (Patient taking differently: Take 10 mg by mouth as needed.) 90 capsule 1   No facility-administered medications prior to visit.   Past Medical History:  Diagnosis Date   Allergy    Altered bowel habits 09/07/2019   ANAL FISSURE, HX OF 09/24/2008   Qualifier: Diagnosis of  By: Felipe Horton CMA, Kelly     Anxiety    Anxiety state 09/24/2008   Qualifier: Diagnosis of   By: Felipe Horton CMA, Loetta Ringer         Arthritis    Atypical chest pain 09/07/2019   Cancer (HCC)    skin cancer   Constipation 02/06/2010   Qualifier: Diagnosis of   By: Felipe Horton CMA, Loetta Ringer         Fibroids    Uterine   GERD (gastroesophageal reflux disease)    Hepatitis    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   History of kidney stones    HSV-2 (herpes simplex virus 2) infection    Hyperlipidemia    IBS (irritable bowel syndrome)    Interstitial cystitis    Nevus    vulva nevus   Prediabetes    RUQ abdominal pain 09/07/2019   UTI (urinary tract infection) 05/23/2020   Past Surgical History:  Procedure Laterality Date   BLADDER REPAIR     BUNIONECTOMY     CHOLECYSTECTOMY     COLONOSCOPY     LAPAROSCOPY  1985/ 1990   due to infertility   LITHOTRIPSY     TONSILLECTOMY     TOTAL HIP ARTHROPLASTY Right 01/04/2022   Procedure: TOTAL HIP ARTHROPLASTY;  Surgeon: Murleen Arms, MD;  Location: [facility];  Service: Orthopedics;  Laterality: Right;   tubal reconstruction     tubalplasty  1985/ 1990    UPPER GASTROINTESTINAL ENDOSCOPY     VAGINAL DELIVERY     x2   Allergies  Allergen Reactions   Sulfa  Antibiotics Hives, Shortness Of Breath and Rash   Codeine Nausea And Vomiting   Doxycycline   Nausea Only   Macrobid  [Nitrofurantoin  Macrocrystal] Nausea Only      Objective:    Physical Exam Vitals and nursing note reviewed.  Constitutional:      Appearance: Normal appearance.  Cardiovascular:     Rate and Rhythm: Normal rate and regular rhythm.  Pulmonary:     Effort: Pulmonary effort is normal.     Breath sounds: Normal breath sounds.  Musculoskeletal:        General: Normal range of motion.  Skin:    General: Skin is warm and dry.  Neurological:     Mental Status: She is alert.  Psychiatric:  Mood and Affect: Mood normal.        Behavior: Behavior normal.    BP 126/78   Pulse (!) 53   Temp 98.1 F (36.7 C)   Wt 111 lb (50.3 kg)   LMP 01/20/2011   SpO2 97%   BMI 19.05 kg/m  Wt Readings from Last 3 Encounters:  05/23/23 111 lb (50.3 kg)  04/21/23 110 lb 9.6 oz (50.2 kg)  04/19/23 110 lb 9.6 oz (50.2 kg)      Versa Gore, NP

## 2023-06-10 ENCOUNTER — Ambulatory Visit: Payer: Self-pay | Admitting: Family

## 2023-06-10 ENCOUNTER — Encounter: Payer: Self-pay | Admitting: Family

## 2023-06-10 DIAGNOSIS — R918 Other nonspecific abnormal finding of lung field: Secondary | ICD-10-CM | POA: Insufficient documentation

## 2023-06-10 DIAGNOSIS — I700 Atherosclerosis of aorta: Secondary | ICD-10-CM | POA: Insufficient documentation

## 2023-06-10 DIAGNOSIS — J439 Emphysema, unspecified: Secondary | ICD-10-CM | POA: Insufficient documentation

## 2023-06-18 ENCOUNTER — Encounter: Payer: Self-pay | Admitting: Family

## 2023-06-18 DIAGNOSIS — R918 Other nonspecific abnormal finding of lung field: Secondary | ICD-10-CM

## 2023-07-11 ENCOUNTER — Other Ambulatory Visit: Payer: Self-pay | Admitting: Family

## 2023-07-11 DIAGNOSIS — E782 Mixed hyperlipidemia: Secondary | ICD-10-CM

## 2023-07-11 MED ORDER — ROSUVASTATIN CALCIUM 20 MG PO TABS: ORAL_TABLET | ORAL | 3 refills | Status: DC

## 2023-07-11 NOTE — Telephone Encounter (Signed)
 Copied from CRM [id number]. Topic: Clinical - Medication Refill >> Jul 11, 2023  2:45 PM Magdalene School wrote: Medication: rosuvastatin  (CRESTOR ) 20 MG tablet  Has the patient contacted their pharmacy? Yes (Agent: If no, request that the patient contact the pharmacy for the refill. If patient does not wish to contact the pharmacy document the reason why and proceed with request.) (Agent: If yes, when and what did the pharmacy advise?)  This is the patient's preferred pharmacy:  [facility] [id number] - [place], [place] - [address] [place] [address] Phone: [phone] Fax: [phone]  Is this the correct pharmacy for this prescription? Yes If no, delete pharmacy and type the correct one.   Has the prescription been filled recently? No  Is the patient out of the medication? No  Has the patient been seen for an appointment in the last year OR does the patient have an upcoming appointment? Yes  Can we respond through MyChart? No  Agent: Please be advised that Rx refills may take up to 3 business days. We ask that you follow-up with your pharmacy.

## 2023-07-25 ENCOUNTER — Ambulatory Visit (HOSPITAL_BASED_OUTPATIENT_CLINIC_OR_DEPARTMENT_OTHER): Admitting: Obstetrics & Gynecology

## 2023-07-25 ENCOUNTER — Other Ambulatory Visit (HOSPITAL_COMMUNITY)
Admission: RE | Admit: 2023-07-25 | Discharge: 2023-07-25 | Disposition: A | Discharge status: Home / Self Care | Source: Ambulatory Visit | Attending: Obstetrics & Gynecology | Admitting: Obstetrics & Gynecology

## 2023-07-25 VITALS — BP 125/51 | HR 67 | Wt 110.2 lb

## 2023-07-25 DIAGNOSIS — N871 Moderate cervical dysplasia: Secondary | ICD-10-CM

## 2023-07-25 DIAGNOSIS — N952 Postmenopausal atrophic vaginitis: Secondary | ICD-10-CM | POA: Diagnosis not present

## 2023-07-25 DIAGNOSIS — N390 Urinary tract infection, site not specified: Secondary | ICD-10-CM | POA: Diagnosis not present

## 2023-07-25 DIAGNOSIS — R002 Palpitations: Secondary | ICD-10-CM | POA: Insufficient documentation

## 2023-07-25 NOTE — Progress Notes (Unsigned)
 GYNECOLOGY  VISIT  CC:   repeat pap, recurrent UTI  HPI: 68 y.o. G2P2 Married White or Caucasian female here for follow up pap smear.  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  She has not had any vaginal bleeding.  She has been using vaginal estrogen cream.  Her last pap smear showed atrophic pattern and epithelia atypia.  HR HPV was negative with this pap smear 04/21/2023.    Having recurrent UTIs.  Seeing urology at [facility].  Going to start methenamine BID after completion of current antibiotic regimen.  She did have a ultrasound showing mild left hydronephrosis.  She has a known renal sinus cyst.  CT has been ordered.  Has some questions/concerns about this.   Past Medical History:  Diagnosis Date   Allergy    Altered bowel habits 09/07/2019   ANAL FISSURE, HX OF 09/24/2008   Qualifier: Diagnosis of  By: Claudene CMA, Kelly     Anxiety    Anxiety state 09/24/2008   Qualifier: Diagnosis of   By: Claudene CMA, Burnard         Arthritis    Atypical chest pain 09/07/2019   Cancer (HCC)    skin cancer   Constipation 02/06/2010   Qualifier: Diagnosis of   By: Claudene CMA, Burnard         Fibroids    Uterine   GERD (gastroesophageal reflux disease)    Hepatitis    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   History of kidney stones    HSV-2 (herpes simplex virus 2) infection    Hyperlipidemia    IBS (irritable bowel syndrome)    Interstitial cystitis    Nevus    vulva nevus   Prediabetes    RUQ abdominal pain 09/07/2019   UTI (urinary tract infection) 05/23/2020    MEDS:   Current Outpatient Medications on File Prior to Visit  Medication Sig Dispense Refill   ALPRAZolam  (XANAX ) 0.25 MG tablet TAKE A HALF TO 1 TABLET BY MOUTH AT BEDTIME ONLY AS NEEDED FOR SLEEP LIMIT USE TO AVOID ADDICTION AND DEMENTIA 18 tablet 2   ascorbic acid (VITAMIN C) 500 MG tablet Take 250 mg by mouth every other day.     bimatoprost (LATISSE) 0.03 % ophthalmic solution Apply 1 drop to eye at bedtime.     CALCIUM   CITRATE PO Take 1 tablet by mouth daily. 600 +D     cephALEXin  (KEFLEX ) 500 MG capsule Take 500 mg by mouth 2 (two) times daily at 10 AM and 5 PM.     Cholecalciferol (VITAMIN D ) 50 MCG (2000 UT) tablet Take 2,000 Units by mouth daily.     Cyanocobalamin (B-12 PO) Take 500 mcg by mouth daily.     diltiazem  (CARDIZEM ) 30 MG tablet Take 1 tab twice a day as needed for palpitations 30 tablet 1   estradiol  (ESTRACE ) 0.1 MG/GM vaginal cream Use small amount externally nightly 42.5 g 2   estradiol  (ESTRACE ) 0.5 MG tablet Take 0.5 tablets (0.25 mg total) by mouth every other day. 45 tablet 3   famotidine  (PEPCID ) 40 MG tablet Take 1 tablet (40 mg total) by mouth at bedtime. 90 tablet 3   hyoscyamine  (LEVSIN ) 0.125 MG tablet Take 1 tablet (0.125 mg total) by mouth every 4 (four) hours as needed for cramping (diarrhea, nausea). 50 tablet 2   medroxyPROGESTERone  (PROVERA ) 2.5 MG tablet Take 1 tablet (2.5 mg total) by mouth daily. 30 tablet 12   methenamine (HIPREX)  1 g tablet Take 1 g by mouth 2 (two) times daily with a meal.     oxybutynin  (DITROPAN -XL) 10 MG 24 hr tablet Take 10 mg by mouth at bedtime.     pantoprazole  (PROTONIX ) 40 MG tablet TAKE 1 TABLET BY MOUTH 2 TIMES A DAY BEFORE A MEAL 60 tablet 6   phenazopyridine  (PYRIDIUM ) 100 MG tablet Take 1 tablet (100 mg total) by mouth 3 (three) times daily as needed for pain (Take for 3 days only.). OK to take 1/2 tablet. 30 tablet 0   polyethylene glycol powder (GLYCOLAX /MIRALAX ) 17 GM/SCOOP powder as needed.     rosuvastatin  (CRESTOR ) 20 MG tablet Take  1 tablet  Daily  for Cholesterol 90 tablet 3   valACYclovir  (VALTREX ) 500 MG tablet Take 1 tablet (500 mg total) by mouth daily. 90 tablet 3   dicyclomine  (BENTYL ) 10 MG capsule Take 1 capsule (10 mg total) by mouth 4 (four) times daily -  before meals and at bedtime. (Patient taking differently: Take 10 mg by mouth as needed.) 90 capsule 1   No current facility-administered medications on file prior to  visit.    ALLERGIES: Sulfa  antibiotics, Codeine, Doxycycline , and Macrobid  [nitrofurantoin  macrocrystal]  SH:  married, non some  Review of Systems  Constitutional: Negative.   Genitourinary: Negative.     PHYSICAL EXAMINATION:    BP (!) 125/51 (BP Location: Left Arm, Patient Position: Sitting, Cuff Size: Large)   Pulse 67   Wt 110 lb 3.2 oz (50 kg)   LMP 01/20/2011   SpO2 97%   BMI 18.92 kg/m     General appearance: alert, cooperative and appears stated age  Lymph:  no inguinal LAD noted Pelvic: External genitalia:  no lesions              Urethra:  normal appearing urethra with no masses, tenderness or lesions              Bartholins and Skenes: normal                 Vagina: normal mucosa without prolapse or lesions              Cervix: no lesions              Pap obtained today  Chaperone was present for exam.  Assessment/Plan: 1. Dysplasia of cervix, high grade CIN 2 (Primary) - repeat pap smear obtained today.  If normal will repeat at [facility] 03/27/2024. - Cytology - PAP( [place])  2. Vaginal atrophy - can stop vaginal estrogen cream at this point.    3. Recurrent UTI - answered pt's questions about hydronephrosis and renal cyst

## 2023-07-25 NOTE — Progress Notes (Deleted)
   ANNUAL EXAM Patient name: Patricia Ryan MRN [id number]  Date of birth: 1956-01-15 Chief Complaint:   No chief complaint on file.  History of Present Illness:   Patricia Ryan is a 68 y.o. G2P2 Caucasian female being seen today for a routine annual exam.  Current complaints: ***  Patient's last menstrual period was 01/20/2011.   The pregnancy intention screening data noted above was reviewed. Potential methods of contraception were discussed. The patient elected to proceed with No data recorded.   Last pap 04/21/23. Results were: Atrophic patterns with epithelial atypia. H/O abnormal pap: yes Last mammogram: 09/21/2022. Results were: {normal, abnormal, n/a:23837}. Family h/o breast cancer: {yes***/no:23838} Last colonoscopy: 09/23/2022. Results were: {normal, abnormal, n/a:23837}. Family h/o colorectal cancer: {yes***/no:23838}     04/21/2023    2:38 PM 10/21/2022    1:23 PM 08/13/2022   10:49 AM 04/08/2022   11:40 AM 03/25/2022    4:14 PM  Depression screen PHQ 2/9  Decreased Interest 0 0 0 0 0  Down, Depressed, Hopeless 0 0 0 0 0  PHQ - 2 Score 0 0 0 0 0         No data to display           Review of Systems:   Pertinent items are noted in HPI Denies any headaches, blurred vision, fatigue, shortness of breath, chest pain, abdominal pain, abnormal vaginal discharge/itching/odor/irritation, problems with periods, bowel movements, urination, or intercourse unless otherwise stated above. Pertinent History Reviewed:  Reviewed past medical,surgical, social and family history.  Reviewed problem list, medications and allergies. Physical Assessment:  There were no vitals filed for this visit.There is no height or weight on file to calculate BMI.        Physical Examination:   General appearance - well appearing, and in no distress  Mental status - alert, oriented to person, place, and time  Psych:  She has a normal mood and affect  Skin - warm and dry, normal color, no  suspicious lesions noted  Chest - effort normal, all lung fields clear to auscultation bilaterally  Heart - normal rate and regular rhythm  Neck:  midline trachea, no thyromegaly or nodules  Breasts - breasts appear normal, no suspicious masses, no skin or nipple changes or  axillary nodes  Abdomen - soft, nontender, nondistended, no masses or organomegaly  Pelvic - VULVA: normal appearing vulva with no masses, tenderness or lesions  VAGINA: normal appearing vagina with normal color and discharge, no lesions  CERVIX: normal appearing cervix without discharge or lesions, no CMT  Thin prep pap is {Desc; done/not:10129} *** HR HPV cotesting  UTERUS: uterus is felt to be normal size, shape, consistency and nontender   ADNEXA: No adnexal masses or tenderness noted.  Rectal - normal rectal, good sphincter tone, no masses felt. Hemoccult: ***  Extremities:  No swelling or varicosities noted  Chaperone present for exam  No results found for this or any previous visit (from the past 24 hours).  Assessment & Plan:  1) Well-Woman Exam  2) ***  Labs/procedures today: ***  Mammogram: {Mammo f/u:25212::@ 68yo}, or sooner if problems Colonoscopy: {TCS f/u:25213::@ 68yo}, or sooner if problems  No orders of the defined types were placed in this encounter.   Meds: No orders of the defined types were placed in this encounter.   Follow-up: No follow-ups on file.  Rolande JONELLE Edison, CMA 07/25/2023 2:27 PM

## 2023-07-25 NOTE — Progress Notes (Unsigned)
  Cardiology Office Note:   Date:  07/26/2023  ID:  Tilton VEAR Cable, DOB 1955/07/10, MRN [id number] PCP: Lucius Krabbe, NP  [facility] Providers Cardiologist:  None {  History of Present Illness:   Patricia Ryan is a 68 y.o. female who presents for evaluation of palpitations.  She was seen by Dr. Hobart in the past.  She had rare ectopy on a monitor in July 2024.  Echo was unremarkable.    She did have a screening CT for lung cancer ordered by her primary provider and was noted to have some aortic atherosclerosis.  She has never however had coronary calcium  noted on this.  She does still occasionally get some palpitations and may or may not get some help with the Cardizem .  She takes it rarely. The patient denies any new symptoms such as chest discomfort, neck or arm discomfort. There has been no new shortness of breath, PND or orthopnea. There have been no reported palpitations, presyncope or syncope.    ROS: As stated in the HPI and negative for all other systems.  Studies Reviewed:    EKG:   EKG Interpretation Date/Time:  Tuesday July 26 2023 15:29:27 EDT Ventricular Rate:  52 PR Interval:  168 QRS Duration:  84 QT Interval:  406 QTC Calculation: 377 R Axis:   66  Text Interpretation: Sinus bradycardia When compared with ECG of 06-Feb-2009 07:23, No significant change was found Confirmed by Lavona Agent ([id number]) on 07/26/2023 3:46:50 PM     Risk Assessment/Calculations:              Physical Exam:   VS:  BP 126/74   Pulse (!) 53   Ht 5' 3 (1.6 m)   Wt 110 lb (49.9 kg)   LMP 01/20/2011   SpO2 98%   BMI 19.49 kg/m    Wt Readings from Last 3 Encounters:  07/26/23 110 lb (49.9 kg)  07/25/23 110 lb 3.2 oz (50 kg)  05/23/23 111 lb (50.3 kg)     GEN: Well nourished, well developed in no acute distress NECK: No JVD; No carotid bruits CARDIAC: RRR, no murmurs, rubs, gallops RESPIRATORY:  Clear to auscultation without rales, wheezing or rhonchi   ABDOMEN: Soft, non-tender, non-distended EXTREMITIES:  No edema; No deformity   ASSESSMENT AND PLAN:   Palpitations: The palpitations are infrequent and not particularly symptomatic but she would like to have hearing Cardizem .  No further testing.    Dyslipidemia: I would suggest an LDL target in the 70s given the coronary calcium  and she and I discussed this.  I am going to increase her Crestor  to 40 mg daily and check a lipid profile in 3 months.   Follow up as needed.   Signed, Agent Lavona, MD

## 2023-07-26 ENCOUNTER — Ambulatory Visit: Attending: Cardiology | Admitting: Cardiology

## 2023-07-26 ENCOUNTER — Encounter: Payer: Self-pay | Admitting: Cardiology

## 2023-07-26 VITALS — BP 126/74 | HR 53 | Ht 63.0 in | Wt 110.0 lb

## 2023-07-26 DIAGNOSIS — E785 Hyperlipidemia, unspecified: Secondary | ICD-10-CM | POA: Diagnosis not present

## 2023-07-26 DIAGNOSIS — R002 Palpitations: Secondary | ICD-10-CM | POA: Diagnosis not present

## 2023-07-26 MED ORDER — DILTIAZEM HCL 30 MG PO TABS: ORAL_TABLET | ORAL | 3 refills | Status: AC

## 2023-07-26 MED ORDER — ROSUVASTATIN CALCIUM 40 MG PO TABS: 40.0000 mg | ORAL_TABLET | Freq: Every day | ORAL | 3 refills | Status: DC

## 2023-07-26 NOTE — Patient Instructions (Addendum)
 Medication Instructions:  Increase Crestor  to 40 mg once daily *If you need a refill on your cardiac medications before your next appointment, please call your pharmacy*  Lab Work: Fasting lipid panel in 3 months If you have labs (blood work) drawn today and your tests are completely normal, you will receive your results only by: MyChart Message (if you have MyChart) OR A paper copy in the mail If you have any lab test that is abnormal or we need to change your treatment, we will call you to review the results.  Testing/Procedures: NONE  Follow-Up: At [facility], you and your health needs are our priority.  As part of our continuing mission to provide you with exceptional heart care, our providers are all part of one team.  This team includes your primary Cardiologist (physician) and Advanced Practice Providers or APPs (Physician Assistants and Nurse Practitioners) who all work together to provide you with the care you need, when you need it.  Your next appointment:   As needed  Provider:   Lavona, MD  We recommend signing up for the patient portal called MyChart.  Sign up information is provided on this After Visit Summary.  MyChart is used to connect with patients for Virtual Visits (Telemedicine).  Patients are able to view lab/test results, encounter notes, upcoming appointments, etc.  Non-urgent messages can be sent to your provider as well.   To learn more about what you can do with MyChart, go to [email or web address].

## 2023-07-28 ENCOUNTER — Encounter (HOSPITAL_BASED_OUTPATIENT_CLINIC_OR_DEPARTMENT_OTHER): Payer: Self-pay | Admitting: Obstetrics & Gynecology

## 2023-07-28 ENCOUNTER — Ambulatory Visit (HOSPITAL_BASED_OUTPATIENT_CLINIC_OR_DEPARTMENT_OTHER): Payer: Self-pay | Admitting: Obstetrics & Gynecology

## 2023-07-28 LAB — CYTOLOGY - PAP: Diagnosis: NEGATIVE

## 2023-08-05 ENCOUNTER — Ambulatory Visit (INDEPENDENT_AMBULATORY_CARE_PROVIDER_SITE_OTHER): Admitting: Acute Care

## 2023-08-05 ENCOUNTER — Encounter: Payer: Self-pay | Admitting: Acute Care

## 2023-08-05 VITALS — BP 110/68 | HR 48 | Ht 63.0 in | Wt 110.6 lb

## 2023-08-05 DIAGNOSIS — R918 Other nonspecific abnormal finding of lung field: Secondary | ICD-10-CM

## 2023-08-05 DIAGNOSIS — Z87891 Personal history of nicotine dependence: Secondary | ICD-10-CM

## 2023-08-05 NOTE — Patient Instructions (Signed)
 It is good to see you today. We have reviewed the CT scan that you had done in April 2025. You have 2 very small pulmonary nodules that do not appear to be concerning. I understand with your smoking history that you would prefer to take another look at these nodules in 6 months from the original scan to ensure that they are stable and not concerning. I have placed an order for a CT scan that will be due in October 2025, this is 6 months after the scan that was done in April 2025. You will follow-up with us  in the office after the scan to review the results. Based on the results we will determine follow-up imaging. Please call if you decide you would like to have pulmonary function testing done to evaluate pulmonary function. Call for any unexplained weight loss or blood in your sputum and we will get you in to be seen sooner. We will see you in October after a follow-up scan. Please contact office for sooner follow up if symptoms do not improve or worsen or seek emergency care

## 2023-08-05 NOTE — Progress Notes (Signed)
 History of Present Illness Patricia Ryan is a 68 y.o. female former smoker referred for evaluation of 2 small lung nodules by her PCP. She will be followed by Dr. Shelah.  Pt. Has consented to use of [employer] soft wear to help capture the content of this OV    08/05/2023 Patricia Ryan is a 68 year old female who presents for follow-up of lung nodules found on a lung cancer screening scan. She was referred by her primary care provider, Patricia Ryan, for lung cancer screening due to her smoking history.  She underwent a lung cancer screening scan which revealed two very small nodules, measuring 3.6 mm and 2.9 mm, respectively. The scan was read as Lung-RADS 2, which is considered a benign category. She is concerned about these nodules due to her smoking history and family history of cancer. No chest pain or palpitations. She occasionally experiences shortness of breath, especially when climbing stairs.  She has a history of smoking but quit 12 years ago. She has mild emphysema, which was discovered during the lung cancer screening. She occasionally experiences shortness of breath, especially when climbing stairs, but remains active, cleaning a house weekly and staying busy at home. She has not had pulmonary function testing.  She has experienced weight fluctuations over the past few years, initially losing 10 pounds from a weight of 115 pounds over a couple of years, but has regained 5 pounds in the last year. She attributes some of this to dietary habits.  Her past medical history includes elevated cholesterol, a hip replacement in 2023 with ongoing pain, and a gallbladder removal in 1992. She also has a history of avascular necrosis, which she attributes to frequent prednisone  use in the past for respiratory issues.  I have explained that the pulmonary nodules noted on the low-dose screening CT are very small.  However with her history of tobacco abuse I understand her wanting to be extremely  cautious.  We will do a follow-up CT chest in October which will be a 60-month follow-up to the April 2025 low-dose screening CT.  That way we can reevaluate these  nodules at a shorter Interval to assess for stability to give her some reassurance.  I have asked the patient to call if she develops any weight loss that is unintentional and if she develops any blood in her sputum.  Likewise have asked her to call to be seen if she experiences any worsening shortness of breath.  I have offered her the option of pulmonary function testing to see if she has COPD and to evaluate her for maintenance inhaler use.  Test Results: LDCT Chest 05/13/2023 read 06/10/2023 Mediastinum/Nodes: No significant thyroid  nodules. Unremarkable esophagus. No pathologically enlarged axillary, mediastinal or hilar lymph nodes, noting limited sensitivity for the detection of hilar adenopathy on this noncontrast study.   Lungs/Pleura: No pneumothorax. No pleural effusion. Mild centrilobular emphysema with diffuse bronchial wall thickening. No acute consolidative airspace disease or lung masses. Two scattered tiny solid pulmonary nodules, largest 3 mm in the superior segment left lower lobe on series 3/image 64.  IMPRESSION: Lung-RADS 2, benign appearance or behavior. Continue annual screening with low-dose chest CT without contrast in 12 months.      Latest Ref Rng & Units 04/19/2023   11:40 AM 08/12/2022    9:53 AM 05/25/2022    4:41 PM  CBC  WBC 4.0 - 10.5 K/uL 5.2  5.1  7.1   Hemoglobin 12.0 - 15.0 g/dL 85.9  85.6  14.5   Hematocrit 36.0 - 46.0 % 41.6  43.0  44.0   Platelets 150.0 - 400.0 K/uL 256.0  268.0  256        Latest Ref Rng & Units 04/19/2023   11:40 AM 08/12/2022    9:53 AM 06/07/2022   11:48 AM  BMP  Glucose 70 - 99 mg/dL 96  893  899   BUN 6 - 23 mg/dL 13  13  11    Creatinine 0.40 - 1.20 mg/dL 9.46  9.28  9.35   BUN/Creat Ratio 6 - 22 (calc)   SEE NOTE:   Sodium 135 - 145 mEq/L 142  142  143    Potassium 3.5 - 5.1 mEq/L 3.8  4.6  5.0   Chloride 96 - 112 mEq/L 104  103  103   CO2 19 - 32 mEq/L 31  31  30    Calcium  8.4 - 10.5 mg/dL 89.9  89.7  89.8     BNP No results found for: BNP  ProBNP No results found for: PROBNP  PFT No results found for: FEV1PRE, FEV1POST, FVCPRE, FVCPOST, TLC, DLCOUNC, PREFEV1FVCRT, PSTFEV1FVCRT  No results found.   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   History of kidney stones    HSV-2 (herpes simplex virus 2) infection    Hyperlipidemia    IBS (irritable bowel syndrome)    Interstitial cystitis    Nevus    vulva nevus   Prediabetes    RUQ abdominal pain 09/07/2019   UTI (urinary tract infection) 05/23/2020     Social History   Tobacco Use   Smoking status: Former    Current packs/day: 0.00    Types: Cigarettes    Quit date: 12/26/2011    Years since quitting: 11.6    Passive exposure: Past   Smokeless tobacco: Never  Vaping Use   Vaping status: Never Used  Substance Use Topics   Alcohol  use: Yes    Alcohol /week: 5.0 standard drinks of alcohol     Types: 5 Glasses of wine per week    Comment: socially 2 times per week   Drug use: No    [name] reports that she quit smoking about 11 years ago. Her smoking use included cigarettes. She has been exposed to tobacco smoke. She has never used smokeless tobacco. She reports current alcohol  use of about 5.0 standard drinks of alcohol  per  week. She reports that she does not use drugs.  Tobacco Cessation: Counseling given: Not Answered Former smoker quit 11 years ago  Past surgical hx, Family hx, Social hx all reviewed.  Current Outpatient Medications on File Prior to Visit  Medication Sig   ALPRAZolam  (XANAX ) 0.25 MG tablet TAKE A HALF TO 1 TABLET BY MOUTH AT BEDTIME ONLY AS NEEDED FOR  SLEEP LIMIT USE TO AVOID ADDICTION AND DEMENTIA   ascorbic acid (VITAMIN C) 500 MG tablet Take 250 mg by mouth every other day.   bimatoprost (LATISSE) 0.03 % ophthalmic solution Apply 1 drop to eye at bedtime.   CALCIUM  CITRATE PO Take 1 tablet by mouth daily. 600 +D   cephALEXin  (KEFLEX ) 500 MG capsule Take 500 mg by mouth 2 (two) times daily at 10 AM and 5 PM.   Cholecalciferol (VITAMIN D ) 50 MCG (2000 UT) tablet Take 2,000 Units by mouth daily.   Cyanocobalamin (B-12 PO) Take 500 mcg by mouth daily.   diltiazem  (CARDIZEM ) 30 MG tablet Take 1 tab twice a day as needed for palpitations   estradiol  (ESTRACE ) 0.1 MG/GM vaginal cream Use small amount externally nightly   estradiol  (ESTRACE ) 0.5 MG tablet Take 0.5 tablets (0.25 mg total) by mouth every other day.   famotidine  (PEPCID ) 40 MG tablet Take 1 tablet (40 mg total) by mouth at bedtime.   hyoscyamine  (LEVSIN ) 0.125 MG tablet Take 1 tablet (0.125 mg total) by mouth every 4 (four) hours as needed for cramping (diarrhea, nausea).   medroxyPROGESTERone  (PROVERA ) 2.5 MG tablet Take 1 tablet (2.5 mg total) by mouth daily.   methenamine (HIPREX) 1 g tablet Take 1 g by mouth 2 (two) times daily with a meal.   oxybutynin  (DITROPAN -XL) 10 MG 24 hr tablet Take 10 mg by mouth at bedtime.   pantoprazole  (PROTONIX ) 40 MG tablet TAKE 1 TABLET BY MOUTH 2 TIMES A DAY BEFORE A MEAL   phenazopyridine  (PYRIDIUM ) 100 MG tablet Take 1 tablet (100 mg total) by mouth 3 (three) times daily as needed for pain (Take for 3 days only.). OK to take 1/2 tablet.   polyethylene glycol powder (GLYCOLAX /MIRALAX ) 17  GM/SCOOP powder as needed.   rosuvastatin  (CRESTOR ) 40 MG tablet Take 1 tablet (40 mg total) by mouth daily.   valACYclovir  (VALTREX ) 500 MG tablet Take 1 tablet (500 mg total) by mouth daily.   No current facility-administered medications on file prior to visit.     Allergies  Allergen Reactions   Sulfa  Antibiotics Hives, Shortness Of Breath and Rash   Codeine Nausea And Vomiting   Doxycycline  Nausea Only   Macrobid  [Nitrofurantoin  Macrocrystal] Nausea Only    Review Of Systems:  Constitutional:   No  weight loss, night sweats,  Fevers, chills, fatigue, or  lassitude.  HEENT:   No headaches,  Difficulty swallowing,  Tooth/dental problems, or  Sore throat,                No sneezing, itching, ear ache, nasal congestion, post nasal drip,   CV:  No chest pain,  Orthopnea, PND, swelling in lower extremities, anasarca, dizziness, palpitations, syncope.   GI  No heartburn, indigestion, abdominal pain, nausea, vomiting, diarrhea, change in bowel habits, loss of appetite, bloody stools.   Resp: + Occasional shortness of breath with exertion NONE at rest.  No excess mucus, no productive cough,  No non-productive cough,  No coughing up of blood.  No change in color of mucus.  No wheezing.  No chest wall deformity  Skin: no rash or lesions.  GU: no dysuria, change in color of urine, no urgency or frequency.  No flank pain, no hematuria   MS:  No joint pain or swelling.  No decreased range of motion.  No back pain.  Psych:  No change in mood or affect. No depression or anxiety.  No memory loss.   Vital Signs BP  110/68 (BP Location: Right Arm, Patient Position: Sitting, Cuff Size: Normal)   Pulse (!) 48   Ht 5' 3 (1.6 m)   Wt 110 lb 9.6 oz (50.2 kg)   LMP 01/20/2011   SpO2 96%   BMI 19.59 kg/m    Physical Exam:  General- No distress,  A&Ox3, pleasant ENT: No sinus tenderness, TM clear, pale nasal mucosa, no oral exudate,no post nasal drip, no LAN Cardiac: S1, S2, regular rate  and rhythm, no murmur Chest: No wheeze/ rales/ dullness; no accessory muscle use, no nasal flaring, no sternal retractions Abd.: Soft Non-tender, nondistended, bowel sounds positive,Body mass index is 19.59 kg/m.  Ext: No clubbing cyanosis, edema, no obvious deformities Neuro:  normal strength, moving all extremities x 4, alert and oriented x 3, does endorse pain at her hip replacement area which is limiting her mobility Skin: No rashes, warm and dry, no obvious skin lesions Psych: normal mood and behavior   Assessment/Plan Mild emphysema Mild emphysema noted on screening with occasional exertional dyspnea. - Offer pulmonary function testing if dyspnea worsens.  Lung nodules Two small lung nodules, benign appearance per Lung RADS 2. Smoking and family cancer history noted. Discussed early detection importance. - Order CT chest without contrast in October for six-month follow-up TO REASSESS NODULES. - Schedule follow-up visit to discuss CT results.   I spent 30 minutes dedicated to the care of this patient on the date of this encounter to include pre-visit review of records, face-to-face time with the patient discussing conditions above, post visit ordering of testing, clinical documentation with the electronic health record, making appropriate referrals as documented, and communicating necessary information to the patient's healthcare team.   Lauraine JULIANNA Lites, NP 08/05/2023  11:15 AM

## 2023-08-17 ENCOUNTER — Ambulatory Visit: Payer: Medicare Other | Admitting: Nurse Practitioner

## 2023-08-31 ENCOUNTER — Ambulatory Visit

## 2023-08-31 ENCOUNTER — Encounter: Payer: Self-pay | Admitting: Family

## 2023-08-31 ENCOUNTER — Other Ambulatory Visit: Payer: Self-pay | Admitting: Family

## 2023-08-31 VITALS — Ht 63.0 in | Wt 107.8 lb

## 2023-08-31 DIAGNOSIS — G4700 Insomnia, unspecified: Secondary | ICD-10-CM

## 2023-08-31 DIAGNOSIS — Z0000 Encounter for general adult medical examination without abnormal findings: Secondary | ICD-10-CM | POA: Diagnosis not present

## 2023-08-31 NOTE — Progress Notes (Signed)
 Subjective:   Patricia Ryan is a 68 y.o. who presents for a Medicare Wellness preventive visit.  As a reminder, Annual Wellness Visits don't include a physical exam, and some assessments may be limited, especially if this visit is performed virtually. We may recommend an in-person follow-up visit with your provider if needed.  Visit Complete: Virtual I connected with  Tilton VEAR Treiber on 08/31/23 by a audio enabled telemedicine application and verified that I am speaking with the correct person using two identifiers.  Patient Location: Home  Provider Location: Home Office  I discussed the limitations of evaluation and management by telemedicine. The patient expressed understanding and agreed to proceed.  Vital Signs: Because this visit was a virtual/telehealth visit, some criteria may be missing or patient reported. Any vitals not documented were not able to be obtained and vitals that have been documented are patient reported.  VideoDeclined- This patient declined Librarian, academic. Therefore the visit was completed with audio only.  Persons Participating in Visit: Patient.  AWV Questionnaire: Yes: Patient Medicare AWV questionnaire was completed by the patient on 08/27/23; I have confirmed that all information answered by patient is correct and no changes since this date.  Cardiac Risk Factors include: advanced age (>41men, >44 women);dyslipidemia     Objective:    Today's Vitals   08/31/23 1505  Weight: 107 lb 12.8 oz (48.9 kg)  Height: 5' 3 (1.6 m)   Body mass index is 19.1 kg/m.     08/31/2023    3:15 PM 08/13/2022   10:49 AM 12/23/2021    1:53 PM  Advanced Directives  Does Patient Have a Medical Advance Directive? Yes Yes No  Type of Estate agent of [place];Living will Living will;Healthcare Power of Attorney   Does patient want to make changes to medical advance directive?  No - Patient declined   Copy of  Healthcare Power of Attorney in Chart? No - copy requested No - copy requested     Current Medications (verified) Outpatient Encounter Medications as of 08/31/2023  Medication Sig   ALPRAZolam  (XANAX ) 0.25 MG tablet TAKE A HALF TO 1 TABLET BY MOUTH AT BEDTIME ONLY AS NEEDED FOR SLEEP LIMIT USE TO AVOID ADDICTION AND DEMENTIA   ascorbic acid (VITAMIN C) 500 MG tablet Take 250 mg by mouth every other day.   bimatoprost (LATISSE) 0.03 % ophthalmic solution Apply 1 drop to eye at bedtime.   CALCIUM  CITRATE PO Take 1 tablet by mouth daily. 600 +D   Cholecalciferol (VITAMIN D ) 50 MCG (2000 UT) tablet Take 2,000 Units by mouth daily.   Cyanocobalamin (B-12 PO) Take 500 mcg by mouth daily.   diltiazem  (CARDIZEM ) 30 MG tablet Take 1 tab twice a day as needed for palpitations   estradiol  (ESTRACE ) 0.5 MG tablet Take 0.5 tablets (0.25 mg total) by mouth every other day.   famotidine  (PEPCID ) 40 MG tablet Take 1 tablet (40 mg total) by mouth at bedtime.   hyoscyamine  (LEVSIN ) 0.125 MG tablet Take 1 tablet (0.125 mg total) by mouth every 4 (four) hours as needed for cramping (diarrhea, nausea).   medroxyPROGESTERone  (PROVERA ) 2.5 MG tablet Take 1 tablet (2.5 mg total) by mouth daily.   oxybutynin  (DITROPAN -XL) 10 MG 24 hr tablet Take 10 mg by mouth at bedtime.   pantoprazole  (PROTONIX ) 40 MG tablet TAKE 1 TABLET BY MOUTH 2 TIMES A DAY BEFORE A MEAL   phenazopyridine  (PYRIDIUM ) 100 MG tablet Take 1 tablet (100 mg total) by  mouth 3 (three) times daily as needed for pain (Take for 3 days only.). OK to take 1/2 tablet.   polyethylene glycol powder (GLYCOLAX /MIRALAX ) 17 GM/SCOOP powder as needed.   rosuvastatin  (CRESTOR ) 40 MG tablet Take 1 tablet (40 mg total) by mouth daily.   valACYclovir  (VALTREX ) 500 MG tablet Take 1 tablet (500 mg total) by mouth daily.   methenamine (HIPREX) 1 g tablet Take 1 g by mouth 2 (two) times daily with a meal. (Patient not taking: Reported on 08/31/2023)   [DISCONTINUED] cephALEXin   (KEFLEX ) 500 MG capsule Take 500 mg by mouth 2 (two) times daily at 10 AM and 5 PM.   [DISCONTINUED] estradiol  (ESTRACE ) 0.1 MG/GM vaginal cream Use small amount externally nightly (Patient not taking: Reported on 08/31/2023)   No facility-administered encounter medications on file as of 08/31/2023.    Allergies (verified) Sulfa  antibiotics, Codeine, Doxycycline , and Macrobid  [nitrofurantoin  macrocrystal]   History: Past Medical History:  Diagnosis Date   Allergy    Altered bowel habits 09/07/2019   ANAL FISSURE, HX OF 09/24/2008   Qualifier: Diagnosis of  By: Claudene CMA, Burnard     Anxiety    Anxiety state 09/24/2008   Qualifier: Diagnosis of   By: Claudene CMA, Burnard         Arthritis    Atypical chest pain 09/07/2019   Cancer (HCC)    skin cancer   Constipation 02/06/2010   Qualifier: Diagnosis of   By: Claudene CMA, Burnard         Fibroids    Uterine   GERD (gastroesophageal reflux disease)    Hepatitis    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   History of kidney stones    HSV-2 (herpes simplex virus 2) infection    Hyperlipidemia    IBS (irritable bowel syndrome)    Interstitial cystitis    Nevus    vulva nevus   Prediabetes    RUQ abdominal pain 09/07/2019   UTI (urinary tract infection) 05/23/2020   Past Surgical History:  Procedure Laterality Date   BLADDER REPAIR     BUNIONECTOMY     CHOLECYSTECTOMY     COLONOSCOPY     LAPAROSCOPY  1985/ 1990   due to infertility   LITHOTRIPSY     TONSILLECTOMY     TOTAL HIP ARTHROPLASTY Right 01/04/2022   Procedure: TOTAL HIP ARTHROPLASTY;  Surgeon: Edna Toribio LABOR, MD;  Location: [facility];  Service: Orthopedics;  Laterality: Right;   tubal reconstruction     tubalplasty  1985/ 1990    UPPER GASTROINTESTINAL ENDOSCOPY     VAGINAL DELIVERY     x2   Family History  Problem Relation Age of Onset   Celiac disease Mother    Heart disease Father    Cancer Father        biospy to be done 12/20/2022   Irritable bowel  syndrome Sister    Thyroid  disease Sister    Cancer Sister 38       Melanoma   Heart disease Sister    Pancreatic cancer Maternal Grandmother    Lung cancer Maternal Grandmother    Diabetes Paternal Grandmother    Breast cancer Paternal Grandmother    Kidney disease Other        tumor   Colon cancer Neg Hx    Esophageal cancer Neg Hx    Rectal cancer Neg Hx    Stomach cancer Neg Hx    Social History   Socioeconomic History  Marital status: Married    Spouse name: Not on file   Number of children: 2   Years of education: Not on file   Highest education level: 12th grade  Occupational History   Occupation: retired  Tobacco Use   Smoking status: Former    Current packs/day: 0.00    Types: Cigarettes    Quit date: 12/26/2011    Years since quitting: 11.6    Passive exposure: Past   Smokeless tobacco: Never  Vaping Use   Vaping status: Never Used  Substance and Sexual Activity   Alcohol  use: Yes    Alcohol /week: 5.0 standard drinks of alcohol     Types: 5 Glasses of wine per week    Comment: socially 2 times per week   Drug use: No   Sexual activity: Yes    Partners: Male    Birth control/protection: Post-menopausal  Other Topics Concern   Not on file  Social History Narrative   Not on file   Social Drivers of Health   Financial Resource Strain: Low Risk  (08/27/2023)   Overall Financial Resource Strain (CARDIA)    Difficulty of Paying Living Expenses: Not very hard  Food Insecurity: No Food Insecurity (08/27/2023)   Hunger Vital Sign    Worried About Running Out of Food in the Last Year: Never true    Ran Out of Food in the Last Year: Never true  Transportation Needs: No Transportation Needs (08/27/2023)   PRAPARE - Administrator, Civil Service (Medical): No    Lack of Transportation (Non-Medical): No  Physical Activity: Insufficiently Active (08/27/2023)   Exercise Vital Sign    Days of Exercise per Week: 2 days    Minutes of Exercise per Session: 60  min  Stress: Stress Concern Present (08/27/2023)   [employer] of Occupational Health - Occupational Stress Questionnaire    Feeling of Stress: To some extent  Social Connections: Moderately Isolated (08/27/2023)   Social Connection and Isolation Panel    Frequency of Communication with Friends and Family: More than three times a week    Frequency of Social Gatherings with Friends and Family: Once a week    Attends Religious Services: Never    Database administrator or Organizations: No    Attends Engineer, structural: Not on file    Marital Status: Married    Tobacco Counseling Counseling given: Not Answered    Clinical Intake:  Pre-visit preparation completed: Yes  Pain : No/denies pain     BMI - recorded: 19.1 Nutritional Status: BMI of 19-24  Normal Nutritional Risks: None Diabetes: No  Lab Results  Component Value Date   HGBA1C 5.6 02/12/2022   HGBA1C 5.8 (H) 02/12/2021   HGBA1C 5.8 (H) 02/12/2020     How often do you need to have someone help you when you read instructions, pamphlets, or other written materials from your doctor or pharmacy?: 1 - Never  Interpreter Needed?: No  Information entered by :: Ellouise Jaylene CRUMBLE   Activities of Daily Living     08/27/2023    8:56 AM  In your present state of health, do you have any difficulty performing the following activities:  Hearing? 0  Comment swooshing sound at times in ears  Vision? 0  Difficulty concentrating or making decisions? 0  Walking or climbing stairs? 0  Dressing or bathing? 0  Doing errands, shopping? 0  Preparing Food and eating ? N  Using the Toilet? N  In the past six  months, have you accidently leaked urine? N  Do you have problems with loss of bowel control? N  Managing your Medications? N  Managing your Finances? N  Housekeeping or managing your Housekeeping? N    Patient Care Team: Lucius Krabbe, NP as PCP - General (Family Medicine)  I have updated your Care  Teams any recent Medical Services you may have received from other providers in the past year.     Assessment:   This is a routine wellness examination for [place].  Hearing/Vision screen Hearing Screening - Comments:: Swooshing in ears no loss noted  Vision Screening - Comments:: Wears rx glasses - up to date with routine eye exams with Dr Octavia    Goals Addressed             This Visit's Progress    Patient Stated       Get back into the gym for strength building        Depression Screen     08/31/2023    3:08 PM 04/21/2023    2:38 PM 10/21/2022    1:23 PM 08/13/2022   10:49 AM 04/08/2022   11:40 AM 03/25/2022    4:14 PM 03/12/2022   12:05 PM  PHQ 2/9 Scores  PHQ - 2 Score 0 0 0 0 0 0 0    Fall Risk     08/27/2023    8:56 AM 04/21/2023    2:38 PM 10/21/2022    1:23 PM 08/13/2022   10:49 AM 01/09/2016    8:28 AM  Fall Risk   Falls in the past year? 1 0 0 1 No   Number falls in past yr: 0 0 0 0   Injury with Fall? 1 0 0 1   Comment fell over gate and hit head      Risk for fall due to : History of fall(s) No Fall Risks No Fall Risks History of fall(s);Orthopedic patient   Follow up Falls prevention discussed Falls evaluation completed Falls evaluation completed Falls evaluation completed;Falls prevention discussed      Data saved with a previous flowsheet row definition    MEDICARE RISK AT HOME:  Medicare Risk at Home Any stairs in or around the home?: (Patient-Rptd) Yes If so, are there any without handrails?: (Patient-Rptd) No Home free of loose throw rugs in walkways, pet beds, electrical cords, etc?: (Patient-Rptd) No Adequate lighting in your home to reduce risk of falls?: (Patient-Rptd) Yes Life alert?: (Patient-Rptd) No Use of a cane, walker or w/c?: (Patient-Rptd) No Grab bars in the bathroom?: (Patient-Rptd) Yes Shower chair or bench in shower?: (Patient-Rptd) No Elevated toilet seat or a handicapped toilet?: (Patient-Rptd) No  TIMED UP AND GO:  Was  the test performed?  No  Cognitive Function: 6CIT completed        08/31/2023    3:16 PM  6CIT Screen  What Year? 0 points  What month? 0 points  What time? 0 points  Count back from 20 0 points  Months in reverse 0 points  Repeat phrase 0 points  Total Score 0 points    Immunizations Immunization History  Administered Date(s) Administered   DTaP 01/26/2004   Influenza Inj Mdck Quad Pf 10/29/2016, 10/17/2017, 10/20/2018   Influenza,inj,Quad PF,6+ Mos 10/20/2018, 10/22/2019   Influenza-Unspecified 10/22/2015, 10/29/2016, 10/17/2017, 10/20/2018, 10/23/2020, 11/05/2020, 10/29/2021, 11/04/2022   PFIZER Comirnaty(Gray Top)Covid-19 Tri-Sucrose Vaccine 04/17/2019, 05/08/2019   Pfizer Covid-19 Vaccine Bivalent Booster 76yrs & up 01/01/2020   Tdap 01/28/2014   Zoster, Live  01/26/2007    Screening Tests Health Maintenance  Topic Date Due   Pneumococcal Vaccine: 50+ Years (1 of 2 - PCV) Never done   Zoster Vaccines- Shingrix (1 of 2) 05/23/1976   COVID-19 Vaccine (4 - 2024-25 season) 09/26/2022   INFLUENZA VACCINE  08/26/2023   DTaP/Tdap/Td (3 - Td or Tdap) 01/29/2024   Medicare Annual Wellness (AWV)  08/30/2024   MAMMOGRAM  09/20/2024   Colonoscopy  09/22/2029   DEXA SCAN  Completed   Hepatitis C Screening  Completed   Hepatitis B Vaccines  Aged Out   HPV VACCINES  Aged Out   Meningococcal B Vaccine  Aged Out    Health Maintenance  Health Maintenance Due  Topic Date Due   Pneumococcal Vaccine: 50+ Years (1 of 2 - PCV) Never done   Zoster Vaccines- Shingrix (1 of 2) 05/23/1976   COVID-19 Vaccine (4 - 2024-25 season) 09/26/2022   INFLUENZA VACCINE  08/26/2023   Health Maintenance Items Addressed: See Nurse Notes at the end of this note  Additional Screening:  Vision Screening: Recommended annual ophthalmology exams for early detection of glaucoma and other disorders of the eye. Would you like a referral to an eye doctor? No    Dental Screening: Recommended annual  dental exams for proper oral hygiene  Community Resource Referral / Chronic Care Management: CRR required this visit?  No   CCM required this visit?  No   Plan:    I have personally reviewed and noted the following in the patient's chart:   Medical and social history Use of alcohol , tobacco or illicit drugs  Current medications and supplements including opioid prescriptions. Patient is not currently taking opioid prescriptions. Functional ability and status Nutritional status Physical activity Advanced directives List of other physicians Hospitalizations, surgeries, and ER visits in previous 12 months Vitals Screenings to include cognitive, depression, and falls Referrals and appointments  In addition, I have reviewed and discussed with patient certain preventive protocols, quality metrics, and best practice recommendations. A written personalized care plan for preventive services as well as general preventive health recommendations were provided to patient.   Ellouise VEAR Haws, LPN   01/28/7972   After Visit Summary: (MyChart) Due to this being a telephonic visit, the after visit summary with patients personalized plan was offered to patient via MyChart   Notes: Nothing significant to report at this time.

## 2023-08-31 NOTE — Patient Instructions (Signed)
 Ms. Patricia Ryan , Thank you for taking time out of your busy schedule to complete your Annual Wellness Visit with me. I enjoyed our conversation and look forward to speaking with you again next year. I, as well as your care team,  appreciate your ongoing commitment to your health goals. Please review the following plan we discussed and let me know if I can assist you in the future. Your Game plan/ To Do List    Referrals: If you haven't heard from the office you've been referred to, please reach out to them at the phone provided.   Follow up Visits: We will see or speak with you next year for your Next Medicare AWV with our clinical staff Have you seen your provider in the last 6 months (3 months if uncontrolled diabetes)? Yes  Clinician Recommendations:  Aim for 30 minutes of exercise or brisk walking, 6-8 glasses of water , and 5 servings of fruits and vegetables each day.       This is a list of the screenings recommended for you:  Health Maintenance  Topic Date Due   Pneumococcal Vaccine for age over 18 (1 of 2 - PCV) Never done   Zoster (Shingles) Vaccine (1 of 2) 05/23/1976   COVID-19 Vaccine (4 - 2024-25 season) 09/26/2022   Flu Shot  08/26/2023   DTaP/Tdap/Td vaccine (3 - Td or Tdap) 01/29/2024   Medicare Annual Wellness Visit  08/30/2024   Mammogram  09/20/2024   Colon Cancer Screening  09/22/2029   DEXA scan (bone density measurement)  Completed   Hepatitis C Screening  Completed   Hepatitis B Vaccine  Aged Out   HPV Vaccine  Aged Out   Meningitis B Vaccine  Aged Out    Advanced directives: (Copy Requested) Please bring a copy of your health care power of attorney and living will to the office to be added to your chart at your convenience. You can mail to [facility] [address] [place], [place] [address] or email to [email or web address] Advance Care Planning is important because it:  [x]  Makes sure you receive the medical care that is consistent with  your values, goals, and preferences  [x]  It provides guidance to your family and loved ones and reduces their decisional burden about whether or not they are making the right decisions based on your wishes.  Follow the link provided in your after visit summary or read over the paperwork we have mailed to you to help you started getting your Advance Directives in place. If you need assistance in completing these, please reach out to us  so that we can help you!  See attachments for Preventive Care and Fall Prevention Tips.

## 2023-09-08 ENCOUNTER — Ambulatory Visit (INDEPENDENT_AMBULATORY_CARE_PROVIDER_SITE_OTHER): Admitting: Family

## 2023-09-08 ENCOUNTER — Other Ambulatory Visit: Payer: Self-pay | Admitting: Family

## 2023-09-08 ENCOUNTER — Encounter: Payer: Self-pay | Admitting: Family

## 2023-09-08 VITALS — BP 115/57 | HR 54 | Temp 97.7°F | Ht 63.0 in | Wt 105.4 lb

## 2023-09-08 DIAGNOSIS — H65112 Acute and subacute allergic otitis media (mucoid) (sanguinous) (serous), left ear: Secondary | ICD-10-CM

## 2023-09-08 MED ORDER — AMOXICILLIN 500 MG PO CAPS: 1000.0000 mg | ORAL_CAPSULE | Freq: Three times a day (TID) | ORAL | 0 refills | Status: DC

## 2023-09-08 NOTE — Progress Notes (Signed)
 Patient ID: Tilton VEAR Cable, female    DOB: 11-06-1955, 68 y.o.   MRN: [id number]  Chief Complaint  Patient presents with   Tinnitus    Pt c/o whooshing sounds in right ear. Present for 1 month. Worsens at night.   Discussed the use of AI scribe software for clinical note transcription with the patient, who gave verbal consent to proceed.  History of Present Illness KAYLENN CIVIL is a 68 year old female who presents with ear discomfort and medication management concerns.  Auditory symptoms - 'Whooshing' or 'whistling' sound in the right ear for approximately one month, described as similar to listening to a seashell - Occasional shooting pains in both ears - History of water  exposure in the right ear one month ago - No ear drainage or congestion  Statin therapy adverse effects and medication management - Currently taking rosuvastatin  for hyperlipidemia - Initially prescribed 40 mg daily by cardiology d/t LDL 93, prefers to see <70, but mistakenly took 60 mg daily for two to three weeks - Experiences generalized aches, sluggishness, and occasional heart palpitations - Previously took simvastatin, which caused significant muscle aches, prompting switch to rosuvastatin   Assessment & Plan Right ear pain and tinnitus Intermittent shooting pain and tinnitus in the right ear. TM wnl. Ear canal clear. Left ear erythematous, possibly referred sx. Differential includes referred pain from the left ear. - Can take OTC Ibuprofen  up to 600mg  tid prn for pain  Statin-induced myalgia Myalgia and sluggishness likely due to rosuvastatin  overdose. Symptoms include achiness and occasional heart palpitations. - Discontinue rosuvastatin  for one week. - Resume rosuvastatin  at 40 mg daily after one week. - Call office if myalgia returns  Left ear otitis media Left TM with erythema, no bulging, or fluid noted. Occasional shooting pains. - Sending AMOX 1g tid for 5 days. - OK to take Ibuprofen  as  above prn for pain - Call office back if sx are persisting.  Subjective:    Outpatient Medications Prior to Visit  Medication Sig Dispense Refill   ALPRAZolam  (XANAX ) 0.25 MG tablet TAKE A HALF TO 1 TABLET BY MOUTH EVERY NIGHT AT BEDTIME ONLY AS NEEDED FOR SLEEP. LIMIT USE TO AVOID ADDICTION AND DEMENTIA 18 tablet 3   ascorbic acid (VITAMIN C) 500 MG tablet Take 250 mg by mouth every other day.     bimatoprost (LATISSE) 0.03 % ophthalmic solution Apply 1 drop to eye at bedtime.     CALCIUM  CITRATE PO Take 1 tablet by mouth daily. 600 +D     Cholecalciferol (VITAMIN D ) 50 MCG (2000 UT) tablet Take 2,000 Units by mouth daily.     Cyanocobalamin (B-12 PO) Take 500 mcg by mouth daily.     diltiazem  (CARDIZEM ) 30 MG tablet Take 1 tab twice a day as needed for palpitations 30 tablet 3   estradiol  (ESTRACE ) 0.5 MG tablet Take 0.5 tablets (0.25 mg total) by mouth every other day. 45 tablet 3   famotidine  (PEPCID ) 40 MG tablet Take 1 tablet (40 mg total) by mouth at bedtime. 90 tablet 3   hyoscyamine  (LEVSIN ) 0.125 MG tablet Take 1 tablet (0.125 mg total) by mouth every 4 (four) hours as needed for cramping (diarrhea, nausea). 50 tablet 2   medroxyPROGESTERone  (PROVERA ) 2.5 MG tablet Take 1 tablet (2.5 mg total) by mouth daily. 30 tablet 12   oxybutynin  (DITROPAN -XL) 10 MG 24 hr tablet Take 10 mg by mouth at bedtime.     pantoprazole  (PROTONIX ) 40 MG tablet TAKE 1  TABLET BY MOUTH 2 TIMES A DAY BEFORE A MEAL 60 tablet 6   phenazopyridine  (PYRIDIUM ) 100 MG tablet Take 1 tablet (100 mg total) by mouth 3 (three) times daily as needed for pain (Take for 3 days only.). OK to take 1/2 tablet. 30 tablet 0   polyethylene glycol powder (GLYCOLAX /MIRALAX ) 17 GM/SCOOP powder as needed.     rosuvastatin  (CRESTOR ) 40 MG tablet Take 1 tablet (40 mg total) by mouth daily. 90 tablet 3   valACYclovir  (VALTREX ) 500 MG tablet Take 1 tablet (500 mg total) by mouth daily. 90 tablet 3   methenamine (HIPREX) 1 g tablet Take  1 g by mouth 2 (two) times daily with a meal. (Patient not taking: Reported on 08/31/2023)     No facility-administered medications prior to visit.   Past Medical History:  Diagnosis Date   Allergy    Altered bowel habits 09/07/2019   ANAL FISSURE, HX OF 09/24/2008   Qualifier: Diagnosis of  By: Claudene CMA, Kelly     Anxiety    Anxiety state 09/24/2008   Qualifier: Diagnosis of   By: Claudene CMA, Burnard         Arthritis    Atypical chest pain 09/07/2019   Cancer (HCC)    skin cancer   Constipation 02/06/2010   Qualifier: Diagnosis of   By: Claudene CMA, Burnard         Fibroids    Uterine   GERD (gastroesophageal reflux disease)    Hepatitis    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   History of kidney stones    HSV-2 (herpes simplex virus 2) infection    Hyperlipidemia    IBS (irritable bowel syndrome)    Interstitial cystitis    Nevus    vulva nevus   Prediabetes    RUQ abdominal pain 09/07/2019   UTI (urinary tract infection) 05/23/2020   Past Surgical History:  Procedure Laterality Date   BLADDER REPAIR     BUNIONECTOMY     CHOLECYSTECTOMY     COLONOSCOPY     LAPAROSCOPY  1985/ 1990   due to infertility   LITHOTRIPSY     TONSILLECTOMY     TOTAL HIP ARTHROPLASTY Right 01/04/2022   Procedure: TOTAL HIP ARTHROPLASTY;  Surgeon: Edna Toribio LABOR, MD;  Location: [facility];  Service: Orthopedics;  Laterality: Right;   tubal reconstruction     tubalplasty  1985/ 1990    UPPER GASTROINTESTINAL ENDOSCOPY     VAGINAL DELIVERY     x2   Allergies  Allergen Reactions   Sulfa  Antibiotics Hives, Shortness Of Breath and Rash   Codeine Nausea And Vomiting   Doxycycline  Nausea Only   Macrobid  [Nitrofurantoin  Macrocrystal] Nausea Only      Objective:    Physical Exam Vitals and nursing note reviewed.  Constitutional:      Appearance: Normal appearance.  HENT:     Right Ear: Tympanic membrane and ear canal normal.     Left Ear: Ear canal normal. No drainage or  swelling.  No middle ear effusion. Tympanic membrane is injected and erythematous. Tympanic membrane is not bulging.  Cardiovascular:     Rate and Rhythm: Normal rate and regular rhythm.  Pulmonary:     Effort: Pulmonary effort is normal.     Breath sounds: Normal breath sounds.  Musculoskeletal:        General: Normal range of motion.  Skin:    General: Skin is warm and dry.  Neurological:  Mental Status: She is alert.  Psychiatric:        Mood and Affect: Mood normal.        Behavior: Behavior normal.    BP (!) 115/57 (BP Location: Left Arm, Patient Position: Sitting, Cuff Size: Normal)   Pulse (!) 54   Temp 97.7 F (36.5 C) (Temporal)   Ht 5' 3 (1.6 m)   Wt 105 lb 6 oz (47.8 kg)   LMP 01/20/2011   SpO2 95%   BMI 18.67 kg/m  Wt Readings from Last 3 Encounters:  09/08/23 105 lb 6 oz (47.8 kg)  08/31/23 107 lb 12.8 oz (48.9 kg)  08/05/23 110 lb 9.6 oz (50.2 kg)     Lucius Krabbe, NP

## 2023-09-21 ENCOUNTER — Telehealth: Payer: Self-pay | Admitting: Cardiology

## 2023-09-21 ENCOUNTER — Other Ambulatory Visit: Payer: Self-pay | Admitting: *Deleted

## 2023-09-21 DIAGNOSIS — E785 Hyperlipidemia, unspecified: Secondary | ICD-10-CM

## 2023-09-21 MED ORDER — ROSUVASTATIN CALCIUM 40 MG PO TABS: 20.0000 mg | ORAL_TABLET | Freq: Every day | ORAL | Status: AC

## 2023-09-21 NOTE — Telephone Encounter (Signed)
 Pt reports fatigue and leg aching since increasing the Crestor  last month.  States she is a pretty active person but this has been difficult lately, as her legs are so tired. Pt will decrease dose back to 20 mg until she hears back from the office with MD advisement.

## 2023-09-21 NOTE — Telephone Encounter (Signed)
 Pt made aware of MD recommendation.  She is agreeable, she will start the return to 20 mg daily and return end of November for fasting  labs. Updated MAR, pt will take 1/2 tablet of her 40 mg tablets. Patient verbalized understanding and agreeable to plan.

## 2023-09-21 NOTE — Telephone Encounter (Signed)
  Pt c/o medication issue:  1. Name of Medication:   rosuvastatin  (CRESTOR ) 40 MG tablet    2. How are you currently taking this medication (dosage and times per day)?   Take 1 tablet (40 mg total) by mouth daily.    3. Are you having a reaction (difficulty breathing--STAT)? No   4. What is your medication issue? Patient said since Dr. Lavona increased her crestor  she's been feeling fatigue specially late afternoon. She said she doesn't have any energy to do anything

## 2023-10-06 ENCOUNTER — Encounter: Payer: Self-pay | Admitting: Family

## 2023-10-28 ENCOUNTER — Ambulatory Visit: Payer: Self-pay | Admitting: Cardiology

## 2023-10-28 DIAGNOSIS — E785 Hyperlipidemia, unspecified: Secondary | ICD-10-CM

## 2023-10-28 LAB — LIPID PANEL
Chol/HDL Ratio: 3.1 ratio (ref 0.0–4.4)
Cholesterol, Total: 178 mg/dL (ref 100–199)
HDL: 57 mg/dL (ref >39)
LDL Chol Calc (NIH): 103 mg/dL — ABNORMAL HIGH (ref 0–99)
Triglycerides: 102 mg/dL (ref 0–149)
VLDL Cholesterol Cal: 18 mg/dL (ref 5–40)

## 2023-10-31 ENCOUNTER — Ambulatory Visit (HOSPITAL_BASED_OUTPATIENT_CLINIC_OR_DEPARTMENT_OTHER)
Admission: RE | Admit: 2023-10-31 | Discharge: 2023-10-31 | Disposition: A | Discharge status: Home / Self Care | Source: Ambulatory Visit | Attending: Acute Care | Admitting: Acute Care

## 2023-10-31 DIAGNOSIS — R918 Other nonspecific abnormal finding of lung field: Secondary | ICD-10-CM | POA: Insufficient documentation

## 2023-11-01 MED ORDER — EZETIMIBE 10 MG PO TABS: 10.0000 mg | ORAL_TABLET | Freq: Every day | ORAL | 3 refills | Status: AC

## 2023-11-01 NOTE — Telephone Encounter (Signed)
 Spoke with pt regarding her results. Pt agreeable to plan. Prescription sent. Labs ordered and released. Pt verbalized understanding. All questions if any were answered. Results sent to PCP.

## 2023-11-01 NOTE — Telephone Encounter (Signed)
 Pt requesting a c/b to go over results.

## 2023-11-01 NOTE — Telephone Encounter (Signed)
-----   Message from Lynwood Schilling sent at 10/28/2023  1:04 PM EDT ----- I would suggest adding Zetia 10 mg daily as she still not quite at target.  Call Ms. Fidel with the results and send results to Lucius Krabbe, NP.  Repeat a lipid profile in 3 months. ----- Message ----- From: Interface, [facility] Lab Results In Sent: 10/28/2023   2:37 AM EDT To: Lynwood Schilling, MD

## 2023-11-04 ENCOUNTER — Encounter (HOSPITAL_BASED_OUTPATIENT_CLINIC_OR_DEPARTMENT_OTHER): Payer: Self-pay | Admitting: Obstetrics & Gynecology

## 2023-11-07 LAB — HM DEXA SCAN

## 2023-11-07 LAB — HM MAMMOGRAPHY

## 2023-11-08 ENCOUNTER — Encounter: Payer: Self-pay | Admitting: Family

## 2023-11-09 ENCOUNTER — Encounter (HOSPITAL_BASED_OUTPATIENT_CLINIC_OR_DEPARTMENT_OTHER): Payer: Self-pay | Admitting: Obstetrics & Gynecology

## 2023-11-09 ENCOUNTER — Ambulatory Visit (HOSPITAL_BASED_OUTPATIENT_CLINIC_OR_DEPARTMENT_OTHER): Admitting: Obstetrics & Gynecology

## 2023-11-09 VITALS — BP 124/66 | HR 77 | Ht 63.0 in | Wt 104.6 lb

## 2023-11-09 DIAGNOSIS — N3010 Interstitial cystitis (chronic) without hematuria: Secondary | ICD-10-CM

## 2023-11-09 DIAGNOSIS — R102 Pelvic and perineal pain unspecified side: Secondary | ICD-10-CM

## 2023-11-09 DIAGNOSIS — R1031 Right lower quadrant pain: Secondary | ICD-10-CM

## 2023-11-09 NOTE — Progress Notes (Signed)
 GYNECOLOGY  VISIT  CC:   Right groin/pelvic pressure  HPI: 68 y.o. G2P2 Married White or Caucasian female here for pain in right groin and pressure in pelvis. Pt states that she has pain in right groin that is a burning and pressure sensation and this radiated into the right hip and groin.  When this is more severe, there is pressure in the vagina and rectum as well.  Had hip replacement about two years ago, 12/2021.  She is followed at [facility] ortho.  She has still been seeing him.  MRI last year didn't show anything significant.  Last appt with ortho was last month.  She is having SI injection in November.  Basically no ortho treatment has fixed her symptoms.    She does have hx of IC and she used to see Dr. Janit.  She is now seeing a PA at [facility].  She would like to consider seeing a closer provider.  Would like urogyn referral.  She does have a hx of recurrent UTIs as well.    She taking estradiol  0.5mg  daily and provera  2.5mg  daily.  She has used vaginal estradiol  crea and wil lrestart.  Currently also taking Uro-MP.  Taking this twice daily.    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  First follow up pap 6 months was neg with neg HR HPV.  Second follow up pap showed likely atrophic changes with negative HR HPV.  She used vaginal estrogen cream and 3 month follow up was negative.  Next one is due in March 2026 and this is already scheduled.     Past Medical History:  Diagnosis Date   Allergy    Altered bowel habits 09/07/2019   ANAL FISSURE, HX OF 09/24/2008   Qualifier: Diagnosis of  By: Claudene CMA, Kelly     Anxiety    Anxiety state 09/24/2008   Qualifier: Diagnosis of   By: Claudene CMA, Burnard         Arthritis    Atypical chest pain 09/07/2019   Cancer (HCC)    skin cancer   Constipation 02/06/2010   Qualifier: Diagnosis of   By: Claudene CMA, Burnard         Fibroids    Uterine   GERD (gastroesophageal reflux disease)    Hepatitis    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   History  of kidney stones    HSV-2 (herpes simplex virus 2) infection    Hyperlipidemia    IBS (irritable bowel syndrome)    Interstitial cystitis    Nevus    vulva nevus   Prediabetes    RUQ abdominal pain 09/07/2019   UTI (urinary tract infection) 05/23/2020    MEDS:   Current Outpatient Medications on File Prior to Visit  Medication Sig Dispense Refill   ALPRAZolam  (XANAX ) 0.25 MG tablet TAKE A HALF TO 1 TABLET BY MOUTH EVERY NIGHT AT BEDTIME ONLY AS NEEDED FOR SLEEP. LIMIT USE TO AVOID ADDICTION AND DEMENTIA 18 tablet 3   amoxicillin  (AMOXIL ) 500 MG capsule Take 2 capsules (1,000 mg total) by mouth 3 (three) times daily after meals. 30 capsule 0   ascorbic acid (VITAMIN C) 500 MG tablet Take 250 mg by mouth every other day.     bimatoprost (LATISSE) 0.03 % ophthalmic solution Apply 1 drop to eye at bedtime.     CALCIUM  CITRATE PO Take 1 tablet by mouth daily. 600 +D     Cholecalciferol (VITAMIN D ) 50 MCG (2000 UT)  tablet Take 2,000 Units by mouth daily.     Cyanocobalamin (B-12 PO) Take 500 mcg by mouth daily.     diltiazem  (CARDIZEM ) 30 MG tablet Take 1 tab twice a day as needed for palpitations 30 tablet 3   estradiol  (ESTRACE ) 0.5 MG tablet Take 0.5 tablets (0.25 mg total) by mouth every other day. 45 tablet 3   ezetimibe (ZETIA) 10 MG tablet Take 1 tablet (10 mg total) by mouth daily. 90 tablet 3   famotidine  (PEPCID ) 40 MG tablet Take 1 tablet (40 mg total) by mouth at bedtime. 90 tablet 3   hyoscyamine  (LEVSIN ) 0.125 MG tablet Take 1 tablet (0.125 mg total) by mouth every 4 (four) hours as needed for cramping (diarrhea, nausea). 50 tablet 2   medroxyPROGESTERone  (PROVERA ) 2.5 MG tablet Take 1 tablet (2.5 mg total) by mouth daily. 30 tablet 12   Meth-Hyo-M Bl-Na Phos-Ph Sal (URO-MP) 118 MG CAPS Take 1 capsule by mouth 4 (four) times daily.     methenamine (HIPREX) 1 g tablet Take 1 g by mouth 2 (two) times daily with a meal. (Patient not taking: Reported on 08/31/2023)     oxybutynin   (DITROPAN -XL) 10 MG 24 hr tablet Take 10 mg by mouth at bedtime.     pantoprazole  (PROTONIX ) 40 MG tablet TAKE 1 TABLET BY MOUTH 2 TIMES A DAY BEFORE A MEAL 60 tablet 6   phenazopyridine  (PYRIDIUM ) 100 MG tablet Take 1 tablet (100 mg total) by mouth 3 (three) times daily as needed for pain (Take for 3 days only.). OK to take 1/2 tablet. 30 tablet 0   polyethylene glycol powder (GLYCOLAX /MIRALAX ) 17 GM/SCOOP powder as needed.     rosuvastatin  (CRESTOR ) 40 MG tablet Take 0.5 tablets (20 mg total) by mouth daily.     valACYclovir  (VALTREX ) 500 MG tablet Take 1 tablet (500 mg total) by mouth daily. 90 tablet 3   No current facility-administered medications on file prior to visit.    ALLERGIES: Sulfa  antibiotics, Codeine, Doxycycline , and Macrobid  [nitrofurantoin  macrocrystal]  SH:  married, non smoker  Review of Systems  Constitutional: Negative.   Genitourinary:        Groin pain    PHYSICAL EXAMINATION:    BP 124/66 (BP Location: Left Arm, Patient Position: Sitting, Cuff Size: Normal)   Pulse 77   Ht 5' 3 (1.6 m)   Wt 104 lb 9.6 oz (47.4 kg)   LMP 01/20/2011   SpO2 98%   BMI 18.53 kg/m     General appearance: alert, cooperative and appears stated age Abdomen: soft, non-tender; bowel sounds normal; no masses,  no organomegaly, tenderness to palpation of right groin Lymph:  no inguinal LAD noted  Pelvic: External genitalia:  no lesions              Urethra:  normal appearing urethra with no masses, tenderness or lesions              Bartholins and Skenes: normal                 Vagina: normal mucosa without prolapse or lesions              Cervix: no lesions              Bimanual Exam:  Uterus:  normal size, contour, position, consistency, mobility, non-tender              Adnexa: no mass, fullness, tenderness  Assessment/Plan: 1. Interstitial cystitis (Primary) - Ambulatory referral to  Urogynecology - Ambulatory referral to Physical Therapy  2. Pelvic pain - US  PELVIS  TRANSVAGINAL NON-OB (TV ONLY); Future - pt will return for this - Ambulatory referral to Physical Therapy  3. Right inguinal pain - consider second opinion with ortho.  She will call provider for referral and suggestions and to transfer records

## 2023-11-15 ENCOUNTER — Ambulatory Visit: Payer: Self-pay | Admitting: Family

## 2023-11-30 ENCOUNTER — Encounter (HOSPITAL_BASED_OUTPATIENT_CLINIC_OR_DEPARTMENT_OTHER): Payer: Self-pay | Admitting: Obstetrics & Gynecology

## 2023-11-30 ENCOUNTER — Ambulatory Visit (HOSPITAL_BASED_OUTPATIENT_CLINIC_OR_DEPARTMENT_OTHER): Admitting: Obstetrics & Gynecology

## 2023-11-30 ENCOUNTER — Ambulatory Visit (INDEPENDENT_AMBULATORY_CARE_PROVIDER_SITE_OTHER)

## 2023-11-30 VITALS — BP 134/68 | HR 68 | Wt 106.4 lb

## 2023-11-30 DIAGNOSIS — R102 Pelvic and perineal pain unspecified side: Secondary | ICD-10-CM | POA: Diagnosis not present

## 2023-11-30 DIAGNOSIS — R1031 Right lower quadrant pain: Secondary | ICD-10-CM | POA: Diagnosis not present

## 2023-11-30 DIAGNOSIS — Z79890 Hormone replacement therapy (postmenopausal): Secondary | ICD-10-CM

## 2023-11-30 DIAGNOSIS — Z8619 Personal history of other infectious and parasitic diseases: Secondary | ICD-10-CM

## 2023-11-30 DIAGNOSIS — R1021 Pelvic and perineal pain right side: Secondary | ICD-10-CM

## 2023-11-30 DIAGNOSIS — Z96641 Presence of right artificial hip joint: Secondary | ICD-10-CM | POA: Diagnosis not present

## 2023-11-30 NOTE — Patient Instructions (Signed)
 [facility] at [facility] [address], [place], [place] [address]  Phone: [phone]

## 2023-11-30 NOTE — Progress Notes (Signed)
   Ultrasound f/u Patient name: Patricia Ryan MRN [id number]  Date of birth: [birth year] Chief Complaint:   Follow-up  History of Present Illness:   Patricia Ryan is a 68 y.o. G2P2 Caucasian female being seen today for discussion of ultrasound findings.  She continues to have right groin pain.  Having another injection done to see if will help.  Had ultrasound today just to r/o gyn causes.  Uterus was 4.3 x 3.8 x 2.8cm.  Endometrium thin at 2.32mm.  Ovaries small and atrophic.  No free fluid.  She was tender on exam on the right lower pelvis with transvaginal ultrasound.  Reassured pt imaging of pelvic was normal.  Is considering a second opinion about her hip replacement with different ortho.  Doesn't need help with this at this time.   H/o IC and has decided to transfer care from urology at [facility] to Urogynecology.  Has been referred.  Doesn't have appt yet.  Phone number provided to pt to call as referral was placed previously and is still open for scheduling.     Patient's last menstrual period was 01/20/2011.  Last pap 07/25/2023. Results were: negative. H/O abnormal pap: yes with hx of LEEP  Review of Systems:   Pertinent items are noted in HPI Pertinent History Reviewed:  Reviewed past medical,surgical, social and family history.  Reviewed problem list, medications and allergies. Physical Assessment:   Vitals:   11/30/23 0900  BP: 134/68  Pulse: 68  SpO2: 100%  Weight: 106 lb 6.4 oz (48.3 kg)  Body mass index is 18.85 kg/m.        Physical Examination:   General appearance - well appearing, and in no distress  Mental status - alert, oriented to person, place, and time  Psych:  She has a normal mood and affect  Assessment & Plan:  1. Right-sided pelvic pain (Primary) - normal ultrasound today  2. Right groin pain  3. Hormone replacement therapy (HRT) - on estrace  0.5mg  daily and provera  2.5mg  daily  4. History of right hip replacement - considering second opinion  since many of her symptoms have been since the replacement  No orders of the defined types were placed in this encounter.   Meds: No orders of the defined types were placed in this encounter.   Follow-up: Return in about 4 months (around 03/29/2024).  Appt already scheduled.  Repeat pap and HR HPV will be obtained at that visit.  Total time with pt: 17 minutes and documentation 5 minutes for total time of 22 minutes.  Ronal GORMAN Pinal, MD 12/04/2023 7:20 AM GYNECOLOGY  VISIT

## 2023-12-01 ENCOUNTER — Other Ambulatory Visit (HOSPITAL_BASED_OUTPATIENT_CLINIC_OR_DEPARTMENT_OTHER): Payer: Self-pay | Admitting: Obstetrics & Gynecology

## 2023-12-01 DIAGNOSIS — N390 Urinary tract infection, site not specified: Secondary | ICD-10-CM

## 2023-12-04 DIAGNOSIS — Z8619 Personal history of other infectious and parasitic diseases: Secondary | ICD-10-CM | POA: Insufficient documentation

## 2023-12-04 DIAGNOSIS — R1031 Right lower quadrant pain: Secondary | ICD-10-CM | POA: Insufficient documentation

## 2023-12-19 LAB — LIPID PANEL
Chol/HDL Ratio: 2.7 ratio (ref 0.0–4.4)
Cholesterol, Total: 157 mg/dL (ref 100–199)
HDL: 59 mg/dL (ref >39)
LDL Chol Calc (NIH): 85 mg/dL (ref 0–99)
Triglycerides: 68 mg/dL (ref 0–149)
VLDL Cholesterol Cal: 13 mg/dL (ref 5–40)

## 2023-12-26 ENCOUNTER — Other Ambulatory Visit

## 2023-12-28 NOTE — Therapy (Signed)
 OUTPATIENT PHYSICAL THERAPY FEMALE PELVIC EVALUATION   Patient Name: SONDRA BLIXT MRN: [id number] DOB:18-Aug-1955, 68 y.o., female Today's Date: 12/29/2023  END OF SESSION:  PT End of Session - 12/29/23 1733     Visit Number 1    Date for Recertification  03/28/24    Authorization Type [employer] [id number]  AUTH REQ    Authorization Time Period waiting for auth    Progress Note Due on Visit 10    PT Start Time 1100    PT Stop Time 1140    PT Time Calculation (min) 40 min    Activity Tolerance Patient tolerated treatment well    Behavior During Therapy [facility] for tasks assessed/performed          Past Medical History:  Diagnosis Date   Allergy    Altered bowel habits 09/07/2019   ANAL FISSURE, HX OF 09/24/2008   Qualifier: Diagnosis of  By: Claudene CMA, Burnard     Anxiety    Anxiety state 09/24/2008   Qualifier: Diagnosis of   By: Claudene CMA, Burnard         Arthritis    Atypical chest pain 09/07/2019   Cancer (HCC)    skin cancer   Constipation 02/06/2010   Qualifier: Diagnosis of   By: Claudene CMA, Burnard         Fibroids    Uterine   GERD (gastroesophageal reflux disease)    Hepatitis    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   History of kidney stones    HSV-2 (herpes simplex virus 2) infection    Hyperlipidemia    IBS (irritable bowel syndrome)    Interstitial cystitis    Nevus    vulva nevus   Prediabetes    RUQ abdominal pain 09/07/2019   UTI (urinary tract infection) 05/23/2020   Past Surgical History:  Procedure Laterality Date   BLADDER REPAIR     BUNIONECTOMY     CHOLECYSTECTOMY     COLONOSCOPY     LAPAROSCOPY  1985/ 1990   due to infertility   LITHOTRIPSY     TONSILLECTOMY     TOTAL HIP ARTHROPLASTY Right 01/04/2022   Procedure: TOTAL HIP ARTHROPLASTY;  Surgeon: Edna Toribio LABOR, MD;  Location: [facility];  Service: Orthopedics;  Laterality: Right;   tubal reconstruction     tubalplasty  1985/ 1990    UPPER GASTROINTESTINAL ENDOSCOPY     VAGINAL  DELIVERY     x2   Patient Active Problem List   Diagnosis Date Noted   History of hepatitis A 12/04/2023   Right groin pain 12/04/2023   Palpitations 07/25/2023   Emphysema lung (HCC) 06/10/2023   Atherosclerosis of aorta 06/10/2023   Pulmonary nodules 06/10/2023   Gastroesophageal reflux disease without esophagitis 04/20/2023   History of borderline diabetes mellitus 04/12/2023   Insomnia 04/12/2023   Hormone replacement therapy (HRT) 04/12/2023   Polyarthralgia 02/01/2021   Osteoporosis 10/21/2020   B12 deficiency 02/12/2019   Vitamin D  deficiency 02/12/2019   HSV-2 infection 01/28/2014   IBS (irritable bowel syndrome)    Hyperlipidemia    Fibroids    Chronic interstitial cystitis 11/08/2012    PCP: Lucius Krabbe, NP  REFERRING PROVIDER: Cleotilde Ronal RAMAN, MD  REFERRING DIAG:  N30.10 (ICD-10-CM) - Interstitial cystitis  R10.20 (ICD-10-CM) - Pelvic pain  R10.31 (ICD-10-CM) - Right inguinal pain    THERAPY DIAG:  Muscle weakness (generalized)  Other lack of coordination  Rationale for Evaluation and Treatment: Rehabilitation  ONSET DATE: 12/23  SUBJECTIVE:                                                                                                                                                                                           SUBJECTIVE STATEMENT: Patient reports that she she she had a hip replacement 2 years ago and it feels it still is not healed, incision very tender. Did PT for that. She has right groin pain and sometimes pelvic pressure in rectal and vaginal area at the same time. 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 Yes NPRS scale: 0-6/10 Pain location: right groin and vaginal pressure at times with it  Pain type: aching and burning Pain description: intermittent   Aggravating factors: intercourse, pain in the hips comes and goes Relieving factors: heat  PRECAUTIONS: None  RED FLAGS: None   WEIGHT BEARING RESTRICTIONS: No  FALLS:  Has patient fallen in last 6 months? No and Yes. Number of falls 2  OCCUPATION: retired  ACTIVITY LEVEL : active, goes to the gym  PLOF: Independent  PATIENT GOALS: to have less pain   BOWEL MOVEMENT: constipation and diarrhea, occasionally miralax  Pain with bowel movement: No Type of bowel movement:Type (Bristol Stool Scale) 2 Fully empty rectum: No Leakage: Yes: sometimes                                                  Caused by: diarrhea Bowel urgency: not all the time Pads: Yes: sometimes pantyliner Fiber supplement/laxative Yes Miralax   URINATION: Pain with urination: Yes when she has  UTI Fully empty bladder: Yes:  Post-void dribble: No Stream: Strong Urgency: Yes  Frequency:during the day yes                                                        Nocturia: Yes: 3-4   Leakage: none Pads/briefs: No  INTERCOURSE:  Ability to have vaginal penetration No  Pain with intercourse: yes Dryness: yes Climax: no Marinoff Scale: 3/3 Lubricant:  PREGNANCY:  Vaginal deliveries 2 Tearing Yes:  3-4 degree Episiotomy yes C-section deliveries no Currently pregnant No  PROLAPSE: Pressure   OBJECTIVE:  Note: Objective measures were  completed at Evaluation unless otherwise noted.  DIAGNOSTIC FINDINGS:  Post-void residual: Voiding Cystourethrogram (VCUG):  Ultrasound:   PATIENT SURVEYS:    PFIQ-7: 90 UIQ-7 33 CRAIG -7 29 POPIQ-7 29 Female Sexual Function Index (FSFI) Questionnaire   COGNITION: Overall cognitive status: Within functional limits for tasks assessed     SENSATION: Light touch: Appears intact  LUMBAR SPECIAL TESTS:  Straight leg raise test: Positive on right for weakness right hip  FUNCTIONAL TESTS: to be checked  Single leg stance:  Rt:  Lt: Sit-up test: Squat: Bed mobility:  GAIT: Assistive device utilized: None Comments: mildly antalgic  POSTURE: No Significant postural limitations   LUMBARAROM/PROM:  A/PROM A/PROM  Eval (% available)  Flexion   Extension   Right lateral flexion   Left lateral flexion   Right rotation   Left rotation    (Blank rows = not tested)  LOWER EXTREMITY ROM: full   LOWER EXTREMITY FFU:mphyu hip 4-/5  PALPATION:  General: right hip scar tender  Pelvic Alignment: even  Abdominal:   Diastasis: No Distortion: No  Breathing: upper chest Scar tissue: Yes: right hip Active Straight Leg Raise: right- positive for weakness                External Perineal Exam: mild dryness                             Internal Pelvic Floor: anterior and posterior vaginal wall laxity, unable to bulge vaginally, able to rectally  Patient confirms identification and approves PT to assess internal pelvic floor and treatment Yes All internal or external pelvic floor assessments and/or treatments are completed with proper hand hygiene and gloves hands. If needed gloves are changed with hand hygiene during patient care time.  PELVIC MMT:   MMT eval  Vaginal 2/5, unable to bulge  Internal Anal Sphincter 3/5  External Anal Sphincter 3/5  Puborectalis   (Blank rows = not tested)        TONE: average  PROLAPSE: Anterior and posterior vaginal wall  laxity with stool in rectum  TODAY'S TREATMENT:  DATE:12/29/23 EVAL  Examination completed, findings reviewed, pt educated on POC, HEP, and female pelvic floor anatomy, reasoning with pelvic floor assessment internally with pt consent. Pt motivated to participate in PT and agreeable to attempt recommendations.     PATIENT EDUCATION:  Education details: Pt was educated on relevant anatomy, exam findings, home exercise program, plan of care, expectations of PT and hip scar cupping  Person educated: Patient Education method: Explanation, Demonstration, Tactile cues, Verbal cues, and Handouts Education comprehension: verbalized understanding, returned demonstration, verbal cues required, tactile cues required, and needs further education  HOME EXERCISE PROGRAM: To be developed, patient to work on hip scar cupping first  ASSESSMENT:  CLINICAL IMPRESSION: Patient is a 68 y.o. F who was seen today for physical therapy evaluation and treatment for right hip pain, pelvic pain and pain with intercourse as well as nocturia and fecal smearing. Findings notable for some tenderness bilateral obturators, tight and tender hip scar and right hip weakness. She will benefit from PT to be able have pain free intercourse again, return to the gym, strength and reduce fecal smearing.  OBJECTIVE IMPAIRMENTS: decreased activity tolerance, decreased coordination, difficulty walking, decreased ROM, decreased strength, increased fascial restrictions, impaired tone, and pain.   ACTIVITY LIMITATIONS: continence, toileting, and intercourse  PARTICIPATION LIMITATIONS: interpersonal relationship, community activity, and community  PERSONAL FACTORS: Time since onset of injury/illness/exacerbation are also affecting patient's functional outcome.   REHAB POTENTIAL: Good  CLINICAL DECISION MAKING:  Evolving/moderate complexity  EVALUATION COMPLEXITY: Moderate   GOALS: Goals reviewed with patient? Yes  SHORT TERM GOALS: Target date: 01/26/2024    Patient will be educated on Bowel routine Baseline: Goal status: INITIAL  2.  Educated on abdominal massage Baseline:  Goal status: INITIAL  3.  I with scar cupping Baseline:  Goal status: INITIAL  4.  Urge suppression techniques and healthy bladder habits Baseline:  Goal status: INITIAL  LONG TERM GOALS: Target date: 03/22/2024    Patient will sleep through the night  Baseline:  Goal status: INITIAL  2.  Patient will have no pain with intercourse Baseline:  Goal status: INITIAL  3.  Patient will have no hip pain Baseline:  Goal status: INITIAL  4.  Patient will have no fecal smearing Baseline:  Goal status: INITIAL  5.  Will be able to do workouts in the gym without hip pain Baseline:  Goal status: INITIAL  6.  Will have right hip MMT 5/5 Baseline:  Goal status: INITIAL  PLAN:  PT FREQUENCY: 1-2x/week  PT DURATION: 12 weeks  PLANNED INTERVENTIONS: 97110-Therapeutic exercises, 97530- Therapeutic activity, 97112- Neuromuscular re-education, 97535- Self Care, [id number]- Manual therapy, [phone]- Electrical stimulation (manual), [phone]- Ionotophoresis 4mg /ml Dexamethasone , 79439 (1-2 muscles), [phone] (3+ muscles)- Dry Needling, Patient/Family education, Taping, Joint mobilization, Joint manipulation, Spinal manipulation, Spinal mobilization, Scar mobilization, Cryotherapy, Moist heat, and Biofeedback  PLAN FOR NEXT SESSION: ortho-cupping right hip scar, strengthening bilateral hips and core- bridges, leg press, review her gym exercises and load  Pelvic- abdominal massage and bowel/ bladder healthy habits  Bowyn Mercier, PT 12/29/2023, 5:34 PM

## 2023-12-29 ENCOUNTER — Ambulatory Visit: Attending: Obstetrics & Gynecology | Admitting: Physical Therapy

## 2023-12-29 ENCOUNTER — Encounter: Payer: Self-pay | Admitting: Physical Therapy

## 2023-12-29 ENCOUNTER — Other Ambulatory Visit: Payer: Self-pay

## 2023-12-29 DIAGNOSIS — R102 Pelvic and perineal pain unspecified side: Secondary | ICD-10-CM | POA: Insufficient documentation

## 2023-12-29 DIAGNOSIS — N3010 Interstitial cystitis (chronic) without hematuria: Secondary | ICD-10-CM | POA: Insufficient documentation

## 2023-12-29 DIAGNOSIS — R1031 Right lower quadrant pain: Secondary | ICD-10-CM | POA: Diagnosis not present

## 2023-12-29 DIAGNOSIS — M6281 Muscle weakness (generalized): Secondary | ICD-10-CM | POA: Diagnosis present

## 2023-12-29 DIAGNOSIS — R278 Other lack of coordination: Secondary | ICD-10-CM | POA: Diagnosis present

## 2024-01-04 NOTE — Therapy (Addendum)
 OUTPATIENT PHYSICAL THERAPY FEMALE PELVIC TREATMENT   Patient Name: Patricia Ryan MRN: [id number] DOB:Jan 30, 1955, 68 y.o., female Today's Date: 01/05/2024  END OF SESSION:  PT End of Session - 01/05/24 1405     Visit Number 2    Date for Recertification  03/28/24    Authorization Type [employer] approved 12 visits 12/29/2023 - 03/22/2024 [id number]  [facility] MCR 2025    Authorization Time Period 12/29/2023 - 03/22/2024    Authorization - Visit Number 1    Authorization - Number of Visits 12    Progress Note Due on Visit 10    PT Start Time 1400    PT Stop Time 1440    PT Time Calculation (min) 40 min    Activity Tolerance Patient tolerated treatment well    Behavior During Therapy [facility] for tasks assessed/performed           Past Medical History:  Diagnosis Date   Allergy    Altered bowel habits 09/07/2019   ANAL FISSURE, HX OF 09/24/2008   Qualifier: Diagnosis of  By: Claudene CMA, Burnard     Anxiety    Anxiety state 09/24/2008   Qualifier: Diagnosis of   By: Claudene CMA, Burnard         Arthritis    Atypical chest pain 09/07/2019   Cancer (HCC)    skin cancer   Constipation 02/06/2010   Qualifier: Diagnosis of   By: Claudene CMA, Burnard         Fibroids    Uterine   GERD (gastroesophageal reflux disease)    Hepatitis    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   History of kidney stones    HSV-2 (herpes simplex virus 2) infection    Hyperlipidemia    IBS (irritable bowel syndrome)    Interstitial cystitis    Nevus    vulva nevus   Prediabetes    RUQ abdominal pain 09/07/2019   UTI (urinary tract infection) 05/23/2020   Past Surgical History:  Procedure Laterality Date   BLADDER REPAIR     BUNIONECTOMY     CHOLECYSTECTOMY     COLONOSCOPY     LAPAROSCOPY  1985/ 1990   due to infertility   LITHOTRIPSY     TONSILLECTOMY     TOTAL HIP ARTHROPLASTY Right 01/04/2022   Procedure: TOTAL HIP ARTHROPLASTY;  Surgeon: Edna Toribio LABOR, MD;  Location: [facility];  Service:  Orthopedics;  Laterality: Right;   tubal reconstruction     tubalplasty  1985/ 1990    UPPER GASTROINTESTINAL ENDOSCOPY     VAGINAL DELIVERY     x2   Patient Active Problem List   Diagnosis Date Noted   History of hepatitis A 12/04/2023   Right groin pain 12/04/2023   Palpitations 07/25/2023   Emphysema lung (HCC) 06/10/2023   Atherosclerosis of aorta 06/10/2023   Pulmonary nodules 06/10/2023   Gastroesophageal reflux disease without esophagitis 04/20/2023   History of borderline diabetes mellitus 04/12/2023   Insomnia 04/12/2023   Hormone replacement therapy (HRT) 04/12/2023   Polyarthralgia 02/01/2021   Osteoporosis 10/21/2020   B12 deficiency 02/12/2019   Vitamin D  deficiency 02/12/2019   HSV-2 infection 01/28/2014   IBS (irritable bowel syndrome)    Hyperlipidemia    Fibroids    Chronic interstitial cystitis 11/08/2012    PCP: Lucius Krabbe, NP  REFERRING PROVIDER: Cleotilde Ronal RAMAN, MD  REFERRING DIAG:  N30.10 (ICD-10-CM) - Interstitial cystitis  R10.20 (ICD-10-CM) - Pelvic pain  R10.31 (ICD-10-CM) - Right  inguinal pain    THERAPY DIAG:  Muscle weakness (generalized)  Other lack of coordination  Rationale for Evaluation and Treatment: Rehabilitation  ONSET DATE: 12/23  SUBJECTIVE:                                                                                                                                                                                           SUBJECTIVE STATEMENT: Patient reports that she is doing ok, got the cups.  Went to the gym today. Miralax  supposed to be 1 full cap, sometimes 1/2 cap Sometimes does not have a BM for a week.  Sometimes it can be her diet     Patient reports that she she she had a hip replacement 2 years ago and it feels it still is not healed, incision very tender. Did PT for that. She has right groin pain and sometimes pelvic pressure in rectal and vaginal area at the same time. 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 Yes NPRS scale: 0-6/10 Pain location: right groin and vaginal pressure at times with it  Pain type: aching and burning Pain description: intermittent   Aggravating factors: intercourse, pain in the hips comes and goes Relieving factors: heat  PRECAUTIONS: None  RED FLAGS: None   WEIGHT BEARING RESTRICTIONS: No  FALLS:  Has patient fallen in last 6 months? No and Yes. Number of falls 2  OCCUPATION: retired  ACTIVITY LEVEL : active, goes to the gym  PLOF: Independent  PATIENT GOALS: to have less pain   BOWEL MOVEMENT: constipation and diarrhea, occasionally miralax  Pain with bowel movement: No Type of bowel movement:Type (Bristol Stool Scale) 2 Fully empty rectum: No Leakage: Yes: sometimes  Caused by: diarrhea Bowel urgency: not all the time Pads: Yes: sometimes pantyliner Fiber supplement/laxative Yes Miralax   URINATION: Pain with urination: Yes when she has  UTI Fully empty bladder: Yes:                                           Post-void dribble: No Stream: Strong Urgency: Yes  Frequency:during the day yes                                                        Nocturia: Yes: 3-4   Leakage:  none Pads/briefs: No  INTERCOURSE:  Ability to have vaginal penetration No  Pain with intercourse: yes Dryness: yes Climax: no Marinoff Scale: 3/3 Lubricant:  PREGNANCY:  Vaginal deliveries 2 Tearing Yes:  3-4 degree Episiotomy yes C-section deliveries no Currently pregnant No  PROLAPSE: Pressure   OBJECTIVE:  Note: Objective measures were completed at Evaluation unless otherwise noted.  DIAGNOSTIC FINDINGS:  Post-void residual: Voiding Cystourethrogram (VCUG):  Ultrasound:   PATIENT SURVEYS:    PFIQ-7: 90 UIQ-7 33 CRAIG -7 29 POPIQ-7 29 Female Sexual Function Index (FSFI) Questionnaire   COGNITION: Overall cognitive status: Within functional limits for tasks assessed     SENSATION: Light touch: Appears intact  LUMBAR SPECIAL TESTS:  Straight leg raise test: Positive on right for weakness right hip  FUNCTIONAL TESTS: to be checked  Single leg stance:  Rt:  Lt: Sit-up test: Squat: Bed mobility:  GAIT: Assistive device utilized: None Comments: mildly antalgic  POSTURE: No Significant postural limitations   LUMBARAROM/PROM:  A/PROM A/PROM  Eval (% available)  Flexion   Extension   Right lateral flexion   Left lateral flexion   Right rotation   Left rotation    (Blank rows = not tested)  LOWER EXTREMITY ROM: full   LOWER EXTREMITY FFU:mphyu hip 4-/5  PALPATION:  General: right hip scar tender  Pelvic Alignment: even  Abdominal:   Diastasis: No Distortion: No  Breathing: upper chest Scar tissue: Yes: right hip Active Straight Leg Raise: right- positive for weakness                External Perineal Exam: mild dryness                             Internal Pelvic Floor: anterior and posterior vaginal wall laxity, unable to bulge vaginally, able to rectally  Patient confirms identification and approves PT to assess internal pelvic floor and treatment Yes All internal or external pelvic floor assessments and/or treatments are  completed with proper hand hygiene and gloves hands. If needed gloves are changed with hand hygiene during patient care time.  PELVIC MMT:   MMT eval  Vaginal 2/5, unable to bulge  Internal Anal Sphincter 3/5  External Anal Sphincter 3/5  Puborectalis   (Blank rows = not tested)        TONE: average  PROLAPSE: Anterior and posterior vaginal wall laxity with stool in rectum  TODAY'S TREATMENT:  DATE: 01/05/2024 Education on fiber ( kiwi), hydration,  Going to try poop within 30 mins for 5 mins with gentle bearing  Right Hip scar cupping Abdominal massage for constipation Education on diaphragmatic breathing         12/29/23 EVAL  Examination completed, findings reviewed, pt educated on POC, HEP, and female pelvic floor anatomy, reasoning with pelvic floor assessment internally with pt consent. Pt motivated to participate in PT and agreeable to attempt recommendations.     PATIENT EDUCATION:  Education details: Pt was educated on relevant anatomy, exam findings, home exercise program, plan of care, expectations of PT and hip scar cupping  Person educated: Patient Education method: Explanation, Demonstration, Tactile cues, Verbal cues, and Handouts Education comprehension: verbalized understanding, returned demonstration, verbal cues required, tactile cues required, and needs further education  HOME EXERCISE PROGRAM: To be developed, patient to work on hip scar cupping first  ASSESSMENT:  CLINICAL IMPRESSION: Patient is a 68 y.o. F who was seen today for physical therapy evaluation and treatment for right hip pain, pelvic pain and pain with intercourse as well as nocturia and fecal smearing. Findings notable for some tenderness bilateral obturators, tight and tender hip scar and right hip weakness. Tx focus manual abdominal massage and scar massage  and education on fiber and hydration. She will benefit from PT to be able have pain free intercourse again, return to the gym, strength and reduce fecal smearing.  OBJECTIVE IMPAIRMENTS: decreased activity tolerance, decreased coordination, difficulty walking, decreased ROM, decreased strength, increased fascial restrictions, impaired tone, and pain.   ACTIVITY LIMITATIONS: continence, toileting, and intercourse  PARTICIPATION LIMITATIONS: interpersonal relationship, community activity, and community  PERSONAL FACTORS: Time since onset of injury/illness/exacerbation are also affecting patient's functional outcome.   REHAB POTENTIAL: Good  CLINICAL DECISION MAKING: Evolving/moderate complexity  EVALUATION COMPLEXITY: Moderate   GOALS: Goals reviewed with patient? Yes  SHORT TERM GOALS: Target date: 01/26/2024    Patient will be educated on Bowel routine Baseline: Goal status: INITIAL  2.  Educated on abdominal massage Baseline:  Goal status: INITIAL  3.  I with scar cupping Baseline:  Goal status: INITIAL  4.  Urge suppression techniques and healthy bladder habits Baseline:  Goal status: INITIAL  LONG TERM GOALS: Target date: 03/22/2024    Patient will sleep through the night  Baseline: has to get up at least 2 times to urinate - can be 3-4 times Goal status: INITIAL  2.  Patient will have no pain with intercourse Baseline:  Goal status: INITIAL  3.  Patient will have no hip pain Baseline:  Goal status: INITIAL  4.  Patient will have no fecal smearing Baseline:  Goal status: INITIAL  5.  Will be able to do workouts in the gym without hip pain Baseline:  Goal status: INITIAL  6.  Will have right hip MMT 5/5 Baseline:  Goal status: INITIAL  PLAN:  PT FREQUENCY: 1-2x/week  PT DURATION: 12 weeks  PLANNED INTERVENTIONS: 97110-Therapeutic exercises, 97530- Therapeutic activity, 97112- Neuromuscular re-education, 97535- Self Care, 02859- Manual therapy,  [phone]- Electrical stimulation (manual), [phone]- Ionotophoresis 4mg /ml Dexamethasone , 79439 (1-2 muscles), 20561 (3+ muscles)- Dry Needling, Patient/Family education, Taping, Joint mobilization, Joint manipulation, Spinal manipulation, Spinal mobilization, Scar mobilization, Cryotherapy, Moist heat, and Biofeedback  PLAN FOR NEXT SESSION: ortho-cupping right hip scar, strengthening bilateral hips and core- bridges, leg press, review her gym exercises and load  Pelvic- abdominal massage and bowel/ bladder healthy habits  Quavon Keisling, PT 01/05/2024, 4:57 PM

## 2024-01-05 ENCOUNTER — Ambulatory Visit: Admitting: Physical Therapy

## 2024-01-05 ENCOUNTER — Encounter: Payer: Self-pay | Admitting: Physical Therapy

## 2024-01-05 DIAGNOSIS — R278 Other lack of coordination: Secondary | ICD-10-CM

## 2024-01-05 DIAGNOSIS — M6281 Muscle weakness (generalized): Secondary | ICD-10-CM

## 2024-01-11 ENCOUNTER — Ambulatory Visit: Admitting: Physical Therapy

## 2024-01-13 ENCOUNTER — Encounter (HOSPITAL_BASED_OUTPATIENT_CLINIC_OR_DEPARTMENT_OTHER): Payer: Self-pay | Admitting: Obstetrics & Gynecology

## 2024-01-13 ENCOUNTER — Telehealth: Payer: Self-pay | Admitting: Physician Assistant

## 2024-01-13 NOTE — Telephone Encounter (Signed)
 PT is calling to speak with a nurse regarding her symptoms. She is having pain on her right side, groin area that runs upward. There's also pain in her vagina and rectum. She recently found out she has a UTI but the pain is there without any factor. Could it be coming from her colon or her hip replacemtn.

## 2024-01-14 ENCOUNTER — Other Ambulatory Visit: Payer: Self-pay

## 2024-01-14 ENCOUNTER — Encounter (HOSPITAL_BASED_OUTPATIENT_CLINIC_OR_DEPARTMENT_OTHER): Payer: Self-pay | Admitting: Emergency Medicine

## 2024-01-14 ENCOUNTER — Emergency Department (HOSPITAL_BASED_OUTPATIENT_CLINIC_OR_DEPARTMENT_OTHER)

## 2024-01-14 ENCOUNTER — Emergency Department (HOSPITAL_BASED_OUTPATIENT_CLINIC_OR_DEPARTMENT_OTHER)
Admission: EM | Admit: 2024-01-14 | Discharge: 2024-01-14 | Disposition: A | Discharge status: Home / Self Care | Attending: Emergency Medicine | Admitting: Emergency Medicine

## 2024-01-14 DIAGNOSIS — R1031 Right lower quadrant pain: Secondary | ICD-10-CM | POA: Diagnosis present

## 2024-01-14 LAB — URINALYSIS, ROUTINE W REFLEX MICROSCOPIC
Bacteria, UA: NONE SEEN
Bilirubin Urine: NEGATIVE
Glucose, UA: NEGATIVE mg/dL
Hgb urine dipstick: NEGATIVE
Nitrite: NEGATIVE
Protein, ur: NEGATIVE mg/dL
Specific Gravity, Urine: 1.009 (ref 1.005–1.030)
pH: 6.5 (ref 5.0–8.0)

## 2024-01-14 LAB — COMPREHENSIVE METABOLIC PANEL WITH GFR
ALT: 20 U/L (ref 0–44)
AST: 22 U/L (ref 15–41)
Albumin: 4.4 g/dL (ref 3.5–5.0)
Alkaline Phosphatase: 50 U/L (ref 38–126)
Anion gap: 10 (ref 5–15)
BUN: 11 mg/dL (ref 8–23)
CO2: 26 mmol/L (ref 22–32)
Calcium: 9.8 mg/dL (ref 8.9–10.3)
Chloride: 105 mmol/L (ref 98–111)
Creatinine, Ser: 0.54 mg/dL (ref 0.44–1.00)
GFR, Estimated: >60 mL/min
Glucose, Bld: 113 mg/dL — ABNORMAL HIGH (ref 70–99)
Potassium: 4.1 mmol/L (ref 3.5–5.1)
Sodium: 141 mmol/L (ref 135–145)
Total Bilirubin: 0.4 mg/dL (ref 0.0–1.2)
Total Protein: 6.7 g/dL (ref 6.5–8.1)

## 2024-01-14 LAB — CBC
HCT: 41.6 % (ref 36.0–46.0)
Hemoglobin: 14 g/dL (ref 12.0–15.0)
MCH: 32.2 pg (ref 26.0–34.0)
MCHC: 33.7 g/dL (ref 30.0–36.0)
MCV: 95.6 fL (ref 80.0–100.0)
Platelets: 240 K/uL (ref 150–400)
RBC: 4.35 MIL/uL (ref 3.87–5.11)
RDW: 12.3 % (ref 11.5–15.5)
WBC: 6.1 K/uL (ref 4.0–10.5)
nRBC: 0 % (ref 0.0–0.2)

## 2024-01-14 LAB — LIPASE, BLOOD: Lipase: 23 U/L (ref 11–51)

## 2024-01-14 MED ORDER — MELOXICAM 7.5 MG PO TABS: 7.5000 mg | ORAL_TABLET | Freq: Every day | ORAL | 0 refills | Status: AC | PRN

## 2024-01-14 MED ORDER — IOHEXOL 300 MG/ML  SOLN
65.0000 mL | Freq: Once | INTRAMUSCULAR | Status: AC | PRN
  Administered 2024-01-14: 65 mL via INTRAVENOUS

## 2024-01-14 NOTE — ED Provider Notes (Signed)
 " [place] EMERGENCY DEPARTMENT AT [facility] Provider Note   CSN: [id number] Arrival date & time: 01/14/24  1641     Patient presents with: Groin Pain   Patricia Ryan is a 68 y.o. female.   Patient with history of cholecystectomy, bladder repair with mesh, history of kidney stone, followed by urology, history of interstitial cystitis, right hip replacement (12/2021), recurrent UTI --presents to the emergency department today for evaluation of right groin and pelvic pain.  Patient has had this pain intermittently for at least 1 year.  She states that she has seen primary care, orthopedics, OB/GYN for the symptoms.  She states that evaluation by Ortho has been unrevealing.  OB/GYN currently has her going to pelvic floor rehab and is trying to get her in with a urogynecologist.  Patient states that her pain has been more intense and more persistent over the past 3 days.  She initially went to urgent care a few days ago, had a dipstick that was suggestive of UTI, she was placed on cefdinir which she has taken.  She voices frustration about not having a reason for the pain.  She denies fever, chest pain, shortness of breath.  No vomiting, diarrhea, constipation.  Pain is worse when she moves and is active, better when she sits down for a while.  She denies vaginal bleeding or discharge.  Patient has been on uro-MP for symptoms, prescribed by urologist, but does not feel that these have been helpful.       Prior to Admission medications  Medication Sig Start Date End Date Taking? Authorizing Provider  ALPRAZolam  (XANAX ) 0.25 MG tablet TAKE A HALF TO 1 TABLET BY MOUTH EVERY NIGHT AT BEDTIME ONLY AS NEEDED FOR SLEEP. LIMIT USE TO AVOID ADDICTION AND DEMENTIA 08/31/23   Lucius Krabbe, NP  ascorbic acid (VITAMIN C) 500 MG tablet Take 250 mg by mouth every other day.    [provider]  bimatoprost (LATISSE) 0.03 % ophthalmic solution Apply 1 drop to eye at bedtime. 04/22/22    [provider]  CALCIUM  CITRATE PO Take 1 tablet by mouth daily. 600 +D    [provider]  Cholecalciferol (VITAMIN D ) 50 MCG (2000 UT) tablet Take 2,000 Units by mouth daily.    [provider]  Cyanocobalamin (B-12 PO) Take 500 mcg by mouth daily.    [provider]  diltiazem  (CARDIZEM ) 30 MG tablet Take 1 tab twice a day as needed for palpitations 07/26/23   Lynwood Schilling, MD  estradiol  (ESTRACE ) 0.01 % CREA vaginal cream USE A SMALL AMOUNT EXTERNALLY NIGHTLY 12/01/23   Cleotilde Ronal RAMAN, MD  estradiol  (ESTRACE ) 0.5 MG tablet Take 0.5 tablets (0.25 mg total) by mouth every other day. 04/21/23   Cleotilde Ronal RAMAN, MD  ezetimibe  (ZETIA ) 10 MG tablet Take 1 tablet (10 mg total) by mouth daily. 11/01/23   Lynwood Schilling, MD  famotidine  (PEPCID ) 40 MG tablet Take 1 tablet (40 mg total) by mouth at bedtime. 12/17/22   Craig Alan SAUNDERS, PA-C  hyoscyamine  (LEVSIN ) 0.125 MG tablet Take 1 tablet (0.125 mg total) by mouth every 4 (four) hours as needed for cramping (diarrhea, nausea). 06/08/21   Wilkinson, Dana E, NP  medroxyPROGESTERone  (PROVERA ) 2.5 MG tablet Take 1 tablet (2.5 mg total) by mouth daily. 04/21/23   Cleotilde Ronal RAMAN, MD  Meth-Hyo-M Starlene Phos-Ph Sal (URO-MP) 118 MG CAPS Take 1 capsule by mouth 4 (four) times daily.    [provider]  oxybutynin  (DITROPAN -XL)  10 MG 24 hr tablet Take 10 mg by mouth at bedtime.    [provider]  pantoprazole  (PROTONIX ) 40 MG tablet TAKE 1 TABLET BY MOUTH 2 TIMES A DAY BEFORE A MEAL 05/05/23   Collier, Amanda R, PA-C  phenazopyridine  (PYRIDIUM ) 100 MG tablet Take 1 tablet (100 mg total) by mouth 3 (three) times daily as needed for pain (Take for 3 days only.). OK to take 1/2 tablet. 05/23/23   Lucius Krabbe, NP  polyethylene glycol powder (GLYCOLAX /MIRALAX ) 17 GM/SCOOP powder as needed. 02/07/15   [provider]  rosuvastatin  (CRESTOR ) 40 MG tablet Take 0.5 tablets (20 mg total) by mouth daily. 09/21/23    Lynwood Schilling, MD  valACYclovir  (VALTREX ) 500 MG tablet Take 1 tablet (500 mg total) by mouth daily. 04/21/23   Cleotilde Ronal RAMAN, MD    Allergies: Sulfa  antibiotics, Codeine, Doxycycline , and Macrobid  [nitrofurantoin  macrocrystal]    Review of Systems  Updated Vital Signs BP (!) 144/77   Pulse 76   Temp 97.9 F (36.6 C)   Resp 20   LMP 01/20/2011   SpO2 100%   Physical Exam Vitals and nursing note reviewed.  Constitutional:      General: She is not in acute distress.    Appearance: She is well-developed.  HENT:     Head: Normocephalic and atraumatic.     Right Ear: External ear normal.     Left Ear: External ear normal.     Nose: Nose normal.  Eyes:     Conjunctiva/sclera: Conjunctivae normal.  Cardiovascular:     Rate and Rhythm: Normal rate and regular rhythm.     Heart sounds: No murmur heard. Pulmonary:     Effort: No respiratory distress.     Breath sounds: No wheezing, rhonchi or rales.  Abdominal:     Palpations: Abdomen is soft.     Tenderness: There is abdominal tenderness. There is no guarding or rebound.  Musculoskeletal:     Cervical back: Normal range of motion and neck supple.     Right lower leg: No edema.     Left lower leg: No edema.  Skin:    General: Skin is warm and dry.     Findings: No rash.  Neurological:     General: No focal deficit present.     Mental Status: She is alert. Mental status is at baseline.     Motor: No weakness.  Psychiatric:        Mood and Affect: Mood normal.     (all labs ordered are listed, but only abnormal results are displayed) Labs Reviewed  COMPREHENSIVE METABOLIC PANEL WITH GFR - Abnormal; Notable for the following components:      Result Value   Glucose, Bld 113 (*)    All other components within normal limits  URINALYSIS, ROUTINE W REFLEX MICROSCOPIC - Abnormal; Notable for the following components:   Color, Urine COLORLESS (*)    Ketones, ur TRACE (*)    Leukocytes,Ua SMALL (*)    All other  components within normal limits  LIPASE, BLOOD  CBC    EKG: None  Radiology: CT ABDOMEN PELVIS W CONTRAST Result Date: 01/14/2024 EXAM: CT ABDOMEN AND PELVIS WITH CONTRAST 01/14/2024 06:26:54 PM TECHNIQUE: CT of the abdomen and pelvis was performed with the administration of intravenous contrast. 65 mL of iohexol  (OMNIPAQUE ) 300 MG/ML solution was administered. Multiplanar reformatted images are provided for review. Automated exposure control, iterative reconstruction, and/or weight-based adjustment of the mA/kV was utilized to reduce the  radiation dose to as low as reasonably achievable. COMPARISON: MRI 10/03/2019. CLINICAL HISTORY: RLQ abdominal pain. FINDINGS: LOWER CHEST: No acute abnormality. LIVER: The liver is unremarkable. GALLBLADDER AND BILE DUCTS: Cholecystectomy. Dilated common bile duct measuring up to 14 mm favored due to reservoir effect. If abnormal MRCP/ERCP is recommended. SPLEEN: No acute abnormality. PANCREAS: No acute abnormality. ADRENAL GLANDS: No acute abnormality. KIDNEYS, URETERS AND BLADDER: Parapelvic cysts in the left kidney. No follow-up recommended. Streak artifact from the right hemiarthroplasty obscures the distal right ureter. No stones in the kidneys or ureters. No hydronephrosis. No perinephric or periureteral stranding. Urinary bladder is unremarkable. GI AND BOWEL: Mobile cecum located in the anteroleft pelvis. Normal appendix. Moderate stool burden compatible with constipation. Stomach demonstrates no acute abnormality. There is no bowel obstruction. PERITONEUM AND RETROPERITONEUM: No ascites. No free air. VASCULATURE: Aorta is normal in caliber. LYMPH NODES: No lymphadenopathy. REPRODUCTIVE ORGANS: No acute abnormality. BONES AND SOFT TISSUES: No acute osseous abnormality. No focal soft tissue abnormality. Right hip arthroplasty. IMPRESSION: 1. No acute findings in the right lower quadrant. Normal appendix. Mobile cecum located in the anteroleft pelvis. 2. Moderate  stool burden compatible with constipation. 3. Cholecystectomy with dilated common bile duct measuring up to 14 mm, favored due to reservoir effect; with LFTS and consider MRCP/ERCP if abnormal. Electronically signed by: Norman Gatlin MD 01/14/2024 07:09 PM EST RP Workstation: [id number]     Procedures   Medications Ordered in the ED - No data to display  ED Course  Patient seen and examined. History obtained directly from patient. Work-up including labs, imaging, EKG ordered in triage, if performed, were reviewed.    Labs/EKG: Independently reviewed and interpreted.  This included: CBC with normal white blood cell count and hemoglobin; CMP glucose 113 otherwise unremarkable; lipase normal.  Pending UA.  Imaging: CT abdomen pelvis ordered to evaluate for right lower quadrant pathology including appendicitis.  Discussed with patient that given chronicity of her symptoms, this may not reveal an exact cause of her symptoms, but would rule out potential other etiologies.  Medications/Fluids: None ordered  Most recent vital signs reviewed and are as follows: BP (!) 144/77   Pulse 76   Temp 97.9 F (36.6 C)   Resp 20   LMP 01/20/2011   SpO2 100%   Initial impression: Chronic right lower quadrant and pelvic pain, worse over the past 3 days.  7:29 PM Reassessment performed. Patient appears stable, comfortable.  Labs personally reviewed and interpreted including: UA with question of infection, but as expected given recent UTI diagnosis.  Imaging personally visualized and interpreted including: CT abdomen pelvis, agree no acute findings to explain the pain.  Reviewed pertinent lab work and imaging with patient at bedside. Questions answered.   Most current vital signs reviewed and are as follows: BP (!) 144/77   Pulse 76   Temp 97.9 F (36.6 C)   Resp 20   LMP 01/20/2011   SpO2 100%   Plan: Discharge to home.   Prescriptions written for: Meloxicam  trial, discussed to take with  food.  She can use this as needed.  Other home care instructions discussed: Continue home treatments, therapy.  ED return instructions discussed: The patient was urged to return to the Emergency Department immediately with worsening of current symptoms, worsening abdominal pain, persistent vomiting, blood noted in stools, fever, or any other concerns. The patient verbalized understanding.   Follow-up instructions discussed: Patient encouraged to follow-up with their PCP in 7 days.  Medical Decision Making Amount and/or Complexity of Data Reviewed Labs: ordered. Radiology: ordered.  Risk Prescription drug management.   For this patient's complaint of abdominal pain, the following conditions were considered on the differential diagnosis: gastritis/PUD, enteritis/duodenitis, appendicitis, cholelithiasis/cholecystitis, cholangitis, pancreatitis, ruptured viscus, colitis, diverticulitis, small/large bowel obstruction, proctitis, cystitis, pyelonephritis, ureteral colic, aortic dissection, aortic aneurysm. In women, pelvic inflammatory disease, ovarian cysts, and tubo-ovarian abscess were also considered. Atypical chest etiologies were also considered including ACS, PE, and pneumonia.  No acute causes identified on CT scan.  Patient will continue home treatment, will follow-up with urogynecology for further evaluation.  The patient's vital signs, pertinent lab work and imaging were reviewed and interpreted as discussed in the ED course. Hospitalization was considered for further testing, treatments, or serial exams/observation. However as patient is well-appearing, has a stable exam, and reassuring studies today, I do not feel that they warrant admission at this time. This plan was discussed with the patient who verbalizes agreement and comfort with this plan and seems reliable and able to return to the Emergency Department with worsening or changing symptoms.         Final diagnoses:  Right lower quadrant abdominal pain    ED Discharge Orders          Ordered    meloxicam  (MOBIC ) 7.5 MG tablet  Daily PRN        01/14/24 1929               Desiderio Chew, DEVONNA 01/14/24 1931  "

## 2024-01-14 NOTE — Discharge Instructions (Addendum)
 Please read and follow all provided instructions.  Your diagnoses today include:  1. Right lower quadrant abdominal pain     Tests performed today include: Complete blood cell count: Normal white blood cell count Complete metabolic panel: Normal liver function and kidney function test Lipase (pancreas function test): Was negative Urinalysis (urine test): Did have some white blood cells CT abdomen pelvis: no acute findings Vital signs. See below for your results today.   Medications prescribed:  Meloxicam  - anti-inflammatory pain medication  You have been prescribed an anti-inflammatory medication or NSAID. Take with food. Do not take aspirin , ibuprofen , or naproxen if taking this medication. Take smallest effective dose for the shortest duration needed for your pain. Stop taking if you experience stomach pain or vomiting.   Take any prescribed medications only as directed.  Home care instructions:  Follow any educational materials contained in this packet.  Follow-up instructions: Please follow-up with your primary care provider in the next 7 days for further evaluation of your symptoms.    Return instructions:  SEEK IMMEDIATE MEDICAL ATTENTION IF: The pain does not go away or becomes severe  A temperature above 101F develops  Repeated vomiting occurs (multiple episodes)  The pain becomes localized to portions of the abdomen. The right side could possibly be appendicitis. In an adult, the left lower portion of the abdomen could be colitis or diverticulitis.  Blood is being passed in stools or vomit (bright red or black tarry stools)  You develop chest pain, difficulty breathing, dizziness or fainting, or become confused, poorly responsive, or inconsolable (young children) If you have any other emergent concerns regarding your health  Additional Information: Abdominal (belly) pain can be caused by many things. Your caregiver performed an examination and possibly ordered  blood/urine tests and imaging (CT scan, x-rays, ultrasound). Many cases can be observed and treated at home after initial evaluation in the emergency department. Even though you are being discharged home, abdominal pain can be unpredictable. Therefore, you need a repeated exam if your pain does not resolve, returns, or worsens. Most patients with abdominal pain don't have to be admitted to the hospital or have surgery, but serious problems like appendicitis and gallbladder attacks can start out as nonspecific pain. Many abdominal conditions cannot be diagnosed in one visit, so follow-up evaluations are very important.  Your vital signs today were: BP (!) 144/77   Pulse 76   Temp 97.9 F (36.6 C)   Resp 20   LMP 01/20/2011   SpO2 100%  If your blood pressure (bp) was elevated above 135/85 this visit, please have this repeated by your doctor within one month. --------------

## 2024-01-14 NOTE — ED Triage Notes (Addendum)
 Right groin pain intermittent x1 year. Worse past 3 days. Abd pain and bloating as well. Some tenderness to the area- right groin and into RLQ. No obvious swelling noted.  Is on day 3 of antibiotic for UTI.

## 2024-01-16 ENCOUNTER — Ambulatory Visit: Admitting: Physical Therapy

## 2024-01-16 DIAGNOSIS — M6281 Muscle weakness (generalized): Secondary | ICD-10-CM | POA: Diagnosis not present

## 2024-01-16 DIAGNOSIS — R278 Other lack of coordination: Secondary | ICD-10-CM

## 2024-01-16 NOTE — Telephone Encounter (Signed)
 Spoke with patient.  Provided recommendations for bowel purge and samples left at 2nd floor reception. Sent message through mychart as well. For patient to read recommendations. Patient voiced understanding.

## 2024-01-16 NOTE — Patient Instructions (Signed)
 Toileting Mechanics 101: Urination  Positioning: sit all the way down on the toilet, feet flat on the floor, trunk relaxed Hovering over the toilet seat can impact your ability to relax the pelvic floor musculature and empty your bladder well  When you initiate urination, try to let the urine flow out naturally rather than pushing down. Pushing can limit the ability of your urethral sphincter to relax and open. Double voiding technique: When you think you are done urinating, try gently pushing to see if there is any remaining urine that can be expelled   You can try the following techniques to see if there is any remaining urine that can be expelled: Rocking back and forth Leaning side to side  Twisting your trunk  Taking deep belly breaths to promote blood flow to pelvic floor musculature  Defecation  Positioning: sit all the way down on the toilet, feet flat on the floor, trunk relaxed You can try using a squatty potty or toilet stool under your feet to change the angle of your rectum in your pelvic floor, making it easier to pass stool with less straining  Avoid straining or breath holding while on the toilet. This causes increased intra-abdominal pressure, which causes increased pressure down through the pelvic floor. We want to avoid this if possible. If you do feel the need to push to pass bowel movements, practice the following technique instead: Imagine you are fogging up a mirror with your breath as you exhale and gently push down and out through your pelvic floor. This relieves some pressure while you're still able to push the stool out.  You can try the following techniques to see if there is any remaining stool that can be expelled: Rocking back and forth Leaning side to side  Twisting your trunk  Taking deep belly breaths to promote blood flow to pelvic floor musculature

## 2024-01-16 NOTE — Therapy (Signed)
 " OUTPATIENT PHYSICAL THERAPY FEMALE PELVIC TREATMENT   Patient Name: Patricia Ryan MRN: [id number] DOB:[birth year], 68 y.o., female Today's Date: 01/16/2024  END OF SESSION:  PT End of Session - 01/16/24 1614     Visit Number 3    Date for Recertification  03/28/24    Authorization Type [employer] approved 12 visits 12/29/2023 - 03/22/2024 [id number]  [facility] MCR 2025    Authorization Time Period 12/29/2023 - 03/22/2024    Authorization - Visit Number 3    Authorization - Number of Visits 12    Progress Note Due on Visit 10    PT Start Time 0330    PT Stop Time 0415    PT Time Calculation (min) 45 min    Activity Tolerance Patient tolerated treatment well    Behavior During Therapy [facility] for tasks assessed/performed            Past Medical History:  Diagnosis Date   Allergy    Altered bowel habits 09/07/2019   ANAL FISSURE, HX OF 09/24/2008   Qualifier: Diagnosis of  By: Claudene CMA, Burnard     Anxiety    Anxiety state 09/24/2008   Qualifier: Diagnosis of   By: Claudene CMA, Burnard         Arthritis    Atypical chest pain 09/07/2019   Cancer (HCC)    skin cancer   Constipation 02/06/2010   Qualifier: Diagnosis of   By: Claudene CMA, Burnard         Fibroids    Uterine   GERD (gastroesophageal reflux disease)    Hepatitis    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   History of kidney stones    HSV-2 (herpes simplex virus 2) infection    Hyperlipidemia    IBS (irritable bowel syndrome)    Interstitial cystitis    Nevus    vulva nevus   Prediabetes    RUQ abdominal pain 09/07/2019   UTI (urinary tract infection) 05/23/2020   Past Surgical History:  Procedure Laterality Date   BLADDER REPAIR     BUNIONECTOMY     CHOLECYSTECTOMY     COLONOSCOPY     LAPAROSCOPY  1985/ 1990   due to infertility   LITHOTRIPSY     TONSILLECTOMY     TOTAL HIP ARTHROPLASTY Right 01/04/2022   Procedure: TOTAL HIP ARTHROPLASTY;  Surgeon: Edna Toribio LABOR, MD;  Location: [facility];  Service:  Orthopedics;  Laterality: Right;   tubal reconstruction     tubalplasty  1985/ 1990    UPPER GASTROINTESTINAL ENDOSCOPY     VAGINAL DELIVERY     x2   Patient Active Problem List   Diagnosis Date Noted   History of hepatitis A 12/04/2023   Right groin pain 12/04/2023   Palpitations 07/25/2023   Emphysema lung (HCC) 06/10/2023   Atherosclerosis of aorta 06/10/2023   Pulmonary nodules 06/10/2023   Gastroesophageal reflux disease without esophagitis 04/20/2023   History of borderline diabetes mellitus 04/12/2023   Insomnia 04/12/2023   Hormone replacement therapy (HRT) 04/12/2023   Polyarthralgia 02/01/2021   Osteoporosis 10/21/2020   B12 deficiency 02/12/2019   Vitamin D  deficiency 02/12/2019   HSV-2 infection 01/28/2014   IBS (irritable bowel syndrome)    Hyperlipidemia    Fibroids    Chronic interstitial cystitis 11/08/2012    PCP: Lucius Krabbe, NP  REFERRING PROVIDER: Cleotilde Ronal RAMAN, MD  REFERRING DIAG:  N30.10 (ICD-10-CM) - Interstitial cystitis  R10.20 (ICD-10-CM) - Pelvic pain  R10.31 (ICD-10-CM) -  Right inguinal pain    THERAPY DIAG:  Muscle weakness (generalized)  Other lack of coordination  Rationale for Evaluation and Treatment: Rehabilitation  ONSET DATE: 12/23  SUBJECTIVE:                                                                                                                                                                                           SUBJECTIVE STATEMENT: She has been using cups on her hip scar lat home - she thinks she is doing it okay. She went to the ER on Saturday due to right inguinal pain. Miralax  is starting to work - she is taking 1 capful in the mornings. The CT scan ruled out a kidney stone and bowel obstruction.   Eval: Patient reports that she she she had a hip replacement 2 years ago and it feels it still is not healed, incision very tender. Did PT for that. She has right groin pain and sometimes pelvic pressure  in rectal and vaginal area at the same time. 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 Yes NPRS scale: 0-6/10 Pain location: right groin and vaginal pressure at times with it  Pain type: aching and burning Pain description: intermittent   Aggravating factors: intercourse, pain in the hips comes and goes Relieving factors: heat  PRECAUTIONS: None  RED FLAGS: None   WEIGHT BEARING RESTRICTIONS: No  FALLS:  Has patient fallen in last 6 months? No and Yes. Number of falls 2  OCCUPATION: retired  ACTIVITY LEVEL : active, goes to the gym  PLOF: Independent  PATIENT GOALS: to have less pain   BOWEL MOVEMENT: constipation and diarrhea, occasionally miralax  Pain with bowel movement: No Type of bowel movement:Type (Bristol Stool Scale) 2 Fully empty rectum: No Leakage: Yes: sometimes  Caused by: diarrhea Bowel urgency: not all the time Pads: Yes: sometimes pantyliner Fiber supplement/laxative Yes Miralax   URINATION: Pain with urination: Yes when she has  UTI Fully empty bladder: Yes:                                           Post-void dribble: No Stream: Strong Urgency: Yes  Frequency:during the day yes                                                         Nocturia: Yes: 3-4   Leakage: none Pads/briefs: No  INTERCOURSE:  Ability to have vaginal penetration No  Pain with intercourse: yes Dryness: yes Climax: no Marinoff Scale: 3/3 Lubricant:  PREGNANCY:  Vaginal deliveries 2 Tearing Yes:  3-4 degree Episiotomy yes C-section deliveries no Currently pregnant No  PROLAPSE: Pressure   OBJECTIVE:  Note: Objective measures were completed at Evaluation unless otherwise noted.  DIAGNOSTIC FINDINGS:  Post-void residual: Voiding Cystourethrogram (VCUG):  Ultrasound:   PATIENT SURVEYS:    PFIQ-7: 90 UIQ-7 33 CRAIG -7 29 POPIQ-7 29 Female Sexual Function Index (FSFI) Questionnaire   COGNITION: Overall cognitive status: Within functional limits for tasks assessed     SENSATION: Light touch: Appears intact  LUMBAR SPECIAL TESTS:  Straight leg raise test: Positive on right for weakness right hip  FUNCTIONAL TESTS: to be checked  Single leg stance:  Rt:  Lt: Sit-up test: Squat: Bed mobility:  GAIT: Assistive device utilized: None Comments: mildly antalgic  POSTURE: No Significant postural limitations   LUMBARAROM/PROM:  A/PROM A/PROM  Eval (% available)  Flexion   Extension   Right lateral flexion   Left lateral flexion   Right rotation   Left rotation    (Blank rows = not tested)  LOWER EXTREMITY ROM: full   LOWER EXTREMITY FFU:mphyu hip 4-/5  PALPATION:  General: right hip scar tender  Pelvic Alignment: even  Abdominal:   Diastasis: No Distortion: No  Breathing: upper chest Scar tissue: Yes: right hip Active Straight Leg Raise: right- positive for weakness                External Perineal Exam: mild dryness                             Internal Pelvic Floor: anterior and posterior vaginal wall laxity, unable to bulge vaginally, able to rectally  Patient confirms identification and approves PT to assess internal pelvic floor and treatment Yes All internal or  external pelvic floor assessments and/or treatments are completed with proper hand hygiene and gloves hands. If needed gloves are changed with hand hygiene during patient care time.  PELVIC MMT:   MMT eval  Vaginal 2/5, unable to bulge  Internal Anal Sphincter 3/5  External Anal Sphincter 3/5  Puborectalis   (Blank rows = not tested)        TONE: average  PROLAPSE: Anterior and posterior vaginal wall laxity with stool in rectum  TODAY'S TREATMENT:  DATE: 01/16/24: Supine diaphragmatic breathing + pelvic floor relaxation 2x10  Supine butterfly stretch + diaphragmatic breathing 2x68min  Single knee to chest + diaphragmatic breathing 2x15min  Lower trunk rotation + diaphragmatic breathing 2x10  Figure 4 piriformis stretch + diaphragmatic breathing 2x71min  01/05/2024 Education on fiber ( kiwi), hydration,  Going to try poop within 30 mins for 5 mins with gentle bearing  Right Hip scar cupping Abdominal massage for constipation Education on diaphragmatic breathing   12/29/23 EVAL  Examination completed, findings reviewed, pt educated on POC, HEP, and female pelvic floor anatomy, reasoning with pelvic floor assessment internally with pt consent. Pt motivated to participate in PT and agreeable to attempt recommendations.    PATIENT EDUCATION:  Education details: Pt was educated on relevant anatomy, exam findings, home exercise program, plan of care, expectations of PT and hip scar cupping  Person educated: Patient Education method: Explanation, Demonstration, Tactile cues, Verbal cues, and Handouts Education comprehension: verbalized understanding, returned demonstration, verbal cues required, tactile cues required, and needs further education  HOME EXERCISE PROGRAM: Access Code: [email or web address] URL: [email or web address] Date: 01/16/2024 Prepared by:  Celena Domino  Exercises - Supine Diaphragmatic Breathing  - 1 x daily - 7 x weekly - 2 sets - 10 reps - Supine Butterfly Groin Stretch  - 1 x daily - 7 x weekly - 2 sets - hold - Supine Single Knee to Chest Stretch  - 1 x daily - 7 x weekly - 2 sets - hold - Supine Lower Trunk Rotation  - 1 x daily - 7 x weekly - 2 sets - 10 reps - Supine Figure 4 Piriformis Stretch  - 1 x daily - 7 x weekly - 2 sets - hold  ASSESSMENT:  CLINICAL IMPRESSION: Patient is a 68 y.o. F who was seen today for physical therapy treatment for right hip pain, pelvic pain and pain with intercourse as well as nocturia and fecal smearing. Treatment session focused on downtraining for the pelvic floor, specifically focusing on diaphragmatic breathing and stretching of the hips to decreased tension around the pelvic girdle. Pt found to have increased muscle tension in right hip compared to left today, which corresponds with her surgical side. She will benefit from PT to be able have pain free intercourse again, return to the gym, strength and reduce fecal smearing.  OBJECTIVE IMPAIRMENTS: decreased activity tolerance, decreased coordination, difficulty walking, decreased ROM, decreased strength, increased fascial restrictions, impaired tone, and pain.   ACTIVITY LIMITATIONS: continence, toileting, and intercourse  PARTICIPATION LIMITATIONS: interpersonal relationship, community activity, and community  PERSONAL FACTORS: Time since onset of injury/illness/exacerbation are also affecting patient's functional outcome.   REHAB POTENTIAL: Good  CLINICAL DECISION MAKING: Evolving/moderate complexity  EVALUATION COMPLEXITY: Moderate   GOALS: Goals reviewed with patient? Yes  SHORT TERM GOALS: Target date: 01/26/2024    Patient will be educated on Bowel routine Baseline: Goal status: INITIAL  2.  Educated on abdominal massage Baseline:  Goal status: INITIAL  3.  I with scar cupping Baseline:  Goal  status: INITIAL  4.  Urge suppression techniques and healthy bladder habits Baseline:  Goal status: INITIAL  LONG TERM GOALS: Target date: 03/22/2024    Patient will sleep through the night  Baseline: has to get up at least 2 times to urinate - can be 3-4 times Goal status: INITIAL  2.  Patient will have no pain with intercourse Baseline:  Goal status: INITIAL  3.  Patient will have no hip pain Baseline:  Goal  status: INITIAL  4.  Patient will have no fecal smearing Baseline:  Goal status: INITIAL  5.  Will be able to do workouts in the gym without hip pain Baseline:  Goal status: INITIAL  6.  Will have right hip MMT 5/5 Baseline:  Goal status: INITIAL  PLAN:  PT FREQUENCY: 1-2x/week  PT DURATION: 12 weeks  PLANNED INTERVENTIONS: 97110-Therapeutic exercises, 97530- Therapeutic activity, 97112- Neuromuscular re-education, 97535- Self Care, 02859- Manual therapy, [phone]- Electrical stimulation (manual), [phone]- Ionotophoresis 4mg /ml Dexamethasone , 79439 (1-2 muscles), 20561 (3+ muscles)- Dry Needling, Patient/Family education, Taping, Joint mobilization, Joint manipulation, Spinal manipulation, Spinal mobilization, Scar mobilization, Cryotherapy, Moist heat, and Biofeedback  PLAN FOR NEXT SESSION: ortho-cupping right hip scar, strengthening bilateral hips and core- bridges, leg press, review her gym exercises and load  Pelvic- abdominal massage and bowel/ bladder healthy habits  Celena JAYSON Domino, PT 01/16/2024, 4:15 PM  "

## 2024-01-16 NOTE — Telephone Encounter (Signed)
 Spoke with patient who ended up in ED 12/20. They weren't able to help shed light on patient's groin pain, but she does have moderate stool burden.  She takes Miralax  and has irregular and mushy BMs. Seeking advise to help with this. Recently started pelvic floor therapy. Hoping between that and better BM pattern, maybe the pain with resolve . Side note, Ortho says it is not due to the replacement but have given steroid shots with temp relief.

## 2024-01-17 ENCOUNTER — Other Ambulatory Visit (HOSPITAL_BASED_OUTPATIENT_CLINIC_OR_DEPARTMENT_OTHER): Payer: Self-pay

## 2024-01-17 ENCOUNTER — Encounter (HOSPITAL_BASED_OUTPATIENT_CLINIC_OR_DEPARTMENT_OTHER): Payer: Self-pay | Admitting: Obstetrics & Gynecology

## 2024-01-17 ENCOUNTER — Telehealth: Payer: Self-pay

## 2024-01-17 MED ORDER — FLUCONAZOLE 150 MG PO TABS: ORAL_TABLET | ORAL | 0 refills | Status: AC

## 2024-01-17 NOTE — Telephone Encounter (Signed)
 Transition Care Management Follow-up Telephone Call Date of discharge and from where: 01/14/24 [facility] How have you been since you were released from the hospital? [facility] Any questions or concerns? No  Items Reviewed: Did the pt receive and understand the discharge instructions provided? Yes  Medications obtained and verified? Yes  Other? No  Any new allergies since your discharge? No  Dietary orders reviewed? No Do you have support at home? Yes   Home Care and Equipment/Supplies: Were home health services ordered? not applicable If so, what is the name of the agency?   Has the agency set up a time to come to the patient's home? not applicable Were any new equipment or medical supplies ordered?  No What is the name of the medical supply agency?  Were you able to get the supplies/equipment? not applicable Do you have any questions related to the use of the equipment or supplies? No  Functional Questionnaire: (I = Independent and D = Dependent) ADLs: I  Bathing/Dressing- I  Meal Prep- I  Eating- I  Maintaining continence- I  Transferring/Ambulation- I  Managing Meds- I  Follow up appointments reviewed:  PCP Hospital f/u appt confirmed? Yes  Scheduled to see Corean Comment on 01/20/24 @ 3pm. Specialist [facility] f/u appt confirmed? No  Referral pending review for Urogynecology Are transportation arrangements needed? No  If their condition worsens, is the pt aware to call PCP or go to the Emergency Dept.? Yes Was the patient provided with contact information for the PCP's office or ED? Yes Was to pt encouraged to call back with questions or concerns? Yes

## 2024-01-20 ENCOUNTER — Ambulatory Visit (INDEPENDENT_AMBULATORY_CARE_PROVIDER_SITE_OTHER): Admitting: Family

## 2024-01-20 ENCOUNTER — Encounter: Payer: Self-pay | Admitting: Family

## 2024-01-20 VITALS — BP 120/84 | HR 54 | Temp 97.6°F | Ht 63.0 in | Wt 104.6 lb

## 2024-01-20 DIAGNOSIS — R102 Pelvic and perineal pain unspecified side: Secondary | ICD-10-CM

## 2024-01-20 LAB — POCT URINALYSIS DIPSTICK
Bilirubin, UA: NEGATIVE
Blood, UA: NEGATIVE
Glucose, UA: NEGATIVE
Ketones, UA: NEGATIVE
Leukocytes, UA: NEGATIVE
Nitrite, UA: NEGATIVE
Protein, UA: NEGATIVE
Spec Grav, UA: 1.025
Urobilinogen, UA: 0.2 U/dL
pH, UA: 6

## 2024-01-20 NOTE — Progress Notes (Signed)
 "  Patient ID: Patricia Ryan, female    DOB: 1955/06/24, 68 y.o.   MRN: [id number]  Chief Complaint  Patient presents with   Follow-up    Pt was seen in ED on 12/20 for abdominal pain/ groin pain. Has tried URO-MP, urine is blue colored due to RX.   Discussed the use of AI scribe software for clinical note transcription with the patient, who gave verbal consent to proceed.  History of Present Illness Patricia Ryan is a 68 year old female who presents with abdominal and pelvic pain.  She describes an intermittent real bad burn in the lower abdomen with pressure radiating into the rectum and vagina. She has started pelvic therapy with her gynecologist and has completed three sessions.  She recently finished antibiotics for an infection but feels it may not be fully resolved, with ongoing abdominal discomfort and occasional burning with urination. She was given methylene blue for urinary irritation but stopped it, and another urinary medication caused nausea. She has interstitial cystitis treated with oxybutynin . She previously tried amitriptyline  but stopped due to grogginess. She uses Xanax  mainly for sleep and avoids daily use. Her pelvic and abdominal pain can be severe at times and is unpredictable, sometimes occurring after meals. It is not present every day. She uses a heating pad and meloxicam  for relief.  Assessment & Plan Chronic pelvic pain due to interstitial cystitis Chronic pelvic pain with burning and pressure symptoms exacerbated recently. UA today is negative. Finished abt on Wednesday, but still having some burning and pelvic pain. Methylene blue was ineffective. Oxybutynin  continued. Meloxicam  provides partial relief but limited by potential hypertension risk. Referral to urogynecologist initiated. - Continue oxybutynin  daily. - Use meloxicam  as needed for pain.  - Follow up with Urology & urogynecologist for further evaluation and management. - Ensure adequate hydration,  aiming for half of her body weight in ounces of water  daily.   Subjective:    Outpatient Medications Prior to Visit  Medication Sig Dispense Refill   ALPRAZolam  (XANAX ) 0.25 MG tablet TAKE A HALF TO 1 TABLET BY MOUTH EVERY NIGHT AT BEDTIME ONLY AS NEEDED FOR SLEEP. LIMIT USE TO AVOID ADDICTION AND DEMENTIA 18 tablet 3   ascorbic acid (VITAMIN C) 500 MG tablet Take 250 mg by mouth every other day.     bimatoprost (LATISSE) 0.03 % ophthalmic solution Apply 1 drop to eye at bedtime.     CALCIUM  CITRATE PO Take 1 tablet by mouth daily. 600 +D     Cholecalciferol (VITAMIN D ) 50 MCG (2000 UT) tablet Take 2,000 Units by mouth daily.     Cyanocobalamin (B-12 PO) Take 500 mcg by mouth daily.     diltiazem  (CARDIZEM ) 30 MG tablet Take 1 tab twice a day as needed for palpitations 30 tablet 3   estradiol  (ESTRACE ) 0.01 % CREA vaginal cream USE A SMALL AMOUNT EXTERNALLY NIGHTLY 42.5 g 2   estradiol  (ESTRACE ) 0.5 MG tablet Take 0.5 tablets (0.25 mg total) by mouth every other day. 45 tablet 3   ezetimibe  (ZETIA ) 10 MG tablet Take 1 tablet (10 mg total) by mouth daily. 90 tablet 3   famotidine  (PEPCID ) 40 MG tablet Take 1 tablet (40 mg total) by mouth at bedtime. 90 tablet 3   fluconazole  (DIFLUCAN ) 150 MG tablet Take one tablet now. Repeat in 72 if symptoms persists. 2 tablet 0   hyoscyamine  (LEVSIN ) 0.125 MG tablet Take 1 tablet (0.125 mg total) by mouth every 4 (four) hours as needed for  cramping (diarrhea, nausea). 50 tablet 2   medroxyPROGESTERone  (PROVERA ) 2.5 MG tablet Take 1 tablet (2.5 mg total) by mouth daily. 30 tablet 12   meloxicam  (MOBIC ) 7.5 MG tablet Take 1 tablet (7.5 mg total) by mouth daily as needed for pain. 15 tablet 0   Meth-Hyo-M Bl-Na Phos-Ph Sal (URO-MP) 118 MG CAPS Take 1 capsule by mouth 4 (four) times daily.     oxybutynin  (DITROPAN -XL) 10 MG 24 hr tablet Take 10 mg by mouth at bedtime.     pantoprazole  (PROTONIX ) 40 MG tablet TAKE 1 TABLET BY MOUTH 2 TIMES A DAY BEFORE A MEAL  60 tablet 6   phenazopyridine  (PYRIDIUM ) 100 MG tablet Take 1 tablet (100 mg total) by mouth 3 (three) times daily as needed for pain (Take for 3 days only.). OK to take 1/2 tablet. 30 tablet 0   polyethylene glycol powder (GLYCOLAX /MIRALAX ) 17 GM/SCOOP powder as needed.     rosuvastatin  (CRESTOR ) 40 MG tablet Take 0.5 tablets (20 mg total) by mouth daily.     valACYclovir  (VALTREX ) 500 MG tablet Take 1 tablet (500 mg total) by mouth daily. 90 tablet 3   No facility-administered medications prior to visit.   Past Medical History:  Diagnosis Date   Allergy    Altered bowel habits 09/07/2019   ANAL FISSURE, HX OF 09/24/2008   Qualifier: Diagnosis of  By: Claudene CMA, Kelly     Anxiety    Anxiety state 09/24/2008   Qualifier: Diagnosis of   By: Claudene CMA, Burnard         Arthritis    Atypical chest pain 09/07/2019   Cancer (HCC)    skin cancer   Constipation 02/06/2010   Qualifier: Diagnosis of   By: Claudene CMA, Burnard         Fibroids    Uterine   GERD (gastroesophageal reflux disease)    Hepatitis    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   History of kidney stones    HSV-2 (herpes simplex virus 2) infection    Hyperlipidemia    IBS (irritable bowel syndrome)    Interstitial cystitis    Nevus    vulva nevus   Prediabetes    RUQ abdominal pain 09/07/2019   UTI (urinary tract infection) 05/23/2020   Past Surgical History:  Procedure Laterality Date   BLADDER REPAIR     BUNIONECTOMY     CHOLECYSTECTOMY     COLONOSCOPY     LAPAROSCOPY  1985/ 1990   due to infertility   LITHOTRIPSY     TONSILLECTOMY     TOTAL HIP ARTHROPLASTY Right 01/04/2022   Procedure: TOTAL HIP ARTHROPLASTY;  Surgeon: Edna Toribio LABOR, MD;  Location: [facility];  Service: Orthopedics;  Laterality: Right;   tubal reconstruction     tubalplasty  1985/ 1990    UPPER GASTROINTESTINAL ENDOSCOPY     VAGINAL DELIVERY     x2   Allergies[1]    Objective:    Physical Exam Vitals and nursing note  reviewed.  Constitutional:      Appearance: Normal appearance.  Cardiovascular:     Rate and Rhythm: Normal rate and regular rhythm.  Pulmonary:     Effort: Pulmonary effort is normal.     Breath sounds: Normal breath sounds.  Musculoskeletal:        General: Normal range of motion.  Skin:    General: Skin is warm and dry.  Neurological:     Mental Status: She is alert.  Psychiatric:        Mood and Affect: Mood normal.        Behavior: Behavior normal.    BP 120/84 (BP Location: Left Arm, Patient Position: Sitting, Cuff Size: Normal)   Pulse (!) 54   Temp 97.6 F (36.4 C) (Temporal)   Ht 5' 3 (1.6 m)   Wt 104 lb 9.6 oz (47.4 kg)   LMP 01/20/2011   SpO2 99%   BMI 18.53 kg/m  Wt Readings from Last 3 Encounters:  01/20/24 104 lb 9.6 oz (47.4 kg)  11/30/23 106 lb 6.4 oz (48.3 kg)  11/09/23 104 lb 9.6 oz (47.4 kg)      Murl Zogg, NP     [1]  Allergies Allergen Reactions   Codeine Nausea And Vomiting and Other (See Comments)    codeine   Sulfa  Antibiotics Hives, Rash, Shortness Of Breath and Other (See Comments)   Sulfur Hives and Other (See Comments)   Sulfamethoxazole -Trimethoprim  Nausea Only and Other (See Comments)   Doxycycline  Nausea Only   Macrobid  [Nitrofurantoin  Macrocrystal] Nausea Only   "

## 2024-01-23 ENCOUNTER — Ambulatory Visit: Admitting: Physical Therapy

## 2024-01-23 DIAGNOSIS — M6281 Muscle weakness (generalized): Secondary | ICD-10-CM

## 2024-01-23 DIAGNOSIS — R278 Other lack of coordination: Secondary | ICD-10-CM

## 2024-01-23 NOTE — Therapy (Signed)
 " OUTPATIENT PHYSICAL THERAPY FEMALE PELVIC TREATMENT   Patient Name: Patricia Ryan MRN: [id number] DOB:07/21/1955, 68 y.o., female Today's Date: 01/23/2024  END OF SESSION:  PT End of Session - 01/23/24 1529     Visit Number 4    Date for Recertification  03/28/24    Authorization Type [employer] approved 12 visits 12/29/2023 - 03/22/2024 [id number]  [facility] MCR 2025    Authorization Time Period 12/29/2023 - 03/22/2024    Authorization - Visit Number 4    Authorization - Number of Visits 12    Progress Note Due on Visit 10    PT Start Time 0245    PT Stop Time 0330    PT Time Calculation (min) 45 min    Activity Tolerance Patient tolerated treatment well    Behavior During Therapy [facility] for tasks assessed/performed             Past Medical History:  Diagnosis Date   Allergy    Altered bowel habits 09/07/2019   ANAL FISSURE, HX OF 09/24/2008   Qualifier: Diagnosis of  By: Claudene CMA, Burnard     Anxiety    Anxiety state 09/24/2008   Qualifier: Diagnosis of   By: Claudene CMA, Burnard         Arthritis    Atypical chest pain 09/07/2019   Cancer (HCC)    skin cancer   Constipation 02/06/2010   Qualifier: Diagnosis of   By: Claudene CMA, Burnard         Fibroids    Uterine   GERD (gastroesophageal reflux disease)    Hepatitis    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   History of kidney stones    HSV-2 (herpes simplex virus 2) infection    Hyperlipidemia    IBS (irritable bowel syndrome)    Interstitial cystitis    Nevus    vulva nevus   Prediabetes    RUQ abdominal pain 09/07/2019   UTI (urinary tract infection) 05/23/2020   Past Surgical History:  Procedure Laterality Date   BLADDER REPAIR     BUNIONECTOMY     CHOLECYSTECTOMY     COLONOSCOPY     LAPAROSCOPY  1985/ 1990   due to infertility   LITHOTRIPSY     TONSILLECTOMY     TOTAL HIP ARTHROPLASTY Right 01/04/2022   Procedure: TOTAL HIP ARTHROPLASTY;  Surgeon: Edna Toribio LABOR, MD;  Location: [facility];  Service:  Orthopedics;  Laterality: Right;   tubal reconstruction     tubalplasty  1985/ 1990    UPPER GASTROINTESTINAL ENDOSCOPY     VAGINAL DELIVERY     x2   Patient Active Problem List   Diagnosis Date Noted   History of hepatitis A 12/04/2023   Right groin pain 12/04/2023   Palpitations 07/25/2023   Emphysema lung (HCC) 06/10/2023   Atherosclerosis of aorta 06/10/2023   Pulmonary nodules 06/10/2023   Gastroesophageal reflux disease without esophagitis 04/20/2023   History of borderline diabetes mellitus 04/12/2023   Insomnia 04/12/2023   Hormone replacement therapy (HRT) 04/12/2023   Polyarthralgia 02/01/2021   Osteoporosis 10/21/2020   B12 deficiency 02/12/2019   Vitamin D  deficiency 02/12/2019   HSV-2 infection 01/28/2014   IBS (irritable bowel syndrome)    Hyperlipidemia    Fibroids    Chronic interstitial cystitis 11/08/2012    PCP: Lucius Krabbe, NP  REFERRING PROVIDER: Cleotilde Ronal RAMAN, MD  REFERRING DIAG:  N30.10 (ICD-10-CM) - Interstitial cystitis  R10.20 (ICD-10-CM) - Pelvic pain  R10.31 (  ICD-10-CM) - Right inguinal pain    THERAPY DIAG:  Muscle weakness (generalized)  Other lack of coordination  Rationale for Evaluation and Treatment: Rehabilitation  ONSET DATE: 12/23  SUBJECTIVE:                                                                                                                                                                                           SUBJECTIVE STATEMENT: Patient reports that she has been taking her miralax  but has not taken the dulcolax/miralax  combination she was recommended. She had bowel movements two days in a row (has not been today). Hip pain is still present but groin pain is much better. She has a urogynecologist appointment scheduled for January 9th in the new year. She has been getting up at least two times per night to get up to pee. 2/10 hip pain today.   Eval: Patient reports that she she she had a hip  replacement 2 years ago and it feels it still is not healed, incision very tender. Did PT for that. She has right groin pain and sometimes pelvic pressure in rectal and vaginal area at the same time. 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 Yes NPRS scale: 0-6/10 Pain location: right groin and vaginal pressure at times with it  Pain type: aching and burning Pain description: intermittent   Aggravating factors: intercourse, pain in the hips comes and goes Relieving factors: heat  PRECAUTIONS: None  RED FLAGS: None   WEIGHT BEARING RESTRICTIONS: No  FALLS:  Has patient fallen in last 6 months? No and Yes. Number of falls 2  OCCUPATION: retired  ACTIVITY LEVEL : active, goes to the gym  PLOF: Independent  PATIENT GOALS: to have less pain   BOWEL MOVEMENT: constipation and diarrhea, occasionally miralax  Pain with bowel movement: No Type of bowel movement:Type (Bristol Stool Scale) 2 Fully empty rectum:  No Leakage: Yes: sometimes                                                  Caused by: diarrhea Bowel urgency: not all the time Pads: Yes: sometimes pantyliner Fiber supplement/laxative Yes Miralax   URINATION: Pain with urination: Yes when she has  UTI Fully empty bladder: Yes:                                            Post-void dribble: No Stream: Strong Urgency: Yes  Frequency:during the day yes                                                        Nocturia: Yes: 3-4   Leakage: none Pads/briefs: No  INTERCOURSE:  Ability to have vaginal penetration No  Pain with intercourse: yes Dryness: yes Climax: no Marinoff Scale: 3/3 Lubricant:  PREGNANCY:  Vaginal deliveries 2 Tearing Yes:  3-4 degree Episiotomy yes C-section deliveries no Currently pregnant No  PROLAPSE: Pressure   OBJECTIVE:  Note: Objective measures were completed at Evaluation unless otherwise noted.  DIAGNOSTIC FINDINGS:  Post-void residual: Voiding Cystourethrogram (VCUG):  Ultrasound:   PATIENT SURVEYS:    PFIQ-7: 90 UIQ-7 33 CRAIG -7 29 POPIQ-7 29 Female Sexual Function Index (FSFI) Questionnaire   COGNITION: Overall cognitive status: Within functional limits for tasks assessed     SENSATION: Light touch: Appears intact  LUMBAR SPECIAL TESTS:  Straight leg raise test: Positive on right for weakness right hip  FUNCTIONAL TESTS: to be checked  Single leg stance:  Rt:  Lt: Sit-up test: Squat: Bed mobility:  GAIT: Assistive device utilized: None Comments: mildly antalgic  POSTURE: No Significant postural limitations   LUMBARAROM/PROM:  A/PROM A/PROM  Eval (% available)  Flexion   Extension   Right lateral flexion   Left lateral flexion   Right rotation   Left rotation    (Blank rows = not tested)  LOWER EXTREMITY ROM: full   LOWER EXTREMITY FFU:mphyu hip 4-/5  PALPATION:  General: right hip scar tender  Pelvic Alignment: even  Abdominal:   Diastasis: No Distortion: No  Breathing: upper chest Scar tissue: Yes: right hip Active Straight Leg Raise: right- positive for weakness                External Perineal Exam: mild dryness                             Internal Pelvic Floor: anterior and posterior vaginal wall laxity, unable to  bulge vaginally, able to rectally  Patient confirms identification and approves PT to assess internal pelvic floor and treatment Yes All internal or external pelvic floor assessments and/or treatments are completed with proper hand hygiene and gloves hands. If needed gloves are changed with hand hygiene during patient care time.  PELVIC MMT:   MMT eval  Vaginal 2/5, unable to bulge  Internal Anal Sphincter 3/5  External Anal Sphincter 3/5  Puborectalis   (Blank rows = not tested)  TONE: average  PROLAPSE: Anterior and posterior vaginal wall laxity with stool in rectum  TODAY'S TREATMENT:                                                                                                                              DATE: 01/23/24: Vibration plate for pelvic floor relaxation: Standing with soft bend in knees + diaphragmatic breathing x19min  Standing adductor stretch + diaphragmatic breathing x89min  Standing hamstring stretch + diaphragmatic breathing x1min  Single knee to chest stretch + diaphragmatic breathing 2x50min  Sidelying open book + diaphragmatic breathing 2x42min  Cat/cow + diaphragmatic breathing 2x32min  Hip flexor at edge of bed stretch + diaphragmatic breathing 2x39min  Lower trunk rotations + diaphragmatic breathing x72min  01/16/24: Supine diaphragmatic breathing + pelvic floor relaxation 2x10  Supine butterfly stretch + diaphragmatic breathing 2x31min  Single knee to chest + diaphragmatic breathing 2x108min  Lower trunk rotation + diaphragmatic breathing 2x10  Figure 4 piriformis stretch + diaphragmatic breathing 2x74min  01/05/2024 Education on fiber ( kiwi), hydration,  Going to try poop within 30 mins for 5 mins with gentle bearing  Right Hip scar cupping Abdominal massage for constipation Education on diaphragmatic breathing   12/29/23 EVAL  Examination completed, findings reviewed, pt educated on POC, HEP, and female pelvic floor anatomy, reasoning with  pelvic floor assessment internally with pt consent. Pt motivated to participate in PT and agreeable to attempt recommendations.    PATIENT EDUCATION:  Education details: Pt was educated on relevant anatomy, exam findings, home exercise program, plan of care, expectations of PT and hip scar cupping  Person educated: Patient Education method: Explanation, Demonstration, Tactile cues, Verbal cues, and Handouts Education comprehension: verbalized understanding, returned demonstration, verbal cues required, tactile cues required, and needs further education  HOME EXERCISE PROGRAM: Access Code: [email or web address] URL: [email or web address] Date: 01/23/2024 Prepared by: Celena Domino  Exercises - Seated Diaphragmatic Breathing  - 1 x daily - 7 x weekly - 2 sets - 10 reps - Supine Butterfly Groin Stretch  - 1 x daily - 7 x weekly - 2 sets - hold - Supine Single Knee to Chest Stretch  - 1 x daily - 7 x weekly - 2 sets - hold - Supine Lower Trunk Rotation  - 1 x daily - 7 x weekly - 2 sets - 10 reps - Supine Figure 4 Piriformis Stretch  - 1 x daily - 7 x weekly - 2 sets - hold - Sidelying Thoracic Rotation with Open Book  - 1 x daily - 7 x weekly - 2 sets - 10 reps - Cat Cow  - 1 x daily - 7 x weekly - 2 sets - 10 reps - Hip Flexor Stretch at Edge of Bed  - 1 x daily - 7 x weekly - 2 sets - hold  ASSESSMENT:  CLINICAL IMPRESSION: Patient is a 68 y.o. F who was seen today for physical therapy treatment for  right hip pain, pelvic pain and pain with intercourse as well as nocturia and fecal smearing. Treatment session focused on downtraining for the pelvic floor, specifically focusing on diaphragmatic breathing and stretching of the hips to decreased tension around the pelvic girdle. Pt found vibration plate to be tolerable and she reports feeling increased blood flow to the right hip after vibration plate stretching. Diaphragmatic breathing continued to be implemented across HEP  to promote pelvic floor relaxation. She will benefit from PT to be able have pain free intercourse again, return to the gym, strength and reduce fecal smearing.  OBJECTIVE IMPAIRMENTS: decreased activity tolerance, decreased coordination, difficulty walking, decreased ROM, decreased strength, increased fascial restrictions, impaired tone, and pain.   ACTIVITY LIMITATIONS: continence, toileting, and intercourse  PARTICIPATION LIMITATIONS: interpersonal relationship, community activity, and community  PERSONAL FACTORS: Time since onset of injury/illness/exacerbation are also affecting patient's functional outcome.   REHAB POTENTIAL: Good  CLINICAL DECISION MAKING: Evolving/moderate complexity  EVALUATION COMPLEXITY: Moderate   GOALS: Goals reviewed with patient? Yes  SHORT TERM GOALS: Target date: 01/26/2024    Patient will be educated on Bowel routine Baseline: Goal status: INITIAL  2.  Educated on abdominal massage Baseline:  Goal status: INITIAL  3.  I with scar cupping Baseline:  Goal status: INITIAL  4.  Urge suppression techniques and healthy bladder habits Baseline:  Goal status: INITIAL  LONG TERM GOALS: Target date: 03/22/2024    Patient will sleep through the night  Baseline: has to get up at least 2 times to urinate - can be 3-4 times Goal status: INITIAL  2.  Patient will have no pain with intercourse Baseline:  Goal status: INITIAL  3.  Patient will have no hip pain Baseline:  Goal status: INITIAL  4.  Patient will have no fecal smearing Baseline:  Goal status: INITIAL  5.  Will be able to do workouts in the gym without hip pain Baseline:  Goal status: INITIAL  6.  Will have right hip MMT 5/5 Baseline:  Goal status: INITIAL  PLAN:  PT FREQUENCY: 1-2x/week  PT DURATION: 12 weeks  PLANNED INTERVENTIONS: 97110-Therapeutic exercises, 97530- Therapeutic activity, 97112- Neuromuscular re-education, 97535- Self Care, [id number]- Manual therapy,  [phone]- Electrical stimulation (manual), [phone]- Ionotophoresis 4mg /ml Dexamethasone , 79439 (1-2 muscles), 20561 (3+ muscles)- Dry Needling, Patient/Family education, Taping, Joint mobilization, Joint manipulation, Spinal manipulation, Spinal mobilization, Scar mobilization, Cryotherapy, Moist heat, and Biofeedback  PLAN FOR NEXT SESSION: ortho-cupping right hip scar, strengthening bilateral hips and core- bridges, leg press, review her gym exercises and load  Pelvic- abdominal massage and bowel/ bladder healthy habits  Celena JAYSON Domino, PT 01/23/2024, 3:29 PM  "

## 2024-02-01 ENCOUNTER — Ambulatory Visit: Admitting: Physical Therapy

## 2024-02-03 ENCOUNTER — Encounter: Payer: Self-pay | Admitting: Obstetrics and Gynecology

## 2024-02-03 ENCOUNTER — Ambulatory Visit: Admitting: Obstetrics and Gynecology

## 2024-02-03 VITALS — BP 113/68 | HR 77 | Ht 63.0 in | Wt 103.2 lb

## 2024-02-03 DIAGNOSIS — Z87440 Personal history of urinary (tract) infections: Secondary | ICD-10-CM

## 2024-02-03 DIAGNOSIS — N3281 Overactive bladder: Secondary | ICD-10-CM | POA: Insufficient documentation

## 2024-02-03 DIAGNOSIS — M25551 Pain in right hip: Secondary | ICD-10-CM | POA: Insufficient documentation

## 2024-02-03 DIAGNOSIS — R300 Dysuria: Secondary | ICD-10-CM

## 2024-02-03 DIAGNOSIS — R350 Frequency of micturition: Secondary | ICD-10-CM | POA: Diagnosis not present

## 2024-02-03 DIAGNOSIS — N3010 Interstitial cystitis (chronic) without hematuria: Secondary | ICD-10-CM | POA: Diagnosis not present

## 2024-02-03 DIAGNOSIS — N390 Urinary tract infection, site not specified: Secondary | ICD-10-CM

## 2024-02-03 LAB — POCT URINALYSIS DIP (CLINITEK)
Bilirubin, UA: NEGATIVE
Blood, UA: NEGATIVE
Glucose, UA: NEGATIVE mg/dL
Nitrite, UA: POSITIVE — AB
POC PROTEIN,UA: NEGATIVE
Spec Grav, UA: 1.015
Urobilinogen, UA: 0.2 U/dL
pH, UA: 7.5

## 2024-02-03 MED ORDER — TROSPIUM CHLORIDE ER 60 MG PO CP24: 1.0000 | ORAL_CAPSULE | Freq: Every day | ORAL | 5 refills | Status: AC

## 2024-02-03 NOTE — Progress Notes (Signed)
 " New Patient Evaluation and Consultation  Referring Provider: Cleotilde Ronal RAMAN, MD PCP: Lucius Krabbe, NP Date of Service: 02/03/2024  SUBJECTIVE Chief Complaint: New Patient (Initial Visit) Patricia Ryan is a 69 y.o. female is for Interstitial cystitis)  History of Present Illness: Patricia Ryan is a 68 y.o. White or Caucasian female seen in consultation at the request of Dr Cleotilde for evaluation of interstitial cystitis and recurrent UTI.     Urinary Symptoms: Leaks urine with with a full bladder Does not leak every day Pad use: 1-2 liners/ mini-pads per day.   Patient is bothered by UI symptoms. Has been on oxybutynin  for several years. Has not tried other medications.  History of a sling- 2011 [facility]- placed by Dr Jacqlyn)  Day time voids 5+.  Nocturia: 2-3 times per night to void. Voiding dysfunction:  empties bladder well.  Patient does not use a catheter to empty bladder.  When urinating, patient feels difficulty starting urine stream and dribbling after finishing Drinks: 1 cup coffee in AM, 2-3 16oz bottles water  per day  UTIs: reports 10 UTI's in the last year.    Urine cultures from [facility]:  01/12/24- 100,000 Klebsiella pneumoniae, resistant to ampicillin 10/25/23- 60-100,000 klebsiella pneumoniae, resistant to ampicillin 08/10/23- no pathogens 07/19/23- 60-100,000 E.Coli, resistant to ampicillin, ciprofloxacin , piperacillin/ tazobactam  Pt reports she had urine cultures done at other urgent cares but does not have culture results available  Reports history of kidney or bladder stones She has taken Uro-MP, and methenamine. But the methenamine bothered her stomach. Has not been on daily antibiotic prophylaxis.  Symptoms include discomfort in her lower abdomen, burning with urination, sensation at the end of the urethra, and urinary frequency.  She is using vaginal estrogen cream about once a week   Pelvic Organ Prolapse Symptoms:                  Patient  Denies a feeling of a bulge the vaginal area.  Bowel Symptom: Bowel movements: 2-3 time(s) per week Stool consistency: soft  Straining: yes.  Splinting: no.  Incomplete evacuation: yes.  Patient Denies accidental bowel leakage / fecal incontinence Bowel regimen: miralax  daily  HM Colonoscopy          Upcoming     Colonoscopy (Every 7 Years) Next due on 09/22/2029    09/23/2022  COLONOSCOPY  Only the first 1 history entries have been loaded, but more history exists.               Sexual Function Sexually active: no.  Pain with sex: Yes, deep in the pelvis, has discomfort due to prolapse, has discomfort due to dryness  Pelvic Pain Admits to pain Location: lower abdomen and right groin Pain occurs: unexpectedly Prior pain treatment: pelvic PT- she feels like the exercises she is getting is making her hurt, so she has stopped doing some of the exercises.  Improved by: heating pad, meloxicam  Worsened by: activity  Has hip replacement on the right and did not have pain until after this. She reports she gets injections of steroid for bursitis.   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   History of kidney stones    HSV-2 (herpes simplex virus 2) infection    Hyperlipidemia    IBS (irritable bowel syndrome)    Interstitial cystitis    Nevus    vulva nevus   Prediabetes    RUQ abdominal pain 09/07/2019   UTI (urinary tract infection)  05/23/2020     Past Surgical History:   Past Surgical History:  Procedure Laterality Date   BUNIONECTOMY     CHOLECYSTECTOMY     COLONOSCOPY     LAPAROSCOPY  1985/ 1990   due to infertility   LITHOTRIPSY     RETROPUBIC SLING  March 01, 2009   [facility]   TONSILLECTOMY     TOTAL HIP ARTHROPLASTY Right 01/04/2022   Procedure: TOTAL HIP ARTHROPLASTY;  Surgeon: Edna Toribio LABOR, MD;  Location: [facility];  Service: Orthopedics;  Laterality: Right;   tubal reconstruction     tubalplasty  1985/ 1990    UPPER GASTROINTESTINAL ENDOSCOPY     VAGINAL DELIVERY     x2     Past OB/GYN History: OB History  Gravida Para Term Preterm AB Living  2 2 2   1   SAB IAB Ectopic Multiple Live Births          # Outcome Date GA Lbr Len/2nd Weight Sex Type Anes PTL Lv  2 Term Mar 01, 1993     Vag-Spont     1 Term 10/1989     Vag-Spont       Obstetric Comments  1 child deceased 2011-03-02   Menopausal: Denies vaginal bleeding since menopause Any history of abnormal pap smears: yes- history of LEEP    Component Value Date/Time   DIAGPAP  07/25/2023 1523    - Negative for intraepithelial lesion or malignancy (NILM)   DIAGPAP - Atrophic pattern with epithelial atypia (A) 04/21/2023 1506   DIAGPAP  10/21/2022 1405    - Negative for intraepithelial lesion or malignancy (NILM)   HPVHIGH Negative 04/21/2023 1506   HPVHIGH Negative 10/21/2022 1405   HPVHIGH Positive (A) 02/08/2022 1512   ADEQPAP  07/25/2023 1523    Satisfactory for evaluation; transformation zone component PRESENT.   ADEQPAP  04/21/2023 1506    Satisfactory for evaluation; transformation zone component PRESENT.   ADEQPAP  10/21/2022 1405    Satisfactory for evaluation; transformation zone component PRESENT.    Medications: Patient has a current medication list which includes the following prescription(s): alprazolam , ascorbic acid, bimatoprost, calcium  citrate, vitamin d , cyanocobalamin, diltiazem , estradiol , estradiol , ezetimibe , famotidine ,  fluconazole , hyoscyamine , medroxyprogesterone , meloxicam , uro-mp, pantoprazole , phenazopyridine , polyethylene glycol powder, rosuvastatin , trospium  chloride, and valacyclovir .   Allergies: Patient is allergic to codeine, sulfa  antibiotics, sulfur, sulfamethoxazole -trimethoprim , doxycycline , and macrobid  [nitrofurantoin  macrocrystal].   Social History: Social History[1]  Relationship status: married Patient lives with husband.   Patient is not employed. Regular exercise: No History of abuse: No  Family History:   Family History  Problem Relation Age of Onset   Celiac disease Mother    Heart disease Father    Cancer Father        biospy to be done 12/20/2022   Irritable bowel syndrome  Sister    Thyroid  disease Sister    Cancer Sister 9       Melanoma   Heart disease Sister    Pancreatic cancer Maternal Grandmother    Lung cancer Maternal Grandmother    Diabetes Paternal Grandmother    Breast cancer Paternal Grandmother    Kidney disease Other        tumor   Colon cancer Neg Hx    Esophageal cancer Neg Hx    Rectal cancer Neg Hx    Stomach cancer Neg Hx      Review of Systems: Review of Systems  Constitutional:  Positive for malaise/fatigue. Negative for fever and weight loss.  Respiratory:  Negative for cough, shortness of breath and wheezing.   Cardiovascular:  Positive for palpitations. Negative for chest pain and leg swelling.  Gastrointestinal:  Positive for abdominal pain. Negative for blood in stool.  Genitourinary:  Positive for dysuria.  Musculoskeletal:  Positive for myalgias.  Skin:  Negative for rash.  Neurological:  Negative for dizziness and headaches.  Endo/Heme/Allergies:  Does not bruise/bleed easily.  Psychiatric/Behavioral:  Positive for depression. The patient is nervous/anxious.      OBJECTIVE Physical Exam: Vitals:   02/03/24 0925  BP: 113/68  Pulse: 77  Weight: 103 lb 3.2 oz (46.8 kg)  Height: 5' 3 (1.6 m)    Physical Exam Vitals  reviewed. Exam conducted with a chaperone present.  Constitutional:      General: She is not in acute distress. Pulmonary:     Effort: Pulmonary effort is normal.  Abdominal:     General: There is no distension.     Palpations: Abdomen is soft.     Tenderness: There is no abdominal tenderness. There is no rebound.  Musculoskeletal:        General: No swelling. Normal range of motion.  Skin:    General: Skin is warm and dry.     Findings: No rash.  Neurological:     Mental Status: She is alert and oriented to person, place, and time.  Psychiatric:        Mood and Affect: Mood normal.        Behavior: Behavior normal.      GU / Detailed Urogynecologic Evaluation:  Pelvic Exam: Normal external female genitalia; Bartholin's and Skene's glands normal in appearance; urethral meatus normal in appearance, no urethral masses or discharge.   CST: negative  Speculum exam reveals normal vaginal mucosa with atrophy. Cervix normal appearance. Uterus normal single, nontender. Adnexa no mass, fullness, tenderness.     Pelvic floor strength I/V  Pelvic floor musculature: Right levator non-tender, Right obturator non-tender, Left levator non-tender, Left obturator non-tender  POP-Q:   POP-Q  -3                                            Aa   -3                                           Ba  -7  C   3                                            Gh  4                                            Pb  8                                            tvl   -3                                            Ap  -3                                            Bp  -7                                              D      Rectal Exam:  deferred  Post-Void Residual (PVR) by Bladder Scan: In order to evaluate bladder emptying, we discussed obtaining a postvoid residual and patient agreed to this procedure.  Procedure: The ultrasound unit was placed on  the patient's abdomen in the suprapubic region after the patient had voided.    Post Void Residual - 02/03/24 0939       Post Void Residual   Post Void Residual 19 mL           Laboratory Results: Lab Results  Component Value Date   COLORU yellow 02/03/2024   CLARITYU clear 02/03/2024   GLUCOSEUR negative 02/03/2024   BILIRUBINUR negative 02/03/2024   KETONESU Negative 01/20/2024   SPECGRAV 1.015 02/03/2024   RBCUR negative 02/03/2024   PHUR 7.5 02/03/2024   PROTEINUR Negative 01/20/2024   UROBILINOGEN 0.2 02/03/2024   LEUKOCYTESUR Trace (A) 02/03/2024    Lab Results  Component Value Date   CREATININE 0.54 01/14/2024   CREATININE 0.53 04/19/2023   CREATININE 0.71 08/12/2022    Lab Results  Component Value Date   HGBA1C 5.6 02/12/2022    Lab Results  Component Value Date   HGB 14.0 01/14/2024     ASSESSMENT AND PLAN Ms. Canner is a 69 y.o. with:  1. Recurrent urinary tract infection   2. Dysuria   3. Overactive bladder   4. Chronic interstitial cystitis   5. Urinary frequency   6. Pain of right hip     Recurrent urinary tract infection Assessment & Plan: - For treatment of recurrent urinary tract infections, we discussed management of recurrent UTIs including prophylaxis with a daily low dose antibiotic, transvaginal estrogen therapy,  and cranberry supplements.   - She recently started on vaginal estrogen cream. Advised to use twice a week. Will defer antibiotics at this time.  - Will send  her urine today for molecular testing.  - Also recommend office cystoscopy to assess for any issues with prior mesh sling   Dysuria -     [facility] Molecular Test  Overactive bladder Assessment & Plan: - Has been on oxybutynin  for several years and does not see much benefit.  - Will change to Trospium  60mg  ER.  Orders: -     Trospium  Chloride ER; Take 1 capsule (60 mg total) by mouth daily.  Dispense: 30 capsule; Refill: 5  Chronic interstitial  cystitis Assessment & Plan: - For irritative bladder we reviewed treatment options including altering her diet to avoid irritative beverages and foods as well as attempting to decrease stress and other exacerbating factors.  We also discussed using pyridium / Uro MP and similar over-the-counter medications for pain relief as needed. - Will reassess need for other treatment once UTIs are controlled   Urinary frequency -     POCT URINALYSIS DIP (CLINITEK)  Pain of right hip Assessment & Plan: - No pelvic pathology that is cause of pain. Has chronic arthritis and bursitis for hip. Pt receiving steroid injections by ortho but wants other treatment options. Will refer to PM&R  Orders: -     Ambulatory referral to Physical Medicine Rehab   Return for cystoscopy  Rosaline LOISE Caper, MD        [1]  Social History Tobacco Use   Smoking status: Former    Current packs/day: 0.00    Types: Cigarettes    Quit date: 12/26/2011    Years since quitting: 12.1    Passive exposure: Past   Smokeless tobacco: Never  Vaping Use   Vaping status: Never Used  Substance Use Topics   Alcohol  use: Yes    Alcohol /week: 5.0 standard drinks of alcohol     Types: 5 Glasses of wine per week    Comment: socially 2 times per week   Drug use: No   "

## 2024-02-03 NOTE — Patient Instructions (Addendum)
 Today we talked about ways to manage bladder urgency such as altering your diet to avoid irritative beverages and foods (bladder diet) as well as attempting to decrease stress and other exacerbating factors.    The Most Bothersome Foods* The Least Bothersome Foods*  Coffee - Regular & Decaf Tea - caffeinated Carbonated beverages - cola, non-colas, diet & caffeine-free Alcohols - Beer, Red Wine, White Wine, [address] - Grapefruit, [place], Orange, [employer] - Cranberry, Grapefruit, Orange, Pineapple Vegetables - Tomato & Tomato Products Flavor Enhancers - Hot peppers, Spicy foods, Chili, Horseradish, Vinegar, Monosodium glutamate (MSG) Artificial Sweeteners - NutraSweet, Sweet 'N Low, Equal (sweetener), Saccharin Ethnic foods - Mexican, Thai, Indian food Water  Milk - low-fat & whole Fruits - Bananas, Blueberries, Honeydew melon, Pears, Raisins, Watermelon Vegetables - Broccoli, [address] Sprouts, [place], [place], Cauliflower, [place], Cucumber, Mushrooms, Peas, Radishes, Squash, Zucchini, White potatoes, Sweet potatoes & yams Poultry - Chicken, Eggs, Turkey, [employer] - Beef, [employer], Lamb Seafood - Shrimp, [place] fish, Salmon Grains - Oat, Rice Snacks - Pretzels, Popcorn  *Mitch ALF et al. Diet and its role in interstitial cystitis/bladder pain syndrome (IC/BPS) and comorbid conditions. [employer]. BJU Int. 2012 Jan 11.   Constipation: Our goal is to achieve formed bowel movements daily or every-other-day.  You may need to try different combinations of the following options to find what works best for you - everybody's body works differently so feel free to adjust the dosages as needed.  Some options to help maintain bowel health include:  Dietary changes (more leafy greens, vegetables and fruits; less processed foods) Fiber supplementation (Benefiber, FiberCon, Metamucil or Psyllium). Start slow and increase gradually to full dose. Over-the-counter agents such as: stool  softeners (Docusate or Colace) and/or laxatives (Miralax , milk of magnesia)  Power Pudding is a natural mixture that may help your constipation.  To make blend 1 cup applesauce, 1 cup wheat bran, and 3/4 cup prune juice, refrigerate and then take 1 tablespoon daily with a large glass of water  as needed.  Interstitial Cystitis/ Bladder Pain treatment:  Avoid irriative foods and beverages and identify other triggers Work to decrease stress- meditation, yoga Medications for bladder pain: pyridium  (Azo), methenamine, Uribel (uro-MP) Medications: amitiptyline, hydroxyzine (anti-histamine), tums, L-arginine, [place]  Bladder instillations for pain flares Cystoscopy with hydrodistention  The IC Network at https://www.ic-network.com is helpful for bladder diet suggestions and forums for support.

## 2024-02-03 NOTE — Assessment & Plan Note (Signed)
-   For irritative bladder we reviewed treatment options including altering her diet to avoid irritative beverages and foods as well as attempting to decrease stress and other exacerbating factors.  We also discussed using pyridium / Uro MP and similar over-the-counter medications for pain relief as needed. - Will reassess need for other treatment once UTIs are controlled

## 2024-02-03 NOTE — Assessment & Plan Note (Signed)
-   Has been on oxybutynin  for several years and does not see much benefit.  - Will change to Trospium  60mg  ER.

## 2024-02-03 NOTE — Assessment & Plan Note (Signed)
-   No pelvic pathology that is cause of pain. Has chronic arthritis and bursitis for hip. Pt receiving steroid injections by ortho but wants other treatment options. Will refer to PM&R

## 2024-02-03 NOTE — Assessment & Plan Note (Signed)
-   For treatment of recurrent urinary tract infections, we discussed management of recurrent UTIs including prophylaxis with a daily low dose antibiotic, transvaginal estrogen therapy,  and cranberry supplements.   - She recently started on vaginal estrogen cream. Advised to use twice a week. Will defer antibiotics at this time.  - Will send her urine today for molecular testing.  - Also recommend office cystoscopy to assess for any issues with prior mesh sling

## 2024-02-05 NOTE — Therapy (Signed)
 " OUTPATIENT PHYSICAL THERAPY FEMALE PELVIC TREATMENT   Patient Name: Patricia Ryan MRN: [id number] DOB:May 18, 1955, 69 y.o., female Today's Date: 02/06/2024  END OF SESSION:  PT End of Session - 02/06/24 1014     Visit Number 5    Date for Recertification  03/28/24    Authorization Type [employer] approved 12 visits 12/29/2023 - 03/22/2024 [id number]  [facility] MCR 2025    Authorization Time Period 12/29/2023 - 03/22/2024    Authorization - Visit Number 5    Authorization - Number of Visits 12    Progress Note Due on Visit 10    PT Start Time 0930    PT Stop Time 1020    PT Time Calculation (min) 50 min    Activity Tolerance Patient tolerated treatment well    Behavior During Therapy [facility] for tasks assessed/performed              Past Medical History:  Diagnosis Date   Allergy    Altered bowel habits 09/07/2019   ANAL FISSURE, HX OF 09/24/2008   Qualifier: Diagnosis of  By: Claudene CMA, Burnard     Anxiety    Anxiety state 09/24/2008   Qualifier: Diagnosis of   By: Claudene CMA, Burnard         Arthritis    Atypical chest pain 09/07/2019   Cancer (HCC)    skin cancer   Constipation 02/06/2010   Qualifier: Diagnosis of   By: Claudene CMA, Burnard         Fibroids    Uterine   GERD (gastroesophageal reflux disease)    Hepatitis    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   History of kidney stones    HSV-2 (herpes simplex virus 2) infection    Hyperlipidemia    IBS (irritable bowel syndrome)    Interstitial cystitis    Nevus    vulva nevus   Prediabetes    RUQ abdominal pain 09/07/2019   UTI (urinary tract infection) 05/23/2020   Past Surgical History:  Procedure Laterality Date   BUNIONECTOMY     CHOLECYSTECTOMY     COLONOSCOPY     LAPAROSCOPY  1985/ 1990   due to infertility   LITHOTRIPSY     RETROPUBIC SLING  2011   [facility]   TONSILLECTOMY     TOTAL HIP ARTHROPLASTY Right 01/04/2022   Procedure: TOTAL HIP ARTHROPLASTY;  Surgeon: Edna Toribio LABOR, MD;  Location:  [facility];  Service: Orthopedics;  Laterality: Right;   tubal reconstruction     tubalplasty  1985/ 1990    UPPER GASTROINTESTINAL ENDOSCOPY     VAGINAL DELIVERY     x2   Patient Active Problem List   Diagnosis Date Noted   Overactive bladder 02/03/2024   Pain of right hip 02/03/2024   History of hepatitis A 12/04/2023   Right groin pain 12/04/2023   Palpitations 07/25/2023   Emphysema lung (HCC) 06/10/2023   Atherosclerosis of aorta 06/10/2023   Pulmonary nodules 06/10/2023   Gastroesophageal reflux disease without esophagitis 04/20/2023   History of borderline diabetes mellitus 04/12/2023   Insomnia 04/12/2023   Hormone replacement therapy (HRT) 04/12/2023   Polyarthralgia 02/01/2021   Osteoporosis 10/21/2020   Recurrent urinary tract infection 05/23/2020   B12 deficiency 02/12/2019   Vitamin D  deficiency 02/12/2019   HSV-2 infection 01/28/2014   IBS (irritable bowel syndrome)    Hyperlipidemia    Fibroids    Chronic interstitial cystitis 11/08/2012    PCP: Lucius Krabbe, NP  REFERRING PROVIDER: Cleotilde Ronal RAMAN, MD  REFERRING DIAG:  N30.10 (ICD-10-CM) - Interstitial cystitis  R10.20 (ICD-10-CM) - Pelvic pain  R10.31 (ICD-10-CM) - Right inguinal pain    THERAPY DIAG:  Muscle weakness (generalized)  Other lack of coordination  Rationale for Evaluation and Treatment: Rehabilitation  ONSET DATE: 12/23  SUBJECTIVE:                                                                                                                                                                                           SUBJECTIVE STATEMENT: When I started doing a bunch of exercises, it started to make my right hip hurt. Wondering if its gluteal tendinopathy  Saw a urogyne on Friday, she did an internal, did a catheterization, she is figuring out what is going on.  I have an ic She is going to put a camera in down in the bladder Hip is typically 5/10, I am still suffering but I  kind of forget I want to go in the gym ( [employer] at least 2x) I don't go for walks I do a lot of chores         Patient reports that she has been taking her miralax  but has not taken the dulcolax/miralax  combination she was recommended. She had bowel movements two days in a row (has not been today). Hip pain is still present but groin pain is much better. She has a urogynecologist appointment scheduled for January 9th in the new year. She has been getting up at least two times per night to get up to pee. 2/10 hip pain today.   Eval: Patient reports that she she she had a hip replacement 2 years ago and it feels it still is not healed, incision very tender. Did PT for that. She has right groin pain and sometimes pelvic pressure in rectal and vaginal area at the same time. 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 Yes NPRS scale: 0-6/10 Pain location: right groin and vaginal pressure at times with it  Pain type: aching and burning Pain description: intermittent   Aggravating factors: intercourse, pain in the hips comes and goes Relieving factors: heat  PRECAUTIONS: None  RED FLAGS: None   WEIGHT BEARING RESTRICTIONS: No  FALLS:  Has patient fallen in last 6 months? No and Yes.  Number of falls 2  OCCUPATION: retired  ACTIVITY LEVEL : active, goes to the gym  PLOF: Independent  PATIENT GOALS: to have less pain   BOWEL MOVEMENT: constipation and diarrhea, occasionally miralax  Pain with bowel movement: No Type of bowel movement:Type (Bristol Stool Scale) 2 Fully empty rectum: No Leakage: Yes: sometimes                                                  Caused by: diarrhea Bowel urgency: not all the time Pads: Yes: sometimes pantyliner Fiber supplement/laxative Yes Miralax   URINATION: Pain with urination: Yes when she has  UTI Fully empty bladder: Yes:                                           Post-void dribble: No Stream: Strong Urgency: Yes  Frequency:during the day yes                                                        Nocturia: Yes: 3-4   Leakage: none Pads/briefs: No  INTERCOURSE:  Ability to have vaginal penetration No  Pain with intercourse: yes Dryness: yes Climax: no Marinoff Scale: 3/3 Lubricant:  PREGNANCY:  Vaginal deliveries 2 Tearing Yes:  3-4 degree Episiotomy yes C-section deliveries no Currently pregnant No  PROLAPSE: Pressure   OBJECTIVE:  Note: Objective measures were completed at Evaluation unless otherwise noted.  DIAGNOSTIC FINDINGS:  Post-void residual: Voiding Cystourethrogram (VCUG):  Ultrasound:   PATIENT SURVEYS:    PFIQ-7: 90 UIQ-7 33 CRAIG -7 29 POPIQ-7 29 Female Sexual Function Index (FSFI) Questionnaire   COGNITION: Overall cognitive status: Within functional limits for tasks assessed     SENSATION: Light touch: Appears intact  LUMBAR SPECIAL TESTS:  Straight leg raise test: Positive on right for weakness right hip  FUNCTIONAL TESTS: to be checked  Single leg stance:  Rt:  Lt: Sit-up test: Squat: Bed mobility:  GAIT: Assistive device utilized: None Comments: mildly antalgic  POSTURE: No Significant postural limitations   LUMBARAROM/PROM:  A/PROM A/PROM  Eval (%  available)  Flexion   Extension   Right lateral flexion   Left lateral flexion   Right rotation   Left rotation    (Blank rows = not tested)  LOWER EXTREMITY ROM: full   LOWER EXTREMITY FFU:mphyu hip 4-/5  PALPATION:  General: right hip scar tender  Pelvic Alignment: even  Abdominal:   Diastasis: No Distortion: No  Breathing: upper chest Scar tissue: Yes: right hip Active Straight Leg Raise: right- positive for weakness                External  Perineal Exam: mild dryness                             Internal Pelvic Floor: anterior and posterior vaginal wall laxity, unable to bulge vaginally, able to rectally  Patient confirms identification and approves PT to assess internal pelvic floor and treatment Yes All internal or external pelvic floor assessments and/or treatments are completed with proper hand hygiene and gloves hands. If needed gloves are changed with hand hygiene during patient care time.  PELVIC MMT:   MMT eval  Vaginal 2/5, unable to bulge  Internal Anal Sphincter 3/5  External Anal Sphincter 3/5  Puborectalis   (Blank rows = not tested)        TONE: average  PROLAPSE: Anterior and posterior vaginal wall laxity with stool in rectum  TODAY'S TREATMENT:                                                                                                                              DATE: 02/05/2025 Review of HEP and modified to no pain Review of progress and goals Hip scar cupping Leg press 40 lbs 20 reps trial Chest press cable  8 reps 1 plate Chest press #5 15 reps Knee to chest with towel roll Modified piriformis stretch   01/23/24: Vibration plate for pelvic floor relaxation: Standing with soft bend in knees + diaphragmatic breathing x57min  Standing adductor stretch + diaphragmatic breathing x34min  Standing hamstring stretch + diaphragmatic breathing x77min  Single knee to chest stretch + diaphragmatic breathing 2x49min  Sidelying open book +  diaphragmatic breathing 2x41min  Cat/cow + diaphragmatic breathing 2x57min  Hip flexor at edge of bed stretch + diaphragmatic breathing 2x75min  Lower trunk rotations + diaphragmatic breathing x14min  01/16/24: Supine diaphragmatic breathing + pelvic floor relaxation 2x10  Supine butterfly stretch + diaphragmatic breathing 2x85min  Single knee to chest + diaphragmatic breathing 2x45min  Lower trunk rotation + diaphragmatic breathing 2x10  Figure 4 piriformis stretch + diaphragmatic breathing 2x3min  01/05/2024 Education on fiber ( kiwi), hydration,  Going to try poop within 30 mins for 5 mins with gentle bearing  Right Hip scar cupping Abdominal massage for constipation Education on diaphragmatic breathing   12/29/23 EVAL  Examination completed, findings reviewed, pt educated on POC, HEP, and female pelvic floor anatomy, reasoning with pelvic floor assessment internally with pt consent. Pt motivated to participate in PT and agreeable to attempt recommendations.    PATIENT EDUCATION:  Education details: Pt was educated on relevant anatomy, exam findings, home exercise program, plan of care, expectations of PT and hip scar cupping  Person educated: Patient Education method: Explanation, Demonstration, Tactile cues, Verbal cues, and Handouts Education comprehension: verbalized understanding, returned demonstration, verbal cues required, tactile cues required, and needs further education  HOME EXERCISE PROGRAM: Access Code: [email or web address] URL: [email or web address] Date: 01/23/2024 Prepared by: Celena Domino  Exercises - Seated Diaphragmatic Breathing  -  1 x daily - 7 x weekly - 2 sets - 10 reps - Supine Butterfly Groin Stretch  - 1 x daily - 7 x weekly - 2 sets - hold - Supine Single Knee to Chest Stretch  - 1 x daily - 7 x weekly - 2 sets - hold - Supine Lower Trunk Rotation  - 1 x daily - 7 x weekly - 2 sets - 10 reps - Supine Figure 4 Piriformis Stretch  - 1 x daily -  7 x weekly - 2 sets - hold - Sidelying Thoracic Rotation with Open Book  - 1 x daily - 7 x weekly - 2 sets - 10 reps - Cat Cow  - 1 x daily - 7 x weekly - 2 sets - 10 reps - Hip Flexor Stretch at Edge of Bed  - 1 x daily - 7 x weekly - 2 sets - hold  ASSESSMENT:  CLINICAL IMPRESSION: Patient is a 69 y.o. F who was seen today for physical therapy treatment for right hip pain, pelvic pain and pain with intercourse as well as nocturia and fecal smearing. Patient has global weakness and decreased endurance, right hip scar tenderness. She had avascular necrosis and underwent THA 2 years ago and it is still painful. Treatment session focused on global strengthening, exercise modification and dosing as PT has got weak and wants to return to the gym without increased hip pain. Patient recently saw new urogyne and we discussed her note helping patient understand some terminology. She will benefit from PT to be able have pain free intercourse again, return to the gym, strength and reduce fecal smearing. Patient denied questions at the end of session.   OBJECTIVE IMPAIRMENTS: decreased activity tolerance, decreased coordination, difficulty walking, decreased ROM, decreased strength, increased fascial restrictions, impaired tone, and pain.   ACTIVITY LIMITATIONS: continence, toileting, and intercourse  PARTICIPATION LIMITATIONS: interpersonal relationship, community activity, and community  PERSONAL FACTORS: Time since onset of injury/illness/exacerbation are also affecting patient's functional outcome.   REHAB POTENTIAL: Good  CLINICAL DECISION MAKING: Evolving/moderate complexity  EVALUATION COMPLEXITY: Moderate   GOALS: Goals reviewed with patient? Yes  SHORT TERM GOALS: Target date: 01/26/2024    Patient will be educated on Bowel routine Baseline: Goal status: INITIAL  2.  Educated on abdominal massage Baseline:  Goal status: INITIAL  3.  I with scar cupping Baseline:  Goal  status: met  4.  Urge suppression techniques and healthy bladder habits Baseline:  Goal status: INITIAL  LONG TERM GOALS: Target date: 03/22/2024    Patient will sleep through the night  Baseline: has to get up at least 2 times to urinate - can be 3-4 times Goal status: INITIAL  2.  Patient will have no pain with intercourse Baseline:  Goal status: INITIAL  3.  Patient will have no hip pain Baseline:  Goal status: INITIAL  4.  Patient will have no fecal smearing Baseline:  Goal status: INITIAL  5.  Will be able to do workouts in the gym without hip pain Baseline:  Goal status: INITIAL  6.  Will have right hip MMT 5/5 Baseline:  Goal status: INITIAL  PLAN:  PT FREQUENCY: 1-2x/week  PT DURATION: 12 weeks  PLANNED INTERVENTIONS: 97110-Therapeutic exercises, 97530- Therapeutic activity, 97112- Neuromuscular re-education, 97535- Self Care, 02859- Manual therapy, [phone]- Electrical stimulation (manual), [phone]- Ionotophoresis 4mg /ml Dexamethasone , 79439 (1-2 muscles), 20561 (3+ muscles)- Dry Needling, Patient/Family education, Taping, Joint mobilization, Joint manipulation, Spinal manipulation, Spinal mobilization, Scar mobilization,  Cryotherapy, Moist heat, and Biofeedback  PLAN FOR NEXT SESSION: ortho-cupping right hip scar, strengthening bilateral hips and core- bridges, leg press, review her gym exercises and load  Pelvic- abdominal massage and bowel/ bladder healthy habits  Christion Leonhard, PT 02/06/2024, 10:20 AM  "

## 2024-02-06 ENCOUNTER — Encounter: Payer: Self-pay | Admitting: *Deleted

## 2024-02-06 ENCOUNTER — Encounter: Payer: Self-pay | Admitting: Physical Therapy

## 2024-02-06 ENCOUNTER — Ambulatory Visit: Attending: Obstetrics & Gynecology | Admitting: Physical Therapy

## 2024-02-06 DIAGNOSIS — R278 Other lack of coordination: Secondary | ICD-10-CM | POA: Insufficient documentation

## 2024-02-06 DIAGNOSIS — M6281 Muscle weakness (generalized): Secondary | ICD-10-CM | POA: Diagnosis present

## 2024-02-07 ENCOUNTER — Ambulatory Visit: Payer: Self-pay | Admitting: Obstetrics and Gynecology

## 2024-02-07 DIAGNOSIS — N390 Urinary tract infection, site not specified: Secondary | ICD-10-CM

## 2024-02-07 MED ORDER — CIPROFLOXACIN HCL 500 MG PO TABS: 500.0000 mg | ORAL_TABLET | Freq: Two times a day (BID) | ORAL | 0 refills | Status: AC

## 2024-02-08 ENCOUNTER — Encounter: Payer: Self-pay | Admitting: Obstetrics and Gynecology

## 2024-02-09 ENCOUNTER — Ambulatory Visit: Admitting: Obstetrics and Gynecology

## 2024-02-09 VITALS — BP 119/55 | HR 91

## 2024-02-09 DIAGNOSIS — Z87440 Personal history of urinary (tract) infections: Secondary | ICD-10-CM

## 2024-02-09 DIAGNOSIS — N390 Urinary tract infection, site not specified: Secondary | ICD-10-CM

## 2024-02-09 LAB — POCT URINALYSIS DIP (CLINITEK)
Bilirubin, UA: NEGATIVE
Blood, UA: NEGATIVE
Glucose, UA: NEGATIVE mg/dL
Ketones, POC UA: NEGATIVE mg/dL
Leukocytes, UA: NEGATIVE
Nitrite, UA: NEGATIVE
POC PROTEIN,UA: NEGATIVE
Spec Grav, UA: >=1.03 — AB
Urobilinogen, UA: 0.2 U/dL
pH, UA: 5.5

## 2024-02-09 MED ORDER — LIDOCAINE HCL URETHRAL/MUCOSAL 2 % EX GEL
1.0000 | Freq: Once | CUTANEOUS | Status: AC
  Administered 2024-02-09: 1 via URETHRAL

## 2024-02-09 NOTE — Patient Instructions (Signed)
 Taking Care of Yourself after Urodynamics, Cystoscopy, Bulkamid Injection, or Botox Injection   Drink plenty of water for a day or two following your procedure. Try to have about 8 ounces (one cup) at a time, and do this 6 times or more per day unless you have fluid restrictitons AVOID irritative beverages such as coffee, tea, soda, alcoholic or citrus drinks for a day or two, as this may cause burning with urination.  For the first 1-2 days after the procedure, your urine may be pink or red in color. You may have some blood in your urine as a normal side effect of the procedure. Large amounts of bleeding or difficulty urinating are NOT normal. Call the nurse line if this happens or go to the nearest Emergency Room if the bleeding is heavy or you cannot urinate at all and it is after hours.  You may experience some discomfort or a burning sensation with urination after having this procedure. You can use over the counter Azo or pyridium to help with burning and follow the instructions on the packaging. If it does not improve within 1-2 days, or other symptoms appear (fever, chills, or difficulty urinating) call the office to speak to a nurse.  You may return to normal daily activities such as work, school, driving, exercising and housework on the day of the procedure. If your doctor gave you a prescription, take it as ordered.

## 2024-02-09 NOTE — Progress Notes (Signed)
 CYSTOSCOPY  CC:  This is a 69 y.o. with recurrent UTI and prior history of mesh sling who presents today for cystoscopy.  Results for orders placed or performed in visit on 02/09/24  POCT URINALYSIS DIP (CLINITEK)   Collection Time: 02/09/24  8:31 AM  Result Value Ref Range   Color, UA yellow yellow   Clarity, UA clear clear   Glucose, UA negative negative mg/dL   Bilirubin, UA negative negative   Ketones, POC UA negative negative mg/dL   Spec Grav, UA >=8.969 (A) 1.010 - 1.025   Blood, UA negative negative   pH, UA 5.5 5.0 - 8.0   POC PROTEIN,UA negative negative, trace   Urobilinogen, UA 0.2 0.2 or 1.0 E.U./dL   Nitrite, UA Negative Negative   Leukocytes, UA Negative Negative     BP (!) 119/55   Pulse 91     CYSTOSCOPY: A time out was performed.  The periurethral area was prepped and draped in a sterile manner.  2% lidocaine  jetpack was inserted at the urethral meatus. The urethra and bladder were visualized with flexible cystoscope.  She had normal urethral coaptation and normal urethral mucosa.  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 She had bilateral clear efflux from both ureteral orifices.  She had no squamous metaplasia at the trigone, no trabeculations, cellules or diverticuli.  No evidence of mesh exposure.    ASSESSMENT:  69 y.o. with recurrent UTI. Cystoscopy today is normal. Normal CT 01/14/24  PLAN:  - Complete ciprofloxacin  course for current infection.  - Continue vaginal estrogen cream - Return to office for any UTI symptoms, otherwise will f/u in 5 months.   All questions answered and post-procedures instructions were given  Rosaline LOISE Caper, MD

## 2024-02-09 NOTE — Addendum Note (Signed)
 Addended by: MARILYNNE ROSALINE SAILOR on: 02/09/2024 08:52 AM   Modules accepted: Orders

## 2024-02-10 ENCOUNTER — Encounter: Payer: Self-pay | Admitting: Obstetrics and Gynecology

## 2024-02-13 ENCOUNTER — Ambulatory Visit: Admitting: Physical Therapy

## 2024-02-14 ENCOUNTER — Encounter: Payer: Medicare Other | Admitting: Nurse Practitioner

## 2024-02-21 ENCOUNTER — Encounter: Payer: Self-pay | Admitting: Obstetrics and Gynecology

## 2024-02-21 NOTE — Therapy (Signed)
 " OUTPATIENT PHYSICAL THERAPY FEMALE PELVIC TREATMENT   Patient Name: Patricia Ryan MRN: [id number] DOB:1955-12-02, 69 y.o., female Today's Date: 02/23/2024  END OF SESSION:  PT End of Session - 02/23/24 1459     Visit Number 6    Date for Recertification  03/28/24    Authorization Type [employer] approved 12 visits 12/29/2023 - 03/22/2024 [id number]  [facility] MCR 2026    Authorization Time Period [employer] approved 12 visits 12/29/2023 - 03/22/2024    Authorization - Visit Number 5    Authorization - Number of Visits 12    Progress Note Due on Visit 10    PT Start Time 1447    PT Stop Time 1530    PT Time Calculation (min) 43 min    Activity Tolerance Patient tolerated treatment well    Behavior During Therapy [facility] for tasks assessed/performed               Past Medical History:  Diagnosis Date   Allergy    Altered bowel habits 09/07/2019   ANAL FISSURE, HX OF 09/24/2008   Qualifier: Diagnosis of  By: Claudene CMA, Burnard     Anxiety    Anxiety state 09/24/2008   Qualifier: Diagnosis of   By: Claudene CMA, Burnard         Arthritis    Atypical chest pain 09/07/2019   Cancer (HCC)    skin cancer   Constipation 02/06/2010   Qualifier: Diagnosis of   By: Claudene CMA, Burnard         Fibroids    Uterine   GERD (gastroesophageal reflux disease)    Hepatitis    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   History of kidney stones    HSV-2 (herpes simplex virus 2) infection    Hyperlipidemia    IBS (irritable bowel syndrome)    Interstitial cystitis    Nevus    vulva nevus   Prediabetes    RUQ abdominal pain 09/07/2019   UTI (urinary tract infection) 05/23/2020   Past Surgical History:  Procedure Laterality Date   BUNIONECTOMY     CHOLECYSTECTOMY     COLONOSCOPY     LAPAROSCOPY  1985/ 1990   due to infertility   LITHOTRIPSY     RETROPUBIC SLING  2011   [facility]   TONSILLECTOMY     TOTAL HIP ARTHROPLASTY Right 01/04/2022   Procedure: TOTAL HIP ARTHROPLASTY;  Surgeon: Edna Toribio LABOR, MD;  Location: [facility];  Service: Orthopedics;  Laterality: Right;   tubal reconstruction     tubalplasty  1985/ 1990    UPPER GASTROINTESTINAL ENDOSCOPY     VAGINAL DELIVERY     x2   Patient Active Problem List   Diagnosis Date Noted   Overactive bladder 02/03/2024   Pain of right hip 02/03/2024   History of hepatitis A 12/04/2023   Right groin pain 12/04/2023   Palpitations 07/25/2023   Emphysema lung (HCC) 06/10/2023   Atherosclerosis of aorta 06/10/2023   Pulmonary nodules 06/10/2023   Gastroesophageal reflux disease without esophagitis 04/20/2023   History of borderline diabetes mellitus 04/12/2023   Insomnia 04/12/2023   Hormone replacement therapy (HRT) 04/12/2023   Polyarthralgia 02/01/2021   Osteoporosis 10/21/2020   Recurrent urinary tract infection 05/23/2020   B12 deficiency 02/12/2019   Vitamin D  deficiency 02/12/2019   HSV-2 infection 01/28/2014   IBS (irritable bowel syndrome)    Hyperlipidemia    Fibroids    Chronic interstitial cystitis 11/08/2012  PCP: Lucius Krabbe, NP  REFERRING PROVIDER: Cleotilde Ronal RAMAN, MD  REFERRING DIAG:  N30.10 (ICD-10-CM) - Interstitial cystitis  R10.20 (ICD-10-CM) - Pelvic pain  R10.31 (ICD-10-CM) - Right inguinal pain    THERAPY DIAG:  Muscle weakness (generalized)  Other lack of coordination  Rationale for Evaluation and Treatment: Rehabilitation  ONSET DATE: 12/23  SUBJECTIVE:                                                                                                                                                                                           SUBJECTIVE STATEMENT: Patient returning after 2 weeks. Reports that she feels OK, has  UTI, hip hurts all the time 5/10. Hip has been aggravating her Felt ok after last visit Does some hip exercises at home and has gone to the gym 2x since last PT.  Right groin pain comes and goes Still takes Miralax - still has some fecal smearing when she  tales Miralax  and in Bristol stool scale 7 Does not have any stool softener now, can get it Has not been doing any abdominal massage      Last visit When I started doing a bunch of exercises, it started to make my right hip hurt. Wondering if its gluteal tendinopathy  Saw a urogyne on Friday, she did an internal, did a catheterization, she is figuring out what is going on.  I have an ic She is going to put a camera in down in the bladder Hip is typically 5/10, I am still suffering but I kind of forget I want to go in the gym ( [employer] at least 2x) I don't go for walks I do a lot of chores         Patient reports that she has been taking her miralax  but has not taken the dulcolax/miralax  combination she was recommended. She had bowel movements two days in a row (has not been today). Hip pain is still present but groin pain is much better. She has a urogynecologist appointment scheduled for January 9th in the new year. She has been getting up at least two times per night to get up to pee. 2/10 hip pain today.   Eval: Patient reports that she she she had a hip replacement 2 years ago and it feels it still is not healed, incision very tender. Did PT for that. She has right groin pain and sometimes pelvic pressure in rectal and vaginal area at the same time. 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 Yes NPRS scale: 0-6/10 Pain location: right groin and vaginal pressure at times with it  Pain type: aching and burning Pain description: intermittent   Aggravating factors: intercourse, pain in the hips comes and goes Relieving factors: heat  PRECAUTIONS: None  RED FLAGS: None   WEIGHT BEARING RESTRICTIONS: No  FALLS:  Has patient fallen in last 6 months? No and Yes. Number of falls 2  OCCUPATION: retired  ACTIVITY LEVEL : active, goes to the gym  PLOF: Independent  PATIENT GOALS: to have less pain   BOWEL MOVEMENT: constipation and diarrhea, occasionally miralax  Pain with bowel movement: No Type of bowel movement:Type (Bristol Stool Scale) 2 Fully empty rectum: No Leakage: Yes: sometimes                                                  Caused by: diarrhea Bowel urgency: not all the time Pads: Yes: sometimes pantyliner Fiber supplement/laxative Yes Miralax   URINATION: Pain with urination: Yes when she has  UTI Fully empty bladder: Yes:                                           Post-void dribble: No Stream: Strong Urgency: Yes  Frequency:during the day yes                                                        Nocturia: Yes: 3-4   Leakage: none Pads/briefs: No  INTERCOURSE:  Ability to have vaginal penetration No  Pain with intercourse: yes Dryness: yes Climax: no Marinoff Scale: 3/3 Lubricant:  PREGNANCY:  Vaginal deliveries 2 Tearing Yes:  3-4 degree Episiotomy yes C-section deliveries no Currently pregnant No  PROLAPSE: Pressure   OBJECTIVE:  Note: Objective measures were completed at Evaluation unless otherwise noted.  DIAGNOSTIC FINDINGS:  Post-void residual: Voiding Cystourethrogram (VCUG):  Ultrasound:   PATIENT SURVEYS:    PFIQ-7: 90 UIQ-7 33 CRAIG -7 29 POPIQ-7 29 Female Sexual Function Index (FSFI)  Questionnaire   COGNITION: Overall cognitive status: Within functional limits for tasks assessed     SENSATION: Light touch: Appears intact  LUMBAR SPECIAL TESTS:  Straight leg raise test: Positive on right for weakness right hip  FUNCTIONAL TESTS: to be checked  Single leg stance:  Rt:  Lt: Sit-up test: Squat: Bed mobility:  GAIT: Assistive device utilized: None Comments: mildly antalgic  POSTURE: No Significant postural limitations   LUMBARAROM/PROM:  A/PROM A/PROM  Eval (% available)  Flexion   Extension   Right lateral flexion   Left lateral flexion   Right rotation   Left rotation    (Blank rows = not tested)  LOWER EXTREMITY ROM: full   LOWER EXTREMITY FFU:mphyu hip 4-/5  PALPATION:  General: right hip scar tender  Pelvic Alignment: even  Abdominal:   Diastasis: No Distortion: No  Breathing: upper chest Scar tissue: Yes: right hip Active Straight Leg Raise: right- positive for weakness                External Perineal Exam: mild dryness                             Internal Pelvic Floor: anterior and posterior vaginal wall laxity, unable to bulge vaginally, able to rectally  Patient confirms identification and approves PT to assess internal pelvic floor and treatment Yes All internal or external pelvic floor assessments and/or treatments are completed with proper hand hygiene and gloves hands. If needed gloves are changed with hand hygiene during patient care time.  PELVIC MMT:   MMT eval  Vaginal 2/5, unable to bulge  Internal Anal Sphincter 3/5  External Anal Sphincter 3/5  Puborectalis   (Blank rows = not tested)        TONE: average  PROLAPSE: Anterior and posterior vaginal wall laxity with stool in rectum  TODAY'S TREATMENT:                                                                                                                              DATE: 02/23/2024 Review of progress and goals Education on urge suppression   Reevaluation completed QL stretch supine Manual- soft tissue technique review and demonstrated right hip scar Education on interstitial cystitis book Education on abdominal massage   02/05/2025 Review of HEP and modified to no pain Review of progress and goals Hip scar cupping Leg press 40 lbs 20 reps trial Chest press cable  8 reps 1 plate Chest press #5 15 reps Knee to chest with towel roll Modified piriformis stretch   01/23/24: Vibration plate for pelvic floor relaxation: Standing with soft bend in knees + diaphragmatic breathing x51min  Standing adductor stretch + diaphragmatic breathing x58min  Standing hamstring stretch + diaphragmatic breathing x50min  Single knee to chest stretch + diaphragmatic breathing 2x52min  Sidelying open book + diaphragmatic breathing 2x75min  Cat/cow + diaphragmatic breathing 2x11min  Hip flexor at edge of bed stretch + diaphragmatic breathing 2x82min  Lower trunk rotations + diaphragmatic breathing x8min  01/16/24: Supine diaphragmatic breathing + pelvic floor relaxation 2x10  Supine butterfly stretch + diaphragmatic breathing 2x60min  Single knee to chest + diaphragmatic breathing 2x28min  Lower trunk rotation + diaphragmatic breathing 2x10  Figure 4 piriformis stretch + diaphragmatic breathing 2x79min  01/05/2024 Education on fiber ( kiwi), hydration,  Going to try poop within 30 mins for 5 mins  with gentle bearing  Right Hip scar cupping Abdominal massage for constipation Education on diaphragmatic breathing   12/29/23 EVAL  Examination completed, findings reviewed, pt educated on POC, HEP, and female pelvic floor anatomy, reasoning with pelvic floor assessment internally with pt consent. Pt motivated to participate in PT and agreeable to attempt recommendations.    PATIENT EDUCATION:  Education details: Pt was educated on relevant anatomy, exam findings, home exercise program, plan of care, expectations of PT and hip scar cupping  Person  educated: Patient Education method: Explanation, Demonstration, Tactile cues, Verbal cues, and Handouts Education comprehension: verbalized understanding, returned demonstration, verbal cues required, tactile cues required, and needs further education  HOME EXERCISE PROGRAM: Access Code: [email or web address] URL: [email or web address] Date: 01/23/2024 Prepared by: Celena Domino  Exercises - Seated Diaphragmatic Breathing  - 1 x daily - 7 x weekly - 2 sets - 10 reps - Supine Butterfly Groin Stretch  - 1 x daily - 7 x weekly - 2 sets - hold - Supine Single Knee to Chest Stretch  - 1 x daily - 7 x weekly - 2 sets - hold - Supine Lower Trunk Rotation  - 1 x daily - 7 x weekly - 2 sets - 10 reps - Supine Figure 4 Piriformis Stretch  - 1 x daily - 7 x weekly - 2 sets - hold - Sidelying Thoracic Rotation with Open Book  - 1 x daily - 7 x weekly - 2 sets - 10 reps - Cat Cow  - 1 x daily - 7 x weekly - 2 sets - 10 reps - Hip Flexor Stretch at Edge of Bed  - 1 x daily - 7 x weekly - 2 sets - hold  ASSESSMENT:  CLINICAL IMPRESSION: Patient still with some right hip pain. Some guarding present. Bristol stool scale 7 with Miralax . Patient to do a bowel diary to identify how much fiber she might need in her diet. She is on ATB now for UTI. Patient progressing slowly towards goals. Stated that she has not tried Linzess  or completed SIBO test. She reported that she is not sure what exercises to do and that it is frustrating that she has so many things wrong with her. She will benefit from PT to improve right hip strength and reduce constipation and pelvic pain. Patient educated on Interstitial cystitic solution book. Patient to bring breath test instructions to help understand better.      Last visit Patient is a 69 y.o. F who was seen today for physical therapy treatment for right hip pain, pelvic pain and pain with intercourse as well as nocturia and fecal smearing. Patient has  global weakness and decreased endurance, right hip scar tenderness. She had avascular necrosis and underwent THA 2 years ago and it is still painful. Treatment session focused on global strengthening, exercise modification and dosing as PT has got weak and wants to return to the gym without increased hip pain. Patient recently saw new urogyne and we discussed her note helping patient understand some terminology. She will benefit from PT to be able have pain free intercourse again, return to the gym, strength and reduce fecal smearing. Patient denied questions at the end of session.   OBJECTIVE IMPAIRMENTS: decreased activity tolerance, decreased coordination, difficulty walking, decreased ROM, decreased strength, increased fascial restrictions, impaired tone, and pain.   ACTIVITY LIMITATIONS: continence, toileting, and intercourse  PARTICIPATION LIMITATIONS: interpersonal relationship, community activity, and community  PERSONAL FACTORS: Time since onset of injury/illness/exacerbation are  also affecting patient's functional outcome.   REHAB POTENTIAL: Good  CLINICAL DECISION MAKING: Evolving/moderate complexity  EVALUATION COMPLEXITY: Moderate   GOALS: Goals reviewed with patient? Yes  SHORT TERM GOALS: Target date: 01/26/2024    Patient will be educated on Bowel routine Baseline: Goal status: met 02/23/2024  2.  Educated on abdominal massage Baseline:  Goal status: met 02/23/24  3.  I with scar cupping Baseline:  Goal status: met  4.  Urge suppression techniques and healthy bladder habits Baseline:  Goal status: met 02/23/2024  LONG TERM GOALS: Target date: 03/22/2024    Patient will sleep through the night  Baseline: has to get up at least 2 times to urinate - can be 3-4 times Goal status: not yet  2.  Patient will have no pain with intercourse Baseline:  Goal status: has not had intercourse 02/23/2024  3.  Patient will have no hip pain Baseline:  Goal status: 5/10  02/23/24  4.  Patient will have no fecal smearing Baseline:  Goal status: still has some when she has diarrhea when on Miralax  02/23/2024  5.  Will be able to do workouts in the gym without hip pain Baseline:  Goal status: INITIAL  6.  Will have right hip MMT 5/5 Baseline:  Goal status: INITIAL  PLAN:  PT FREQUENCY: 1-2x/week  PT DURATION: 12 weeks  PLANNED INTERVENTIONS: 97110-Therapeutic exercises, 97530- Therapeutic activity, 97112- Neuromuscular re-education, 97535- Self Care, [id number]- Manual therapy, [phone]- Electrical stimulation (manual), [phone]- Ionotophoresis 4mg /ml Dexamethasone , 79439 (1-2 muscles), [id number] (3+ muscles)- Dry Needling, Patient/Family education, Taping, Joint mobilization, Joint manipulation, Spinal manipulation, Spinal mobilization, Scar mobilization, Cryotherapy, Moist heat, and Biofeedback  PLAN FOR NEXT SESSION: ortho-cupping right hip scar, strengthening bilateral hips and core- bridges, leg press, review her gym exercises and load  Pelvic- abdominal massage and teach bowel/ bladder healthy habits  Kizer Nobbe, PT 02/23/2024, 5:17 PM  "

## 2024-02-23 ENCOUNTER — Encounter: Payer: Self-pay | Admitting: Physical Therapy

## 2024-02-23 ENCOUNTER — Ambulatory Visit: Admitting: Physical Therapy

## 2024-02-23 DIAGNOSIS — R278 Other lack of coordination: Secondary | ICD-10-CM

## 2024-02-23 DIAGNOSIS — M6281 Muscle weakness (generalized): Secondary | ICD-10-CM | POA: Diagnosis not present

## 2024-02-29 NOTE — Therapy (Signed)
 " OUTPATIENT PHYSICAL THERAPY FEMALE PELVIC TREATMENT   Patient Name: Patricia Ryan MRN: [id number] DOB:[birth year], 69 y.o., female Today's Date: 03/01/2024  END OF SESSION:  PT End of Session - 03/01/24 1443     Visit Number 7    Date for Recertification  03/28/24    Authorization Type [employer] approved 12 visits 12/29/2023 - 03/22/2024 [id number]  [facility] MCR 2026    Authorization Time Period [employer] approved 12 visits 12/29/2023 - 03/22/2024    Authorization - Visit Number 6    Authorization - Number of Visits 12    Progress Note Due on Visit 10    PT Start Time 1443    PT Stop Time 1530    PT Time Calculation (min) 47 min    Activity Tolerance Patient tolerated treatment well    Behavior During Therapy [facility] for tasks assessed/performed                Past Medical History:  Diagnosis Date   Allergy    Altered bowel habits 09/07/2019   ANAL FISSURE, HX OF 09/24/2008   Qualifier: Diagnosis of  By: Claudene CMA, Burnard     Anxiety    Anxiety state 09/24/2008   Qualifier: Diagnosis of   By: Claudene CMA, Burnard         Arthritis    Atypical chest pain 09/07/2019   Cancer (HCC)    skin cancer   Constipation 02/06/2010   Qualifier: Diagnosis of   By: Claudene CMA, Burnard         Fibroids    Uterine   GERD (gastroesophageal reflux disease)    Hepatitis    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   History of kidney stones    HSV-2 (herpes simplex virus 2) infection    Hyperlipidemia    IBS (irritable bowel syndrome)    Interstitial cystitis    Nevus    vulva nevus   Prediabetes    RUQ abdominal pain 09/07/2019   UTI (urinary tract infection) 05/23/2020   Past Surgical History:  Procedure Laterality Date   BUNIONECTOMY     CHOLECYSTECTOMY     COLONOSCOPY     LAPAROSCOPY  1985/ 1990   due to infertility   LITHOTRIPSY     RETROPUBIC SLING  2011   [facility]   TONSILLECTOMY     TOTAL HIP ARTHROPLASTY Right 01/04/2022   Procedure: TOTAL HIP ARTHROPLASTY;  Surgeon:  Edna Toribio LABOR, MD;  Location: [facility];  Service: Orthopedics;  Laterality: Right;   tubal reconstruction     tubalplasty  1985/ 1990    UPPER GASTROINTESTINAL ENDOSCOPY     VAGINAL DELIVERY     x2   Patient Active Problem List   Diagnosis Date Noted   Overactive bladder 02/03/2024   Pain of right hip 02/03/2024   History of hepatitis A 12/04/2023   Right groin pain 12/04/2023   Palpitations 07/25/2023   Emphysema lung (HCC) 06/10/2023   Atherosclerosis of aorta 06/10/2023   Pulmonary nodules 06/10/2023   Gastroesophageal reflux disease without esophagitis 04/20/2023   History of borderline diabetes mellitus 04/12/2023   Insomnia 04/12/2023   Hormone replacement therapy (HRT) 04/12/2023   Polyarthralgia 02/01/2021   Osteoporosis 10/21/2020   Recurrent urinary tract infection 05/23/2020   B12 deficiency 02/12/2019   Vitamin D  deficiency 02/12/2019   HSV-2 infection 01/28/2014   IBS (irritable bowel syndrome)    Hyperlipidemia    Fibroids    Chronic interstitial cystitis 11/08/2012  PCP: Lucius Krabbe, NP  REFERRING PROVIDER: Cleotilde Ronal RAMAN, MD  REFERRING DIAG:  N30.10 (ICD-10-CM) - Interstitial cystitis  R10.20 (ICD-10-CM) - Pelvic pain  R10.31 (ICD-10-CM) - Right inguinal pain    THERAPY DIAG:  Muscle weakness (generalized)  Other lack of coordination  Rationale for Evaluation and Treatment: Rehabilitation  ONSET DATE: 12/23  SUBJECTIVE:                                                                                                                                                                                           SUBJECTIVE STATEMENT: Patient reports that that she is off Miralax  and taking Metamucil, she has been doing that and a stool softener She tried a dulcolax and has been going to the bathroom since then She might still have UTI, had a lot of pain and had to recline with some heat No pain today Bringing SIBO test picture Doing  exercises at home       Patient returning after 2 weeks. Reports that she feels OK, has  UTI, hip hurts all the time 5/10. Hip has been aggravating her Felt ok after last visit Does some hip exercises at home and has gone to the gym 2x since last PT.  Right groin pain comes and goes Still takes Miralax - still has some fecal smearing when she tales Miralax  and in Bristol stool scale 7 Does not have any stool softener now, can get it Has not been doing any abdominal massage      Last visit When I started doing a bunch of exercises, it started to make my right hip hurt. Wondering if its gluteal tendinopathy  Saw a urogyne on Friday, she did an internal, did a catheterization, she is figuring out what is going on.  I have an ic She is going to put a camera in down in the bladder Hip is typically 5/10, I am still suffering but I kind of forget I want to go in the gym ( [employer] at least 2x) I don't go for walks I do a lot of chores         Patient reports that she has been taking her miralax  but has not taken the dulcolax/miralax  combination she was recommended. She had bowel movements two days in a row (has not been today). Hip pain is still present but groin pain is much better. She has a urogynecologist appointment scheduled for January 9th in the new year. She has been getting up at least two times per night to get up to pee. 2/10 hip pain today.   Eval: Patient reports that she she she had  a hip replacement 2 years ago and it feels it still is not healed, incision very tender. Did PT for that. She has right groin pain and sometimes pelvic pressure in rectal and vaginal area at the same time. 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 Yes NPRS scale: 0-6/10 Pain location: right groin and vaginal pressure at times with it  Pain type: aching and burning Pain description: intermittent   Aggravating factors: intercourse, pain in the hips comes and goes Relieving factors: heat  PRECAUTIONS: None  RED FLAGS: None   WEIGHT BEARING RESTRICTIONS: No  FALLS:  Has patient fallen in last 6 months? No and Yes. Number of falls 2  OCCUPATION: retired  ACTIVITY LEVEL : active, goes to the gym  PLOF: Independent  PATIENT GOALS: to have less pain   BOWEL MOVEMENT: constipation and diarrhea, occasionally miralax  Pain with bowel movement: No Type of bowel movement:Type (Bristol Stool Scale) 2 Fully empty rectum: No Leakage: Yes: sometimes                                                  Caused by: diarrhea Bowel urgency: not all the time Pads: Yes: sometimes pantyliner Fiber supplement/laxative Yes Miralax   URINATION: Pain with urination: Yes when she has  UTI Fully empty bladder: Yes:                                           Post-void dribble: No Stream: Strong Urgency: Yes  Frequency:during the day yes                                                        Nocturia: Yes: 3-4   Leakage: none Pads/briefs: No  INTERCOURSE:  Ability to have vaginal penetration No  Pain with intercourse: yes Dryness: yes Climax: no Marinoff Scale: 3/3 Lubricant:  PREGNANCY:  Vaginal deliveries 2 Tearing Yes:  3-4 degree Episiotomy yes C-section deliveries  no Currently pregnant No  PROLAPSE: Pressure   OBJECTIVE:  Note: Objective measures were completed at Evaluation unless otherwise noted.  DIAGNOSTIC FINDINGS:  Post-void residual: Voiding Cystourethrogram (VCUG):  Ultrasound:   PATIENT SURVEYS:    PFIQ-7: 90 UIQ-7 33 CRAIG -7 29 POPIQ-7 29 Female Sexual Function Index (FSFI) Questionnaire   COGNITION: Overall cognitive status: Within functional limits for tasks  assessed     SENSATION: Light touch: Appears intact  LUMBAR SPECIAL TESTS:  Straight leg raise test: Positive on right for weakness right hip  FUNCTIONAL TESTS: to be checked  Single leg stance:  Rt:  Lt: Sit-up test: Squat: Bed mobility:  GAIT: Assistive device utilized: None Comments: mildly antalgic  POSTURE: No Significant postural limitations   LUMBARAROM/PROM:  A/PROM A/PROM  Eval (% available)  Flexion   Extension   Right lateral flexion   Left lateral flexion   Right rotation   Left rotation    (Blank rows = not tested)  LOWER EXTREMITY ROM: full   LOWER EXTREMITY FFU:mphyu hip 4-/5  PALPATION:  General: right hip scar tender  Pelvic Alignment: even  Abdominal:   Diastasis: No Distortion: No  Breathing: upper chest Scar tissue: Yes: right hip Active Straight Leg Raise: right- positive for weakness                External Perineal Exam: mild dryness                             Internal Pelvic Floor: anterior and posterior vaginal wall laxity, unable to bulge vaginally, able to rectally  Patient confirms identification and approves PT to assess internal pelvic floor and treatment Yes All internal or external pelvic floor assessments and/or treatments are completed with proper hand hygiene and gloves hands. If needed gloves are changed with hand hygiene during patient care time.  PELVIC MMT:   MMT eval  Vaginal 2/5, unable to bulge  Internal Anal Sphincter 3/5  External Anal Sphincter 3/5  Puborectalis   (Blank  rows = not tested)        TONE: average  PROLAPSE: Anterior and posterior vaginal wall laxity with stool in rectum  TODAY'S TREATMENT:                                                                                                                              DATE: Review of progress and goals Abdominal massage and cupping Diaphragmatic breathing reed      02/23/2024 Review of progress and goals Education on urge suppression  Reevaluation completed QL stretch supine Manual- soft tissue technique review and demonstrated right hip scar Education on interstitial cystitis book Education on abdominal massage   02/05/2025 Review of HEP and modified to no pain Review of progress and goals Hip scar cupping Leg press 40 lbs 20 reps trial Chest press cable  8 reps 1 plate Chest press #5 15 reps Knee to chest with towel roll Modified piriformis stretch   01/23/24: Vibration plate for pelvic floor relaxation: Standing with soft bend in knees + diaphragmatic breathing x40min  Standing adductor stretch + diaphragmatic breathing x63min  Standing hamstring stretch + diaphragmatic breathing x47min  Single knee to chest stretch + diaphragmatic breathing 2x73min  Sidelying open book + diaphragmatic breathing 2x29min  Cat/cow + diaphragmatic breathing  2x1min  Hip flexor at edge of bed stretch + diaphragmatic breathing 2x76min  Lower trunk rotations + diaphragmatic breathing x31min  01/16/24: Supine diaphragmatic breathing + pelvic floor relaxation 2x10  Supine butterfly stretch + diaphragmatic breathing 2x96min  Single knee to chest + diaphragmatic breathing 2x37min  Lower trunk rotation + diaphragmatic breathing 2x10  Figure 4 piriformis stretch + diaphragmatic breathing 2x66min  01/05/2024 Education on fiber ( kiwi), hydration,  Going to try poop within 30 mins for 5 mins with gentle bearing  Right Hip scar cupping Abdominal massage for constipation Education on diaphragmatic  breathing   12/29/23 EVAL  Examination completed, findings reviewed, pt educated on POC, HEP, and female pelvic floor anatomy, reasoning with pelvic floor assessment internally with pt consent. Pt motivated to participate in PT and agreeable to attempt recommendations.    PATIENT EDUCATION:  Education details: Pt was educated on relevant anatomy, exam findings, home exercise program, plan of care, expectations of PT and hip scar cupping  Person educated: Patient Education method: Explanation, Demonstration, Tactile cues, Verbal cues, and Handouts Education comprehension: verbalized understanding, returned demonstration, verbal cues required, tactile cues required, and needs further education  HOME EXERCISE PROGRAM: Access Code: [email or web address] URL: [email or web address] Date: 01/23/2024 Prepared by: Celena Domino  Exercises - Seated Diaphragmatic Breathing  - 1 x daily - 7 x weekly - 2 sets - 10 reps - Supine Butterfly Groin Stretch  - 1 x daily - 7 x weekly - 2 sets - hold - Supine Single Knee to Chest Stretch  - 1 x daily - 7 x weekly - 2 sets - hold - Supine Lower Trunk Rotation  - 1 x daily - 7 x weekly - 2 sets - 10 reps - Supine Figure 4 Piriformis Stretch  - 1 x daily - 7 x weekly - 2 sets - hold - Sidelying Thoracic Rotation with Open Book  - 1 x daily - 7 x weekly - 2 sets - 10 reps - Cat Cow  - 1 x daily - 7 x weekly - 2 sets - 10 reps - Hip Flexor Stretch at Edge of Bed  - 1 x daily - 7 x weekly - 2 sets - hold  ASSESSMENT:  CLINICAL IMPRESSION: Patient still with some right abdominal pain. Some rectus abdominis trigger points. Bristol stool scale 4 this week. Identified she has a lot of stress in her marriage. Does not have much appetite, does not eat much. Feels full a lot.  Did well with abdominal massage. Had reduced LLQ pain. Patient progressing slowly towards goals. She will benefit from PT to improve right hip strength and reduce constipation  and pelvic pain.    Last visit Patient is a 69 y.o. F who was seen today for physical therapy treatment for right hip pain, pelvic pain and pain with intercourse as well as nocturia and fecal smearing. Patient has global weakness and decreased endurance, right hip scar tenderness. She had avascular necrosis and underwent THA 2 years ago and it is still painful. Treatment session focused on global strengthening, exercise modification and dosing as PT has got weak and wants to return to the gym without increased hip pain. Patient recently saw new urogyne and we discussed her note helping patient understand some terminology. She will benefit from PT to be able have pain free intercourse again, return to the gym, strength and reduce fecal smearing. Patient denied questions at the end of session.   OBJECTIVE IMPAIRMENTS: decreased activity tolerance, decreased  coordination, difficulty walking, decreased ROM, decreased strength, increased fascial restrictions, impaired tone, and pain.   ACTIVITY LIMITATIONS: continence, toileting, and intercourse  PARTICIPATION LIMITATIONS: interpersonal relationship, community activity, and community  PERSONAL FACTORS: Time since onset of injury/illness/exacerbation are also affecting patient's functional outcome.   REHAB POTENTIAL: Good  CLINICAL DECISION MAKING: Evolving/moderate complexity  EVALUATION COMPLEXITY: Moderate   GOALS: Goals reviewed with patient? Yes  SHORT TERM GOALS: Target date: 01/26/2024    Patient will be educated on Bowel routine Baseline: Goal status: met 02/23/2024  2.  Educated on abdominal massage Baseline:  Goal status: met 02/23/24  3.  I with scar cupping Baseline:  Goal status: met  4.  Urge suppression techniques and healthy bladder habits Baseline:  Goal status: met 02/23/2024  LONG TERM GOALS: Target date: 03/22/2024    Patient will sleep through the night  Baseline: has to get up at least 2 times to urinate - can  be 3-4 times Goal status: not yet  2.  Patient will have no pain with intercourse Baseline:  Goal status: has not had intercourse 02/23/2024  3.  Patient will have no hip pain Baseline:  Goal status: 5/10 02/23/24  4.  Patient will have no fecal smearing Baseline:  Goal status: still has some when she has diarrhea when on Miralax  02/23/2024  5.  Will be able to do workouts in the gym without hip pain Baseline:  Goal status: INITIAL  6.  Will have right hip MMT 5/5 Baseline:  Goal status: INITIAL  PLAN:  PT FREQUENCY: 1-2x/week  PT DURATION: 12 weeks  PLANNED INTERVENTIONS: 97110-Therapeutic exercises, 97530- Therapeutic activity, 97112- Neuromuscular re-education, 97535- Self Care, [id number]- Manual therapy, [phone]- Electrical stimulation (manual), [phone]- Ionotophoresis 4mg /ml Dexamethasone , 79439 (1-2 muscles), 20561 (3+ muscles)- Dry Needling, Patient/Family education, Taping, Joint mobilization, Joint manipulation, Spinal manipulation, Spinal mobilization, Scar mobilization, Cryotherapy, Moist heat, and Biofeedback  PLAN FOR NEXT SESSION: ortho-cupping right hip scar, strengthening bilateral hips and core- bridges, leg press, review her gym exercises and load  Pelvic- abdominal massage and teach bowel/ bladder healthy habits  Jacaria Colburn, PT 03/01/2024, 2:44 PM  "

## 2024-03-01 ENCOUNTER — Ambulatory Visit: Admitting: Physical Therapy

## 2024-03-01 ENCOUNTER — Encounter: Payer: Self-pay | Admitting: Physical Therapy

## 2024-03-01 DIAGNOSIS — R278 Other lack of coordination: Secondary | ICD-10-CM

## 2024-03-01 DIAGNOSIS — M6281 Muscle weakness (generalized): Secondary | ICD-10-CM

## 2024-03-02 ENCOUNTER — Ambulatory Visit

## 2024-03-02 ENCOUNTER — Other Ambulatory Visit (HOSPITAL_COMMUNITY): Admission: RE | Admit: 2024-03-02 | Source: Other Acute Inpatient Hospital

## 2024-03-02 VITALS — BP 130/51 | HR 85

## 2024-03-02 DIAGNOSIS — N390 Urinary tract infection, site not specified: Secondary | ICD-10-CM

## 2024-03-02 LAB — POCT URINALYSIS DIP (CLINITEK)
Bilirubin, UA: NEGATIVE
Blood, UA: NEGATIVE
Glucose, UA: NEGATIVE mg/dL
Ketones, POC UA: NEGATIVE mg/dL
Leukocytes, UA: NEGATIVE
Nitrite, UA: NEGATIVE
POC PROTEIN,UA: NEGATIVE
Spec Grav, UA: 1.01
Urobilinogen, UA: 0.2 U/dL
pH, UA: 5.5

## 2024-03-02 NOTE — Patient Instructions (Signed)
 We have sent your urine out for additional testing. In in office test was negative, so we have not sent in antibiotics, we will await your final results.  Please call if you continue to experience persistent or worsening urinary symptoms such as fever > 100.4, nausea/vomiting, one sided back pain or blood in your urine.    It was a pleasure to see you today!  Thank you for trusting me with your care!

## 2024-03-02 NOTE — Progress Notes (Unsigned)
 Patricia Ryan arrived today with lower abdominal pain. Patient is not experiencing fever, unstable vitals and/or one-sided back flank pain. Patient has had had a recent hospitalization due to UTI.  Last visit in the office was 02/09/2024.  Per protocol:   The most recent Urinalysis completed on 02-21-2024 and normal.  Last Creatinine level  Lab Results  Component Value Date   CREATININE 0.54 01/14/2024    An urine specimen was collected and POCT urinalysis completed.  [] A cath specimen was collected due to patient's current condition, symptoms or post-procedural state.  Total urine output by catheter is  Output by Drain (mL) 02/29/24 0701 - 02/29/24 1900 02/29/24 1901 - 03/01/24 0700 03/01/24 0701 - 03/01/24 1900 03/01/24 1901 - 03/02/24 0700 03/02/24 0701 - 03/02/24 1305  Patient has no LDAs of requested type attached.    Patricia Ryan    POCT Urine results is normal.  Urine micro was not sent per protocol for abnormal urinalysis.  Urine culture was sent per protocol for abnormal urinalysis.     [] Pt was notified of positive urine results and plan for additional urine testing. We will contact you within the next 3-4 days with these results.  [x] No Prescription was sent to your pharmacy.  The additional testing will indicate if a prescription is needed.   [] Patient was notified of abnormal urine results. The following prescription is sent to your preferred pharmacy.  []  Macrobid  100mg  #10 1 tablet by mouth twice daily with food for 5 days      []  Bactrim  DS 800-160mg  #6 1 tablet by mouth twice daily for 3 days        []  Due to your current medication allergies, an alternate prescription was discussed with your provider and will be prescribed and sent to your pharmacy.  [] You can take over the counter AZO two tablets up to three times a day for two days.  Take AZO tablets with a full glass of water . AZO will turn your urine orange, this is normal.   [x] The patient was notified of negative urine results.   If symptoms persist, you may take over the counter AZO two tablets up to three times a day for two days.  AZO will turn your urine orange, this is normal.  Contact the office back to schedule an appointment if your symptoms persist or worsen or you develop additional symptoms.       CC'd note to patient's provider.

## 2024-03-06 ENCOUNTER — Ambulatory Visit: Admitting: Physical Therapy

## 2024-03-13 ENCOUNTER — Ambulatory Visit: Admitting: Physical Therapy

## 2024-03-20 ENCOUNTER — Ambulatory Visit: Admitting: Physical Therapy

## 2024-04-19 ENCOUNTER — Encounter: Admitting: Family

## 2024-04-23 ENCOUNTER — Ambulatory Visit (HOSPITAL_BASED_OUTPATIENT_CLINIC_OR_DEPARTMENT_OTHER): Admitting: Obstetrics & Gynecology

## 2024-09-04 ENCOUNTER — Ambulatory Visit
